# Patient Record
Sex: Male | Born: 1940 | Race: White | Hispanic: No | Smoking: Former smoker
Health system: Southern US, Community
[De-identification: ages and names within clinical notes are randomized; demographics above are authoritative.]

## PROBLEM LIST (undated history)

## (undated) DIAGNOSIS — G8929 Other chronic pain: Secondary | ICD-10-CM

## (undated) DIAGNOSIS — R002 Palpitations: Secondary | ICD-10-CM

## (undated) DIAGNOSIS — I219 Acute myocardial infarction, unspecified: Secondary | ICD-10-CM

## (undated) DIAGNOSIS — I495 Sick sinus syndrome: Secondary | ICD-10-CM

## (undated) DIAGNOSIS — I209 Angina pectoris, unspecified: Secondary | ICD-10-CM

## (undated) DIAGNOSIS — Z950 Presence of cardiac pacemaker: Secondary | ICD-10-CM

## (undated) DIAGNOSIS — N400 Enlarged prostate without lower urinary tract symptoms: Secondary | ICD-10-CM

## (undated) DIAGNOSIS — I10 Essential (primary) hypertension: Secondary | ICD-10-CM

## (undated) DIAGNOSIS — K589 Irritable bowel syndrome without diarrhea: Secondary | ICD-10-CM

## (undated) DIAGNOSIS — H269 Unspecified cataract: Secondary | ICD-10-CM

## (undated) DIAGNOSIS — N529 Male erectile dysfunction, unspecified: Secondary | ICD-10-CM

## (undated) DIAGNOSIS — E785 Hyperlipidemia, unspecified: Secondary | ICD-10-CM

## (undated) DIAGNOSIS — E65 Localized adiposity: Secondary | ICD-10-CM

## (undated) DIAGNOSIS — I4891 Unspecified atrial fibrillation: Secondary | ICD-10-CM

## (undated) DIAGNOSIS — K219 Gastro-esophageal reflux disease without esophagitis: Secondary | ICD-10-CM

## (undated) DIAGNOSIS — I499 Cardiac arrhythmia, unspecified: Secondary | ICD-10-CM

## (undated) DIAGNOSIS — Z862 Personal history of diseases of the blood and blood-forming organs and certain disorders involving the immune mechanism: Secondary | ICD-10-CM

## (undated) DIAGNOSIS — D649 Anemia, unspecified: Secondary | ICD-10-CM

## (undated) DIAGNOSIS — Z860100 Personal history of colon polyps, unspecified: Secondary | ICD-10-CM

## (undated) DIAGNOSIS — M549 Dorsalgia, unspecified: Secondary | ICD-10-CM

## (undated) DIAGNOSIS — M1990 Unspecified osteoarthritis, unspecified site: Secondary | ICD-10-CM

## (undated) DIAGNOSIS — I2510 Atherosclerotic heart disease of native coronary artery without angina pectoris: Secondary | ICD-10-CM

## (undated) HISTORY — DX: Unspecified atrial fibrillation: I48.91

## (undated) HISTORY — DX: Anemia, unspecified: D64.9

## (undated) HISTORY — DX: Benign prostatic hyperplasia without lower urinary tract symptoms: N40.0

## (undated) HISTORY — DX: Gastro-esophageal reflux disease without esophagitis: K21.9

## (undated) HISTORY — DX: Localized adiposity: E65

## (undated) HISTORY — DX: Personal history of diseases of the blood and blood-forming organs and certain disorders involving the immune mechanism: Z86.2

## (undated) HISTORY — DX: Personal history of colon polyps, unspecified: Z86.0100

## (undated) HISTORY — DX: Sick sinus syndrome: I49.5

## (undated) HISTORY — DX: Male erectile dysfunction, unspecified: N52.9

## (undated) HISTORY — DX: Irritable bowel syndrome, unspecified: K58.9

## (undated) HISTORY — DX: Essential (primary) hypertension: I10

## (undated) HISTORY — DX: Unspecified cataract: H26.9

## (undated) HISTORY — DX: Palpitations: R00.2

## (undated) HISTORY — DX: Atherosclerotic heart disease of native coronary artery without angina pectoris: I25.10

## (undated) HISTORY — PX: EYE SURGERY: SHX253

## (undated) HISTORY — DX: Hyperlipidemia, unspecified: E78.5

## (undated) HISTORY — DX: Dorsalgia, unspecified: M54.9

## (undated) HISTORY — DX: Other chronic pain: G89.29

## (undated) HISTORY — DX: Personal history of colonic polyps: Z86.010

## (undated) HISTORY — PX: CATARACT EXTRACTION W/ INTRAOCULAR LENS  IMPLANT, BILATERAL: SHX1307

---

## 1976-01-31 HISTORY — PX: CHOLECYSTECTOMY: SHX55

## 2003-01-31 HISTORY — PX: CORONARY ARTERY BYPASS GRAFT: SHX141

## 2003-01-31 HISTORY — PX: CARDIAC CATHETERIZATION: SHX172

## 2003-12-31 DIAGNOSIS — I2510 Atherosclerotic heart disease of native coronary artery without angina pectoris: Secondary | ICD-10-CM

## 2003-12-31 HISTORY — DX: Atherosclerotic heart disease of native coronary artery without angina pectoris: I25.10

## 2005-09-21 ENCOUNTER — Ambulatory Visit: Payer: Self-pay | Admitting: Internal Medicine

## 2006-01-12 ENCOUNTER — Ambulatory Visit: Payer: Self-pay | Admitting: Internal Medicine

## 2006-01-19 ENCOUNTER — Ambulatory Visit: Payer: Self-pay | Admitting: Internal Medicine

## 2006-03-13 ENCOUNTER — Ambulatory Visit: Payer: Self-pay | Admitting: Internal Medicine

## 2006-03-27 ENCOUNTER — Ambulatory Visit: Payer: Self-pay

## 2006-03-27 ENCOUNTER — Encounter: Payer: Self-pay | Admitting: Internal Medicine

## 2006-07-11 ENCOUNTER — Ambulatory Visit: Payer: Self-pay | Admitting: Cardiology

## 2006-07-12 ENCOUNTER — Ambulatory Visit: Payer: Self-pay

## 2006-07-30 ENCOUNTER — Ambulatory Visit: Payer: Self-pay | Admitting: Cardiology

## 2006-10-08 ENCOUNTER — Ambulatory Visit: Payer: Self-pay | Admitting: Internal Medicine

## 2007-03-25 ENCOUNTER — Ambulatory Visit: Payer: Self-pay | Admitting: Internal Medicine

## 2007-10-09 ENCOUNTER — Ambulatory Visit: Payer: Self-pay | Admitting: Internal Medicine

## 2007-12-18 ENCOUNTER — Encounter: Payer: Self-pay | Admitting: Physician Assistant

## 2007-12-18 ENCOUNTER — Ambulatory Visit: Payer: Self-pay | Admitting: Cardiology

## 2007-12-18 LAB — CONVERTED CEMR LAB
BUN: 23 mg/dL (ref 6–23)
Calcium: 9.3 mg/dL (ref 8.4–10.5)
Chloride: 106 meq/L (ref 96–112)
Creatinine, Ser: 1.2 mg/dL (ref 0.4–1.5)
GFR calc non Af Amer: 65 mL/min
TSH: 1.98 microintl units/mL (ref 0.35–5.50)

## 2008-01-01 ENCOUNTER — Ambulatory Visit: Payer: Self-pay | Admitting: Internal Medicine

## 2008-03-19 ENCOUNTER — Ambulatory Visit: Payer: Self-pay | Admitting: Internal Medicine

## 2008-07-17 ENCOUNTER — Telehealth: Payer: Self-pay | Admitting: Internal Medicine

## 2008-07-27 DIAGNOSIS — R002 Palpitations: Secondary | ICD-10-CM | POA: Insufficient documentation

## 2008-07-28 ENCOUNTER — Telehealth (INDEPENDENT_AMBULATORY_CARE_PROVIDER_SITE_OTHER): Payer: Self-pay | Admitting: *Deleted

## 2008-08-04 ENCOUNTER — Ambulatory Visit: Payer: Self-pay | Admitting: Internal Medicine

## 2008-10-14 ENCOUNTER — Telehealth: Payer: Self-pay | Admitting: Internal Medicine

## 2008-10-16 ENCOUNTER — Ambulatory Visit: Payer: Self-pay | Admitting: Internal Medicine

## 2008-10-16 ENCOUNTER — Encounter: Payer: Self-pay | Admitting: Internal Medicine

## 2008-10-16 DIAGNOSIS — I25110 Atherosclerotic heart disease of native coronary artery with unstable angina pectoris: Secondary | ICD-10-CM | POA: Insufficient documentation

## 2008-10-16 DIAGNOSIS — I10 Essential (primary) hypertension: Secondary | ICD-10-CM | POA: Insufficient documentation

## 2008-10-16 DIAGNOSIS — E7800 Pure hypercholesterolemia, unspecified: Secondary | ICD-10-CM

## 2008-10-26 ENCOUNTER — Ambulatory Visit: Payer: Self-pay

## 2008-10-26 ENCOUNTER — Encounter: Payer: Self-pay | Admitting: Internal Medicine

## 2009-01-04 ENCOUNTER — Ambulatory Visit: Payer: Self-pay | Admitting: Internal Medicine

## 2009-01-05 ENCOUNTER — Encounter: Payer: Self-pay | Admitting: Internal Medicine

## 2009-01-05 LAB — CONVERTED CEMR LAB
ALT: 42 units/L (ref 0–53)
BUN: 17 mg/dL (ref 6–23)
CO2: 28 meq/L (ref 19–32)
Calcium: 9.2 mg/dL (ref 8.4–10.5)
Chloride: 101 meq/L (ref 96–112)
Cholesterol: 157 mg/dL (ref 0–200)
Creatinine, Ser: 1.2 mg/dL (ref 0.4–1.5)
HDL: 37.7 mg/dL — ABNORMAL LOW (ref >39.00)
Total Bilirubin: 0.8 mg/dL (ref 0.3–1.2)
Total Protein: 7.8 g/dL (ref 6.0–8.3)
Triglycerides: 92 mg/dL (ref 0.0–149.0)

## 2009-06-22 ENCOUNTER — Ambulatory Visit: Payer: Self-pay | Admitting: Internal Medicine

## 2009-06-25 LAB — CONVERTED CEMR LAB
ALT: 35 units/L (ref 0–53)
Albumin: 4.3 g/dL (ref 3.5–5.2)
BUN: 18 mg/dL (ref 6–23)
CO2: 25 meq/L (ref 19–32)
Calcium: 9.1 mg/dL (ref 8.4–10.5)
Chloride: 104 meq/L (ref 96–112)
Cholesterol: 169 mg/dL (ref 0–200)
Creatinine, Ser: 1.1 mg/dL (ref 0.4–1.5)
Glucose, Bld: 74 mg/dL (ref 70–99)
Total CHOL/HDL Ratio: 5
Total Protein: 7.6 g/dL (ref 6.0–8.3)
Triglycerides: 129 mg/dL (ref 0.0–149.0)

## 2009-07-01 ENCOUNTER — Telehealth (INDEPENDENT_AMBULATORY_CARE_PROVIDER_SITE_OTHER): Payer: Self-pay | Admitting: *Deleted

## 2009-07-05 ENCOUNTER — Ambulatory Visit: Payer: Self-pay

## 2009-07-05 ENCOUNTER — Encounter: Payer: Self-pay | Admitting: Internal Medicine

## 2009-07-05 ENCOUNTER — Ambulatory Visit: Payer: Self-pay | Admitting: Internal Medicine

## 2009-07-05 ENCOUNTER — Encounter (HOSPITAL_COMMUNITY): Admission: RE | Admit: 2009-07-05 | Discharge: 2009-08-31 | Payer: Self-pay | Admitting: Internal Medicine

## 2009-07-07 ENCOUNTER — Telehealth: Payer: Self-pay | Admitting: Internal Medicine

## 2009-08-10 ENCOUNTER — Encounter: Payer: Self-pay | Admitting: Internal Medicine

## 2009-09-03 ENCOUNTER — Ambulatory Visit: Payer: Self-pay | Admitting: Internal Medicine

## 2009-09-07 LAB — CONVERTED CEMR LAB
Albumin: 4 g/dL (ref 3.5–5.2)
Alkaline Phosphatase: 60 units/L (ref 39–117)
BUN: 24 mg/dL — ABNORMAL HIGH (ref 6–23)
CO2: 31 meq/L (ref 19–32)
Chloride: 105 meq/L (ref 96–112)
Creatinine, Ser: 1 mg/dL (ref 0.4–1.5)
Glucose, Bld: 87 mg/dL (ref 70–99)
LDL Cholesterol: 54 mg/dL (ref 0–99)
Total Bilirubin: 0.6 mg/dL (ref 0.3–1.2)
Total CHOL/HDL Ratio: 3
Triglycerides: 48 mg/dL (ref 0.0–149.0)

## 2009-09-10 ENCOUNTER — Ambulatory Visit: Payer: Self-pay | Admitting: Internal Medicine

## 2009-09-16 LAB — CONVERTED CEMR LAB
CO2: 27 meq/L (ref 19–32)
Chloride: 104 meq/L (ref 96–112)
Creatinine, Ser: 1.1 mg/dL (ref 0.4–1.5)

## 2010-01-11 ENCOUNTER — Ambulatory Visit: Payer: Self-pay | Admitting: Internal Medicine

## 2010-01-11 ENCOUNTER — Encounter: Payer: Self-pay | Admitting: Internal Medicine

## 2010-02-28 ENCOUNTER — Telehealth: Payer: Self-pay | Admitting: Internal Medicine

## 2010-03-01 NOTE — Letter (Signed)
Summary: lipid reminder  [facility], Main Office  [address]   [place], [place] [address]   Phone: [phone]  Fax: [phone]        August 10, 2009 MRN: [id number]    Dylan Roth [address] [place], [place]  [address]    Dear [name],  Our records indicate it is time to check your cholesterol.  Please call our office to schedule an appt for labwork.  Please remember it is a fasting lab.     Sincerely,  Meredith Staggers, RN Arvilla Meres, MD  This letter has been electronically signed by your physician.

## 2010-03-01 NOTE — Progress Notes (Signed)
Summary: Nuclear Pre-Procedure  Phone Note Outgoing Call   Call placed by: Milana Na, EMT-P,  July 01, 2009 1:50 PM Summary of Call: Reviewed information on Myoview Information Sheet (see scanned document for further details).  Spoke with patient's wife.     Nuclear Med Background Indications for Stress Test: Evaluation for Ischemia   History: CABG, Echo, Myocardial Infarction, Myocardial Perfusion Study   Symptoms: Palpitations    Nuclear Pre-Procedure Cardiac Risk Factors: Hypertension, Lipids Height (in): 67  Nuclear Med Study Referring MD:  D.Bensimhon

## 2010-03-01 NOTE — Assessment & Plan Note (Signed)
Summary: Cardiology Nuclear Study  Nuclear Med Background Indications for Stress Test: Evaluation for Ischemia   History: CABG, Echo, Myocardial Infarction, Myocardial Perfusion Study   Symptoms: Palpitations    Nuclear Pre-Procedure Cardiac Risk Factors: Hypertension, Lipids Caffeine/Decaff Intake: None NPO After: 7:30 AM Lungs: clear IV 0.9% NS with Angio Cath: 20g     IV Site: (L) AC IV Started by: Irean Hong RN Chest Size (in) 44     Height (in): 67 Weight (lb): 195 BMI: 30.65 Tech Comments: Coreg 10pm yesterday.  Nuclear Med Study 1 or 2 day study:  1 day     Stress Test Type:  Eugenie Birks Reading MD:  Dietrich Pates, MD     Referring MD:  D.Bensimhon Resting Radionuclide:  Technetium 3m Tetrofosmin     Resting Radionuclide Dose:  11 mCi  Stress Radionuclide:  Technetium 59m Tetrofosmin     Stress Radionuclide Dose:  33 mCi   Stress Protocol   Lexiscan: 0.4 mg   Stress Test Technologist:  Milana Na EMT-P     Nuclear Technologist:  Domenic Polite CNMT  Rest Procedure  Myocardial perfusion imaging was performed at rest 45 minutes following the intravenous administration of Myoview Technetium 32m Tetrofosmin.  Stress Procedure  The patient received IV Lexiscan 0.4 mg over 15-seconds.  Myoview injected at 30-seconds.  There were no significant changes with infusion.  Quantitative spect images were obtained after a 45 minute delay.  QPS Raw Data Images:  Soft tissue (diaphragm, subcutaneous fat) surround heart. Stress Images:  There is normal uptake in all areas. Rest Images:  Normal homogeneous uptake in all areas of the myocardium. Subtraction (SDS):  No evidence of ischemia. Transient Ischemic Dilatation:  .92  (Normal <1.22)  Lung/Heart Ratio:  .3  (Normal <0.45)  Quantitative Gated Spect Images QGS EDV:  79 ml QGS ESV:  24 ml QGS EF:  69 %   Overall Impression  Exercise Capacity: Lexiscan protocol BP Response: Normal blood pressure  response. Clinical Symptoms: No chest pain ECG Impression: No significant ST segment change suggestive of ischemia. Overall Impression: Normal stress nuclear study.  Appended Document: Cardiology Nuclear Study ok  Appended Document: Cardiology Nuclear Study Left message to call back   Appended Document: Cardiology Nuclear Study pt aware

## 2010-03-01 NOTE — Assessment & Plan Note (Signed)
Summary: rov. gd   Visit Type:  Follow-up Primary Provider:  Joetta Manners  CC:  some back pain again.  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  He had a Myoview in June 2008  which showed an EF of 69%.  No evidence of ischemia.   Saw [name] in 9/10 for palpitations. Echo showed EF 60-65% mild AI/TR. Reassured that PACs and PVCs on previous monitor were benign.   Returns today for routine f/u. Continues to have intermittent palpiations. Nothing prolonged describes them as "a real quick flutter." No syncope or presyncope. Can wake up with palpitations but gets up and geos back to bed. Wife denies that he snores or has any witnessed apnea.   Fairly activewith yardwork but limited by low back pain. Does not walk routinely. No CP /SOB. +arthritis pain. No daytime fatigue. Had back pain when he first had his heart problems so he is a bit concerned about it.  Most recent LDL 101 (12/10)  Current Medications (verified): 1)  Carvedilol 12.5 Mg Tabs (Carvedilol) .... Take 1 Tablet By Mouth Twice A Day 2)  Lisinopril 20 Mg Tabs (Lisinopril) .... Take 1 Tablet By Mouth Once A Day 3)  Viagra 50 Mg Tabs (Sildenafil Citrate) .... Take One Tab By Mouth Once Daily As Needed 4)  Allopurinol 300 Mg Tabs (Allopurinol) .... Take 1 Tablet By Mouth Once A Day 5)  Flomax 0.4 Mg Caps (Tamsulosin Hcl) .... Take 1 Capsule By Mouth Once A Day 6)  Simvastatin 40 Mg Tabs (Simvastatin) .... Take One Tablet By Mouth At Bedtime Occasionally 7)  Nitrostat 0.4 Mg Subl (Nitroglycerin) .... Uad  Allergies (verified): No Known Drug Allergies  Past History:  Past Medical History: Last updated: 01/04/2009 Atrial Fibrillation, postop CAD, MI Dec 2005 - s/p  CABG Palpitations - PACs/PVCs on monitor Hyperlipidemia Hypertension Central obesity Gout BPH Gall stones Chronic back and shoulder pain  Review of Systems       As per HPI and past medical history; otherwise all systems negative.   Vital Signs:  Patient profile:   70 year old male Height:      67 inches Weight:      196 pounds BMI:     30.81 Pulse rate:   55 / minute BP sitting:   118 / 70  (left arm) Cuff size:   regular  Vitals Entered By: Hardin Negus, RMA (Jun 22, 2009 10:27 AM)  Physical Exam  General:  Well appearing. no resp difficulty HEENT: normal Neck: thick neck. supple. no JVD. Carotids 2+ bilat; no bruits.  Cor: PMI nondisplaced. Regular rate & rhythm. No rubs, gallops, murmur. Lungs: clear Abdomen: soft, nontender, nondistended. No hepatosplenomegaly. No bruits or masses. Good bowel sounds. Extremities: no cyanosis, clubbing, rash, edema Neuro: alert & orientedx3, cranial nerves grossly intact. moves all 4 extremities w/o difficulty. affect pleasant    Impression & Recommendations:  Problem # 1:  CORONARY ATHEROSCLEROSIS, NATIVE VESSEL (ICD-414.01) Doing farily well. He is 3 yrs out since last stress test. Given his symptoms and concern will proceed with f/u Myvoiew. Suggested routine exercise program with stationary bike 30-40 mins 4x week as he is unable to walk well due to back pain.  Orders: Nuclear Stress Test (Nuc Stress Test) TLB-BMP (Basic Metabolic  Panel-BMET) (80048-METABOL) TLB-Hepatic/Liver Function Pnl (80076-HEPATIC) TLB-Lipid Panel (80061-LIPID)  Problem # 2:  HYPERCHOLESTEROLEMIA (ICD-272.0) LDL goal < 70. Recheck lipids today. Titrate statin as needed.   Problem # 3:  ESSENTIAL HYPERTENSION, BENIGN (ICD-401.1)  Blood pressure well controlled. Continue current regimen.  His updated medication list for this problem includes:    Carvedilol 12.5 Mg Tabs (Carvedilol) .Marland Kitchen... Take 1 tablet by mouth twice a day    Lisinopril 20 Mg  Tabs (Lisinopril) .Marland Kitchen... Take 1 tablet by mouth once a day  Problem # 4:  PALPITATIONS (ICD-785.1)  Reassured him that these were likely benign.   His updated medication list for this problem includes:    Carvedilol 12.5 Mg Tabs (Carvedilol) .Marland Kitchen... Take 1 tablet by mouth twice a day    Lisinopril 20 Mg Tabs (Lisinopril) .Marland Kitchen... Take 1 tablet by mouth once a day    Nitrostat 0.4 Mg Subl (Nitroglycerin) ..... Uad  Other Orders: EKG w/ Interpretation (93000)  Patient Instructions: 1)  Labs today 2)  Your physician has requested that you have a Lexiscan myoview.  For further information please visit [email or web address]  Please follow instruction sheet, as given. 3)  Follow up in 9 months

## 2010-03-01 NOTE — Progress Notes (Signed)
Summary: returning call  Phone Note Call from Patient Call back at Home Phone [phone]   Reason for Call: Talk to Nurse Summary of Call: returning call Initial call taken by: Migdalia Dk,  July 07, 2009 3:16 PM  Follow-up for Phone Call        pt aware of results Follow-up by: Meredith Staggers, RN,  July 08, 2009 10:19 AM

## 2010-03-03 LAB — CONVERTED CEMR LAB
AST: 29 units/L (ref 0–37)
Albumin: 4 g/dL (ref 3.5–5.2)
BUN: 17 mg/dL (ref 6–23)
Calcium: 9 mg/dL (ref 8.4–10.5)
Cholesterol: 152 mg/dL (ref 0–200)
GFR calc non Af Amer: 77.2 mL/min (ref >60.00)
Glucose, Bld: 85 mg/dL (ref 70–99)
HDL: 30.2 mg/dL — ABNORMAL LOW (ref >39.00)
LDL Cholesterol: 106 mg/dL — ABNORMAL HIGH (ref 0–99)
Potassium: 4.9 meq/L (ref 3.5–5.1)
Sodium: 139 meq/L (ref 135–145)
Total Bilirubin: 0.5 mg/dL (ref 0.3–1.2)
VLDL: 15.8 mg/dL (ref 0.0–40.0)

## 2010-03-03 NOTE — Assessment & Plan Note (Signed)
Summary: ROV. GD   Visit Type:  Follow-up Primary Provider:  Joetta Manners  CC:  palpitations.  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  He had a Myoview in June 2008  which showed an EF of 69%.  No evidence of ischemia.   Saw [name] in 9/10 for palpitations. Echo showed EF 60-65% mild AI/TR. Reassured that PACs and PVCs on previous monitor were benign.   Stress Myoview 6/11 EF normal. Normal perfusion.   At last visit HDL low (26). Started fish oil 2g. Also K+ high at 5.7. He cut back on OJ and bananas.   In past lisinopril increased to 20 but was getting lightheaded but cut back to 15 once daily. Now feels beeter SBP 110-120 at home.   Returns today for routine f/u. Recently stopped drinking completely and feels much better. Still with occasional palptiations but much better after stopping ETOH. No significant CP.   Continues to have intermittent palpiations. Nothing prolonged describes them as "a real quick flutter." No syncope or presyncope.  Fairly active with yardwork but limited by low back pain. Does not walk routinely. No CP /SOB. +arthritis pain.   Current Medications (verified): 1)  Carvedilol 12.5 Mg Tabs (Carvedilol) .... Take 1 Tablet By Mouth Twice A Day 2)  Lisinopril 20 Mg Tabs (Lisinopril) .... Take 1/2 Tablet By Mouth Once A Day 3)  Viagra 50 Mg Tabs (Sildenafil Citrate) .... Take One Tab By Mouth Once Daily As Needed 4)  Allopurinol 300 Mg Tabs (Allopurinol) .... Take 1 Tablet By Mouth Once A Day 5)  Flomax 0.4 Mg Caps (Tamsulosin Hcl) .... Take 1 Capsule By Mouth Once A Day 6)  Simvastatin 40 Mg Tabs (Simvastatin) .... Take One Tablet By Mouth At Bedtime Occasionally 7)  Nitrostat 0.4 Mg Subl (Nitroglycerin)  .... Uad 8)  Fish Oil 1000 Mg Caps (Omega-3 Fatty Acids) .... 2 Tabs Daily  Allergies (verified): No Known Drug Allergies  Review of Systems       As per HPI and past medical history; otherwise all systems negative.   Vital Signs:  Patient profile:   70 year old male Height:      67 inches Weight:      199 pounds BMI:     31.28 Pulse rate:   55 / minute BP sitting:   138 / 82  (left arm) Cuff size:   regular  Vitals Entered By: Hardin Negus, RMA (January 11, 2010 10:28 AM)   Impression & Recommendations:  Problem # 1:  CORONARY ATHEROSCLEROSIS, NATIVE VESSEL (ICD-414.01) Roth. No evidence of ischemia on recent stress test. Encouraged him to get an exercise bike and start a routine exercise program.   Problem # 2:  ESSENTIAL HYPERTENSION, BENIGN (ICD-401.1) Mildly elevated here but wellcontrolled at home. Continue current regimen.   Problem # 3:  HYPERCHOLESTEROLEMIA (ICD-272.0) LDL at goal. HDL very low. Continue statin and fish oil. Will likely need to titrate fish oil up. Needs exercise.   Problem # 4:  Hyperkalemia Recheck today. If signicantly elevated may need to stop lisinopril.   Other Orders: EKG w/ Interpretation (93000) TLB-BMP (Basic Metabolic Panel-BMET) (80048-METABOL) TLB-Hepatic/Liver Function Pnl (80076-HEPATIC) TLB-Lipid Panel (80061-LIPID)  Patient Instructions: 1)  Labs today 2)  Your physician wants you to follow-up  in:  6 months.  You will receive a reminder letter in the mail two months in advance. If you don't receive a letter, please call our office to schedule the follow-up appointment.

## 2010-03-09 NOTE — Progress Notes (Signed)
Summary: pt still having palpitations off and on  Phone Note Call from Patient   Caller: Patient [phone] Reason for Call: Talk to Nurse Summary of Call: pt having palpitations off and on , has called re this a few times, wanted to call and let us know still having them, he does what he was told to do and they do go away-req a call Initial call taken by: Glynda Jaeger,  February 28, 2010 9:07 AM  Follow-up for Phone Call        I spoke with the pt and offered reassurance about palpitations. This has been an ongoing issue for the pt.  The pt does get scared when he has palps.  The pt has cut out ETOH but still consumes caffiene.  The pt drinks 2-3 cups of coffee every morning and a soft drink.  I recommended that the pt cut out caffiene or decrease intake to help with palps.  Pt agreed with plan.     Follow-up by: Julieta Gutting, RN, BSN,  February 28, 2010 9:15 AM

## 2010-06-14 NOTE — Assessment & Plan Note (Signed)
[facility]                            CARDIOLOGY OFFICE NOTE   Dylan, Roth                      MRN:          [id number]  DATE:07/11/2006                            DOB:          1940/09/25    This is a patient of Dr. Gala Romney.   Dylan Roth is a pleasant 70 year old married white male patient with  history of coronary artery disease status post non ST elevation  myocardial infarction in December 2005, followed by CABG times 5 with a  LIMA to the LAD, SVG to the diagonal, SVG to the ramus, SVG to the OM,  SVG to the RCA. He had postop atrial fibrillation treated with sotalol  briefly. This all was done in [place], [place] and he moved to  [place] so they could be closer to their daughters living in [place].  His last stress test was in 2006 at which time  was negative.   The patient presents with 1 week history of exertional back and left  shoulder pain. Prior to his bypass surgery the patient developed pain in  his back when walking his dog that he initially thought was arthritis  pain but turned out to be cardiac. He has significant arthritis and a  frozen left shoulder that aches all the time. It is very hard for him to  distinguish his arthritis pain and cardiac pain. He says he has consent  aching in his left shoulder and shoulder blade area. When his wife  touches his left shoulder blade it is very painful. The only significant  difference is when he is walking he has tightness in his back and left  shoulder area gets a little worse. He does not have to stop walking and  is able to continue on unlike prior to his bypass. He denies any chest  pain, palpitations, dizziness, or pre syncope. He does get a little out  of breath but he contributes this to his weight gain and is not  significantly bothered by it. He can mow his grass without any symptoms  and remains quite active.   ALLERGIES:  No known drug allergies.   CURRENT MEDICATIONS:  1. Flomax 0.4 mg daily.  2. Allopurinol 300 mg daily.  3. Zocor 20 mg daily.  4. Aspirin 81 mg daily.  5. Coreg 2.5 mg b.i.d.  6. Lisinopril 20 mg daily.  7. Mobic p.r.n.   PHYSICAL EXAMINATION:  This is a very pleasant 70 year old white male in  no acute distress. Blood pressure 122/76, pulse 62, weight 205.  NECK: Without JVD, HJR, bruits, or thyroid enlargement.  LUNGS: Clear anterior, and posterior, and lateral.  HEART: Regular rate and rhythm at 62 beats per minute. Normal S1 and S2.  No murmur, rub, bruits, thrill, or heave noted.  ABDOMEN: Soft without organomegaly, masses, lesions, or abnormal  tenderness.  EXTREMITIES: Without cyanosis, clubbing, or edema. He has good distal  pulses.   IMPRESSION:  1. Left shoulder and back pain, rule out anginal equivalent.  2. Coronary artery disease status post non ST elevation myocardial      infarction in December 2005  followed by CABG times 5 with normal      left ventricular function.  3. Postop atrial fibrillation, treated with sotalol briefly.  4. Central obesity.  5. Hypertension well controlled.  6. Hyperlipidemia treated with Zocor.  7. Gout.  8. Benign prostatic hypertrophy.  9. Status post cholecystectomy.  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   PLAN:  At this time, after a long discussion the patient will have a  stress Cardiolite to rule out ischemia. It is very hard to distinguish  his cardiac and arthritis pain. I have encouraged him to see an  orthopedist for his frozen shoulder. He has claustrophobia and MRI has  been recommended in the past but he says he can not even go into the  open MRI despite having medication. He is to see Dr. Gala Romney after the  stress test this week.      Dylan Reedy, PA-C  Electronically Signed      Dylan Rotunda, MD, [facility]  Electronically Signed   ML/MedQ  DD: 07/11/2006  DT: 07/11/2006  Job #: [id number]   cc:   Dylan Roth, M.D.

## 2010-06-14 NOTE — Assessment & Plan Note (Signed)
[facility]                            CARDIOLOGY OFFICE NOTE   Dylan Roth, Dylan Roth                      MRN:          [id number]  DATE:10/08/2006                            DOB:          [birth year]    PRIMARY CARE PHYSICIAN:  Dylan Roth, M.D.   INTERVAL HISTORY:  Dylan Roth is a very pleasant, 70 year old male with  a history of coronary artery disease status post previous bypass surgery  in 2005 who returns today for routine followup. 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   Looking back through his records, he had a Myoview in June 2008 which  showed an EF of 69% with no evidence of ischemia. Overall he is doing  well. He does continue to have some shoulder and back pain with an  occasional quivering in his chest. He says these symptoms are worse if  he is really pushing himself. He denies any prolonged episodes, he  denies any syncope or presyncope. He does say he tries to walk around  for 20 minutes to half an hour a day and with this activity he does not  have problems.   CURRENT MEDICATIONS:  1. Flomax 0.4 a day.  2. Allopurinol 300 a day.  3. Zocor 20 a day.  4. Aspirin 81 a day.  5. Coreg 6.25 a day.  6. Lisinopril 20 a day.  7. Mobic p.r.n.   PHYSICAL EXAMINATION:  GENERAL:  He is in no acute distress. He  ambulates around the clinic without any respiratory difficulty.  VITAL SIGNS:  Blood pressure initially 120/80, a manual recheck 132/80,  heart rate 52, weight is 205.  HEENT:  Normal.  NECK:  Supple with no JVD. Carotids are 2+ bilaterally without any  bruits. There is no lymphadenopathy or thyromegaly.  CARDIAC:  His PMI is nondisplaced. He is bradycardic and regular. No  obvious murmur.  LUNGS:  Clear.  ABDOMEN:  Obese, soft, nontender, nondistended, no hepatosplenomegaly,  no bruits, no masses, good bowel sounds.  EXTREMITIES:  Warm  with no cyanosis, clubbing or edema, no rash, good  pulses.   ASSESSMENT/PLAN:  1. Coronary artery disease status post previous bypass surgery. He      does have some chronic atypical pain but given his Myoview I think      is probably not representative of angina and would just continue      his current therapy. I did suggest to him that he should get more      exercise.  2. Hypertension. Blood pressures here are mildly elevated but he says      they are very well controlled in other situations. Continue current      therapy.  3. Hyperlipidemia. Continue Zocor. This will be followed by his      primary care physician. Need to keep his LDL below 70.  4. Alcohol use. He is currently drinking essentially a pint a week. I      told him to try to limit his alcoholic drinks to  1-2 a night.   DISPOSITION:  Will see him back in 6-9 months for routine followup.     Dylan Buckles. Bensimhon, MD  Electronically Signed    DRB/MedQ  DD: 10/08/2006  DT: 10/09/2006  Job #: [id number]   cc:   Dylan Roth, M.D.

## 2010-06-14 NOTE — Assessment & Plan Note (Signed)
[facility]                            CARDIOLOGY OFFICE NOTE   AAMIR, MCLINDEN                      MRN:          [id number]  DATE:03/25/2007                            DOB:          06-Apr-1940    PRIMARY CARE PHYSICIAN:  Raynelle Jan, M.D.   HISTORY:  [name] is a very pleasant 70 year old male with a history  of coronary disease status post bypass surgery in 2005 who returns  routine follow-up.  Past medical history is also notable for  hypertension, hyperlipidemia, chronic back and shoulder pain with a  frozen left shoulder.   He had a Myoview in June 2008 which showed an EF of 69%.  He presents  today for an unscheduled visit. He has been working in the yard a lot  with a chain saw and other activities cutting wood.  He says that he now  feels soreness in his chest.  It is worse when he rotates his left  shoulder.  This often happens at night and he rolls over and feels  better.  He says he has not had any shortness of breath or experienced  any change in his functional capacity but was somewhat worried about the  pain.  He has had a problem the past with costochondritis.   CURRENT MEDICATIONS:  Flomax 0.4 day, allopurinol 300 a day, Zocor 20 a  day, aspirin 81 a day, lisinopril 20 a day, Coreg 9.375 b.i.d., Mobic  p.r.n. and Viagra p.r.n.   INTERVAL HISTORY:  He says the pain feels much better when he takes  Aleve.  He also says it feels better when he drinks his 2 large  highballs at night.   EXAM:  He is in no acute distress amateur clinic without respiratory  difficulty.  Blood pressure is 130/90, heart rate 53 weights 203.  HEENT is normal.  Neck is supple.  There is no JVD.  Carotid 2+ bilateral bruits.  There  is no lymphadenopathy or thyromegaly.  CARDIAC:  He had PMI is nondisplaced.  He is bradycardic and regular.  No obvious murmur.  He is tender palpation over the left sternal border  and left chest.  This  reproduces his symptoms.  LUNGS:  Clear.  ABDOMEN:  Obese, soft, nontender, nondistended hepatosplenomegaly, no  bruits, no masses.  Good bowel sounds.  EXTREMITIES:  Warm with no cyanosis, clubbing or edema.  No rash.  Good  pulses.  NEURO:  Alert and x3.  Cranial nerves II-XII are intact.  Moves all  fours extremities without difficulty.  Affect is pleasant.   EKG shows sinus bradycardia rate of 53 with a first-degree AV block into  her 20 milliseconds no ST-T wave abnormalities.   ASSESSMENT/PLAN:  1. Chest pain.  I suspect this is musculoskeletal in nature.  However,      given his history we did review the options of performing another      stress test or cardiac catheterization.  He agrees that this is      likely musculoskeletal we are going to try a course of conservative  therapy with 1000 mg of Tylenol a day and NSAID as needed.  Should      his pain be getting worse, he will get back in touch with [place].  I      told him not to take any nitroglycerin, as he is using Viagra the      same time.  2. Hypertension.  Blood pressure is mildly elevated.  He says it is      usually better at home.  He will keep a blood pressure log and      follow up with Dr. Carolyne Fiscal.   DISPOSITION:  Will see him back for routine follow-up in several months.     [name], MD  Electronically Signed    DRB/MedQ  DD: 03/25/2007  DT: 03/26/2007  Job #: [id number]   cc:   Raynelle Jan, M.D.

## 2010-06-14 NOTE — Assessment & Plan Note (Signed)
[address]                            CARDIOLOGY OFFICE NOTE   Dylan Roth, Dylan Roth                      MRN:          [id number]  DATE:07/30/2006                            DOB:          [birth year]    PRIMARY CARDIOLOGIST:  Dr. Nicholes Mango.   Dylan Roth is a 70 year old male with a history of coronary artery  disease who is seen today in followup. He was seen on July 11, 2006 and  was talking about some back pain, as well as sharp chest pain. These  pains are very brief. In the interim he had a stress test as well as  some x-rays and is here today to follow up on the stress test.   He has continued to have the sharp chest pains that are associated with  increased emotional stress or physical stress, as well as back pain. 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 He is to follow up with his  primary care physician for this. He states that he has not had any pain  that lasted long enough for him to take nitroglycerin which he was  prescribed on his last visit. He states that the symptoms have not  changed any since he was here last.   CURRENT MEDICATIONS:  1. Flomax 0.4 mg daily.  2. Allopurinol 300 mg daily.  3. Zocor 20 mg daily.  4. Aspirin 81 mg daily.  5. Coreg 6.25 mg 1.5 tablets b.i.d.  6. Lisinopril 20 mg daily.  7. Mobic daily p.r.n. (has not taken recently).   PHYSICAL EXAMINATION:  VITAL SIGNS: His blood pressure is 161/98, and  then 154/90 on repeat. His weight is 205 pounds, heart rate is 65. His  weight is unchanged from his previous office visit.  NECK: Without JVD, bruits, or thyroid enlargement.  LUNGS: Clear to auscultation bilaterally.  HEART: Regular rate and rhythm with an S1, S2, and a soft systolic  murmur is noted at the base.  ABDOMEN: Soft and nontender with active bowel sounds.  EXTREMITIES: Without cyanosis, clubbing, or edema, and  distal pulses are  intact.   Myoview performed on July 12, 2006 showed normal contractility, and  thickening with an ejection fraction of 59% , and normal up take on rest  and stress, therefore no ischemia.   IMPRESSION:  1. Chest and back pain: Dylan Roth is advised that if he wishes      further evaluation we can do a heart catheterization. However, I      advised him that since his bypass surgery was in 2005, and his      Myoview was without scar or ischemia, and his symptoms are very,      very brief, it is less likely to be cardiac in origin, and he      should continue to follow up with his primary care physician on      other causes of his pain.  2. Hypertension: Dylan Roth states that he is compliant with his home  medications. He states that at home his systolic blood pressure is      generally 120 to 130. He feels that he may possibly have an      increased blood pressure response to stress which he feels whenever      he comes to the doctor, or gets in a stressful situation. He is      advised to continue his blood pressure readings and bring them with      him when he comes to see Dr. Jones Broom in 2 months.  3. Cardiac risk factors: Dylan Roth does not exercise regularly. He      states he is active around the house, and the yard, and he was      encouraged to exercise regularly with an exertion level of 5 or 6,      and try to start at 10 minutes and build up to 30 minutes a day. He      is also advised to eat a heart healthy diet and to limit his ETOH      to 2 ounces daily. He is to continue on his other home medications      and follow up with Dr. Gala Romney in 2 months.      Theodore Demark, PA-C  Electronically Signed      [name], MD, [facility]  Electronically Signed   RB/MedQ  DD: 07/30/2006  DT: 07/31/2006  Job #: [id number]

## 2010-06-14 NOTE — Assessment & Plan Note (Signed)
[facility]ALTHCARE                            CARDIOLOGY OFFICE NOTE   ELLINGTON, GREENSLADE                      MRN:          [id number]  DATE:12/18/2007                            DOB:          [birth year]    The patient was seen at [facility] on December 18, 2007, for Dr.  Shirlee Latch.   PRIMARY CARDIOLOGIST:  [name], MD.   PRIMARY CARE PHYSICIAN:  Raynelle Jan, MD   [name] is a pleasant 70 year old white male patient with a history  of coronary artery disease status post CABG in 2005.  He had a negative  Myoview in June 2008 with an EF of 69%.  No ischemia.  He also has  hypertension, hyperlipidemia, and obesity.   The patient comes today because of several-week history of palpitations.  He states he would have a couple of mixed drinks before dinner in the  evening and then he do awakened at 1:00 a.m. with his heart skipping.  He had hard time describing it and did not know if it was racing or not.  He said he would eventually fall back to sleep and could not tell me how  long it lasted.  If he got upset during the day, they would also start  and if he did deep breathing, it would ease.  He does not drink any  caffeine.  Over the past week, he completely stopped alcohol and he  states his palpitations have improved a 100%.  He has not had any  further symptoms.  He was instructed when he called in to increase his  carvedilol to 12.5 mg b.i.d., but he actually increased to 6.25 mg.  He  started taking at one-half times twice a day.  He had skipped evening  doses when he was drinking alcohol, because he thought it may interfere  with the medications.  He denies any chest pain, tightness, pressure,  dyspnea, dyspnea on exertion, diaphoresis, dizziness, or presyncope.  The patient works in his yard all day along without any symptoms.   CURRENT MEDICATIONS:  1. Flomax 0.4 mg daily.  2. Allopurinol 300 mg daily.  3. Aspirin 81 mg  daily.  4. Lisinopril 20 mg daily.  5. Carvedilol 6.25 mg one-half b.i.d.  6. Zocor 80 mg daily.   PHYSICAL EXAMINATION:  GENERAL:  This is a very pleasant 70 year old  white male in no acute distress.  VITAL SIGNS:  Blood pressure 140/92.  When I retook, it was 130/80.  He  states blood pressure generally at home runs 117/69, pulse 64, and  weight 197.  NECK:  Without JVD, HJR, bruit, or thyroid enlargement.  LUNGS:  Clear; anterior, posterior, and lateral.  HEART:  Regular rate and rhythm at 64 beats per minute.  Normal S1 and  S2.  No murmur, rub, bruit, thrill, or heave noted.  Quiet heart sounds.  ABDOMEN:  Obese.  Normoactive bowel sounds are heard throughout.  No  organomegaly, masses, lesions, or abnormal tenderness.  EXTREMITIES:  Without cyanosis, clubbing, or edema.  He has good distal  pulses.   EKG; normal  sinus rhythm with first-degree AV block, otherwise normal.  No acute change.   IMPRESSION:  1. Palpitations, resolved after stop drinking alcohol and increased      his carvedilol.  2. Coronary artery disease status post coronary artery bypass graft in      2005 with negative Myoview in June 2008, ejection fraction 69%.  3. Hypertension.  4. Hyperlipidemia.  5. Obesity.  6. Chronic back and shoulder pain.  7. History of erectile dysfunction.  He is on Viagra.  8. History of gout.  9. Benign prostatic hypertrophy.  10.Status post cholecystectomy.   PLAN:  I have increased his carvedilol to 12.5 mg b.i.d. and send a new  prescription via e-prescribed.  We will check a BMET and TSH today.  Because his palpitations have resolved with stopping the alcohol, I will  not order any further test today.  I told him if he has any further  palpitations that we would place an event recorder on him and he is to  call if this occurs.  He will see Dr. Gala Romney back in 6 months or  sooner if needed.      Jacolyn Reedy, PA-C  Electronically Signed      Marca Ancona,  MD  Electronically Signed   ML/MedQ  DD: 12/18/2007  DT: 12/18/2007  Job #: [id number]   cc:   Raynelle Jan, M.D.

## 2010-06-14 NOTE — Assessment & Plan Note (Signed)
[facility]ALTHCARE                            CARDIOLOGY OFFICE NOTE   DAIVEON, MARKMAN                      MRN:          [id number]  DATE:10/09/2007                            DOB:          [birth year]    PRIMARY CARE PHYSICIAN:  Raynelle Jan, MD   INTERVAL HISTORY:  Dylan Roth is a very pleasant 70 year old male with a  history of coronary artery disease status post bypass surgery in 2005  also has a history of hypertension, hyperlipidemia, obesity, chronic  back and shoulder pain with a frozen left shoulder.  He had a Myoview in  June 2008  which showed an EF of 69%.  No evidence of ischemia.   He returns today for routine followup.  He is doing very well.  He has  been working hard out in the yard, Education officer, environmental and doing other  landscaping without any chest pain or dyspnea.  He continues to use  Viagra for erectile dysfunction.  Occasionally, he spots with it, but  this has not gotten any worse.  He has not had any chest pain.  He has  been taking his blood pressure at home routinely and systolic's have all  been under 130.  He did say on his way up here today he got an argument  with someone about a price of a tree and feels like that is why his  blood pressure is elevated.   CURRENT MEDICATIONS:  1. Flomax 0.4 a day.  2. Allopurinol 300 a day.  3. Aspirin 81 a day.  4. Lisinopril 20 a day.  5. Carvedilol 6.25 once a day.  6. Zocor 40 a day.   PHYSICAL EXAMINATION:  GENERAL:  He is in no acute distress, ambulatory  around the clinic without any respiratory difficulty.  VITAL SIGNS:  Blood pressure is 158/92, heart rate 60, weight is 198.  HEENT:  Normal.  NECK:  Supple.  No JVD.  Carotid are 2+ bilaterally without bruits.  There is no lymphadenopathy or thyromegaly.  CARDIAC:  PMI is nondisplaced.  He has regular rate and rhythm.  No  murmur, rubs, or gallops.  LUNGS:  Clear.  ABDOMEN:  Soft, nontender, and nondistended.  No  hepatosplenomegaly, no  bruits, no masses appreciated.  Good bowel sounds.  EXTREMITIES:  Warm with no cyanosis, clubbing, or edema.  He is unable  to lift his left arm up over his head.  NEUROLOGIC:  Alert and oriented x3.  Cranial nerves II through XII are  intact.  Affect is pleasant.   EKG is sinus rhythm with a first-degree AV block, 224 milliseconds.  No  ST-T, there is T-wave flattening in aVL.   ASSESSMENT/PLAN:  1. Coronary artery disease.  He is doing quite well.  No evidence of      ischemia.  Continue current therapy.  2. Hypertension.  Blood pressure is elevated here, but has been well      controlled at home.  We will keep him wear his hat.  3. Hyperlipidemia.  Goal LDL is less than 70.  I will give him  prescription to get his lipids checked and fax them to [place].  4. Erectile dysfunction.  Continue Viagra.  I did have a talk with him      about the need to call 911 should he develop significant chest pain      while using Viagra.     [name], MD  Electronically Signed    DRB/MedQ  DD: 10/09/2007  DT: 10/10/2007  Job #: [id number]   cc:   Raynelle Jan, M.D.

## 2010-06-14 NOTE — Assessment & Plan Note (Signed)
[facility]                            CARDIOLOGY OFFICE NOTE   NICKLAUS, ALVIAR                      MRN:          [id number]  DATE:03/19/2008                            DOB:          04/28/1940    PRIMARY CARE PHYSICIAN:  Raynelle Jan, MD   INTERVAL HISTORY:  Mr. Wager is a very pleasant 70 year old male with  history of coronary artery disease, status post bypass surgery in 2005,  who returns for routine followup.  Past medical history is also notable  for hypertension, hyperlipidemia, chronic back and shoulder pain with  frozen left shoulder.  Unfortunately, he has been intolerant of MRIs as  he was stuck in a pipe for 8 hours as a kid.   He did have a Myoview in June 2008 which showed an EF of 69% with no  ischemia.   He presents today for routine followup.  He is doing well.  He denies  any chest pain or shortness of breath.  He is staying fairly active.  He  does note that he has been having more pain in his upper back and  shoulders.  He has tried some nitroglycerin, but this has not helped.  It feels better when he gets massaged.  He once had a shot of cortisone  there and it got much better as well.   He denies any orthopnea or PND.  No lower extremity edema.  He has been  compliant with his medications.  Blood pressure at home has been  systolics 125-130.   CURRENT MEDICATIONS:  1. Flomax 0.4 a day.  2. Allopurinol 30 a day.  3. Lisinopril 20 a day.  4. Zocor 80 a day.  5. Carvedilol 12.5 b.i.d.  6. Mobic 7.5 a day.   PHYSICAL EXAMINATION:  GENERAL:  He is in no acute distress, ambulates  around the clinic without any respiratory difficulty.  VITAL SIGNS:  Blood pressure 136/78, heart rate 60, and weight is 202.  HEENT:  Normal.  NECK:  Supple.  There is no JVD.  Carotids are 2+ bilaterally without  bruits.  There is no lymphadenopathy or thyromegaly.  CARDIAC:  PMI is nondisplaced.  Regular rate and rhythm.  No  murmurs,  rubs, or gallops.  LUNGS:  Clear.  ABDOMEN:  Obese, nontender, and nondistended.  No hepatosplenomegaly, no  bruits, no masses appreciated.  EXTREMITIES:  Warm.  There is no cyanosis, clubbing, or edema.  He has a  frozen left shoulder which he cannot move higher than 90 degrees.  He  has significant tenderness over his left scapular region.  NEUROLOGIC:  Alert and oriented x3.  Cranial nerves II through XII are  intact.  Moves all four extremities without difficulty except for the  left upper extremity.   EKG shows sinus rhythm, rate is 60.  No ST-T wave abnormalities.  There  is a first-degree AV block with PR interval of 204 milliseconds.   ASSESSMENT AND PLAN:  1. Coronary artery disease, this is stable.  No evidence of ischemia.      Continue current therapy.  2. Hypertension.  Blood pressure is mildly elevated here, but well      controlled at home.  He will follow up with Dr. Carolyne Fiscal.  3. Hyperlipidemia, this is followed by Dr. Carolyne Fiscal.  Goal LDL is less      than 70.  4. Back and shoulder pain.  We are going to refer him to [facility] for further evaluation.     [name], MD  Electronically Signed    DRB/MedQ  DD: 03/19/2008  DT: 03/20/2008  Job #: [id number]

## 2010-06-17 NOTE — Assessment & Plan Note (Signed)
[facility]                              CARDIOLOGY OFFICE NOTE   LARELL, BANEY                      MRN:          [id number]  DATE:09/21/2005                            DOB:          1941-01-27    PRIMARY CARE PHYSICIAN:  Dr. Yetta Flock[place]   PATIENT IDENTIFICATION:  Mr. Mizrahi is a very pleasant 70 year old male  with a history of coronary artery disease who just moved from [place],  [place] and is here to establish long-term cardiology care.   PROBLEM LIST:  1. Coronary artery disease.      a.     Status post non ST elevation myocardial infarction in December       2005.      b.     Cardiac catheterization by report had 80% left main, 80% LAD,       90% ramus, 50% circ, 90% in nondominant right coronary artery with       ejection fraction of 40%.      c.     Status post coronary artery bypass grafting with a LIMA to the       LAD, saphenous vein graft to the diagonal, saphenous vein graft to the       ramus, saphenous vein graft to the obtuse marginal and saphenous vein       graft to the right coronary artery.  2. Postop atrial fibrillation maintained on sotalol briefly.  3. Mild left ventricular dysfunction, resolved.      a.     Treadmill thallium May 2006 showed an ejection fraction of 60%       with no evidence of ischemia or infarction.  4. Central obesity.  5. Hypertension.  6. Hyperlipidemia, recently stopped Zocor as his cholesterol was well      controlled.  7. Gout.  8. Benign prostatic hypertrophy.  9. Gall stones status post cholecystectomy.   CURRENT MEDICATIONS:  1. Flomax 0.4 a day.  2. Atenolol 50 q.d.Marland Kitchen  3. Allopurinol 300 a day.  4. Prednisone taper for gout.  5. Colchicine p.r.n.  6. Aspirin 325 a day.   ALLERGIES:  NO KNOWN DRUG ALLERGIES.   HISTORY OF PRESENT ILLNESS:  Mr. Olgin is a very pleasant 70 year old male  from [place], [place]  recently moved to be closer to his two  daughters in [place].  He suffered a non ST elevation myocardial  infarction in December 2005.  This shows significant left main and three  vessel disease with a non dominant right coronary artery.  EF was 40% at  that time.  He underwent bypass surgery.  His postop course was complicated  by atrial fibrillation.  He was briefly treated with sotalol.  Follow up  stress  testing done in May 2006 showed an EF of 60% with no ischemia.  He  states he has been feeling well.  He does not perform any significant  exercise activity after completing cardiac rehab but he is quite active  around the house and in his yard.  He has not had any chest  pain, shortness  of breath.  He has occasional very brief palpitations which sound like PVCs.  He has not had any heart failure symptoms.  He is not smoking, however, he  does drink four Bourbons a day.  He was previously on Zocor but his  physician stopped it and said his LDL was well controlled.  He does take his  blood pressure quite frequently at home and typically systolic pressures are  in the 130s.  He denies any heavy snoring or witnessed apnea.   On review of systems he does have arthritis pain, denies any bright red  blood per rectum, melena, reflux disease.  He does have gout as well as mild  erectile dysfunction.  Remainder of the review of systems is negative except  for HPI and problem list.   PHYSICAL EXAMINATION:  GENERAL:  He is well-appearing, in no acute distress.  He ambulates around the clinic freely.  VITAL SIGNS:  Blood pressure is 164/98, heart rate is 52, his weight is 203.  HEENT:  Sclera anicteric, EOMI.  There is no xanthelasma.  Mucous membranes  moist.  NECK:  Supple.  There is no JVD.  Carotids are 2+ bilaterally with no  bruits.  There is no lymphadenopathy or thyromegaly.  CARDIAC:  Bradycardic and regular, no murmurs, rubs, or gallops.  LUNGS:  Clear.  ABDOMEN:  Obese and  nontender, nondistended.  There is no  hepatosplenomegaly, no bruits, no masses. There is a well-healed  cholecystectomy scar.  EXTREMITIES:  Warm with no clubbing, cyanosis or edema.  NEUROLOGIC:  He is alert and oriented x3.  Cranial nerves II through XII are  intact.  Moves all four extremities without difficulty.   EKG shows sinus bradycardia at a rate of 52, no ST-T wave changes.   ASSESSMENT AND PLAN:  1. Coronary artery disease status post coronary artery bypass graft.  He      is  doing quite well from a symptomatic standpoint without any evidence      of recurrent angina.  He is on a beta blocker but I do not favor      Atenolol and  given his hypertension, I think we may get a better      effect with Coreg.  I will switch him over to Coreg CR 20 mg a day.  I      have also asked him to reinitiate his Zocor at 20 mg a day with a      warning that he should cut down on his alcohol intake as to not      overload his liver.  Will need to keep his LDL under 70.  In the future      we also need to consider the possible addition of Plavix and possibly      an ACE inhibitor or ARB.  2. Hypertension.  He seems to have a component of white coat hypertension,      however, his blood pressures at home are also elevated.  We are      switching his Atenolol to Coreg and we may need to also add an ACE      inhibitor ARB.  3. Hyperlipidemia, resume Statin.  Once again, patient advised to decrease      his alcohol intake.  4. Obesity.  I have reminded him of the risk of central obesity and      metabolic syndrome and development of diabetes.  I have asked him and  his wife to walk 45 minutes to 50 minutes most days of the week in an      effort to lose weight.   ADDENDUM:  I have also told him that he can decrease his aspirin to 81 mg a  day.   DISPOSITION:  We will see him back in clinic in six months for routine  followup.                                [name],  MD    DRB/MedQ DD:  09/21/2005  DT:  09/21/2005  Job #:  [id number]   cc:   [facility]

## 2010-06-17 NOTE — Assessment & Plan Note (Signed)
[facility] HEALTHCARE                            CARDIOLOGY OFFICE NOTE   CALIBER, LANDESS                      MRN:          [id number]  DATE:03/13/2006                            DOB:          Jul 23, 1940    PRIMARY CARE PHYSICIAN:  Raynelle Jan, M.D.   PATIENT IDENTIFICATION:  Dylan Roth is a delightful 70 year old male  with a history of coronary artery disease status post previous non-ST-  elevation myocardial infarction and bypass surgery, as well as  hypertension, hyperlipidemia, and gout who presents today for routine  followup.   Overall, he is doing quite well.  He denies any chest pain.  He does get  very brief palpitations every once in a while which last a second or two  and then go away.  He calls them twinges.  No shortness of breath, no  heart failure symptoms.  He has been trying to walk but limited by his  gout.  His family physician recently started him on Mobic which has  helped significantly.  He does also have problems with having to go the  bathroom frequently just right after eating which sounds like possible  irritable bowel syndrome.   PROBLEM LIST:  For complete problem list, please see my note of September 21, 2005.   MEDICATIONS:  1. Flomax 0.4 a day.  2. Allopurinol 300 a day.  3. Zocor 20 a day.  4. Aspirin 81 a day.  5. Coreg 6.25 supposed to be taken twice a day, mostly just takes it      once a day.  6. Lisinopril 20 a day.  7. Mobic daily.   ALLERGIES/INTOLERANCE:  He has stopped his HYDROCHLOROTHIAZIDE due to  gout.   PHYSICAL EXAMINATION:  GENERAL:  He is well-appearing in no acute  distress.  Ambulates around the clinic without any respiratory  difficulty.  VITAL SIGNS:  Blood pressure is 136/84, heart rate 66, his weight is  205.  HEENT:  Sclerae anicteric, EOMI.  There are no xanthelasmas.  Mucous  membranes are moist.  NECK:  Supple.  There is no JVD.  Carotids are 2+ bilaterally with no  bruits.   There is no lymphadenopathy or thyromegaly.  CARDIAC:  He has a regular rate and rhythm.  No murmurs, rubs or  gallops.  LUNGS:  Clear.  ABDOMEN:  Obese, nontender, nondistended.  There is no  hepatosplenomegaly, no bruits, no masses.  Well-healed cholecystectomy  scar, good bowel sounds.  EXTREMITIES:  Warm with no cyanosis, clubbing or edema.  NEUROLOGIC:  He is alert and oriented x3.  Cranial nerves II-XII are  intact.  Moves all four extremities without difficulty.  Affect is very  bright.   EKG shows a sinus rhythm at a rate of 66.  No ST-T wave abnormalities.   ASSESSMENT AND PLAN:  1. Coronary artery disease.  This is quite stable.  He is on a good      medical regimen.  Will continue this.  2. Hypertension, suboptimally controlled.  Increase his Coreg to 9.375      mg b.i.d.  I reminded  him of the need to take it twice a day.  3. Hyperlipidemia.  Most recent LDL was 64 which is at goal.  Continue      current therapy.  4. Erectile dysfunction.  We have refilled his prescription for      Viagra.  5. Possible irritable bowel syndrome.  We referred him to Dr. Wendall Papa in GI for further evaluation.     [name], MD  Electronically Signed    DRB/MedQ  DD: 03/13/2006  DT: 03/13/2006  Job #: [id number]   cc:   Raynelle Jan, M.D.

## 2010-06-28 ENCOUNTER — Encounter: Payer: Self-pay | Admitting: Internal Medicine

## 2010-07-11 ENCOUNTER — Ambulatory Visit (INDEPENDENT_AMBULATORY_CARE_PROVIDER_SITE_OTHER): Payer: Medicare Other | Admitting: Internal Medicine

## 2010-07-11 ENCOUNTER — Encounter: Payer: Self-pay | Admitting: Internal Medicine

## 2010-07-11 VITALS — BP 116/84 | HR 67 | Ht 66.0 in | Wt 198.1 lb

## 2010-07-11 DIAGNOSIS — I2510 Atherosclerotic heart disease of native coronary artery without angina pectoris: Secondary | ICD-10-CM

## 2010-07-11 DIAGNOSIS — R002 Palpitations: Secondary | ICD-10-CM

## 2010-07-11 DIAGNOSIS — E7800 Pure hypercholesterolemia, unspecified: Secondary | ICD-10-CM

## 2010-07-11 DIAGNOSIS — I10 Essential (primary) hypertension: Secondary | ICD-10-CM

## 2010-07-11 LAB — BASIC METABOLIC PANEL
Calcium: 8.9 mg/dL (ref 8.4–10.5)
Chloride: 104 mEq/L (ref 96–112)
Creatinine, Ser: 1.1 mg/dL (ref 0.4–1.5)
Sodium: 139 mEq/L (ref 135–145)

## 2010-07-11 LAB — LIPID PANEL
HDL: 35.3 mg/dL — ABNORMAL LOW (ref >39.00)
LDL Cholesterol: 86 mg/dL (ref 0–99)
Total CHOL/HDL Ratio: 4
Triglycerides: 64 mg/dL (ref 0.0–149.0)

## 2010-07-11 LAB — HEPATIC FUNCTION PANEL
Alkaline Phosphatase: 59 U/L (ref 39–117)
Bilirubin, Direct: 0.1 mg/dL (ref 0.0–0.3)
Total Bilirubin: 0.6 mg/dL (ref 0.3–1.2)

## 2010-07-11 NOTE — Assessment & Plan Note (Signed)
Blood pressure well controlled. Continue current regimen.  

## 2010-07-11 NOTE — Assessment & Plan Note (Signed)
Stable. I reassured him that these are likely PACs/PVCs as seen on previous monitor. If gettingg worse can repeat monitor in future. No symptoms suspicious for AF.

## 2010-07-11 NOTE — Assessment & Plan Note (Signed)
No evidence of ischemia. Continue current regimen.   

## 2010-07-11 NOTE — Patient Instructions (Signed)
Labs today (bmet, liver, lipid) Your physician wants you to follow-up in: 6 months.  You will receive a reminder letter in the mail two months in advance. If you don't receive a letter, please call our office to schedule the follow-up appointment.

## 2010-07-11 NOTE — Progress Notes (Signed)
HPI:  Dylan Roth is a very pleasant 70 year old male with a history of coronary artery disease status post bypass surgery in 2005 also has a history of palpitations due to PAC/PVCs, hypertension, hyperlipidemia, hyperkalemia, obesity, chronic back and shoulder pain with a frozen left shoulder.    Saw [name] in 9/10 for palpitations. Echo showed EF 60-65% mild AI/TR. Reassured that PACs and PVCs on previous monitor were benign.  Stress Myoview 6/11 EF normal. Normal perfusion.   Returns for routine f/u. Remains active. Rides 15-20 min on stationary bike most days. Very active working in yard. Trimming trees, digging, working in yard. No angina. Occasional dyspnea. Continues with occasional palpitations - mostly skipped beats. No tachypalpitations.     ROS: All systems negative except as listed in HPI, PMH and Problem List.  Past Medical History  Diagnosis Date  . Atrial fibrillation     postop  . CAD (coronary artery disease) Dec 2005    and MI. s/p CABG  . Palpitations     PACs/PVCs on monitor   . Hyperlipidemia   . Hypertension   . Central obesity   . Gout   . BPH (benign prostatic hyperplasia)   . Gall stones   . Chronic back pain   . Chronic shoulder pain     Current Outpatient Prescriptions  Medication Sig Dispense Refill  . allopurinol (ZYLOPRIM) 300 MG tablet Take 300 mg by mouth daily.        . carvedilol (COREG) 12.5 MG tablet Take 12.5 mg by mouth 2 (two) times daily.        Marland Kitchen lisinopril (PRINIVIL,ZESTRIL) 30 MG tablet Take 30 mg by mouth daily.        . nitroGLYCERIN (NITROSTAT) 0.4 MG SL tablet Place 0.4 mg under the tongue every 5 (five) minutes as needed.        . Omega-3 Fatty Acids (FISH OIL) 1000 MG CAPS Take 2 capsules by mouth daily.        . sildenafil (VIAGRA) 50 MG tablet Take 50 mg by mouth daily as needed.        . simvastatin (ZOCOR) 40 MG tablet Take 40 mg by mouth at bedtime. Take at bedtime occasionally.       . Tamsulosin HCl (FLOMAX) 0.4 MG CAPS Take  0.4 mg by mouth daily.        Marland Kitchen DISCONTD: lisinopril (PRINIVIL,ZESTRIL) 20 MG tablet Take 10 mg by mouth daily.          PHYSICAL EXAM: Filed Vitals:   07/11/10 0947  BP: 116/84  Pulse: 67   General:  Well appearing. No resp difficulty HEENT: normal Neck: supple. JVP flat. Carotids 2+ bilaterally; no bruits. No lymphadenopathy or thryomegaly appreciated. Cor: PMI normal. Regular rate & rhythm. No rubs, gallops or murmurs. Lungs: clear Abdomen: mildly obese. soft, nontender, nondistended. No hepatosplenomegaly. No bruits or masses. Good bowel sounds. Extremities: no cyanosis, clubbing, rash, edema Neuro: alert & orientedx3, cranial nerves grossly intact. Moves all 4 extremities w/o difficulty. Affect pleasant.    ECG: Sinus brady 55 (1 AVB 232 ms) No ST-T wave abnormalities.     ASSESSMENT & PLAN:

## 2010-07-11 NOTE — Assessment & Plan Note (Signed)
Has made some significant lifestyle changes. We discussed these and I congratulated him on these. We will recheck lipids today and adjust regimen as needed with goal HDL >40 and LDL < 70.

## 2010-07-27 ENCOUNTER — Telehealth: Payer: Self-pay | Admitting: *Deleted

## 2010-07-27 DIAGNOSIS — E785 Hyperlipidemia, unspecified: Secondary | ICD-10-CM

## 2010-07-27 MED ORDER — ATORVASTATIN CALCIUM 40 MG PO TABS: 40.0000 mg | ORAL_TABLET | Freq: Every day | ORAL | Status: DC

## 2010-07-27 NOTE — Telephone Encounter (Signed)
Message copied by Noralee Space on Wed Jul 27, 2010  4:26 PM ------      Message from: Dolores Patty      Created: Tue Jul 12, 2010 11:48 PM       LDL not quite at goal. Change to atorva 40. Repeat 3 months

## 2010-07-27 NOTE — Telephone Encounter (Signed)
Pt aware will contact him in 3 months to recheck

## 2010-08-08 ENCOUNTER — Telehealth: Payer: Self-pay | Admitting: Internal Medicine

## 2010-08-08 NOTE — Telephone Encounter (Signed)
C/O b/p is low today. 120/78. Yesterday 90/68.  Seeing circles.

## 2010-08-08 NOTE — Telephone Encounter (Signed)
Spoke w/pt he states he saw pcp on Sat for low BP and she cut lisinopril to in half he states Wynelle Link seemed ok but today was in the 90s now up to 106/72 he states he is staying well hydrated and drinking water he will cont to monitor BP and let us now if staying low

## 2010-08-12 ENCOUNTER — Telehealth: Payer: Self-pay | Admitting: Internal Medicine

## 2010-08-12 NOTE — Telephone Encounter (Signed)
Pt having rapid heart beat and sob. Pt states she has spoken with heather and would like to talk to heather again.

## 2010-08-12 NOTE — Telephone Encounter (Signed)
Spoke w/pt he is cont to have same palps as before and with SOB it seems as this is mainly occuring while he is working outside discussed being careful in the heat, he states his BP has gotten better 135/86 HR 73 today, he will cont to monitor and let me know if still having palps frequently next week

## 2010-08-15 ENCOUNTER — Telehealth: Payer: Self-pay | Admitting: Internal Medicine

## 2010-08-15 NOTE — Telephone Encounter (Signed)
Left message for pt to call back to discuss palps.  Of note, Dr Bensimhon's last office note does states if the pt's palps starting getting worse in the future he could repeat the monitor.

## 2010-08-15 NOTE — Telephone Encounter (Signed)
C/o palps. Offer an appt to see PA pt refuse wanted to see Dr.  Gala Romney .

## 2010-08-16 NOTE — Telephone Encounter (Signed)
Pt called back and stated palpitations are somewhat better now.  They do seem to be worse in the mornings when he first gets up "because I am thinking about them I think".  He was outside today mowing and picking up limbs and did fine though he did report some SOB.  BP 120/78 and HR 60 to 65.  He mentioned several times that if he doesn't think about them and is busy then he doesn't notice them.  When he does think about them at times he can hear/feel it in his ears.  Of note - pt states his PCP recently decreased his Lisinopril to 20 mg a day due to dizziness which is much better now per report by pt.  Pt aware I will send this information to [facility] and Dr Gala Romney for their review.  I did mention to pt that last office note says he can possibly repeat a monitor if palps return and get worse.  Pt states he has had monitors before and they really don't find anything.

## 2010-08-16 NOTE — Telephone Encounter (Signed)
Left message

## 2010-08-17 NOTE — Telephone Encounter (Signed)
Left message to call back  

## 2010-08-19 NOTE — Telephone Encounter (Signed)
Pt has returned your call from this am.  Please call him back.

## 2010-08-19 NOTE — Telephone Encounter (Signed)
I think they are likely benign but if they are becoming troublesome place 2 week event monitor.

## 2010-08-19 NOTE — Telephone Encounter (Signed)
Left message to call back  

## 2010-08-23 NOTE — Telephone Encounter (Signed)
Pt states he will hold off on monitor right now, if continues to be bothersome he will call back for monitor

## 2010-10-26 ENCOUNTER — Encounter: Payer: Self-pay | Admitting: Internal Medicine

## 2011-01-10 ENCOUNTER — Encounter: Payer: Self-pay | Admitting: Cardiovascular Disease

## 2011-01-10 ENCOUNTER — Ambulatory Visit (INDEPENDENT_AMBULATORY_CARE_PROVIDER_SITE_OTHER): Payer: Medicare Other | Admitting: Cardiovascular Disease

## 2011-01-10 VITALS — BP 138/80 | HR 76 | Ht 67.0 in | Wt 201.0 lb

## 2011-01-10 DIAGNOSIS — I4892 Unspecified atrial flutter: Secondary | ICD-10-CM

## 2011-01-10 DIAGNOSIS — I2510 Atherosclerotic heart disease of native coronary artery without angina pectoris: Secondary | ICD-10-CM

## 2011-01-10 LAB — CBC WITH DIFFERENTIAL/PLATELET
Basophils Relative: 0.4 % (ref 0.0–3.0)
MCHC: 34 g/dL (ref 30.0–36.0)
MCV: 98.6 fl (ref 78.0–100.0)
Monocytes Absolute: 0.7 10*3/uL (ref 0.1–1.0)
Neutro Abs: 5.3 10*3/uL (ref 1.4–7.7)
Neutrophils Relative %: 64.7 % (ref 43.0–77.0)
RBC: 4.64 Mil/uL (ref 4.22–5.81)
RDW: 14.6 % (ref 11.5–14.6)

## 2011-01-10 MED ORDER — DILTIAZEM HCL ER COATED BEADS 120 MG PO CP24: 120.0000 mg | ORAL_CAPSULE | Freq: Every day | ORAL | Status: DC

## 2011-01-10 MED ORDER — RIVAROXABAN 20 MG PO TABS: 20.0000 mg | ORAL_TABLET | Freq: Every day | ORAL | Status: DC

## 2011-01-10 NOTE — Assessment & Plan Note (Signed)
BP well controlled.

## 2011-01-10 NOTE — Assessment & Plan Note (Signed)
Stable. No changes. Lipids are followed in primary care.

## 2011-01-10 NOTE — Assessment & Plan Note (Signed)
This is a new diagnosis. He has a long history of palpitations but previous workup negative including monitors. Atrial flutter today is rate controlled. I have spent over 30 minutes discussing the etiology of atrial fibrillation with rate control strategies as well as the possibility of future cardioversion.  I have discussed the risk of CVA if he is not anti-coagulated. I will start Xarelto 20 mg po QDaily. Will check BMET and CBC today. I will start Cardizem CD 120 mg po Qdaily.  I will have him wear a 48 hour monitor to make sure we are doing a good job with rate control, no pauses with addition of Calcium channel blocker. I will see him back in one month. If he is still in atrial flutter, will consider cardioversion after one month of anti-coagulation.

## 2011-01-10 NOTE — Patient Instructions (Addendum)
Your physician recommends that you schedule a follow-up appointment in: 1 month  Your physician has recommended you make the following change in your medication:  Start Xarelto 20 mg by mouth daily. Start Cardizem CD (Diltiazem CD) 120 mg by mouth daily.  Your physician has recommended that you wear a holter monitor. Holter monitors are medical devices that record the heart's electrical activity. Doctors most often use these monitors to diagnose arrhythmias. Arrhythmias are problems with the speed or rhythm of the heartbeat. The monitor is a small, portable device. You can wear one while you do your normal daily activities. This is usually used to diagnose what is causing palpitations/syncope (passing out).   You had lab work done today.

## 2011-01-10 NOTE — Progress Notes (Signed)
History of Present Illness: 70 yo WM with history of CAD s/p 5V CABG 2005, post-op atrial fibrillation, HTN, HLD, palpitations (PACs/PVCs),  hyperkalemia, obesity, chronic back and shoulder pain with a frozen left shoulder who is here today for cardiac follow up. He has been followed in the past by Dr. Gala Romney. He has been evaluated in the past for palpitations. Echo September 2010 showed EF 60-65% mild AI/TR. Stress Myoview 6/11 EF normal. Normal perfusion.    He is here today for follow up. He denies chest pain or SOB. No dizziness, near syncope or syncope. He has been noticing his heart flutter and skip beats. There are usually no prolonged episodes of palpitations. He tends to notice these more at night. He has cut out caffeine from his diet.   His primary care is Dr. Joetta Manners in [facility].   Past Medical History  Diagnosis Date  . Atrial fibrillation     postop  . CAD (coronary artery disease) Dec 2005    and MI. s/p CABG  . Palpitations     PACs/PVCs on monitor   . Hyperlipidemia   . Hypertension   . Central obesity   . Gout   . BPH (benign prostatic hyperplasia)   . Gall stones   . Chronic back pain   . Chronic shoulder pain     Past Surgical History  Procedure Date  . Coronary artery bypass graft 2005    in [place]   . Cholecystectomy 1978     Current Outpatient Prescriptions  Medication Sig Dispense Refill  . allopurinol (ZYLOPRIM) 300 MG tablet Take 300 mg by mouth daily.        Marland Kitchen atorvastatin (LIPITOR) 40 MG tablet Take 1 tablet (40 mg total) by mouth daily.  30 tablet  6  . carvedilol (COREG) 12.5 MG tablet Take 12.5 mg by mouth 2 (two) times daily.        Marland Kitchen lisinopril (PRINIVIL,ZESTRIL) 20 MG tablet Take 20 mg by mouth daily.        . nitroGLYCERIN (NITROSTAT) 0.4 MG SL tablet Place 0.4 mg under the tongue every 5 (five) minutes as needed.        . Omega-3 Fatty Acids (FISH OIL) 1000 MG CAPS Take 2 capsules by mouth daily.        . sildenafil  (VIAGRA) 50 MG tablet Take 50 mg by mouth daily as needed.        . Tamsulosin HCl (FLOMAX) 0.4 MG CAPS Take 0.4 mg by mouth daily.          No Known Allergies  History   Social History  . Marital Status: Married    Spouse Name: N/A    Number of Children: 2  . Years of Education: N/A   Occupational History  . retired-truck driver   .     Social History Main Topics  . Smoking status: Never Smoker   . Smokeless tobacco: Never Used  . Alcohol Use: No     2 drinks (bourbon) a night  . Drug Use: No  . Sexually Active: Not on file   Other Topics Concern  . Not on file   Social History Narrative   Married.     Family History  Problem Relation Age of Onset  . Cancer Mother     Review of Systems:  As stated in the HPI and otherwise negative.   BP 138/80  Pulse 76  Ht 5\' 7"  (1.702 m)  Wt 201 lb (  91.173 kg)  BMI 31.48 kg/m2  Physical Examination: General: Well developed, well nourished, NAD HEENT: OP clear, mucus membranes moist SKIN: warm, dry. No rashes. Neuro: No focal deficits Musculoskeletal: Muscle strength 5/5 all ext Psychiatric: Mood and affect normal Neck: No JVD, no carotid bruits, no thyromegaly, no lymphadenopathy. Lungs:Clear bilaterally, no wheezes, rhonci, crackles Cardiovascular: Regular rate and rhythm. No murmurs, gallops or rubs. Abdomen:Soft. Bowel sounds present. Non-tender.  Extremities: No lower extremity edema. Pulses are 2 + in the bilateral DP/PT.  ZOX:WRUEAV flutter, rate 76 bpm. Non-specific ST changes.

## 2011-01-11 LAB — BASIC METABOLIC PANEL
CO2: 26 mEq/L (ref 19–32)
Calcium: 9.1 mg/dL (ref 8.4–10.5)
Chloride: 103 mEq/L (ref 96–112)
Creatinine, Ser: 1.2 mg/dL (ref 0.4–1.5)
Glucose, Bld: 99 mg/dL (ref 70–99)

## 2011-01-16 ENCOUNTER — Telehealth: Payer: Self-pay | Admitting: Cardiovascular Disease

## 2011-01-16 NOTE — Telephone Encounter (Signed)
Pt was given diltiazem 120.mg qd and xarelto 20mg  qd at last visit and he is now having palpitations and he wants to know is this normal

## 2011-01-16 NOTE — Telephone Encounter (Signed)
Patient called, stated he had palpitations yesterday,off and on for about 2 hours.States no palpitations this morning.Advised to take Diltiazem 120 mg daily and Xarelto 20 mg daily as prescribed.Advised to wear 48 hour monitor, to be put on this Wednesday 01/18/11.Advised to call back if needed.

## 2011-01-18 ENCOUNTER — Encounter (INDEPENDENT_AMBULATORY_CARE_PROVIDER_SITE_OTHER): Payer: Medicare Other

## 2011-01-18 DIAGNOSIS — I4891 Unspecified atrial fibrillation: Secondary | ICD-10-CM

## 2011-01-18 DIAGNOSIS — I4892 Unspecified atrial flutter: Secondary | ICD-10-CM

## 2011-01-19 ENCOUNTER — Telehealth: Payer: Self-pay | Admitting: Cardiovascular Disease

## 2011-01-19 NOTE — Telephone Encounter (Signed)
Spoke with pt. He reports that he has been having palpitations at times. He describes as a flutter or skipping every now and then. Not continuous. Some shortness of breath at times but none at present.  Light headed at times but does not last long. No syncope. States he has a cold and has been coughing a lot.  Blood pressure 124/80 and heart rate 68. Pt is not taking decongestants and is aware he should not take them.  Is taking Cardizem and Xarelto as prescribed. Pt to turn monitor in tomorrow.   I told pt to continue meds as he has been doing and we would call him with results of monitor when available.  I instructed him if heart were to feel as if it was racing or he had acute shortness of breath or dizziness he should be taken to ED to be evaluated.

## 2011-01-19 NOTE — Telephone Encounter (Signed)
Forwarded to Dr. Gibson Ramp nurse.

## 2011-01-19 NOTE — Telephone Encounter (Signed)
Agree. cdm 

## 2011-01-19 NOTE — Telephone Encounter (Signed)
Pt is having SOB, palpitations and light headed and pt is concerned and was told to call and he is wearing a monitor and he gets that off tomorrow around 12

## 2011-01-20 ENCOUNTER — Telehealth: Payer: Self-pay | Admitting: *Deleted

## 2011-01-20 DIAGNOSIS — I4892 Unspecified atrial flutter: Secondary | ICD-10-CM

## 2011-01-20 MED ORDER — CARVEDILOL 6.25 MG PO TABS: 6.2500 mg | ORAL_TABLET | Freq: Two times a day (BID) | ORAL | Status: DC

## 2011-01-20 NOTE — Telephone Encounter (Signed)
Agree. cdm 

## 2011-01-20 NOTE — Telephone Encounter (Signed)
Pt dropped holter monitor off today. Monitor report discussed with Dr. Clifton James. Per Dr. Clifton James pt to stop Cardizem and make office visit for January 23, 2011. Have Dr. Antoine Poche (DOD) review.  Monitor results shown to Dr. Antoine Poche.  Per Dr. Antoine Poche pt to decrease Carvedilol to 6.25 mg by mouth twice daily.  I called and spoke with pt and gave him instructions regarding medicine changes and reviewed monitor results.  He is aware to continue Xarelto.  He is having palpitations at times but no other complaints. He is aware he should go to ED if he has increased light headedness or other symptoms.  He will come in to see Dr. Clifton James at 9:30 on January 23, 2011.

## 2011-01-23 ENCOUNTER — Encounter: Payer: Self-pay | Admitting: Cardiovascular Disease

## 2011-01-23 ENCOUNTER — Ambulatory Visit (INDEPENDENT_AMBULATORY_CARE_PROVIDER_SITE_OTHER): Payer: Medicare Other | Admitting: Cardiovascular Disease

## 2011-01-23 VITALS — BP 120/74 | HR 60 | Ht 66.0 in | Wt 199.0 lb

## 2011-01-23 DIAGNOSIS — I4892 Unspecified atrial flutter: Secondary | ICD-10-CM

## 2011-01-23 NOTE — Progress Notes (Signed)
History of Present Illness: 70 yo WM with history of CAD s/p 5V CABG 2005, post-op atrial fibrillation, HTN, HLD, palpitations (PACs/PVCs), hyperkalemia, obesity, chronic back and shoulder pain with a frozen left shoulder who is here today for cardiac follow up. He has been followed in the past by Dr. Gala Romney. He has been evaluated in the past for palpitations. Echo September 2010 showed EF 60-65% mild AI/TR. Stress Myoview 6/11 EF normal. Normal perfusion. I saw him 01/10/11 for the first time. His EKG showed atrial flutter. I started Cardizem CD 120 mg po Qdaily and xarelto. I had him wear a monitor for 48 hours. He had sinus rhythm with episodes of atrial flutter. He also had 4 second pauses. His cardizem was held and Coreg was lowered. He has done well since then. Feeling well. His palpitations are minimal.   His primary care is Dr. Joetta Manners in [facility].   Past Medical History  Diagnosis Date  . Atrial fibrillation     postop  . CAD (coronary artery disease) Dec 2005    and MI. s/p CABG  . Palpitations     PACs/PVCs on monitor   . Hyperlipidemia   . Hypertension   . Central obesity   . Gout   . BPH (benign prostatic hyperplasia)   . Gall stones   . Chronic back pain   . Chronic shoulder pain     Past Surgical History  Procedure Date  . Coronary artery bypass graft 2005    in [place]   . Cholecystectomy 1978     Current Outpatient Prescriptions  Medication Sig Dispense Refill  . allopurinol (ZYLOPRIM) 300 MG tablet Take 300 mg by mouth daily.        Marland Kitchen atorvastatin (LIPITOR) 40 MG tablet Take 1 tablet (40 mg total) by mouth daily.  30 tablet  6  . carvedilol (COREG) 6.25 MG tablet Take 1 tablet (6.25 mg total) by mouth 2 (two) times daily.  60 tablet  6  . lisinopril (PRINIVIL,ZESTRIL) 20 MG tablet Take 20 mg by mouth daily.        . nitroGLYCERIN (NITROSTAT) 0.4 MG SL tablet Place 0.4 mg under the tongue every 5 (five) minutes as needed.        . Omega-3 Fatty  Acids (FISH OIL) 1000 MG CAPS Take 2 capsules by mouth daily.        . Rivaroxaban (XARELTO) 20 MG TABS Take 20 mg by mouth daily.  30 tablet  6  . sildenafil (VIAGRA) 50 MG tablet Take 50 mg by mouth daily as needed.        . Tamsulosin HCl (FLOMAX) 0.4 MG CAPS Take 0.4 mg by mouth daily.          No Known Allergies  History   Social History  . Marital Status: Married    Spouse Name: N/A    Number of Children: 2  . Years of Education: N/A   Occupational History  . retired-truck driver   .     Social History Main Topics  . Smoking status: Never Smoker   . Smokeless tobacco: Never Used  . Alcohol Use: No     2 drinks (bourbon) a night  . Drug Use: No  . Sexually Active: Not on file   Other Topics Concern  . Not on file   Social History Narrative   Married.     Family History  Problem Relation Age of Onset  . Cancer Mother  Review of Systems:  As stated in the HPI and otherwise negative.   BP 120/74  Pulse 60  Ht 5\' 6"  (1.676 m)  Wt 199 lb (90.266 kg)  BMI 32.12 kg/m2  Physical Examination: General: Well developed, well nourished, NAD HEENT: OP clear, mucus membranes moist SKIN: warm, dry. No rashes. Neuro: No focal deficits Musculoskeletal: Muscle strength 5/5 all ext Psychiatric: Mood and affect normal Neck: No JVD, no carotid bruits, no thyromegaly, no lymphadenopathy. Lungs:Clear bilaterally, no wheezes, rhonci, crackles Cardiovascular: Regular rate and rhythm. No murmurs, gallops or rubs. Abdomen:Soft. Bowel sounds present. Non-tender.  Extremities: No lower extremity edema. Pulses are 2 + in the bilateral DP/PT.  ZOX:WRUEA, rate 60 bpm. 1st degree av block. Non-specific T wave changes.   48 hour holter monitor: He had sinus rhythm with episodes of atrial flutter. He also had 4 second pauses.

## 2011-01-23 NOTE — Patient Instructions (Signed)
Your physician recommends that you schedule a follow-up appointment in: 3 weeks. Your physician recommends that you continue on your current medications as directed. Please refer to the Current Medication list given to you today. 

## 2011-01-23 NOTE — Assessment & Plan Note (Signed)
Back in sinus. He did not tolerate Cardizem. Will not restart Cardizem. Will continue reduced dose of Coreg. I will see him back in 3 weeks. If he has any dizziness or syncope, he will call. He may need a pacemaker in the future.

## 2011-02-16 ENCOUNTER — Ambulatory Visit: Payer: Medicare Other | Admitting: Cardiovascular Disease

## 2011-02-24 ENCOUNTER — Ambulatory Visit: Payer: Medicare Other | Admitting: Cardiovascular Disease

## 2011-02-25 ENCOUNTER — Other Ambulatory Visit: Payer: Self-pay | Admitting: Internal Medicine

## 2011-03-03 ENCOUNTER — Ambulatory Visit (INDEPENDENT_AMBULATORY_CARE_PROVIDER_SITE_OTHER): Payer: Medicare Other | Admitting: Cardiovascular Disease

## 2011-03-03 ENCOUNTER — Encounter: Payer: Self-pay | Admitting: Cardiovascular Disease

## 2011-03-03 DIAGNOSIS — I4892 Unspecified atrial flutter: Secondary | ICD-10-CM

## 2011-03-03 DIAGNOSIS — I2510 Atherosclerotic heart disease of native coronary artery without angina pectoris: Secondary | ICD-10-CM

## 2011-03-03 NOTE — Patient Instructions (Signed)
Your physician wants you to follow-up in: 6 months  You will receive a reminder letter in the mail two months in advance. If you don't receive a letter, please call our office to schedule the follow-up appointment.  Your physician recommends that you continue on your current medications as directed. Please refer to the Current Medication list given to you today.  

## 2011-03-03 NOTE — Progress Notes (Signed)
History of Present Illness: 71 yo WM with history of CAD s/p 5V CABG 2005, post-op atrial fibrillation, HTN, HLD, palpitations (PACs/PVCs), hyperkalemia, obesity, chronic back and shoulder pain with a frozen left shoulder who is here today for cardiac follow up. He has been followed in the past by Dr. Gala Romney. He has been evaluated in the past for palpitations. Echo September 2010 showed EF 60-65% mild AI/TR. Stress Myoview 6/11 EF normal. Normal perfusion. I saw him 01/10/11 for the first time. His EKG showed atrial flutter. I started Cardizem CD 120 mg po Qdaily and xarelto. I had him wear a monitor for 48 hours. He had sinus rhythm with episodes of atrial flutter. He also had 4 second pauses. His cardizem was held and Coreg was lowered. He has done well since then. I saw him on 01/23/11 and he was in sinus rhythm.   He tells me today that he has been doing well. Rare palpitations that are short lived. No chest pain or SOB. No dizziness, near syncope or syncope.   His primary care is Dr. Joetta Manners in [facility].     Last lipids June 2012: Total chol: 134    HDL: 35   LDL: 86    Past Medical History  Diagnosis Date  . Atrial fibrillation     postop  . CAD (coronary artery disease) Dec 2005    and MI. s/p CABG  . Palpitations     PACs/PVCs on monitor   . Hyperlipidemia   . Hypertension   . Central obesity   . Gout   . BPH (benign prostatic hyperplasia)   . Gall stones   . Chronic back pain   . Chronic shoulder pain     Past Surgical History  Procedure Date  . Coronary artery bypass graft 2005    in [place]   . Cholecystectomy 1978     Current Outpatient Prescriptions  Medication Sig Dispense Refill  . allopurinol (ZYLOPRIM) 300 MG tablet Take 300 mg by mouth daily.        Marland Kitchen atorvastatin (LIPITOR) 40 MG tablet TAKE ONE TABLET BY MOUTH EVERY DAY  30 tablet  6  . carvedilol (COREG) 6.25 MG tablet Take 1 tablet (6.25 mg total) by mouth 2 (two) times daily.  60 tablet   6  . lisinopril (PRINIVIL,ZESTRIL) 20 MG tablet Take 20 mg by mouth daily.        . nitroGLYCERIN (NITROSTAT) 0.4 MG SL tablet Place 0.4 mg under the tongue every 5 (five) minutes as needed.        . Omega-3 Fatty Acids (FISH OIL) 1000 MG CAPS Take 2 capsules by mouth daily.        . Rivaroxaban (XARELTO) 20 MG TABS Take 20 mg by mouth daily.  30 tablet  6  . sildenafil (VIAGRA) 50 MG tablet Take 50 mg by mouth daily as needed.        . Tamsulosin HCl (FLOMAX) 0.4 MG CAPS Take 0.4 mg by mouth daily.          No Known Allergies  History   Social History  . Marital Status: Married    Spouse Name: N/A    Number of Children: 2  . Years of Education: N/A   Occupational History  . retired-truck driver   .     Social History Main Topics  . Smoking status: Never Smoker   . Smokeless tobacco: Never Used  . Alcohol Use: No     2  drinks (bourbon) a night  . Drug Use: No  . Sexually Active: Not on file   Other Topics Concern  . Not on file   Social History Narrative   Married.     Family History  Problem Relation Age of Onset  . Cancer Mother     Review of Systems:  As stated in the HPI and otherwise negative.   BP 126/82  Pulse 58  Ht 5\' 6"  (1.676 m)  Wt 197 lb 12.8 oz (89.721 kg)  BMI 31.93 kg/m2  Physical Examination: General: Well developed, well nourished, NAD HEENT: OP clear, mucus membranes moist SKIN: warm, dry. No rashes. Neuro: No focal deficits Musculoskeletal: Muscle strength 5/5 all ext Psychiatric: Mood and affect normal Neck: No JVD, no carotid bruits, no thyromegaly, no lymphadenopathy. Lungs:Clear bilaterally, no wheezes, rhonci, crackles Cardiovascular: Regular rate and rhythm. No murmurs, gallops or rubs. Abdomen:Soft. Bowel sounds present. Non-tender.  Extremities: No lower extremity edema. Pulses are 2 + in the bilateral DP/PT.  EKG: Sinus bradycardia, rate 58 bpm. 1st degree AV block.

## 2011-03-03 NOTE — Assessment & Plan Note (Addendum)
Stable. No changes. Lipids and BP well controlled.

## 2011-03-03 NOTE — Assessment & Plan Note (Signed)
He is maintaining sinus. Will continue beta blocker. He is on Xarelto for anti-coagulation. No bleeding issues. He did have some pauses on monitor that were asymptomatic in December 2012 so cardizem was held. Doing well on Coreg. He will call if palpitations worsen.

## 2011-03-28 ENCOUNTER — Telehealth: Payer: Self-pay | Admitting: Cardiovascular Disease

## 2011-03-28 DIAGNOSIS — I4892 Unspecified atrial flutter: Secondary | ICD-10-CM

## 2011-03-28 MED ORDER — CARVEDILOL 12.5 MG PO TABS: 12.5000 mg | ORAL_TABLET | Freq: Two times a day (BID) | ORAL | Status: DC

## 2011-03-28 NOTE — Telephone Encounter (Signed)
Spoke with pt. He reports frequent palpitations since this past Saturday. Happens at random times. Had episode on Saturday, Sunday and again today. Today started after he did chores and came back inside.  Blood pressure today 120/77 and heart rate 85.  Hard palpitations last for a few minutes but then he feels lightheaded afterwards. No shortness of breath but shoulder and back pain at times.  Taking all meds as prescribed

## 2011-03-28 NOTE — Telephone Encounter (Signed)
We can let him know that I will be glad to see him. We can try doubling coreg at home or have him come in this afternoon for visit and EKG. cdm

## 2011-03-28 NOTE — Telephone Encounter (Signed)
Spoke with pt and gave him information from Dr. Clifton James. He would like to try increased dose of Coreg.  He will call us if palpitations don't improve.

## 2011-03-28 NOTE — Telephone Encounter (Signed)
New Problem   Patient has been experiencing frequent heart palpitations over the last several days, No SOB, slight dizziness. Please return call to patient at hm# [phone]

## 2011-04-25 ENCOUNTER — Telehealth: Payer: Self-pay | Admitting: Cardiovascular Disease

## 2011-04-25 NOTE — Telephone Encounter (Signed)
New Problem:     Patient's wife called wanting to discuss some issues regarding her husbands new insurance supplement in regards to his cardiac condition. Please call back.

## 2011-04-25 NOTE — Telephone Encounter (Signed)
Left message to call back  

## 2011-05-02 NOTE — Telephone Encounter (Signed)
Left message to call back  

## 2011-05-11 ENCOUNTER — Telehealth: Payer: Self-pay | Admitting: Cardiovascular Disease

## 2011-05-11 NOTE — Telephone Encounter (Signed)
LOVx2,Stress,Echo,12 Monitor faxed to [facility] @ [phone] 05/11/11/KM

## 2011-05-12 ENCOUNTER — Telehealth: Payer: Self-pay | Admitting: Cardiovascular Disease

## 2011-05-12 NOTE — Telephone Encounter (Signed)
Fu call °Patient returning your call °

## 2011-05-12 NOTE — Telephone Encounter (Signed)
Patient returning a call. Patient is aware that medical records on stress echo and 12  monitor results were faxed to family practice at [facility] per [place] . Patient has an appointment with Dr. Graciela Husbands on Monday 05/15/11. Patient aware.

## 2011-05-15 ENCOUNTER — Other Ambulatory Visit (HOSPITAL_COMMUNITY): Payer: Self-pay | Admitting: Internal Medicine

## 2011-05-15 ENCOUNTER — Other Ambulatory Visit: Payer: Self-pay

## 2011-05-15 ENCOUNTER — Ambulatory Visit (INDEPENDENT_AMBULATORY_CARE_PROVIDER_SITE_OTHER): Payer: Medicare Other | Admitting: Internal Medicine

## 2011-05-15 ENCOUNTER — Encounter: Payer: Self-pay | Admitting: Internal Medicine

## 2011-05-15 VITALS — BP 132/80 | HR 57 | Ht 66.0 in | Wt 199.0 lb

## 2011-05-15 DIAGNOSIS — I495 Sick sinus syndrome: Secondary | ICD-10-CM | POA: Insufficient documentation

## 2011-05-15 DIAGNOSIS — I4891 Unspecified atrial fibrillation: Secondary | ICD-10-CM | POA: Insufficient documentation

## 2011-05-15 DIAGNOSIS — I2510 Atherosclerotic heart disease of native coronary artery without angina pectoris: Secondary | ICD-10-CM

## 2011-05-15 DIAGNOSIS — I4892 Unspecified atrial flutter: Secondary | ICD-10-CM

## 2011-05-15 MED ORDER — CARVEDILOL 12.5 MG PO TABS: 12.5000 mg | ORAL_TABLET | Freq: Two times a day (BID) | ORAL | Status: DC

## 2011-05-15 NOTE — Assessment & Plan Note (Signed)
We had a lengthy discussion regarding his afib which causes primarily palpitations.  There are very disturbing to him, witness that he sought a second opiinion within our group to seek help His rate control was adequate on holter so i think rhythm control must be the strategy.  We discussed proarrhtyhmic potential and he is willing to begin antiarrhythmic therapy.  He is not a candidate for Ic and i favor tikosyn over sotalol for effectiveness and over amiodarone given relative youth and side effects  His Qtc is fine so will plan admission for this and if he has significant breakthrough, would undertake an echo and then refer for catheter ablation  Appropriately he is on  Rivaroxaban

## 2011-05-15 NOTE — Assessment & Plan Note (Signed)
stable °

## 2011-05-15 NOTE — Progress Notes (Signed)
History and Physical  Patient ID: Dylan Roth MRN: [id number], SOB: [birth year] 71 y.o. Date of Encounter: 05/15/2011, 5:46 PM  Primary Physician: Joetta Manners, MD Primary Cardiologist: CM Primary Electrophysiologist:  SK  Chief Complaint: palpitations  History of Present Illness: Dylan Roth is a 71 y.o. male  Who normally sees Dr CM who asked his MD to set up appt with alternative MD to help withhis palpitations.  These have been ongoing for some time and he has been seen on a number of occassions for this. Monitors>>aflutter and Afib as well as some PVCs.  Noteably holter 12/12>>post termination pauses up to 5 sec, prompting CM to stop his dilt.  On followup he was apparently better, but clearly not better enough.    TERFactors include age;htn, vascular disease for CHADSVASc score of 3  He is bothered by his afib mostly because of the palps; he does not have significant exercise intolerance or LH.    I failed to ask him about whether he ahs symptoms of OSA  Past Medical History  Diagnosis Date  . Atrial fibrillation/flutter   . CAD (coronary artery disease) Dec 2005    and MI. s/p CABG  EF normal  2010--echo  . Sinus node dysfunction     post termination pauses <5sec  . Hyperlipidemia   . Hypertension   . Central obesity   . Gout   . BPH (benign prostatic hyperplasia)   . Gall stones   . Chronic back pain   . Chronic shoulder pain      Past Surgical History  Procedure Date  . Coronary artery bypass graft 2005    in [place]   . Cholecystectomy 1978       Current Outpatient Prescriptions  Medication Sig Dispense Refill  . allopurinol (ZYLOPRIM) 300 MG tablet Take 300 mg by mouth daily.        Marland Kitchen atorvastatin (LIPITOR) 40 MG tablet TAKE ONE TABLET BY MOUTH EVERY DAY  30 tablet  6  . carvedilol (COREG) 12.5 MG tablet Take 1 tablet (12.5 mg total) by mouth 2 (two) times daily.  60 tablet  6  . lisinopril (PRINIVIL,ZESTRIL) 20 MG tablet Take 20 mg  by mouth daily.        . nitroGLYCERIN (NITROSTAT) 0.4 MG SL tablet Place 0.4 mg under the tongue every 5 (five) minutes as needed.        . Omega-3 Fatty Acids (FISH OIL) 1000 MG CAPS Take 2 capsules by mouth daily.        . Rivaroxaban (XARELTO) 20 MG TABS Take 20 mg by mouth daily.  30 tablet  6  . sildenafil (VIAGRA) 50 MG tablet Take 50 mg by mouth daily as needed.        . Tamsulosin HCl (FLOMAX) 0.4 MG CAPS Take 0.4 mg by mouth daily.        Marland Kitchen DISCONTD: carvedilol (COREG) 12.5 MG tablet Take 1 tablet (12.5 mg total) by mouth 2 (two) times daily.  60 tablet  6     Allergies: No Known Allergies   History  Substance Use Topics  . Smoking status: Former Games developer  . Smokeless tobacco: Never Used  . Alcohol Use: No     2 drinks (bourbon) a night      Family History  Problem Relation Age of Onset  . Cancer Mother       ROS:  Please see the history of present illness.     All other systems reviewed and  negative.   Vital Signs: Blood pressure 132/80, pulse 57, height 5\' 6"  (1.676 m), weight 199 lb (90.266 kg).  PHYSICAL EXAM: General:  Well nourished, well developed but obese male in no acute distress HEENT: normal Lymph: no adenopathy Neck: JVP 7-8 cm with flutter waves Endocrine:  No thryomegaly Vascular: No carotid bruits; FA pulses 2+ bilaterally without bruits Cardiac:  normal S1, S2; RRR; no murmur Back: without kyphosis/scoliosis, no CVA tenderness Lungs:  clear to auscultation bilaterally, no wheezing, rhonchi or rales Abd: soft, nontender, no hepatomegaly Ext: no edema Musculoskeletal:  No deformities, BUE and BLE strength normal and equal Skin: warm and dry Neuro:  CNs 2-12 intact, no focal abnormalities noted Psych:  Normal affect   EKG:  03 Mar 2011 SR 58 22/08/40  Qtc 400  Holter as above with min HR 40 and max HR 90     ASSESSMENT AND PLAN:

## 2011-05-15 NOTE — Telephone Encounter (Signed)
Left message to call back  

## 2011-05-15 NOTE — Assessment & Plan Note (Signed)
i am also concerned about these and told him he might need pacing, certainly augmented rate control and amiodarone increases the likelihood; maybe tikosyn will help [place] avoid

## 2011-05-18 ENCOUNTER — Telehealth: Payer: Self-pay | Admitting: Internal Medicine

## 2011-05-18 NOTE — Telephone Encounter (Signed)
Spoke with pt.  Dylan Roth and I will both be off on Monday so asked if pt would be okay to start Tikosyn on Tuesday.  He is agreeable.  He was concerned over the cost.  I have asked him to check with his insurance company ASAP to find out what his copay will be.  If he cannot afford it, he will let us know prior to Tuesday.

## 2011-05-18 NOTE — Telephone Encounter (Signed)
New Problem:     Patient called in to try and schedule his appointment to test out the new drug that Dr. Graciela Husbands suggested in the hospital. Please call back.

## 2011-05-18 NOTE — Telephone Encounter (Signed)
Pt called back and reported that his co-pay was only going to be $35 and so he wants to start the medication.

## 2011-05-18 NOTE — Telephone Encounter (Signed)
I spoke with the patient and Dylan Roth is ready to go in for tikosyn. Dylan Roth would like to go when Dr. Graciela Husbands is on duty. Dylan Roth will go in on Monday 4/22 for initiation. Dylan Roth is aware I will notify Weston Brass, Pharm D and she will be in touch with him about coming for labs. The patient is agreeable. Staff message sent to Kennon Rounds and I will forward this phone note as well.

## 2011-05-19 ENCOUNTER — Other Ambulatory Visit: Payer: Self-pay

## 2011-05-19 DIAGNOSIS — I4892 Unspecified atrial flutter: Secondary | ICD-10-CM

## 2011-05-22 ENCOUNTER — Telehealth: Payer: Self-pay | Admitting: Cardiovascular Disease

## 2011-05-22 ENCOUNTER — Other Ambulatory Visit: Payer: Self-pay | Admitting: Cardiovascular Disease

## 2011-05-22 DIAGNOSIS — I4892 Unspecified atrial flutter: Secondary | ICD-10-CM

## 2011-05-22 MED ORDER — CARVEDILOL 12.5 MG PO TABS: 12.5000 mg | ORAL_TABLET | Freq: Two times a day (BID) | ORAL | Status: DC

## 2011-05-22 NOTE — Telephone Encounter (Signed)
New Problem: ° ° ° °Patient called in needing a refill of his carvedilol (COREG) 12.5 MG tablet. °

## 2011-05-23 ENCOUNTER — Inpatient Hospital Stay (HOSPITAL_COMMUNITY)
Admission: AD | Admit: 2011-05-23 | Discharge: 2011-05-26 | DRG: 244 | Disposition: A | Discharge status: Home / Self Care | Payer: Medicare Other | Source: Ambulatory Visit | Attending: Internal Medicine | Admitting: Internal Medicine

## 2011-05-23 ENCOUNTER — Encounter (HOSPITAL_COMMUNITY): Payer: Self-pay | Admitting: Physician Assistant

## 2011-05-23 ENCOUNTER — Ambulatory Visit (INDEPENDENT_AMBULATORY_CARE_PROVIDER_SITE_OTHER): Payer: Medicare Other | Admitting: Pharmacist

## 2011-05-23 VITALS — BP 118/72 | Ht 67.0 in | Wt 200.2 lb

## 2011-05-23 DIAGNOSIS — Z6830 Body mass index (BMI) 30.0-30.9, adult: Secondary | ICD-10-CM

## 2011-05-23 DIAGNOSIS — I495 Sick sinus syndrome: Secondary | ICD-10-CM | POA: Diagnosis present

## 2011-05-23 DIAGNOSIS — I10 Essential (primary) hypertension: Secondary | ICD-10-CM | POA: Insufficient documentation

## 2011-05-23 DIAGNOSIS — Z87891 Personal history of nicotine dependence: Secondary | ICD-10-CM

## 2011-05-23 DIAGNOSIS — E669 Obesity, unspecified: Secondary | ICD-10-CM | POA: Diagnosis present

## 2011-05-23 DIAGNOSIS — Z79899 Other long term (current) drug therapy: Secondary | ICD-10-CM

## 2011-05-23 DIAGNOSIS — E785 Hyperlipidemia, unspecified: Secondary | ICD-10-CM | POA: Diagnosis present

## 2011-05-23 DIAGNOSIS — Z7901 Long term (current) use of anticoagulants: Secondary | ICD-10-CM

## 2011-05-23 DIAGNOSIS — I4891 Unspecified atrial fibrillation: Principal | ICD-10-CM | POA: Diagnosis present

## 2011-05-23 DIAGNOSIS — G8929 Other chronic pain: Secondary | ICD-10-CM | POA: Diagnosis present

## 2011-05-23 DIAGNOSIS — Z951 Presence of aortocoronary bypass graft: Secondary | ICD-10-CM

## 2011-05-23 DIAGNOSIS — M129 Arthropathy, unspecified: Secondary | ICD-10-CM | POA: Diagnosis present

## 2011-05-23 DIAGNOSIS — I25110 Atherosclerotic heart disease of native coronary artery with unstable angina pectoris: Secondary | ICD-10-CM | POA: Insufficient documentation

## 2011-05-23 DIAGNOSIS — N400 Enlarged prostate without lower urinary tract symptoms: Secondary | ICD-10-CM | POA: Diagnosis present

## 2011-05-23 DIAGNOSIS — I4892 Unspecified atrial flutter: Secondary | ICD-10-CM | POA: Diagnosis present

## 2011-05-23 DIAGNOSIS — M109 Gout, unspecified: Secondary | ICD-10-CM | POA: Diagnosis present

## 2011-05-23 DIAGNOSIS — I2510 Atherosclerotic heart disease of native coronary artery without angina pectoris: Secondary | ICD-10-CM

## 2011-05-23 DIAGNOSIS — E7800 Pure hypercholesterolemia, unspecified: Secondary | ICD-10-CM | POA: Insufficient documentation

## 2011-05-23 DIAGNOSIS — I252 Old myocardial infarction: Secondary | ICD-10-CM

## 2011-05-23 DIAGNOSIS — M549 Dorsalgia, unspecified: Secondary | ICD-10-CM | POA: Diagnosis present

## 2011-05-23 DIAGNOSIS — M25519 Pain in unspecified shoulder: Secondary | ICD-10-CM | POA: Diagnosis present

## 2011-05-23 HISTORY — DX: Angina pectoris, unspecified: I20.9

## 2011-05-23 HISTORY — DX: Unspecified osteoarthritis, unspecified site: M19.90

## 2011-05-23 LAB — BASIC METABOLIC PANEL
Calcium: 9.1 mg/dL (ref 8.4–10.5)
Creat: 1.03 mg/dL (ref 0.50–1.35)
Sodium: 138 mEq/L (ref 135–145)

## 2011-05-23 LAB — MAGNESIUM: Magnesium: 2 mg/dL (ref 1.5–2.5)

## 2011-05-23 MED ORDER — CARVEDILOL 12.5 MG PO TABS
12.5000 mg | ORAL_TABLET | Freq: Two times a day (BID) | ORAL | Status: DC
  Administered 2011-05-23 – 2011-05-25 (×4): 12.5 mg via ORAL
  Filled 2011-05-23 (×5): qty 1

## 2011-05-23 MED ORDER — OMEGA-3-ACID ETHYL ESTERS 1 G PO CAPS
1.0000 g | ORAL_CAPSULE | Freq: Every day | ORAL | Status: DC
  Administered 2011-05-24 – 2011-05-26 (×2): 1 g via ORAL
  Filled 2011-05-23 (×4): qty 1

## 2011-05-23 MED ORDER — ALPRAZOLAM 0.25 MG PO TABS: 0.2500 mg | ORAL_TABLET | Freq: Two times a day (BID) | ORAL | Status: DC | PRN

## 2011-05-23 MED ORDER — ONDANSETRON HCL 4 MG/2ML IJ SOLN: 4.0000 mg | Freq: Four times a day (QID) | INTRAMUSCULAR | Status: DC | PRN

## 2011-05-23 MED ORDER — RIVAROXABAN 20 MG PO TABS: 20.0000 mg | ORAL_TABLET | Freq: Every day | ORAL | Status: DC

## 2011-05-23 MED ORDER — SODIUM CHLORIDE 0.9 % IV SOLN: 250.0000 mL | INTRAVENOUS | Status: DC | PRN

## 2011-05-23 MED ORDER — ASPIRIN 325 MG PO TABS: 325.0000 mg | ORAL_TABLET | Freq: Every day | ORAL | Status: DC

## 2011-05-23 MED ORDER — LISINOPRIL 20 MG PO TABS
20.0000 mg | ORAL_TABLET | Freq: Every day | ORAL | Status: DC
  Administered 2011-05-23 – 2011-05-25 (×3): 20 mg via ORAL
  Filled 2011-05-23 (×4): qty 1

## 2011-05-23 MED ORDER — SODIUM CHLORIDE 0.9 % IJ SOLN: 3.0000 mL | INTRAMUSCULAR | Status: DC | PRN

## 2011-05-23 MED ORDER — ALLOPURINOL 300 MG PO TABS
300.0000 mg | ORAL_TABLET | Freq: Every day | ORAL | Status: DC
  Administered 2011-05-24 – 2011-05-26 (×3): 300 mg via ORAL
  Filled 2011-05-23 (×3): qty 1

## 2011-05-23 MED ORDER — DOFETILIDE 500 MCG PO CAPS
500.0000 ug | ORAL_CAPSULE | Freq: Two times a day (BID) | ORAL | Status: DC
  Administered 2011-05-23 – 2011-05-26 (×6): 500 ug via ORAL
  Filled 2011-05-23 (×10): qty 1

## 2011-05-23 MED ORDER — TAMSULOSIN HCL 0.4 MG PO CAPS
0.4000 mg | ORAL_CAPSULE | Freq: Every day | ORAL | Status: DC
  Administered 2011-05-23 – 2011-05-25 (×3): 0.4 mg via ORAL
  Filled 2011-05-23 (×4): qty 1

## 2011-05-23 MED ORDER — ATORVASTATIN CALCIUM 40 MG PO TABS
40.0000 mg | ORAL_TABLET | Freq: Every day | ORAL | Status: DC
  Administered 2011-05-23 – 2011-05-25 (×3): 40 mg via ORAL
  Filled 2011-05-23 (×4): qty 1

## 2011-05-23 MED ORDER — SODIUM CHLORIDE 0.9 % IJ SOLN
3.0000 mL | Freq: Two times a day (BID) | INTRAMUSCULAR | Status: DC
  Administered 2011-05-23 – 2011-05-26 (×4): 3 mL via INTRAVENOUS

## 2011-05-23 MED ORDER — RIVAROXABAN 10 MG PO TABS
20.0000 mg | ORAL_TABLET | Freq: Every day | ORAL | Status: DC
  Administered 2011-05-23 – 2011-05-25 (×3): 20 mg via ORAL
  Filled 2011-05-23 (×5): qty 2

## 2011-05-23 MED ORDER — NITROGLYCERIN 0.4 MG SL SUBL: 0.4000 mg | SUBLINGUAL_TABLET | SUBLINGUAL | Status: DC | PRN

## 2011-05-23 MED ORDER — ACETAMINOPHEN 325 MG PO TABS: 650.0000 mg | ORAL_TABLET | ORAL | Status: DC | PRN

## 2011-05-23 MED ORDER — ZOLPIDEM TARTRATE 5 MG PO TABS: 5.0000 mg | ORAL_TABLET | Freq: Every evening | ORAL | Status: DC | PRN

## 2011-05-23 NOTE — Progress Notes (Signed)
  History of Present Illness: Dylan Roth is a 71 y.o. male  Patient of Dr. Graciela Husbands and [name].  He was diagnosed with atrial fibrillation at the end of last year and was originally started on diltiazem and Xarelto for anticoagulation.  While wearing a monitor in December, pt had post termination pauses up to 5 seconds, so the diltiazem was stopped and patient was maintained on lower dose of carvediolol.  With these changes, he began to have more symptomatic episodes of palpitations and so Dr. Graciela Husbands was consulted.  At that time, it was decided to start the patient on Tikosyn.    Reviewed pt's medication list.  He has not previously taken any antiarrhythmics.  He is not currently taking any QTc prolongating medications.  He is appropriately anticoagulated with Xarelto and reports compliance with this medication.  We reviewed the potential side effects of Tikosyn, including QTc prolongation, nausea and HA.  Pt is aware of the importance of compliance with this medication.   Pt has already contacted his insurance regarding the cost of the medication.  It will be $35/month, which is affordable for the patient.   EKG: sinus bradycardia with 1st degree AV block with premature atrial complexes.  QTc- 392 msec  Current Outpatient Prescriptions  Medication Sig Dispense Refill  . allopurinol (ZYLOPRIM) 300 MG tablet Take 300 mg by mouth daily.        Marland Kitchen atorvastatin (LIPITOR) 40 MG tablet TAKE ONE TABLET BY MOUTH EVERY DAY  30 tablet  6  . carvedilol (COREG) 12.5 MG tablet Take 1 tablet (12.5 mg total) by mouth 2 (two) times daily.  60 tablet  6  . lisinopril (PRINIVIL,ZESTRIL) 20 MG tablet Take 20 mg by mouth daily.        . nitroGLYCERIN (NITROSTAT) 0.4 MG SL tablet Place 0.4 mg under the tongue every 5 (five) minutes as needed.        . Omega-3 Fatty Acids (FISH OIL) 1000 MG CAPS Take 2 capsules by mouth daily.        . Rivaroxaban (XARELTO) 20 MG TABS Take 20 mg by mouth daily.  30 tablet  6  .  sildenafil (VIAGRA) 50 MG tablet Take 50 mg by mouth daily as needed.        . Tamsulosin HCl (FLOMAX) 0.4 MG CAPS Take 0.4 mg by mouth daily.          No Known Allergies

## 2011-05-23 NOTE — Assessment & Plan Note (Signed)
Labs drawn and reviewed today.  K and Mg are acceptable at 4.6 and 2.0 respectively.  INR not needed since pt on Xarelto.  SCr- 1.03; CrCl- 86 mL/min.   QTc- .  Will start patient at [facility].  He has a bed available on 2000 at [facility].

## 2011-05-23 NOTE — H&P (Signed)
History and Physical  Patient ID: Dylan Roth Patient ID: AADARSH COZORT MRN: [id number], DOB/AGE: 04-Apr-1940 71 y.o. Date of Encounter: 05/23/2011  Primary Physician: Joetta Manners, MD Primary Cardiologist: CM/SK  Chief Complaint:  Atrial fib - for Tikosyn  HPI: Mr Dylan Roth is a 71 year old male with a history of Atrial fib. He was seen on 4/15 by Dr Graciela Husbands who felt that rhythm control with Tikosyn was the best option. He was seen in the Tikosyn clinic and is being admitted for Tikosyn loading today. He is currently in atrial fibrillation with a controlled rate. He is not aware of currently being in afib but states he has had multiple episodes of light-headedness recently and also has palpitations at times. He has not had chest pain or SOB but does have some DOE.  Past Medical History  Diagnosis Date  . Atrial fibrillation/flutter   . 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 Sinus node dysfunction     post termination pauses <5sec  . Hyperlipidemia   . Hypertension   . Central obesity   . Gout   . BPH (benign prostatic hyperplasia)   . Gall stones   . Chronic back pain   . Chronic shoulder pain     Surgical History:  Past Surgical History  Procedure Date  . Coronary artery bypass graft 2005    in [place] - LIMA to LAD, SVG to diagonal, SVG to ramus, SVG to OM and SVG to RCA   . Cholecystectomy 1978       I have reviewed the patient's current medications. Current Outpatient Prescriptions on File Prior to Encounter  Medication Sig Dispense Refill  . allopurinol (ZYLOPRIM) 300 MG tablet Take 300 mg by mouth daily.        Marland Kitchen atorvastatin (LIPITOR) 40 MG tablet TAKE ONE TABLET BY MOUTH EVERY DAY  30 tablet  6  . carvedilol (COREG) 12.5 MG tablet Take 1 tablet (12.5 mg total) by mouth 2 (two) times daily.  60 tablet  6  . lisinopril (PRINIVIL,ZESTRIL) 20 MG  tablet Take 20 mg by mouth daily.        . nitroGLYCERIN (NITROSTAT) 0.4 MG SL tablet Place 0.4 mg under the tongue every 5 (five) minutes as needed.        . Omega-3 Fatty Acids (FISH OIL) 1000 MG CAPS Take 2 capsules by mouth daily.        . Rivaroxaban (XARELTO) 20 MG TABS Take 20 mg by mouth daily.  30 tablet  6  . sildenafil (VIAGRA) 50 MG tablet Take 50 mg by mouth daily as needed.        . Tamsulosin HCl (FLOMAX) 0.4 MG CAPS Take 0.4 mg by mouth daily.         Allergies: No Known Allergies, had post-termination pauses up to 5 sec on Cardizem  History   Social History  . Marital Status: Married    Spouse Name: N/A    Number of Children: 2  . Years of Education: N/A   Occupational History  . retired-truck driver   .     Social History Main Topics  . Smoking status: Former Games developer  . Smokeless tobacco: Never Used  . Alcohol Use: No     2 drinks (bourbon) a night  . Drug Use: No  . Sexually Active: Not on file  Other Topics Concern  . Not on file   Social History Narrative   Married. 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 Quit tobacco 50 years ago..Both parents are deceased, neither one had cardiac issues and no siblings have cardiac issues.    Family History  Problem Relation Age of Onset  . Cancer Mother     Review of Systems: He has not had any recent illnesses, fevers or chills. He has had no bleeding issues. He has no respiratory or GI issues. Full 14-point review of systems otherwise negative except as noted above.   Physical Exam: BP 131/82  Pulse 69  Temp(Src) 97.7 F (36.5 C) (Oral)  Resp 18  Ht 5\' 7"  (1.702 m)  Wt 195 lb 1.6 oz (88.497 kg)  BMI 30.56 kg/m2  SpO2 95%  General: Well developed, well nourished, male in no acute distress. Head: Normocephalic, atraumatic, sclera non-icteric, no xanthomas, nares are without discharge. Dentition: good Neck: No carotid bruits. JVD not elevated. No thyromegally Lungs: Good expansion bilaterally without  wheezes or rhonchi. No Rales Heart: IRRegular rate and rhythm with S1 S2.  No S3 or S4.  No murmur, no rubs, or gallops appreciated. Abdomen: Soft, non-tender, non-distended with normoactive bowel sounds. No hepatomegaly. No rebound/guarding. No obvious abdominal masses. Msk:  Strength and tone appear normal for age. No joint deformities or effusions, no spine or costo-vertebral angle tenderness. Extremities: No clubbing or cyanosis. No edema.  Distal pedal pulses are 2+ in 4 extrem Neuro: Alert and oriented X 3. Moves all extremities spontaneously. No focal deficits noted. Psych:  Responds to questions appropriately with a normal affect. Skin: No rashes or lesions noted  Labs:   Lab Results  Component Value Date   WBC 8.1 01/10/2011   HGB 15.6 01/10/2011   HCT 45.7 01/10/2011   MCV 98.6 01/10/2011   PLT 175.0 01/10/2011     Lab 05/23/11 0956  NA 138  K 4.6  CL 102  CO2 27  BUN 24*  CREATININE 1.03  CALCIUM 9.1  PROT --  BILITOT --  ALKPHOS --  ALT --  AST --  GLUCOSE 80   Magnesium  Date Value Range Status  05/23/2011 2.0  1.5-2.5 (mg/dL) Final   ECG: Per Weston Brass note - sinus bradycardia with 1st degree AV block with premature atrial complexes. QTc- 392 msec   ASSESSMENT AND PLAN:  Principal Problem:  *Atrial fibrillation/flutter Active Problems:  Anticoagulant long-term use  Pt is here for Tikosyn load. He is currently in afib, with a controlled ventricular rate. He is compliant with his medications and is anticoagulated. His labs and ECG today were acceptable for Tikosyn. Will continue his Xarelto, add Tikosyn and consider DCCV in 48 hours if still in afib.   Signed, Bjorn Loser Barrett PA-C 05/23/2011, 3:36 PM   I have seen, examined the patient, and reviewed the above assessment and plan.  The patient has symptomatic persistent atrial fibrillation.  Exam as per above.  NAD, OP clear, CTAB, iRRR, s/nt/nd +BS Will intiate tikosyn and follow  Co Sign: Hillis Range, MD 05/23/2011 5:40 PM

## 2011-05-24 ENCOUNTER — Other Ambulatory Visit: Payer: Self-pay

## 2011-05-24 DIAGNOSIS — I4892 Unspecified atrial flutter: Secondary | ICD-10-CM

## 2011-05-24 LAB — BASIC METABOLIC PANEL
BUN: 23 mg/dL (ref 6–23)
Chloride: 102 mEq/L (ref 96–112)
Creatinine, Ser: 1.18 mg/dL (ref 0.50–1.35)
GFR calc Af Amer: 71 mL/min — ABNORMAL LOW (ref >90)
Glucose, Bld: 81 mg/dL (ref 70–99)

## 2011-05-24 NOTE — Progress Notes (Signed)
  Patient Name: Dylan Roth      SUBJECTIVE: Admitted for dofetilide   because of symptomatic atrial fibrillation.  He has a history of post termination pauses and we have been discussed the potential need for pacing. Is known coronary artery disease and status post CABG with normal left ventricular function  Continues to have paroxysms of atrial fibrillation, albeit without significant post termination pauses. He does however recount prior to admission having recurrent presyncope which is certainly worrisome in the context of his post termination pauses.   Thus far he is tolerating his dofetilide well. He did have recurrent atrial fibrillation  Past Medical History  Diagnosis Date  . Atrial fibrillation/flutter   . 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 Sinus node dysfunction     post termination pauses <5sec  . Hyperlipidemia   . Hypertension   . Central obesity   . Gout     "once; I have it under control w/medicine"  . BPH (benign prostatic hyperplasia)   . Gall stones   . Chronic back pain   . Chronic shoulder pain   . Angina   . Arthritis     "in my back"    PHYSICAL EXAM Filed Vitals:   05/23/11 1623 05/23/11 1625 05/23/11 2011 05/24/11 0433  BP: 131/82  126/82 95/62  Pulse: 69  56 59  Temp: 97.7 F (36.5 C)  97.8 F (36.6 C) 97.8 F (36.6 C)  TempSrc: Oral  Oral Oral  Resp: 18  18 18   Height:  5\' 7"  (1.702 m)    Weight:  195 lb 1.6 oz (88.497 kg)  194 lb 14.2 oz (88.4 kg)  SpO2: 95%  96% 94%   .prd Well developed and nourished in no acute distress HENT normal Neck supple with JVP-flat Clear Regular rate and rhythm, no murmurs or gallops Abd-soft with active BS No Clubbing cyanosis edema Skin-warm and dry A & Oriented  Grossly normal sensory and motor function   TELEMETRY: Reviewed telemetry pt in sinus with paroxysms of atrial fibrillation was  controlled with some rapid ventricular response and sinus node dysfunction with junctional status following termination:   No intake or output data in the 24 hours ending 05/24/11 0836  LABS: Basic Metabolic Panel:  Lab 05/24/11 7829 05/23/11 0956  NA 137 138  K 4.6 4.6  CL 102 102  CO2 27 27  GLUCOSE 81 80  BUN 23 24*  CREATININE 1.18 1.03  CALCIUM 9.1 9.1  MG 2.1 2.0  PHOS -- --     ASSESSMENT AND PLAN:  Patient Active Hospital Problem List: Atrial fibrillation/flutter ()     Essential hypertension, benign (10/16/2008)   Coronary Artery Disease  s./p CABG 2005 (10/16/2008)   Anticoagulant long-term use (05/23/2011)  we will continue his dofetilide. We'll watch for right now regarding post termination pauses prior to making a decision to proceed with pacing although my threshold for doing that is very low.  He n.p.o. in the morning until I see him to make that decision      Signed, Sherryl Manges MD  05/24/2011

## 2011-05-24 NOTE — Progress Notes (Signed)
UR Completed. Simmons, Hayven Croy F [phone]  

## 2011-05-25 ENCOUNTER — Other Ambulatory Visit: Payer: Self-pay

## 2011-05-25 ENCOUNTER — Encounter (HOSPITAL_COMMUNITY)
Admission: AD | Disposition: A | Discharge status: Home / Self Care | Payer: Self-pay | Source: Ambulatory Visit | Attending: Internal Medicine

## 2011-05-25 DIAGNOSIS — I498 Other specified cardiac arrhythmias: Secondary | ICD-10-CM

## 2011-05-25 HISTORY — PX: PERMANENT PACEMAKER INSERTION: SHX5480

## 2011-05-25 LAB — BASIC METABOLIC PANEL
Calcium: 9.2 mg/dL (ref 8.4–10.5)
Chloride: 102 mEq/L (ref 96–112)
Creatinine, Ser: 1.12 mg/dL (ref 0.50–1.35)
GFR calc Af Amer: 76 mL/min — ABNORMAL LOW (ref >90)
Sodium: 136 mEq/L (ref 135–145)

## 2011-05-25 LAB — CBC
Hemoglobin: 15.1 g/dL (ref 13.0–17.0)
MCH: 32.8 pg (ref 26.0–34.0)
Platelets: 190 10*3/uL (ref 150–400)
RBC: 4.6 MIL/uL (ref 4.22–5.81)

## 2011-05-25 LAB — MAGNESIUM: Magnesium: 2.1 mg/dL (ref 1.5–2.5)

## 2011-05-25 SURGERY — PERMANENT PACEMAKER INSERTION
Anesthesia: LOCAL

## 2011-05-25 MED ORDER — SODIUM CHLORIDE 0.9 % IV SOLN
250.0000 mL | INTRAVENOUS | Status: DC
  Administered 2011-05-25: 250 mL via INTRAVENOUS

## 2011-05-25 MED ORDER — CEFAZOLIN SODIUM 1-5 GM-% IV SOLN
1.0000 g | Freq: Four times a day (QID) | INTRAVENOUS | Status: AC
  Administered 2011-05-25 – 2011-05-26 (×3): 1 g via INTRAVENOUS
  Filled 2011-05-25 (×3): qty 50

## 2011-05-25 MED ORDER — MIDAZOLAM HCL 2 MG/2ML IJ SOLN
INTRAMUSCULAR | Status: AC
  Filled 2011-05-25: qty 2

## 2011-05-25 MED ORDER — HEPARIN (PORCINE) IN NACL 2-0.9 UNIT/ML-% IJ SOLN
INTRAMUSCULAR | Status: AC
  Filled 2011-05-25: qty 1000

## 2011-05-25 MED ORDER — ACETAMINOPHEN 325 MG PO TABS: 325.0000 mg | ORAL_TABLET | ORAL | Status: DC | PRN

## 2011-05-25 MED ORDER — FENTANYL CITRATE 0.05 MG/ML IJ SOLN
INTRAMUSCULAR | Status: AC
  Filled 2011-05-25: qty 2

## 2011-05-25 MED ORDER — CEFAZOLIN SODIUM-DEXTROSE 2-3 GM-% IV SOLR
2.0000 g | INTRAVENOUS | Status: AC
  Administered 2011-05-25: 2 g via INTRAVENOUS
  Filled 2011-05-25: qty 50

## 2011-05-25 MED ORDER — SODIUM CHLORIDE 0.9 % IR SOLN
80.0000 mg | Status: DC
  Administered 2011-05-25: 80 mg
  Filled 2011-05-25: qty 2

## 2011-05-25 MED ORDER — CHLORHEXIDINE GLUCONATE 4 % EX LIQD
60.0000 mL | Freq: Once | CUTANEOUS | Status: AC
  Administered 2011-05-25: 4 via TOPICAL
  Filled 2011-05-25: qty 60

## 2011-05-25 MED ORDER — CARVEDILOL 25 MG PO TABS
25.0000 mg | ORAL_TABLET | Freq: Two times a day (BID) | ORAL | Status: DC
  Administered 2011-05-25 – 2011-05-26 (×2): 25 mg via ORAL
  Filled 2011-05-25 (×3): qty 1

## 2011-05-25 MED ORDER — ONDANSETRON HCL 4 MG/2ML IJ SOLN: 4.0000 mg | Freq: Four times a day (QID) | INTRAMUSCULAR | Status: DC | PRN

## 2011-05-25 MED ORDER — SODIUM CHLORIDE 0.45 % IV SOLN
INTRAVENOUS | Status: DC
  Administered 2011-05-25: 12:00:00 via INTRAVENOUS

## 2011-05-25 MED ORDER — SODIUM CHLORIDE 0.9 % IV SOLN
INTRAVENOUS | Status: DC
  Administered 2011-05-25: 18:00:00 via INTRAVENOUS

## 2011-05-25 MED ORDER — SODIUM CHLORIDE 0.9 % IJ SOLN: 3.0000 mL | Freq: Two times a day (BID) | INTRAMUSCULAR | Status: DC

## 2011-05-25 MED ORDER — SODIUM CHLORIDE 0.9 % IJ SOLN: 3.0000 mL | INTRAMUSCULAR | Status: DC | PRN

## 2011-05-25 MED ORDER — LIDOCAINE HCL (PF) 1 % IJ SOLN
INTRAMUSCULAR | Status: AC
  Filled 2011-05-25: qty 60

## 2011-05-25 NOTE — CV Procedure (Signed)
Preop DX:: sinus brady and post termination pauses with presyncope Post op DX:: same  Procedure  dual pacemaker implantation  After routine prep and drape, lidocaine was infiltrated in the prepectoral subclavicular region on the left side an incision was made and carried down to later the prepectoral fascia using electrocautery and sharp dissection a pocket was formed similarly. Hemostasis was obtained.  After this, we turned our attention to gaining accessm to the extrathoracic,left subclavian vein. This was accomplished without difficulty and without the aspiration of air or puncture of the artery. 2 separate venipunctures were accomplished; guidewires were placed and retained and sequentially 7 French sheath through which were  passed an [facility] 2088 ventricular lead serial numberCAW [id number] and an [facility] Jude  atrial lead serial number [id number] .  The ventricular lead was manipulated to the right ventricular apex with a bipolar R wave was 15.3, the pacing impedance was 817, the threshold was 1.2 @ 0.4 msec  Current at threshold was   1.5 ma  and the current of injury was brisk.  The right atrial lead was manipulated to the right atrial free wall  with a bipolar P-wave  4.9, the pacing impedance was 579, the threshold 1.4@ 0.4 msec   Current at threshold was 2.6 ma and the current of injury was brisk.   . The leads were affixed to the prepectoral fascia and attached to a  [employer] Jude ACCENT pulse generator serial number [id number]  V pacing and then a pacing were identified.  Hemostasis was obtained. The pocket was copiously irrigated with antibiotic containing saline solution. The leads and the pulse generator were placed in the pocket and affixed to the prepectoral fascia. The wound is then closed in 3 layers in normal fashion. The wound is washed dried and a benzoin Steri-Strip this was applied the account sponge counts and instrument counts were correct at the end of the procedure .Marland Kitchen The patient  tolerated the procedure without apparent complication.  Gerlene Burdock.D.

## 2011-05-25 NOTE — Progress Notes (Signed)
  Patient Name: Dylan Roth      SUBJECTIVE: Admitted for dofetilide   because of symptomatic atrial fibrillation.  He has a history of post termination pauses and we have been discussed the potential need for pacing. Is known coronary artery disease and status post CABG with normal left ventricular function  No further afib but with HR 45-55   Past Medical History  Diagnosis Date  . Atrial fibrillation/flutter   . 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 Sinus node dysfunction     post termination pauses <5sec  . Hyperlipidemia   . Hypertension   . Central obesity   . Gout     "once; I have it under control w/medicine"  . BPH (benign prostatic hyperplasia)   . Gall stones   . Chronic back pain   . Chronic shoulder pain   . Angina   . Arthritis     "in my back"    PHYSICAL EXAM Filed Vitals:   05/24/11 2221 05/24/11 2223 05/25/11 0448 05/25/11 0449  BP: 128/78 128/78 103/62   Pulse: 53  53   Temp:   97.7 F (36.5 C)   TempSrc:   Oral   Resp:   18   Height:      Weight:    192 lb 10.9 oz (87.4 kg)  SpO2:   94%      Well developed and nourished in no acute distress HENT normal Neck supple with JVP-flat Clear Slow but regular rate and rhythm, no murmurs or gallops Abd-soft with active BS No Clubbing cyanosis edema Skin-warm and dry A & Oriented  Grossly normal sensory and motor function   TELEMETRY: Reviewed telemetry pt in sinus brady   Intake/Output Summary (Last 24 hours) at 05/25/11 0954 Last data filed at 05/25/11 0900  Gross per 24 hour  Intake   1083 ml  Output      6 ml  Net   1077 ml    LABS: Basic Metabolic Panel:  Lab 05/25/11 2130 05/24/11 0610 05/23/11 0956  NA 136 137 138  K 4.3 4.6 4.6  CL 102 102 102  CO2 26 27 27   GLUCOSE 77 81 80  BUN 22 23 24*  CREATININE 1.12 1.18 1.03  CALCIUM 9.2 9.1 --  MG 2.1 2.1 --  PHOS -- --  --     ASSESSMENT AND PLAN:  Patient Active Hospital Problem List: Atrial fibrillation/flutter ()     Essential hypertension, benign (10/16/2008)   Coronary Artery Disease  s./p CABG 2005 (10/16/2008)   Anticoagulant long-term use (05/23/2011)  Have reviewed the issues related to bradycardia and the implications for pacing 1) post termination pauses and presyncope 2) brady making rate control difficult 3) proarrhythmia aggravated potentially by bradycardia  The benefits and risks were reviewed including but not limited to death,  perforation, infection, lead dislodgement and device malfunction.  The patient understands agrees and is willing to proceed.      Signed, Sherryl Manges MD  05/25/2011

## 2011-05-25 NOTE — Progress Notes (Signed)
Left messages for wife and at this time unable to reach.  Message left is that [name] has gone done for his pacemaker insertion.at 1145.  Thanks, [employer]

## 2011-05-26 ENCOUNTER — Inpatient Hospital Stay (HOSPITAL_COMMUNITY): Payer: Medicare Other

## 2011-05-26 LAB — BASIC METABOLIC PANEL
CO2: 22 mEq/L (ref 19–32)
Calcium: 8.9 mg/dL (ref 8.4–10.5)
GFR calc non Af Amer: 83 mL/min — ABNORMAL LOW (ref >90)
Sodium: 137 mEq/L (ref 135–145)

## 2011-05-26 LAB — MAGNESIUM: Magnesium: 1.9 mg/dL (ref 1.5–2.5)

## 2011-05-26 MED ORDER — MAGNESIUM OXIDE 400 MG PO TABS: 400.0000 mg | ORAL_TABLET | ORAL | Status: DC

## 2011-05-26 MED ORDER — SILDENAFIL CITRATE 50 MG PO TABS: 50.0000 mg | ORAL_TABLET | Freq: Every day | ORAL | Status: DC | PRN

## 2011-05-26 MED ORDER — DOFETILIDE 500 MCG PO CAPS: 500.0000 ug | ORAL_CAPSULE | Freq: Two times a day (BID) | ORAL | Status: DC

## 2011-05-26 MED ORDER — CARVEDILOL 12.5 MG PO TABS: 18.7500 mg | ORAL_TABLET | Freq: Two times a day (BID) | ORAL | Status: DC

## 2011-05-26 NOTE — Progress Notes (Signed)
Nurse upon assessment observed that the pacemaker drsg on the patient's (L) clavicle was more saturated with blood compared to earlier in the shift. Patient stated that he was experiencing some soreness and pain at the site, however, stated that the pain was bearable. Nurse also observed some puffiness and swelling around the site. Nurse marked the area of puffiness and swelling. Nurse was instructed to call the Doctor on call to inform him of the changes. Nurse paged Dr. Jones Broom. Dr. Jones Broom instructed the nurse to apply a pressure drsg to the site (L clavicle) and monitor throughout the night. Nurse performed duty as was instructed. Harmon Pier

## 2011-05-26 NOTE — Progress Notes (Signed)
   ELECTROPHYSIOLOGY ROUNDING NOTE    Patient Name: Dylan Roth Date of Encounter: 05-26-2011    SUBJECTIVE:Patient feels well.  No chest pain or shortness of breath.  Minimal incisional soreness.  S/P PPM implant 4-25, being loaded on Tikosyn.  TELEMETRY: Reviewed telemetry pt in A pacing with intrinsic rhythm, 14% afib per device interrogation.  Filed Vitals:   05/25/11 2138 05/25/11 2139 05/26/11 0055 05/26/11 0500  BP: 120/73 120/73 98/67 106/69  Pulse: 60  60 60  Temp:   98.2 F (36.8 C) 97.6 F (36.4 C)  TempSrc:   Oral Oral  Resp:   18 18  Height:      Weight:    193 lb 12.6 oz (87.9 kg)  SpO2:   94% 93%    Intake/Output Summary (Last 24 hours) at 05/26/11 0816 Last data filed at 05/25/11 2140  Gross per 24 hour  Intake   1218 ml  Output      3 ml  Net   1215 ml   Well developed and nourished in no acute distress HENT normal Neck supple with JVP-flat Clear Pocket with no swelling but some blood on bandage Regular rate and rhythm, no murmurs or gallops Abd-soft with active BS No Clubbing cyanosis edema Skin-warm and dry A & Oriented  Grossly normal sensory and motor function  LABS: Basic Metabolic Panel:  Basename 05/26/11 0515 05/25/11 0550  NA 137 136  K 4.6 4.3  CL 103 102  CO2 22 26  GLUCOSE 75 77  BUN 22 22  CREATININE 0.96 1.12  CALCIUM 8.9 9.2  MG 1.9 2.1  PHOS -- --   CBC:  Basename 05/25/11 1221  WBC 7.6  NEUTROABS --  HGB 15.1  HCT 43.4  MCV 94.3  PLT 190    Radiology/Studies:  Dg Chest 2 View 05/26/2011  *RADIOLOGY REPORT*  Clinical Data: Postop from pacemaker placement.  Coronary artery disease.  CHEST - 2 VIEW  Comparison: None.  Findings: A dual lead transvenous pacemaker is seen with leads within the right atrium and right ventricle.  No evidence of pneumothorax.  Both lungs are clear.  Heart size is within normal limits.  No mass or lymphadenopathy identified.  IMPRESSION: Dual lead transvenous pacemaker in appropriate  position.  No evidence of pneumothorax or other active disease.  Original Report Authenticated By: Danae Orleans, M.D.   EKG: a paced, v sense rate 60 intervals .28/.09/.47     DEVICE INTERROGATION: Device interrogated by industry.  Lead values including impedence, sensing, threshold within normal values.    Principal Problem:  *Atrial fibrillation/flutter Active Problems:  HYPERCHOLESTEROLEMIA  Essential hypertension, benign  Coronary Artery Disease  s./p CABG 2005  Anticoagulant long-term use   Wound care, arm mobility, restrictions reviewed with patient.  Follow up appointments scheduled in [place].    Discharge today Wound check 2 week SK 4 weeks with bmet/mg

## 2011-05-26 NOTE — Discharge Summary (Signed)
Patient ID: Dylan Roth  MRN: [id number], DOB/AGE: 1940/11/26 71 y.o.  Admit date: 05/23/2011  Discharge date: 05/26/2011  Primary Care Provider: [facility]  Primary Cardiologist: CM/SK  Discharge Diagnoses  1. Atrial fibrillation/flutter s/p Tikosyn load  2. Sinus node dysfunction with post termination pauses >5 seconds s/p PPM implant  3. CAD s/p CABG  4. HTN  5. Dyslipidemia  6. Chronic back pain  7. Obesity  8. BPH  9. Gout  Procedures  1. Tikosyn drug load initiated 05/23/2011  2. Dual chamber PPM implantation 05/25/2011 - St Jude ACCENT pulse generator serial number [id number], St Jude  atrial lead serial number [id number], St Jude 2088 ventricular lead serial [id number]  History and Hospital Course  For full details, please see admission H&P dictated by Dr. Hillis Range. Briefly, Dylan Roth is a 71 year old gentleman with atrial fibrillation who was experiencing symptoms of palpitations and light-headedness and found to have recurrent AF. He was evaluated by Dr. Graciela Husbands as an outpatient at which time it was recommended he start antiarrhythmic drug therapy. He was then admitted on 05/23/2011 for Tikosyn drug loading. His Xarelto was continued for chronic anticoagulation. During his hospitalization, he experienced sinus node dysfunction with post termination pauses >5 seconds. He then underwent dual chamber PPM implantation on 05/25/2011. He has remained hemodynamically stable and is tolerating his medical therapy well. His implant site is intact with no evidence of bleeding or hematoma. His device was interrogated this AM which showed normal PPM function and stable lead parameters. He has been seen, evaluated and deemed stable for discharge today by Dr. Sherryl Manges. Of note, per Dr. Odessa Fleming recommendations, his aspirin was discontinued this admission.  Discharge Vitals  Blood pressure 106/69, pulse 60, temp 97.6 F (36.4 C), respirations 18, height 5\' 7"  (1.702 m), weight 193  lb 12.6 oz (87.9 kg), SpO2 93%.  Labs/diagnostics in last 24 hours  Basic Metabolic Panel   Basename  05/26/11 0515  05/25/11 0550   NA  137  136   K  4.6  4.3   CL  103  102   CO2  22  26   GLUCOSE  75  77   BUN  22  22   CREATININE  0.96  1.12   CALCIUM  8.9  9.2   MG  1.9  2.1   PHOS  --  --    Chest X-ray  05/26/2011 - Dual lead transvenous pacemaker in appropriate position. No evidence of pneumothorax or other active disease. Original Report Authenticated By: Danae Orleans, M.D.  Disposition  Patient is being discharged home today in improved, stable condition.  Follow-up Plans & Appointments  1. Wound check 2 weeks - 06/05/2011 at 2:30 PM  2. Dr. Sherryl Manges 4 weeks with labs (BMP, Mg) - 07/07/2011 at 10:15 AM   Discharge Medications  Medication List  As of 05/26/2011 10:23 AM    STOP taking these medications          aspirin 325 MG tablet       TAKE these medications          allopurinol 300 MG tablet      Commonly known as: ZYLOPRIM      Take 300 mg by mouth daily.      atorvastatin 40 MG tablet      Commonly known as: LIPITOR      Take 40 mg by mouth at bedtime.      carvedilol 12.5 MG tablet  Commonly known as: COREG      Take 1.5 tablets (18.75 mg total) by mouth 2 (two) times daily.      dofetilide 500 MCG capsule      Commonly known as: TIKOSYN      Take 1 capsule (500 mcg total) by mouth every 12 (twelve) hours.      Fish Oil 1000 MG Caps      Take 1 capsule by mouth daily.      FLOMAX 0.4 MG Caps      Generic drug: Tamsulosin HCl      Take 0.4 mg by mouth at bedtime.      lisinopril 20 MG tablet      Commonly known as: PRINIVIL,ZESTRIL      Take 20 mg by mouth at bedtime.      magnesium oxide 400 MG tablet      Commonly known as: MAG-OX      Take 1 tablet (400 mg total) by mouth every Monday, Wednesday, and Friday.      nitroGLYCERIN 0.4 MG SL tablet      Commonly known as: NITROSTAT      Place 0.4 mg under the tongue every 5 (five) minutes  as needed. For chest pain      Rivaroxaban 20 MG Tabs      Take 20 mg by mouth at bedtime.      sildenafil 50 MG tablet      Commonly known as: VIAGRA      Take 1 tablet (50 mg total) by mouth daily as needed. For erectile dysfunction         Duration of Discharge Encounter  Greater than 30 minutes including physician time.  Signed, Sherryl Manges

## 2011-05-26 NOTE — Discharge Instructions (Addendum)
DO NOT start any new medications (prescription and/or over-the-counter medications) without notifying Dr. Viviann Spare Klein's office [facility] [phone])    Supplemental Discharge Instructions for  Pacemaker/Defibrillator Patients  Activity No heavy lifting or vigorous activity with your left/right arm for 6 to 8 weeks.  Do not raise your left/right arm above your head for one week.  Gradually raise your affected arm as drawn below.              04/28                    04/29                       04/30                      05/01       NO DRIVING for 1 week; you may begin driving on 09/81/1914. WOUND CARE   Keep the wound area clean and dry.  Do not get this area wet for one week. No showers for one week; you may shower on 06/02/2011.   The tape/steri-strips on your wound will fall off; do not pull them off.  No bandage is needed on the site.  DO  NOT apply any creams, oils, or ointments to the wound area.   If you notice any drainage or discharge from the wound, any swelling or bruising at the site, or you develop a fever > 101? F after you are discharged home, call the office at once.  Special Instructions   You are still able to use cellular telephones; use the ear opposite the side where you have your pacemaker/defibrillator.  Avoid carrying your cellular phone near your device.   When traveling through airports, show security personnel your identification card to avoid being screened in the metal detectors.  Ask the security personnel to use the hand wand.   Avoid arc welding equipment, MRI testing (magnetic resonance imaging), TENS units (transcutaneous nerve stimulators).  Call the office for questions about other devices.   Avoid electrical appliances that are in poor condition or are not properly grounded.   Microwave ovens are safe to be near or to operate.  Additional information for defibrillator patients should your device go off:   If your device goes off ONCE and you  feel fine afterward, notify the device clinic nurses.   If your device goes off ONCE and you do not feel well afterward, call 911.   If your device goes off TWICE, call 911.   If your device goes off THREE times in one day, call 911.  DO NOT DRIVE YOURSELF OR A FAMILY MEMBER WITH A DEFIBRILLATOR TO THE HOSPITAL--CALL 911.   Pacemaker Implantation A pacemaker (pacer) is a small device that acts as a backup or takes over for the natural pacemaker of the heart. The heart has its own electrical system to regulate the beating of the heart muscles. The heart pumps best when it beats in a regular, coordinated rhythm.  A pacer consists of a small device (generator) which produces electrical signals that tell your heart to beat. The generator contains a lithium battery and a tiny computer. Wires (leads) connect the generator to the heart. The pacer is placed under the skin through a small cut. It senses every heartbeat and only fires when the heart rate falls outside certain levels. When the pacer triggers a heart beat, it is called "capture." PROBLEMS THAT  MAY BE HELPED BY A PACEMAKER:  Your heart rate is sometimes too slow or irregular.   Fainting, dizziness, weakness or confusion as a result of low blood flow.   Shortness of breath.   Chest pain or angina if the heart needs more blood and oxygen.   Disturbed sleep as a result of abnormal heart rhythm.   Palpitations or the feeling that the heart is beating too fast, too hard or in an irregular way.   Weak heart muscle pumping ability.  PROCEDURE   The pacer may be placed under the skin near the collarbone, while you are under sedation. An abdominal wall location may be another option.   The leads are inserted into a vein that lies just under the collarbone, then guided into place under x-ray. The tips of the wires touch the inside of the heart. The near end of the pacer wires are connected to the generator under the skin.   For individuals  with thinner chest walls, it may be possible to feel the device under the skin, and a slight bump may be seen.  HOME CARE INSTRUCTIONS   Keep the incision dry for a week after the procedure.   For about 8 weeks, avoid sudden or jerky movements that pull your arm away from your body. This could change the position of the leads.   Take medicine exactly as directed.   Learn how to check your pulse. Follow directions about when to call or be concerned.   Be physically active every day. Ask your caregiver how and when to increase activity.   Household appliances do not interfere with pacemakers.   Travel by car, train or airplane should not be a problem. Carry a pacemaker ID card in case the device sets off a metal detector.  PACEMAKER CARE:  Avoid putting pressure over the area where the pacer was put in.   Digital cell phones should be kept 12 inches away from the pacemaker. Hold them at the ear on the side opposite of the pacer.   Never leave a cell phone in a pocket over the pacemaker.   Avoid strong electro-magnetic fields. You will not be able to have an MRI scan because of the strong magnets.   Pacer batteries last about 5 years and give off warning signals when they are running low on power. Pacers may be checked every 3 months. This allows plenty of time to change the generator when it is running low on power.   Changing the battery means removing the old generator through the same cut and plugging the existing wires into the new generator.   An EKG or heart monitor is used to see if your pacer is working properly. Sometimes signals may be sent over a land line phone to your clinic.   Tell emergency responders that you have a pacemaker.  SEEK MEDICAL CARE IF:  You begin to gain weight and your feet and ankles swell.   You have dizzy spells or feel weak.   Your pulse rate drops below the limit or is too fast.  SEEK IMMEDIATE MEDICAL CARE IF:  You faint or pass out.    You have chest pain or shortness of breath.   You are injured and think your pacemaker may have been damaged.   You are suddenly very tired or have pain in your back.   You are worried that your heart is not beating right or cannot feel your pulse.  Document Released: 01/06/2002 Document  Revised: 01/05/2011 Document Reviewed: 03/15/2007 [facility] Patient Information 2012 [place], [place].

## 2011-05-26 NOTE — Progress Notes (Signed)
   CARE MANAGEMENT NOTE 05/26/2011  Patient:  FREEMON, BINFORD   Account Number:  [id number]  Date Initiated:  05/24/2011  Documentation initiated by:  GRAVES-BIGELOW,Ival Pacer  Subjective/Objective Assessment:   Pt admitted for Tikosyn Load. Pt states he uses [facility] [address] in [place].     Action/Plan:   [facility]. CM did call [facility] off [address] to see if they can order medication and they can. They have medication in stock. Called [facility] & can order.   Anticipated DC Date:  05/26/2011   Anticipated DC Plan:  HOME/SELF CARE      DC Planning Services  Medication Assistance  CM consult      Choice offered to / List presented to:             Status of service:  Completed, signed off Medicare Important Message given?   (If response is "NO", the following Medicare IM given date fields will be blank) Date Medicare IM given:   Date Additional Medicare IM given:    Discharge Disposition:  HOME/SELF CARE  Per UR Regulation:    If discussed at Long Length of Stay Meetings, dates discussed:    Comments:  05-26-11 [address] Tomi Bamberger, RN,BSN [phone] CM did make pt aware of co pay cost and where he can purchase medicaitons.  05-25-11 [address] Tomi Bamberger, RN,BSN [phone] CM did do a beneftis check and co pay cost: TIKOSYN 500, 250, AND 125 MCG WILL COST $45 FOR A 30 DAY SUPPLY AT [facility]  TIKOSYN 500, 250, AND WILL COST #35 FOR A 30 DAY SUPPLY AT [facility]  Will make pt aware once  he returns to room. Thanks- MD please write  Rx for 7 day free Tikosyn to be filled in main pharmacy and then the original Rx with refills. Please contact CM to assist with needs. Thanks

## 2011-05-31 ENCOUNTER — Institutional Professional Consult (permissible substitution): Payer: Medicare Other | Admitting: Internal Medicine

## 2011-06-02 ENCOUNTER — Telehealth: Payer: Self-pay | Admitting: Internal Medicine

## 2011-06-02 NOTE — Telephone Encounter (Signed)
Pt had pacemaker placed last week and had questions about it, pls call

## 2011-06-02 NOTE — Telephone Encounter (Signed)
Spoke with pt---pt has a good amount of bruising at site. Pt is on Xarelto. Pt also has complaints of palpitations not as bad as before the pacemaker. Pt is on tikosyn. Explained to pt that when he comes in for check we will be able to look at any episodes recorded and see where the heart rates have been since implant. Pt understands and is aware of appt.

## 2011-06-05 ENCOUNTER — Encounter: Payer: Self-pay | Admitting: Internal Medicine

## 2011-06-05 ENCOUNTER — Ambulatory Visit (INDEPENDENT_AMBULATORY_CARE_PROVIDER_SITE_OTHER): Payer: Medicare Other | Admitting: *Deleted

## 2011-06-05 DIAGNOSIS — Z950 Presence of cardiac pacemaker: Secondary | ICD-10-CM | POA: Insufficient documentation

## 2011-06-05 DIAGNOSIS — I495 Sick sinus syndrome: Secondary | ICD-10-CM

## 2011-06-05 LAB — PACEMAKER DEVICE OBSERVATION
AL AMPLITUDE: 1.9 mv
AL THRESHOLD: 0.625 V
DEVICE MODEL PM: 7322329
RV LEAD IMPEDENCE PM: 575 Ohm
RV LEAD THRESHOLD: 0.75 V

## 2011-06-05 NOTE — Progress Notes (Signed)
Wound check-PPM 

## 2011-06-06 ENCOUNTER — Telehealth: Payer: Self-pay | Admitting: Internal Medicine

## 2011-06-06 NOTE — Telephone Encounter (Signed)
Pt came in for appointment and pressure dressing was put on. Very little bleeding at this time but did bleed overnight. Pt scheduled for wound check on 06-07-11 @ 1400. Pt aware of appt.

## 2011-06-06 NOTE — Telephone Encounter (Signed)
Pt calling c/o bleeding and oozing from wound site, on blood thinner, per kristen pt needs to come in and see her, will be here between 1030 and 1100/mt

## 2011-06-07 ENCOUNTER — Ambulatory Visit (INDEPENDENT_AMBULATORY_CARE_PROVIDER_SITE_OTHER): Payer: Medicare Other | Admitting: *Deleted

## 2011-06-07 DIAGNOSIS — R0989 Other specified symptoms and signs involving the circulatory and respiratory systems: Secondary | ICD-10-CM

## 2011-06-07 DIAGNOSIS — I495 Sick sinus syndrome: Secondary | ICD-10-CM

## 2011-06-09 ENCOUNTER — Telehealth: Payer: Self-pay | Admitting: *Deleted

## 2011-06-09 NOTE — Telephone Encounter (Signed)
Per patient's wife, he has had some oozing from the same area as was checked 06/07/11 by Dr. Ladona Ridgel.  Patient was instructed to come over to the office today.  There is oozing of dark red blood from the proximal end of the incision.  Per Dr. Ladona Ridgel  I reapplied a pressure dressing and instructed the patient to continue to hold his xarelto until he returns 06/13/11@ 11am to see Dr. Graciela Husbands.  If dressing becomes soaked he is to go to [facility].  Patient understands and is in agreement.

## 2011-06-13 ENCOUNTER — Ambulatory Visit (INDEPENDENT_AMBULATORY_CARE_PROVIDER_SITE_OTHER): Payer: Medicare Other | Admitting: Internal Medicine

## 2011-06-13 ENCOUNTER — Encounter: Payer: Self-pay | Admitting: Internal Medicine

## 2011-06-13 VITALS — BP 112/74 | HR 60 | Ht 67.0 in | Wt 198.8 lb

## 2011-06-13 DIAGNOSIS — Z950 Presence of cardiac pacemaker: Secondary | ICD-10-CM

## 2011-06-13 DIAGNOSIS — I4891 Unspecified atrial fibrillation: Secondary | ICD-10-CM

## 2011-06-13 DIAGNOSIS — I2510 Atherosclerotic heart disease of native coronary artery without angina pectoris: Secondary | ICD-10-CM

## 2011-06-13 MED ORDER — NITROGLYCERIN 0.4 MG SL SUBL: 0.4000 mg | SUBLINGUAL_TABLET | SUBLINGUAL | Status: DC | PRN

## 2011-06-13 NOTE — Patient Instructions (Signed)
Your physician has recommended you make the following change in your medication:  1) hold Xarelto until Dr. Graciela Husbands sees you in 2 weeks.  Your physician recommends that you schedule a follow-up appointment in: 2 weeks with Dr. Graciela Husbands.

## 2011-06-13 NOTE — Progress Notes (Signed)
  HPI  Dylan Roth is a 71 y.o. male Seen because of pacer site oozing following implant 4/13  Also has Afib on tikosyn  Much less afib    Past Medical History  Diagnosis Date  . Atrial fibrillation/flutter   . 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 Sinus node dysfunction     post termination pauses <5sec  . Hyperlipidemia   . Hypertension   . Central obesity   . Gout     "once; I have it under control w/medicine"  . BPH (benign prostatic hyperplasia)   . Gall stones   . Chronic back pain   . Chronic shoulder pain   . Angina   . Arthritis     "in my back"    Past Surgical History  Procedure Date  . Coronary artery bypass graft 2005    in [place] - LIMA to LAD, SVG to diagonal, SVG to ramus, SVG to OM and SVG to RCA   . Cholecystectomy 1978   . Cardiac catheterization 2005    "that what sent me to CABG"  . Cataract extraction w/ intraocular lens  implant, bilateral ~ 01/2011    Current Outpatient Prescriptions  Medication Sig Dispense Refill  . allopurinol (ZYLOPRIM) 300 MG tablet Take 300 mg by mouth daily.        Marland Kitchen atorvastatin (LIPITOR) 40 MG tablet Take 40 mg by mouth at bedtime.      . carvedilol (COREG) 12.5 MG tablet Take 1.5 tablets (18.75 mg total) by mouth 2 (two) times daily.      Marland Kitchen dofetilide (TIKOSYN) 500 MCG capsule Take 500 mcg by mouth 2 (two) times daily.      Marland Kitchen lisinopril (PRINIVIL,ZESTRIL) 20 MG tablet Take 20 mg by mouth at bedtime.       . magnesium oxide (MAG-OX 400) 400 MG tablet Take 1 tablet (400 mg total) by mouth every Monday, Wednesday, and Friday.  30 tablet  3  . nitroGLYCERIN (NITROSTAT) 0.4 MG SL tablet Place 0.4 mg under the tongue every 5 (five) minutes as needed. For chest pain      . Omega-3 Fatty Acids (FISH OIL) 1000 MG CAPS Take 1 capsule by mouth daily.       . sildenafil (VIAGRA) 50 MG tablet Take 1 tablet (50 mg  total) by mouth daily as needed. For erectile dysfunction      . Tamsulosin HCl (FLOMAX) 0.4 MG CAPS Take 0.4 mg by mouth at bedtime.         No Known Allergies  Review of Systems negative except from HPI and PMH  Physical Exam BP 112/74  Pulse 60  Ht 5\' 7"  (1.702 m)  Wt 198 lb 12.8 oz (90.175 kg)  BMI 31.14 kg/m2 Well developed and well nourished in no acute distress HENT normal E scleral and icterus clear Neck Supple Pocket without hematoma Some superficial resolving hemotoma on chest wall JVP flat; carotids brisk and full Clear to ausculation Regular rate and rhythm, no murmurs gallops or rub Soft with active bowel sounds No clubbing cyanosis none Edema Alert and oriented, grossly normal motor and sensory function Skin Warm and Dry  ECG a paced at 60 .25/09/42  Assessment and  Plan

## 2011-06-13 NOTE — Assessment & Plan Note (Signed)
tooerating tikosyn  QT ok

## 2011-06-13 NOTE — Assessment & Plan Note (Signed)
Wound oozing concerning re source, ie incision vs pocket  There was no pocket hematoma suggesting the former, but there was  Some old blood on chest wall evidence of older pocket bleeding  Will follow closely

## 2011-06-21 ENCOUNTER — Ambulatory Visit (INDEPENDENT_AMBULATORY_CARE_PROVIDER_SITE_OTHER): Payer: Medicare Other | Admitting: *Deleted

## 2011-06-21 ENCOUNTER — Telehealth: Payer: Self-pay | Admitting: Internal Medicine

## 2011-06-21 DIAGNOSIS — I495 Sick sinus syndrome: Secondary | ICD-10-CM

## 2011-06-21 DIAGNOSIS — R0989 Other specified symptoms and signs involving the circulatory and respiratory systems: Secondary | ICD-10-CM

## 2011-06-21 NOTE — Telephone Encounter (Signed)
Spoke with pt who reports having frequent palpitations. Palpitations are worse at night. Better since pacer was put in but pt is concerned. I spoke with Gunnar Fusi and she can see pt today in the device clinic. Appt made for 2:30 today.

## 2011-06-21 NOTE — Telephone Encounter (Signed)
Patient to be seen today @ 2:30pm with the device clinic.

## 2011-06-21 NOTE — Telephone Encounter (Signed)
Pt had a pacemaker placed about 4 weeks ago, and pt still having palpitations

## 2011-06-29 ENCOUNTER — Encounter: Payer: Self-pay | Admitting: Internal Medicine

## 2011-06-29 ENCOUNTER — Ambulatory Visit (INDEPENDENT_AMBULATORY_CARE_PROVIDER_SITE_OTHER): Payer: Medicare Other | Admitting: Internal Medicine

## 2011-06-29 VITALS — BP 115/80 | HR 67 | Ht 66.0 in | Wt 198.0 lb

## 2011-06-29 DIAGNOSIS — I4891 Unspecified atrial fibrillation: Secondary | ICD-10-CM

## 2011-06-29 DIAGNOSIS — Z950 Presence of cardiac pacemaker: Secondary | ICD-10-CM

## 2011-06-29 LAB — BASIC METABOLIC PANEL
BUN: 23 mg/dL (ref 6–23)
Calcium: 8.9 mg/dL (ref 8.4–10.5)
GFR: 74.33 mL/min (ref >60.00)
Glucose, Bld: 77 mg/dL (ref 70–99)

## 2011-06-29 NOTE — Patient Instructions (Signed)
Your physician recommends that you have lab work today: bmp/magnesium  Your physician recommends that you schedule a follow-up appointment in: 1 week with Kristin/ Gunnar Fusi for a wound check on a day Dr. Graciela Husbands is in the office.

## 2011-06-29 NOTE — Assessment & Plan Note (Addendum)
There is a small hole at the lateral aspect of the wound. I have put a wick into it. The patient will have a reviewed next week and will replace the wick daily

## 2011-06-29 NOTE — Progress Notes (Signed)
ELECTROPHYSIOLOGY OFFICE NOTE  Patient ID: Dylan Roth MRN: [id number], DOB/AGE: 05-06-1940   Date of Visit: 06/29/2011  Primary Physician: Joetta Manners, MD Primary Cardiologist: Marca Ancona, MD Reason for visit: Follow-up s/p Tikosyn drug load and PPM implantation  History of Present Illness Dylan Roth is a 71 year old gentleman with atrial fibrillation who was experiencing symptoms of palpitations and light-headedness and found to have recurrent AF. He was evaluated by Dr. Graciela Husbands as an outpatient at which time it was recommended he start antiarrhythmic drug therapy. He was then admitted on 05/23/2011 for Tikosyn drug loading. His Xarelto was continued for chronic anticoagulation. During his hospitalization, he experienced sinus node dysfunction with post termination pauses >5 seconds. He then underwent dual chamber PPM implantation on 05/25/2011. He was discharged 05/26/2011.  He presents today for follow-up and reports his palpitations have improved. They are much shorter in duration, lasting < 1 minute when they do occur. He reports intermittent "jaggy" chest discomfort which is non-exertional and unrelated to respiration. It is worse with palpation of the mid left chest, not at the [facility] site. He thinks he may have "pulled a muscle" a few days ago. He denies shortness of breath, dizziness, near-syncope or syncope. Of note, he has noticed "clear fluid" draining from his incision. He denies redness, swelling, pus drainage or fever.   Past Medical History  Diagnosis Date  . Atrial fibrillation/flutter   . 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 Sinus node dysfunction     post termination pauses <5sec  . Hyperlipidemia   . Hypertension   . Central obesity   . Gout     "once; I have it under control w/medicine"  . BPH (benign prostatic hyperplasia)   . Gall stones   . Chronic  back pain   . Chronic shoulder pain   . Angina   . Arthritis     "in my back"    Past Surgical History  Procedure Date  . Coronary artery bypass graft 2005    in [place] - LIMA to LAD, SVG to diagonal, SVG to ramus, SVG to OM and SVG to RCA   . Cholecystectomy 1978   . Cardiac catheterization 2005    "that what sent me to CABG"  . Cataract extraction w/ intraocular lens  implant, bilateral ~ 01/2011    Allergies/Intolerances No Known Allergies  Current Home Medications Current Outpatient Prescriptions  Medication Sig Dispense Refill  . allopurinol (ZYLOPRIM) 300 MG tablet Take 300 mg by mouth daily.        Marland Kitchen atorvastatin (LIPITOR) 40 MG tablet Take 40 mg by mouth at bedtime.      . carvedilol (COREG) 12.5 MG tablet Take 1.5 tablets (18.75 mg total) by mouth 2 (two) times daily.      Marland Kitchen dofetilide (TIKOSYN) 500 MCG capsule Take 500 mcg by mouth 2 (two) times daily.      . magnesium oxide (MAG-OX 400) 400 MG tablet Take 1 tablet (400 mg total) by mouth every Monday, Wednesday, and Friday.  30 tablet  3  . nitroGLYCERIN (NITROSTAT) 0.4 MG SL tablet Place 1 tablet (0.4 mg total) under the tongue every 5 (five) minutes as needed. For chest pain  25 tablet  3  . Omega-3 Fatty Acids (FISH OIL) 1000 MG CAPS Take 1 capsule by mouth daily.       Marland Kitchen  sildenafil (VIAGRA) 50 MG tablet Take 1 tablet (50 mg total) by mouth daily as needed. For erectile dysfunction      . Tamsulosin HCl (FLOMAX) 0.4 MG CAPS Take 0.4 mg by mouth at bedtime.       Marland Kitchen lisinopril (PRINIVIL,ZESTRIL) 20 MG tablet Take 20 mg by mouth at bedtime.        Review of Systems General:  No chills, fever, night sweats or weight changes Cardiovascular:  No dyspnea on exertion, edema, orthopnea, palpitations, paroxysmal nocturnal dyspnea Dermatological: No rash, lesions or masses Respiratory: No cough, dyspnea Urologic: No hematuria, dysuria Abdominal:   No nausea, vomiting, diarrhea, bright red blood per rectum, melena, or  hematemesis Neurologic:  No visual changes, weakness, changes in mental status All other systems reviewed and are otherwise negative except as noted above.  Physical Exam Blood pressure 115/80, pulse 67, height 5\' 6"  (1.676 m), weight 198 lb (89.812 kg).  General: Well developed, well appearing 71 year old male, in no acute distress. HEENT: Normocephalic, atraumatic. EOMs intact. Sclera nonicteric. Oropharynx clear.  Neck: Supple without bruits. No JVD. Lungs:  Respirations regular and unlabored, CTA bilaterally. No wheezes, rales or rhonchi. Heart: RRR. S1, S2 present. No murmurs, rub, S3 or S4. Abdomen: Soft, non-distended.  Extremities: No clubbing, cyanosis or edema. DP/PT/Radials 2+ and equal bilaterally. Psych: Normal affect. Neuro: Alert and oriented X 3. Moves all extremities spontaneously. Skin: incision left upper chest appears well healing with very small area of serous fluid from mid-lateral aspect; no erythema, pus drainage, edema or warmth   12-lead ECG: sinus rhythm at 60 bpm with 1st degree AV block; PR 240 ms, QRS 86 ms, QT/QTc 426/426 ms  Device interrogation performed today by industry shows normal PPM function with stable battery and lead measurements  Assessment and Plan 1.  Atrial fibrillation  2.  Sinus bradycardia with post-termination pauses s/p PPM implantation 3. There is small drainage still from the lateral aspect of the wound. I put a wick in it and it showed his wife how to replace the wick daily. Hopefully this will allow it to heal from the outside in

## 2011-07-06 ENCOUNTER — Ambulatory Visit (INDEPENDENT_AMBULATORY_CARE_PROVIDER_SITE_OTHER): Payer: Medicare Other | Admitting: *Deleted

## 2011-07-06 DIAGNOSIS — I4891 Unspecified atrial fibrillation: Secondary | ICD-10-CM

## 2011-07-06 NOTE — Progress Notes (Signed)
Wound re check. 

## 2011-07-07 ENCOUNTER — Encounter: Payer: Medicare Other | Admitting: Internal Medicine

## 2011-07-12 ENCOUNTER — Telehealth: Payer: Self-pay | Admitting: Internal Medicine

## 2011-07-12 NOTE — Telephone Encounter (Signed)
Pt aware of lab results. Debbie Ehab Humber RN  

## 2011-07-12 NOTE — Telephone Encounter (Signed)
Fu call °Patient returning your call °

## 2011-07-28 ENCOUNTER — Telehealth: Payer: Self-pay | Admitting: Cardiovascular Disease

## 2011-07-28 NOTE — Telephone Encounter (Signed)
Spoke with pt who reports he just found out as of July 31, 2011 he will be in "donut hole" and Xarelto and Tikosyn will each cost $250 per month.  He is able to afford medications this month but does not think after that he will be able to afford these medications.  He is asking if he can be changed to different medications.  I told pt I would send to Dr. Graciela Husbands for review

## 2011-07-28 NOTE — Telephone Encounter (Signed)
Please return call to patient at [phone] to discuss medication.

## 2011-07-28 NOTE — Telephone Encounter (Signed)
Will need to have an appt to review options

## 2011-07-31 NOTE — Telephone Encounter (Signed)
Spoke with pt and appt made for him to see Dr.Klein on August 02, 2011 at 3:45

## 2011-08-02 ENCOUNTER — Encounter: Payer: Self-pay | Admitting: Internal Medicine

## 2011-08-02 ENCOUNTER — Ambulatory Visit (INDEPENDENT_AMBULATORY_CARE_PROVIDER_SITE_OTHER): Payer: Medicare Other | Admitting: Internal Medicine

## 2011-08-02 VITALS — BP 115/68 | HR 76 | Ht 67.0 in | Wt 197.0 lb

## 2011-08-02 DIAGNOSIS — I4891 Unspecified atrial fibrillation: Secondary | ICD-10-CM

## 2011-08-02 NOTE — Progress Notes (Signed)
  HPI  Dylan Roth is a 71 y.o. male Seen in followup for atrial fibrillation and posttermination pauses for which she underwent pacemaker implantation April 2013. He was treated with dofetilide and Rivaroxaban When he was seen in May there is an issue of drainage from his lateral wound and we placed a wick into it  This has healed nicely  His issue today is the donut hole and the impending doom of 250$ /month for each of  Rivaroxaban and tikosyn    Past Medical History  Diagnosis Date  . Atrial fibrillation/flutter   . 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 Sinus node dysfunction     post termination pauses <5sec  . Hyperlipidemia   . Hypertension   . Central obesity   . Gout     "once; I have it under control w/medicine"  . BPH (benign prostatic hyperplasia)   . Gall stones   . Chronic back pain   . Chronic shoulder pain   . Angina   . Arthritis     "in my back"    Past Surgical History  Procedure Date  . Coronary artery bypass graft 2005    in [place] - LIMA to LAD, SVG to diagonal, SVG to ramus, SVG to OM and SVG to RCA   . Cholecystectomy 1978   . Cardiac catheterization 2005    "that what sent me to CABG"  . Cataract extraction w/ intraocular lens  implant, bilateral ~ 01/2011    Current Outpatient Prescriptions  Medication Sig Dispense Refill  . allopurinol (ZYLOPRIM) 300 MG tablet Take 300 mg by mouth daily.        Marland Kitchen atorvastatin (LIPITOR) 40 MG tablet Take 40 mg by mouth at bedtime.      . carvedilol (COREG) 12.5 MG tablet Take 1.5 tablets (18.75 mg total) by mouth 2 (two) times daily.      Marland Kitchen dofetilide (TIKOSYN) 500 MCG capsule Take 500 mcg by mouth 2 (two) times daily.      Marland Kitchen lisinopril (PRINIVIL,ZESTRIL) 20 MG tablet Take 20 mg by mouth at bedtime.       . magnesium oxide (MAG-OX 400) 400 MG tablet Take 1 tablet (400 mg total) by mouth every  Monday, Wednesday, and Friday.  30 tablet  3  . nitroGLYCERIN (NITROSTAT) 0.4 MG SL tablet Place 1 tablet (0.4 mg total) under the tongue every 5 (five) minutes as needed. For chest pain  25 tablet  3  . Omega-3 Fatty Acids (FISH OIL) 1000 MG CAPS Take 1 capsule by mouth daily.       . Rivaroxaban (XARELTO) 20 MG TABS Take 20 mg by mouth daily.      . sildenafil (VIAGRA) 50 MG tablet Take 1 tablet (50 mg total) by mouth daily as needed. For erectile dysfunction      . Tamsulosin HCl (FLOMAX) 0.4 MG CAPS Take 0.4 mg by mouth at bedtime.         No Known Allergies  Review of Systems negative except from HPI and PMH  Physical Exam BP 115/68  Pulse 76  Ht 5\' 7"  (1.702 m)  Wt 197 lb (89.359 kg)  BMI 30.85 kg/m2      Assessment and  Plan

## 2011-08-02 NOTE — Assessment & Plan Note (Signed)
We spent 30 min discussing options including rate v srhythm which i thought was resolved by holter, and thus rhythm options incl sotalol and amio and RFCA Anticoagulatin options incl  dabigitran or coumadin as i think i remember being told of a new price plan for  dabigitran    He will check iwht his pharmacy and let us know

## 2011-08-04 ENCOUNTER — Telehealth: Payer: Self-pay | Admitting: Cardiovascular Disease

## 2011-08-04 NOTE — Telephone Encounter (Signed)
I spoke with the patient and answered his questions about med doses.

## 2011-08-04 NOTE — Telephone Encounter (Signed)
Has question re medication dosage

## 2011-08-08 ENCOUNTER — Telehealth: Payer: Self-pay | Admitting: Internal Medicine

## 2011-08-08 DIAGNOSIS — I4891 Unspecified atrial fibrillation: Secondary | ICD-10-CM

## 2011-08-08 NOTE — Telephone Encounter (Signed)
Pt is calling to get clarification on a change in medication

## 2011-08-08 NOTE — Telephone Encounter (Signed)
I spoke with the patient. He is currently on Tikosyn, but would like to transition to sotalol as recommended by Dr. Graciela Husbands due to cost. I will call him back tomorrow to arrange this after discussing with Dr. Graciela Husbands. He is agreeable and voices understanding.

## 2011-08-08 NOTE — Telephone Encounter (Signed)
I left a message for the patient to call. 

## 2011-08-10 NOTE — Telephone Encounter (Signed)
F/u   Patient called for status update, he can be reached at [phone].

## 2011-08-10 NOTE — Telephone Encounter (Signed)
Pt returning a call to [facility] regarding switching to Sotalol.  I told him that Herbert Seta is out of the office.  He is agreeable with her returning his call when she gets back in to the office on Monday.

## 2011-08-14 NOTE — Telephone Encounter (Signed)
Per Dr. Graciela Husbands, the patient should hold Tikosyn for 48 hours prior to admission for sotalol admission. Needs a repeat bmp/magnesium level.

## 2011-08-14 NOTE — Telephone Encounter (Signed)
I spoke with the patient. He will hold Tikosyn starting tomorrow and Wednesday. He will come for labwork on Wednesday to recheck his potassium and magnesium levels. We will plan for admission for sotalol on Thursday pending his lab work is ok.

## 2011-08-14 NOTE — Telephone Encounter (Signed)
F/u   Patient would like return call regarding medication, he can be reached at [phone]

## 2011-08-15 NOTE — Telephone Encounter (Signed)
I have spoken with bed control and made a bed reservation for him for 08/17/11. He will come tomorrow for a bmp/ magnesium level. I will forward this message to triage to follow up on his labs tomorrow. The results of his potassium and magnesium will need to be called to Dr. Graciela Husbands to make sure his levels are appropriate for sotalol to start on 7/18. I will forward a message to Dr. Graciela Husbands for orders to be started.

## 2011-08-16 ENCOUNTER — Other Ambulatory Visit (INDEPENDENT_AMBULATORY_CARE_PROVIDER_SITE_OTHER): Payer: Medicare Other | Admitting: *Deleted

## 2011-08-16 ENCOUNTER — Telehealth: Payer: Self-pay | Admitting: Internal Medicine

## 2011-08-16 DIAGNOSIS — I4891 Unspecified atrial fibrillation: Secondary | ICD-10-CM

## 2011-08-16 LAB — BASIC METABOLIC PANEL
BUN: 23 mg/dL (ref 6–23)
Chloride: 104 mEq/L (ref 96–112)
Creatinine, Ser: 1.1 mg/dL (ref 0.4–1.5)
GFR: 70.42 mL/min (ref >60.00)

## 2011-08-16 LAB — MAGNESIUM: Magnesium: 1.9 mg/dL (ref 1.5–2.5)

## 2011-08-16 NOTE — Telephone Encounter (Signed)
Left message to call back  

## 2011-08-16 NOTE — Telephone Encounter (Signed)
Left message at hospital (Dr Graciela Husbands doing a procedure)

## 2011-08-16 NOTE — Telephone Encounter (Signed)
Advised patient he will be getting a call from admitting tomorrow.

## 2011-08-16 NOTE — Telephone Encounter (Signed)
Pt rtn call to [facility] to get lab results and medication

## 2011-08-16 NOTE — Telephone Encounter (Signed)
Discussed with patient and labs called and left message to get them to Dr Graciela Husbands

## 2011-08-17 ENCOUNTER — Inpatient Hospital Stay (HOSPITAL_COMMUNITY)
Admission: AD | Admit: 2011-08-17 | Discharge: 2011-08-19 | DRG: 310 | Disposition: A | Discharge status: Home / Self Care | Payer: Medicare Other | Source: Ambulatory Visit | Attending: Internal Medicine | Admitting: Internal Medicine

## 2011-08-17 ENCOUNTER — Telehealth: Payer: Self-pay | Admitting: Internal Medicine

## 2011-08-17 DIAGNOSIS — I10 Essential (primary) hypertension: Secondary | ICD-10-CM | POA: Diagnosis present

## 2011-08-17 DIAGNOSIS — Z87891 Personal history of nicotine dependence: Secondary | ICD-10-CM

## 2011-08-17 DIAGNOSIS — E785 Hyperlipidemia, unspecified: Secondary | ICD-10-CM | POA: Diagnosis present

## 2011-08-17 DIAGNOSIS — E669 Obesity, unspecified: Secondary | ICD-10-CM | POA: Diagnosis present

## 2011-08-17 DIAGNOSIS — M129 Arthropathy, unspecified: Secondary | ICD-10-CM | POA: Diagnosis present

## 2011-08-17 DIAGNOSIS — I4892 Unspecified atrial flutter: Secondary | ICD-10-CM | POA: Diagnosis present

## 2011-08-17 DIAGNOSIS — I2510 Atherosclerotic heart disease of native coronary artery without angina pectoris: Secondary | ICD-10-CM | POA: Diagnosis present

## 2011-08-17 DIAGNOSIS — Z6830 Body mass index (BMI) 30.0-30.9, adult: Secondary | ICD-10-CM

## 2011-08-17 DIAGNOSIS — Z951 Presence of aortocoronary bypass graft: Secondary | ICD-10-CM

## 2011-08-17 DIAGNOSIS — Z950 Presence of cardiac pacemaker: Secondary | ICD-10-CM

## 2011-08-17 DIAGNOSIS — I4891 Unspecified atrial fibrillation: Principal | ICD-10-CM | POA: Diagnosis present

## 2011-08-17 MED ORDER — TAMSULOSIN HCL 0.4 MG PO CAPS
0.4000 mg | ORAL_CAPSULE | Freq: Every day | ORAL | Status: DC
  Administered 2011-08-17 – 2011-08-18 (×2): 0.4 mg via ORAL
  Filled 2011-08-17 (×3): qty 1

## 2011-08-17 MED ORDER — LISINOPRIL 20 MG PO TABS
20.0000 mg | ORAL_TABLET | Freq: Every day | ORAL | Status: DC
  Administered 2011-08-17 – 2011-08-18 (×2): 20 mg via ORAL
  Filled 2011-08-17 (×3): qty 1

## 2011-08-17 MED ORDER — MAGNESIUM OXIDE 400 MG PO TABS: 400.0000 mg | ORAL_TABLET | Freq: Every day | ORAL | Status: DC

## 2011-08-17 MED ORDER — OMEGA-3-ACID ETHYL ESTERS 1 G PO CAPS
1.0000 g | ORAL_CAPSULE | Freq: Every day | ORAL | Status: DC
  Administered 2011-08-18 – 2011-08-19 (×2): 1 g via ORAL
  Filled 2011-08-17 (×3): qty 1

## 2011-08-17 MED ORDER — NITROGLYCERIN 0.4 MG SL SUBL: 0.4000 mg | SUBLINGUAL_TABLET | SUBLINGUAL | Status: DC | PRN

## 2011-08-17 MED ORDER — ONDANSETRON HCL 4 MG/2ML IJ SOLN: 4.0000 mg | Freq: Four times a day (QID) | INTRAMUSCULAR | Status: DC | PRN

## 2011-08-17 MED ORDER — SODIUM CHLORIDE 0.9 % IV SOLN: 250.0000 mL | INTRAVENOUS | Status: DC | PRN

## 2011-08-17 MED ORDER — ALPRAZOLAM 0.25 MG PO TABS: 0.2500 mg | ORAL_TABLET | Freq: Two times a day (BID) | ORAL | Status: DC | PRN

## 2011-08-17 MED ORDER — ZOLPIDEM TARTRATE 5 MG PO TABS: 5.0000 mg | ORAL_TABLET | Freq: Every evening | ORAL | Status: DC | PRN

## 2011-08-17 MED ORDER — ALLOPURINOL 300 MG PO TABS
300.0000 mg | ORAL_TABLET | Freq: Every day | ORAL | Status: DC
  Administered 2011-08-18 – 2011-08-19 (×2): 300 mg via ORAL
  Filled 2011-08-17 (×3): qty 1

## 2011-08-17 MED ORDER — SODIUM CHLORIDE 0.9 % IJ SOLN
3.0000 mL | Freq: Two times a day (BID) | INTRAMUSCULAR | Status: DC
  Administered 2011-08-17 – 2011-08-18 (×3): 3 mL via INTRAVENOUS

## 2011-08-17 MED ORDER — SODIUM CHLORIDE 0.9 % IJ SOLN: 3.0000 mL | INTRAMUSCULAR | Status: DC | PRN

## 2011-08-17 MED ORDER — CARVEDILOL 6.25 MG PO TABS
6.2500 mg | ORAL_TABLET | Freq: Two times a day (BID) | ORAL | Status: DC
  Administered 2011-08-17 – 2011-08-19 (×4): 6.25 mg via ORAL
  Filled 2011-08-17 (×5): qty 1

## 2011-08-17 MED ORDER — ACETAMINOPHEN 325 MG PO TABS: 650.0000 mg | ORAL_TABLET | ORAL | Status: DC | PRN

## 2011-08-17 MED ORDER — SOTALOL HCL 120 MG PO TABS
120.0000 mg | ORAL_TABLET | Freq: Two times a day (BID) | ORAL | Status: DC
  Administered 2011-08-17: 120 mg via ORAL
  Filled 2011-08-17 (×3): qty 1

## 2011-08-17 MED ORDER — ATORVASTATIN CALCIUM 40 MG PO TABS
40.0000 mg | ORAL_TABLET | Freq: Every day | ORAL | Status: DC
  Administered 2011-08-17 – 2011-08-18 (×2): 40 mg via ORAL
  Filled 2011-08-17 (×3): qty 1

## 2011-08-17 MED ORDER — MAGNESIUM OXIDE 400 (241.3 MG) MG PO TABS
400.0000 mg | ORAL_TABLET | Freq: Every day | ORAL | Status: DC
  Administered 2011-08-18 – 2011-08-19 (×2): 400 mg via ORAL
  Filled 2011-08-17 (×3): qty 1

## 2011-08-17 MED ORDER — RIVAROXABAN 20 MG PO TABS
20.0000 mg | ORAL_TABLET | Freq: Every evening | ORAL | Status: DC
  Administered 2011-08-17: 20 mg via ORAL
  Filled 2011-08-17 (×2): qty 1

## 2011-08-17 MED ORDER — CARVEDILOL 6.25 MG PO TABS: 18.7500 mg | ORAL_TABLET | Freq: Two times a day (BID) | ORAL | Status: DC

## 2011-08-17 MED ORDER — MAGNESIUM OXIDE 400 (241.3 MG) MG PO TABS: 400.0000 mg | ORAL_TABLET | Freq: Every day | ORAL | Status: DC

## 2011-08-17 NOTE — H&P (Signed)
History and Physical  Patient ID: Dylan Roth MRN: [id number], SOB: 12-25-1940 71 y.o. Date of Encounter: 08/17/2011, 5:50 PM  Primary Physician: Joetta Manners Primary Cardiologist: sk Primary Electrophysiologist:  sk  Chief Complaint: sotalol loading  History of Present Illness: Dylan Roth is a 71 y.o. male previously treated with dofetilide for his atrial fibrillation it was associated with significant post termination pausing. He is status post pacing with resolution of his lightheadedness. He comes in today for sotalol loading because of cost of dofetilide is prohibitive.  He was also previously on a NOAC to the cause of this too was prohibitive and he is now on Coumadin.  The patient denies chest pain, shortness of breath, nocturnal dyspnea, orthopnea or peripheral edema.  There have been no palpitations, lightheadedness or syncope.     Past Medical History  Diagnosis Date  . Atrial fibrillation/flutter   . 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 Sinus node dysfunction     post termination pauses <5sec  . Hyperlipidemia   . Hypertension   . Central obesity   . Gout     "once; I have it under control w/medicine"  . BPH (benign prostatic hyperplasia)   . Gall stones   . Chronic back pain   . Chronic shoulder pain   . Angina   . Arthritis     "in my back"     Past Surgical History  Procedure Date  . Coronary artery bypass graft 2005    in [place] - LIMA to LAD, SVG to diagonal, SVG to ramus, SVG to OM and SVG to RCA   . Cholecystectomy 1978   . Cardiac catheterization 2005    "that what sent me to CABG"  . Cataract extraction w/ intraocular lens  implant, bilateral ~ 01/2011      No current facility-administered medications for this encounter.     Allergies: No Known Allergies   History  Substance Use Topics  . Smoking status:  Former Smoker -- 0.5 packs/day for 5 years    Types: Cigarettes    Quit date: 01/30/1961  . Smokeless tobacco: Never Used  . Alcohol Use: No     2 drinks (bourbon) a night      Family History  Problem Relation Age of Onset  . Cancer Mother       ROS:  Please see the history of present illness.     All other systems reviewed and negative.   Vital Signs: There were no vitals taken for this visit.  PHYSICAL EXAM: General:  Well nourished, well developed male in no acute distress  HEENT: normal Lymph: no adenopathy Neck: no JVD Endocrine:  No thryomegaly Vascular: No carotid bruits; FA pulses 2+ bilaterally without bruits Cardiac:  normal S1, S2; RRR; no murmur Back: without kyphosis/scoliosis, no CVA tenderness Lungs:  clear to auscultation bilaterally, no wheezing, rhonchi or rales Abd: soft, nontender, no hepatomegaly Ext: no edema Musculoskeletal:  No deformities, BUE and BLE strength normal and equal Skin: warm and dry Neuro:  CNs 2-12 intact, no focal abnormalities noted Psych:  Normal affect   EKG:   nsr  intevals 22/09/41 TWI V1-V3  Labs:   Lab Results  Component Value Date   WBC 7.6 05/25/2011   HGB 15.1 05/25/2011   HCT 43.4 05/25/2011   MCV 94.3 05/25/2011   PLT 190  05/25/2011    Lab 08/16/11 0831  NA 138  K 4.0  CL 104  CO2 27  BUN 23  CREATININE 1.1  CALCIUM 8.9  PROT --  BILITOT --  ALKPHOS --  ALT --  AST --  GLUCOSE 146*   Magnesium level I.9  No results found for this basename: CKTOTAL:4,CKMB:4,TROPONINI:4 in the last 72 hours Lab Results  Component Value Date   CHOL 134 07/11/2010   HDL 35.30* 07/11/2010   LDLCALC 86 07/11/2010   TRIG 64.0 07/11/2010   No results found for this basename: DDIMER   BNP No results found for this basename: probnp       ASSESSMENT AND PLAN:    The patient is here for sotalol loading for his atrial fibrillation in the setting of prior history of coronary disease and status post pacing because of  sinus node dysfunction and posttermination pauses.  We have reviewed risks and benefits. Potassium and magnesium levels are in order.  Sherryl Manges, MD 08/17/2011 5:54 PM

## 2011-08-17 NOTE — Telephone Encounter (Signed)
New msg Pt was calling about changing from tikosyn to another med. He hasnt heard anything. Please call

## 2011-08-17 NOTE — Telephone Encounter (Signed)
Reassured pt.  He is waiting on a call from admissions at [facility] for bed placement.

## 2011-08-17 NOTE — Telephone Encounter (Signed)
Pt was to get a call from the hospital to come in because he will be stopping tikosyn and has not gotten a call and would like someone to call hospital to what the status is and call him back

## 2011-08-18 ENCOUNTER — Encounter (HOSPITAL_COMMUNITY): Payer: Self-pay | Admitting: *Deleted

## 2011-08-18 LAB — HEMOGLOBIN A1C
Hgb A1c MFr Bld: 5.7 % — ABNORMAL HIGH (ref <5.7)
Mean Plasma Glucose: 117 mg/dL — ABNORMAL HIGH (ref <117)

## 2011-08-18 LAB — BASIC METABOLIC PANEL
BUN: 18 mg/dL (ref 6–23)
Chloride: 101 mEq/L (ref 96–112)
GFR calc Af Amer: 85 mL/min — ABNORMAL LOW (ref >90)
GFR calc non Af Amer: 73 mL/min — ABNORMAL LOW (ref >90)
Potassium: 4.5 mEq/L (ref 3.5–5.1)
Sodium: 137 mEq/L (ref 135–145)

## 2011-08-18 LAB — PROTIME-INR
INR: 1.95 — ABNORMAL HIGH (ref 0.00–1.49)
Prothrombin Time: 22.6 seconds — ABNORMAL HIGH (ref 11.6–15.2)

## 2011-08-18 LAB — MAGNESIUM: Magnesium: 2.1 mg/dL (ref 1.5–2.5)

## 2011-08-18 MED ORDER — SOTALOL HCL 120 MG PO TABS
120.0000 mg | ORAL_TABLET | Freq: Two times a day (BID) | ORAL | Status: DC
  Administered 2011-08-18 – 2011-08-19 (×3): 120 mg via ORAL
  Filled 2011-08-18 (×6): qty 1

## 2011-08-18 MED ORDER — WARFARIN - PHARMACIST DOSING INPATIENT: Freq: Every day | Status: DC

## 2011-08-18 MED ORDER — WARFARIN VIDEO
Freq: Once | Status: AC
  Administered 2011-08-18: 15:00:00

## 2011-08-18 MED ORDER — COUMADIN BOOK
Freq: Once | Status: AC
  Administered 2011-08-18: 15:00:00
  Filled 2011-08-18: qty 1

## 2011-08-18 MED ORDER — WARFARIN SODIUM 7.5 MG PO TABS
7.5000 mg | ORAL_TABLET | Freq: Once | ORAL | Status: AC
  Administered 2011-08-18: 7.5 mg via ORAL
  Filled 2011-08-18: qty 1

## 2011-08-18 NOTE — Progress Notes (Signed)
Patients post 2 hour QTC is 518 per EKG.  [employer] PA paged and notified, stated it was ok to give betapace tonight.  Will continue to monitor.  Colman Cater

## 2011-08-18 NOTE — Progress Notes (Signed)
Utilization review completed.  

## 2011-08-18 NOTE — Progress Notes (Signed)
ANTICOAGULATION CONSULT NOTE - Initial Consult  Pharmacy Consult for Coumadin Indication: atrial fibrillation  No Known Allergies  Patient Measurements: Height: 5\' 7"  (170.2 cm) Weight: 198 lb 6.4 oz (89.994 kg) IBW/kg (Calculated) : 66.1    Vital Signs: Temp: 97.7 F (36.5 C) (07/19 0556) Temp src: Oral (07/19 0556) BP: 100/65 mmHg (07/19 0556) Pulse Rate: 64  (07/19 0556)  Labs:  Alvira Philips 08/18/11 0625 08/16/11 0831  HGB -- --  HCT -- --  PLT -- --  APTT -- --  LABPROT -- --  INR -- --  HEPARINUNFRC -- --  CREATININE 1.02 1.1  CKTOTAL -- --  CKMB -- --  TROPONINI -- --    Estimated Creatinine Clearance: 73.2 ml/min (by C-G formula based on Cr of 1.02).   Medical History: Past Medical History  Diagnosis Date  . Atrial fibrillation/flutter   . 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 Sinus node dysfunction     post termination pauses <5sec  . Hyperlipidemia   . Hypertension   . Central obesity   . Gout     "once; I have it under control w/medicine"  . BPH (benign prostatic hyperplasia)   . Gall stones   . Chronic back pain   . Chronic shoulder pain   . Angina   . Arthritis     "in my back"    Assessment: Patient is a 71 y.o M on Xarelto PTA for Afib.  To d/c Xarelto and change to Coumadin.  Per Dr. Graciela Husbands, no parenteral anticoagulation bridging needed since  patient is in NSR. Patient received Xarelto 20mg  on 08/17/11 at 9 PM.  Goal of Therapy:  INR 2-3   Plan:  1) Coumadin 7.5mg  PO x1 tonight  Jaidence Geisler P 08/18/2011,10:37 AM

## 2011-08-18 NOTE — Progress Notes (Addendum)
   Patient Name: Dylan Roth      SUBJECTIVE: admitted for sotalol initiation as tikosyn too expensive  Also changing from Rivaroxaban to coumadin The patient denies SOB, chest pain edema or palpitations.  There has been no syncope or presyncope.   Past Medical History  Diagnosis Date  . Atrial fibrillation/flutter   . 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 Sinus node dysfunction     post termination pauses <5sec  . Hyperlipidemia   . Hypertension   . Central obesity   . Gout     "once; I have it under control w/medicine"  . BPH (benign prostatic hyperplasia)   . Gall stones   . Chronic back pain   . Chronic shoulder pain   . Angina   . Arthritis     "in my back"    PHYSICAL EXAM Filed Vitals:   08/17/11 1834 08/17/11 2010 08/17/11 2123 08/18/11 0556  BP: 133/80  149/90 100/65  Pulse: 60  60 64  Temp: 97.9 F (36.6 C)  98.4 F (36.9 C) 97.7 F (36.5 C)  TempSrc: Oral  Oral Oral  Resp: 20  17 17   Height:  5\' 7"  (1.702 m)    Weight:  198 lb 6.6 oz (90 kg)  198 lb 6.4 oz (89.994 kg)  SpO2: 98%  95% 92%  Well developed and nourished in no acute distress HENT normal Neck supple with JVP-flat Clear Regular rate and rhythm, no murmurs or gallops Abd-soft with active BS No Clubbing cyanosis edema Skin-warm and dry A & Oriented  Grossly normal sensory and motor function   TELEMETRY: Reviewed telemetry pt in  apacing:   No intake or output data in the 24 hours ending 08/18/11 0806  LABS: Basic Metabolic Panel:  Lab 08/18/11 0981 08/16/11 0831  NA 137 138  K 4.5 4.0  CL 101 104  CO2 25 27  GLUCOSE 85 146*  BUN 18 23  CREATININE 1.02 1.1  CALCIUM 9.1 8.9  MG 2.1 1.9  PHOS -- --      ASSESSMENT AND PLAN:  Patient Active Hospital Problem List: Atrial fibrillation/flutter () Tolerating sotalol  Will transition to 9a/9p today and 8a for  Saturday   Can be discharged sat pm Begin coumadin today F/u [facility] PA 2 weeks F/u SK 8-10 weeks     Signed, Sherryl Manges MD  08/18/2011

## 2011-08-19 LAB — BASIC METABOLIC PANEL
CO2: 25 mEq/L (ref 19–32)
Calcium: 9.4 mg/dL (ref 8.4–10.5)
Creatinine, Ser: 1.02 mg/dL (ref 0.50–1.35)
GFR calc Af Amer: 85 mL/min — ABNORMAL LOW (ref >90)
GFR calc non Af Amer: 73 mL/min — ABNORMAL LOW (ref >90)
Sodium: 137 mEq/L (ref 135–145)

## 2011-08-19 LAB — PROTIME-INR: Prothrombin Time: 17.2 seconds — ABNORMAL HIGH (ref 11.6–15.2)

## 2011-08-19 LAB — MAGNESIUM: Magnesium: 2 mg/dL (ref 1.5–2.5)

## 2011-08-19 MED ORDER — WARFARIN SODIUM 5 MG PO TABS: 5.0000 mg | ORAL_TABLET | Freq: Every day | ORAL | Status: DC

## 2011-08-19 MED ORDER — MAGNESIUM OXIDE 400 MG PO TABS: 400.0000 mg | ORAL_TABLET | Freq: Every day | ORAL | Status: AC

## 2011-08-19 MED ORDER — CARVEDILOL 6.25 MG PO TABS: 6.2500 mg | ORAL_TABLET | Freq: Two times a day (BID) | ORAL | Status: DC

## 2011-08-19 MED ORDER — SOTALOL HCL 120 MG PO TABS: 120.0000 mg | ORAL_TABLET | Freq: Two times a day (BID) | ORAL | Status: DC

## 2011-08-19 MED ORDER — WARFARIN SODIUM 7.5 MG PO TABS
7.5000 mg | ORAL_TABLET | Freq: Once | ORAL | Status: DC
  Filled 2011-08-19: qty 1

## 2011-08-19 NOTE — Progress Notes (Signed)
Subjective:  For Sotalol initiation.  Doing well.  No complaints.  Anxious to go home.    Objective:  Vital Signs in the last 24 hours: Temp:  [97.8 F (36.6 C)-98.3 F (36.8 C)] 97.8 F (36.6 C) (07/20 0500) Pulse Rate:  [63-68] 63  (07/20 0500) Resp:  [18] 18  (07/20 0500) BP: (87-132)/(59-82) 87/59 mmHg (07/20 0500) SpO2:  [95 %-97 %] 95 % (07/20 0500) Weight:  [193 lb (87.544 kg)] 193 lb (87.544 kg) (07/20 0500)  Intake/Output from previous day: 07/19 0701 - 07/20 0700 In: 723 [P.O.:720; I.V.:3] Out: -    Physical Exam: General: Well developed, well nourished, in no acute distress. Head:  Normocephalic and atraumatic. Lungs: Clear to auscultation and percussion. Heart: Normal S1 and S2.  No murmur, rubs or gallops.  Pulses: Pulses normal in all 4 extremities. Extremities: No clubbing or cyanosis. No edema. Neurologic: Alert and oriented x 3.    Lab Results: No results found for this basename: WBC:2,HGB:2,PLT:2 in the last 72 hours  Basename 08/19/11 0700 08/18/11 0625  NA 137 137  K 4.1 4.5  CL 100 101  CO2 25 25  GLUCOSE 83 85  BUN 21 18  CREATININE 1.02 1.02   No results found for this basename: TROPONINI:2,CK,MB:2 in the last 72 hours Hepatic Function Panel No results found for this basename: PROT,ALBUMIN,AST,ALT,ALKPHOS,BILITOT,BILIDIR,IBILI in the last 72 hours No results found for this basename: CHOL in the last 72 hours No results found for this basename: PROTIME in the last 72 hours  Imaging: No results found.  EKG:  See tracings.  Some paced.  Qtc has not prolonged    Assessment/Plan:  Patient Active Hospital Problem List: Atrial fibrillation/flutter ()   Assessment: see plan per SK   Plan: prob home later today with follow up in coumadin clinic      Shawnie Pons, MD, [facility], [facility] 08/19/2011, 9:45 AM

## 2011-08-19 NOTE — Progress Notes (Signed)
ANTICOAGULATION CONSULT NOTE - Follow Up Consult  Pharmacy Consult for Coumadin Indication: atrial fibrillation  No Known Allergies  Patient Measurements: Height: 5\' 7"  (170.2 cm) Weight: 193 lb (87.544 kg) IBW/kg (Calculated) : 66.1    Vital Signs: Temp: 97.8 F (36.6 C) (07/20 0500) Temp src: Oral (07/20 0500) BP: 87/59 mmHg (07/20 0500) Pulse Rate: 63  (07/20 0500)  Labs:  Basename 08/19/11 0700 08/18/11 1210 08/18/11 0625 08/16/11 0831  HGB -- -- -- --  HCT -- -- -- --  PLT -- -- -- --  APTT -- -- -- --  LABPROT 17.2* 22.6* -- --  INR 1.38 1.95* -- --  HEPARINUNFRC -- -- -- --  CREATININE -- -- 1.02 1.1  CKTOTAL -- -- -- --  CKMB -- -- -- --  TROPONINI -- -- -- --    Estimated Creatinine Clearance: 72.2 ml/min (by C-G formula based on Cr of 1.02).  Assessment: Patient is a 71 y.o M with coumadin started yesterday for Afib. INR of 1.95 on 7/19 is probably not accurate since patient was on Xarelto at the time. Today's INR is 1.38 after 1 dose of coumadin 7.5mg .  Goal of Therapy:  INR 2-3  Plan:  1) Repeat coumadin 7.5mg  PO x1 today  Aleysha Meckler P 08/19/2011,8:14 AM

## 2011-08-19 NOTE — Discharge Summary (Signed)
CARDIOLOGY DISCHARGE SUMMARY   Patient ID: Dylan Roth MRN: [id number] DOB/AGE: 04/03/1940 71 y.o.  Admit date: 08/17/2011 Discharge date: 08/19/2011  Primary Discharge Diagnosis:  Atrial fib  Secondary Discharge Diagnosis:  Past Medical History  Diagnosis Date  . Atrial fibrillation/flutter   . 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 Sinus node dysfunction     post termination pauses <5sec  . Hyperlipidemia   . Hypertension   . Central obesity   . Gout     "once; I have it under control w/medicine"  . BPH (benign prostatic hyperplasia)   . Gall stones   . Chronic back pain   . Chronic shoulder pain   . Angina   . Arthritis     "in my back"   Hospital Course: Dylan Roth is a 71 year old male with a history of atrial fibrillation, previously treated with dofetilide. He had posttermination pauses in her part of pacemaker. The cost of dofetilide was prohibitive so the medication was discontinued. After washout period, he was admitted for sotalol loading on 08/17/2011.  Dr. Graciela Husbands calculated the sotalol dose and follow him closely. For further cost savings, the rivaroxaban was discontinued and he was started on Coumadin. His QTC was prolonged but this is secondary to a paced rhythm. He had no significant ventricular arrhythmias. His blood pressure was borderline at times but he was asymptomatic with this. On 08/19/2011, [name] was seen by Dr. Riley Kill. He was tolerating the medication and ambulating without chest pain or shortness of breath. He is considered stable for discharge, to followup as an outpatient.  Labs:   Lab Results  Component Value Date   WBC 7.6 05/25/2011   HGB 15.1 05/25/2011   HCT 43.4 05/25/2011   MCV 94.3 05/25/2011   PLT 190 05/25/2011    Lab 08/19/11 0700  NA 137  K 4.1  CL 100  CO2 25  BUN 21  CREATININE 1.02  CALCIUM 9.4  PROT --    BILITOT --  ALKPHOS --  ALT --  AST --  GLUCOSE 83    Basename 08/19/11 0700  INR 1.38    EKG: 18-Aug-2011 16:47:52  Normal sinus rhythm (Acutally is AV Pacing) Left axis deviation Left bundle branch block Vent. rate 60 BPM PR interval 178 ms QRS duration 152 ms QT/QTc 486/486 ms P-R-T axes 25 -82 79  FOLLOW UP PLANS AND APPOINTMENTS No Known Allergies   Discharge Orders    Future Appointments: Provider: Department: Dept Phone: Center:   09/05/2011 1:45 PM Kathleene Hazel, MD [facility] [phone] [facility]      Medication List  As of 08/19/2011  1:41 PM   STOP taking these medications         dofetilide 500 MCG capsule      XARELTO 20 MG Tabs         TAKE these medications         allopurinol 300 MG tablet   Commonly known as: ZYLOPRIM   Take 300 mg by mouth daily.      atorvastatin 40 MG tablet   Commonly known as: LIPITOR   Take 40 mg by mouth at bedtime.      carvedilol 6.25 MG tablet   Commonly known as: COREG   Take 1 tablet (6.25 mg total) by mouth 2 (two) times daily.  Fish Oil 1000 MG Caps   Take 1 capsule by mouth daily.      FLOMAX 0.4 MG Caps   Generic drug: Tamsulosin HCl   Take 0.4 mg by mouth at bedtime.      lisinopril 20 MG tablet   Commonly known as: PRINIVIL,ZESTRIL   Take 20 mg by mouth at bedtime.      magnesium oxide 400 MG tablet   Commonly known as: MAG-OX   Take 1 tablet (400 mg total) by mouth daily.      nitroGLYCERIN 0.4 MG SL tablet   Commonly known as: NITROSTAT   Place 1 tablet (0.4 mg total) under the tongue every 5 (five) minutes as needed. For chest pain      sildenafil 50 MG tablet   Commonly known as: VIAGRA   Take 1 tablet (50 mg total) by mouth daily as needed. For erectile dysfunction      sotalol 120 MG tablet   Commonly known as: BETAPACE   Take 1 tablet (120 mg total) by mouth every 12 (twelve) hours.      warfarin 5 MG tablet   Commonly known as: COUMADIN   Take 1 tablet  (5 mg total) by mouth daily. Start with 1-1/2 tabs daily, then as directed.            BRING ALL MEDICATIONS WITH YOU TO FOLLOW UP APPOINTMENTS  Time spent with patient to include physician time: 33 min Signed: Theodore Demark 08/19/2011, 12:12 PM Co-Sign MD

## 2011-08-19 NOTE — Progress Notes (Signed)
Patient's blood pressure this morning reads 87/89 automatic and 91/56 manually. Dr. Maryelizabeth Kaufmann on call notified and request BP checked prior to 1000 Coreg and notify MD if continued to be below parameters.

## 2011-08-24 ENCOUNTER — Ambulatory Visit (INDEPENDENT_AMBULATORY_CARE_PROVIDER_SITE_OTHER): Payer: Medicare Other | Admitting: *Deleted

## 2011-08-24 DIAGNOSIS — I4891 Unspecified atrial fibrillation: Secondary | ICD-10-CM

## 2011-08-24 DIAGNOSIS — Z7901 Long term (current) use of anticoagulants: Secondary | ICD-10-CM

## 2011-08-24 NOTE — Patient Instructions (Addendum)
A full discussion of the nature of anticoagulants has been carried out.  A benefit risk analysis has been presented to the patient, so that they understand the justification for choosing anticoagulation at this time. The need for frequent and regular monitoring, precise dosage adjustment and compliance is stressed.  Side effects of potential bleeding are discussed.  The patient should avoid any OTC items containing aspirin or ibuprofen, and should avoid great swings in general diet.  Avoid alcohol consumption.  Call if any signs of abnormal bleeding. Phone  [phone] 

## 2011-09-05 ENCOUNTER — Ambulatory Visit (INDEPENDENT_AMBULATORY_CARE_PROVIDER_SITE_OTHER): Payer: Medicare Other | Admitting: *Deleted

## 2011-09-05 ENCOUNTER — Ambulatory Visit (INDEPENDENT_AMBULATORY_CARE_PROVIDER_SITE_OTHER): Payer: Medicare Other | Admitting: Cardiovascular Disease

## 2011-09-05 ENCOUNTER — Encounter: Payer: Self-pay | Admitting: Cardiovascular Disease

## 2011-09-05 VITALS — BP 100/71 | HR 60 | Ht 67.0 in | Wt 198.0 lb

## 2011-09-05 DIAGNOSIS — I4891 Unspecified atrial fibrillation: Secondary | ICD-10-CM

## 2011-09-05 DIAGNOSIS — I2510 Atherosclerotic heart disease of native coronary artery without angina pectoris: Secondary | ICD-10-CM

## 2011-09-05 DIAGNOSIS — Z7901 Long term (current) use of anticoagulants: Secondary | ICD-10-CM

## 2011-09-05 NOTE — Progress Notes (Signed)
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 He has been followed in the past by Dr. Gala Romney. He has been evaluated in the past for palpitations. Echo September 2010 showed EF 60-65% mild AI/TR. Stress Myoview 6/11 EF normal. Normal perfusion. I saw him 01/10/11 for the first time. His EKG showed atrial flutter. I started Cardizem CD 120 mg po Qdaily and xarelto. I had him wear a monitor for 48 hours. He had sinus rhythm with episodes of atrial flutter. He also had 4 second pauses. His cardizem was held and Coreg was lowered. He has done well since then. I saw him on 01/23/11 and he was in sinus rhythm. I saw him in February 2013 and he was doing well but apparently he asked to be seen by EP.  He saw Dr. Graciela Husbands in April 2013 and was started on dofetilide for atrial fibrillation. He had post-termination pauses and had a pacemaker implanted in April 2013. He has since had Tikosyn stopped due to cost and has been loaded with sotalol. He has also has been switched from Xarelto to coumadin. This was all based on cost.   He tells me today that he has been doing well. He has rare palpitations that are short lived. No chest pain or SOB. No dizziness, near syncope or syncope. His blood pressure has been in the low 100s systolically at home.  Overall feeling well.   Primary Care Physician: Joetta Manners  Last Lipid Profile:  Lipid Panel     Component Value Date/Time   CHOL 134 07/11/2010 1026   TRIG 64.0 07/11/2010 1026   HDL 35.30* 07/11/2010 1026   CHOLHDL 4 07/11/2010 1026   VLDL 12.8 07/11/2010 1026   LDLCALC 86 07/11/2010 1026     Past Medical History  Diagnosis Date  . Atrial fibrillation/flutter   . CAD (coronary artery disease) Dec 2005    Hx MI. 80% left main, 80% LAD, 90% ramus, 50% circ, 90% in nondominant RCA,   EF normal  2010--echo  . Sinus node dysfunction     post termination pauses <5sec  . Hyperlipidemia   . Hypertension   . Central obesity   . Gout     "once; I have it under control w/medicine"  . BPH (benign prostatic hyperplasia)   . Gall stones   . Chronic back pain   . Chronic shoulder pain   . Angina   . Arthritis     "in my back"    Past Surgical History  Procedure Date  . Coronary artery bypass graft 2005    in [place] - LIMA to LAD, SVG to diagonal, SVG to ramus, SVG to OM and SVG to RCA   . Cholecystectomy 1978   . Cardiac catheterization 2005    "that what sent me to CABG"  . Cataract extraction w/ intraocular lens  implant, bilateral ~ 01/2011    Current Outpatient Prescriptions  Medication Sig Dispense Refill  . allopurinol (ZYLOPRIM) 300 MG tablet Take 300 mg by mouth daily.        Marland Kitchen atorvastatin (LIPITOR) 40 MG tablet Take 40 mg by mouth at bedtime.      . carvedilol (COREG) 6.25 MG tablet Take 1 tablet (6.25 mg total) by mouth 2 (two) times daily.  60 tablet  11  . lisinopril (  PRINIVIL,ZESTRIL) 20 MG tablet Take 20 mg by mouth at bedtime.       . magnesium oxide (MAG-OX) 400 MG tablet Take 1 tablet (400 mg total) by mouth daily.  30 tablet  11  . nitroGLYCERIN (NITROSTAT) 0.4 MG SL tablet Place 1 tablet (0.4 mg total) under the tongue every 5 (five) minutes as needed. For chest pain  25 tablet  3  . Omega-3 Fatty Acids (FISH OIL) 1000 MG CAPS Take 1 capsule by mouth daily.       . sildenafil (VIAGRA) 50 MG tablet Take 1 tablet (50 mg total) by mouth daily as needed. For erectile dysfunction      . sotalol (BETAPACE) 120 MG tablet Take 120 mg by mouth 2 (two) times daily.      . Tamsulosin HCl (FLOMAX) 0.4 MG CAPS Take 0.4 mg by mouth at bedtime.       Marland Kitchen warfarin (COUMADIN) 5 MG tablet Take 1 tablet (5 mg total) by mouth daily. Start with 1-1/2 tabs daily, then as directed.  60 tablet  11    No Known Allergies  History   Social History  . Marital  Status: Married    Spouse Name: N/A    Number of Children: 2  . Years of Education: N/A   Occupational History  . retired-truck driver   .     Social History Main Topics  . Smoking status: Former Smoker -- 0.5 packs/day for 5 years    Types: Cigarettes    Quit date: 01/30/1961  . Smokeless tobacco: Never Used  . Alcohol Use: No     2 drinks (bourbon) a night  . Drug Use: No  . Sexually Active: Yes   Other Topics Concern  . Not on file   Social History Narrative   Married. 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 Quit tobacco 50 years ago..Both parents are deceased, neither one had cardiac issues and no siblings have cardiac issues.     Family History  Problem Relation Age of Onset  . Cancer Mother     Review of Systems:  As stated in the HPI and otherwise negative.   BP 100/71  Pulse 60  Ht 5\' 7"  (1.702 m)  Wt 198 lb (89.812 kg)  BMI 31.01 kg/m2  Physical Examination: General: Well developed, well nourished, NAD HEENT: OP clear, mucus membranes moist SKIN: warm, dry. No rashes. Neuro: No focal deficits Musculoskeletal: Muscle strength 5/5 all ext Psychiatric: Mood and affect normal Neck: No JVD, no carotid bruits, no thyromegaly, no lymphadenopathy. Lungs:Clear bilaterally, no wheezes, rhonci, crackles Cardiovascular: Regular rate and rhythm. No murmurs, gallops or rubs. Abdomen:Soft. Bowel sounds present. Non-tender.  Extremities: No lower extremity edema. Pulses are 2 + in the bilateral DP/PT.

## 2011-09-05 NOTE — Assessment & Plan Note (Signed)
He is on sotalol and coumadin per Dr. Graciela Husbands. Pacemaker in place.

## 2011-09-05 NOTE — Patient Instructions (Addendum)
Your physician wants you to follow-up in:  12 months.  You will receive a reminder letter in the mail two months in advance. If you don't receive a letter, please call our office to schedule the follow-up appointment.   

## 2011-09-05 NOTE — Assessment & Plan Note (Signed)
Stable. He is on good medical therapy including statin, beta blocker. He is not on an ASA secondary to his need for coumadin. Lipids well controlled here last year and more recently per pt in primary care office. BP well controlled.

## 2011-09-07 NOTE — Discharge Summary (Signed)
Patients meds have been changed by Dr. Graciela Husbands and he is ready for discharge.

## 2011-09-08 ENCOUNTER — Ambulatory Visit (INDEPENDENT_AMBULATORY_CARE_PROVIDER_SITE_OTHER): Payer: Medicare Other

## 2011-09-08 DIAGNOSIS — I4891 Unspecified atrial fibrillation: Secondary | ICD-10-CM

## 2011-09-08 DIAGNOSIS — Z7901 Long term (current) use of anticoagulants: Secondary | ICD-10-CM

## 2011-09-08 LAB — POCT INR: INR: 1.7

## 2011-09-14 ENCOUNTER — Ambulatory Visit (INDEPENDENT_AMBULATORY_CARE_PROVIDER_SITE_OTHER): Payer: Medicare Other

## 2011-09-14 ENCOUNTER — Encounter: Payer: Medicare Other | Admitting: Internal Medicine

## 2011-09-14 DIAGNOSIS — Z7901 Long term (current) use of anticoagulants: Secondary | ICD-10-CM

## 2011-09-14 DIAGNOSIS — I4891 Unspecified atrial fibrillation: Secondary | ICD-10-CM

## 2011-09-14 LAB — POCT INR: INR: 2.8

## 2011-09-19 ENCOUNTER — Ambulatory Visit (INDEPENDENT_AMBULATORY_CARE_PROVIDER_SITE_OTHER): Payer: Medicare Other | Admitting: *Deleted

## 2011-09-19 ENCOUNTER — Telehealth: Payer: Self-pay | Admitting: *Deleted

## 2011-09-19 DIAGNOSIS — I4891 Unspecified atrial fibrillation: Secondary | ICD-10-CM

## 2011-09-19 DIAGNOSIS — Z7901 Long term (current) use of anticoagulants: Secondary | ICD-10-CM

## 2011-09-19 LAB — POCT INR: INR: 4.4

## 2011-09-19 NOTE — Telephone Encounter (Signed)
The patient had stopped at the desk while in the office today to question when his follow up with Dr. Graciela Husbands should be. In reviewing with Dr. Graciela Husbands, this should be in about 4 months. I have left a message for the patient to call.

## 2011-09-20 NOTE — Telephone Encounter (Signed)
I spoke with the patient regarding Dr. Odessa Fleming recommendation to see him in 4 months. The patient has been set for follow up on Friday 01/05/12 at 10:30 am.

## 2011-09-25 ENCOUNTER — Other Ambulatory Visit: Payer: Self-pay | Admitting: Pharmacist

## 2011-10-01 ENCOUNTER — Other Ambulatory Visit: Payer: Self-pay | Admitting: Cardiovascular Disease

## 2011-10-03 ENCOUNTER — Ambulatory Visit (INDEPENDENT_AMBULATORY_CARE_PROVIDER_SITE_OTHER): Payer: Medicare Other | Admitting: *Deleted

## 2011-10-03 DIAGNOSIS — I4891 Unspecified atrial fibrillation: Secondary | ICD-10-CM

## 2011-10-03 DIAGNOSIS — Z7901 Long term (current) use of anticoagulants: Secondary | ICD-10-CM

## 2011-10-03 LAB — POCT INR: INR: 2.2

## 2011-10-23 ENCOUNTER — Ambulatory Visit (INDEPENDENT_AMBULATORY_CARE_PROVIDER_SITE_OTHER): Payer: Medicare Other | Admitting: *Deleted

## 2011-10-23 DIAGNOSIS — Z7901 Long term (current) use of anticoagulants: Secondary | ICD-10-CM

## 2011-10-23 DIAGNOSIS — I4891 Unspecified atrial fibrillation: Secondary | ICD-10-CM

## 2011-10-23 LAB — POCT INR: INR: 3.5

## 2011-11-13 ENCOUNTER — Ambulatory Visit (INDEPENDENT_AMBULATORY_CARE_PROVIDER_SITE_OTHER): Payer: Medicare Other | Admitting: *Deleted

## 2011-11-13 DIAGNOSIS — I4891 Unspecified atrial fibrillation: Secondary | ICD-10-CM

## 2011-11-13 DIAGNOSIS — Z7901 Long term (current) use of anticoagulants: Secondary | ICD-10-CM

## 2011-11-13 LAB — POCT INR: INR: 3.1

## 2011-12-11 ENCOUNTER — Ambulatory Visit (INDEPENDENT_AMBULATORY_CARE_PROVIDER_SITE_OTHER): Payer: Medicare Other | Admitting: Pharmacist

## 2011-12-11 DIAGNOSIS — I4891 Unspecified atrial fibrillation: Secondary | ICD-10-CM

## 2011-12-11 DIAGNOSIS — Z7901 Long term (current) use of anticoagulants: Secondary | ICD-10-CM

## 2011-12-15 ENCOUNTER — Other Ambulatory Visit: Payer: Self-pay | Admitting: Physician Assistant

## 2012-01-05 ENCOUNTER — Encounter: Payer: Self-pay | Admitting: Internal Medicine

## 2012-01-05 ENCOUNTER — Ambulatory Visit (INDEPENDENT_AMBULATORY_CARE_PROVIDER_SITE_OTHER): Payer: Medicare Other | Admitting: *Deleted

## 2012-01-05 ENCOUNTER — Ambulatory Visit (INDEPENDENT_AMBULATORY_CARE_PROVIDER_SITE_OTHER): Payer: Medicare Other | Admitting: Internal Medicine

## 2012-01-05 VITALS — BP 132/70 | HR 59 | Resp 18 | Ht 67.0 in | Wt 198.5 lb

## 2012-01-05 DIAGNOSIS — I2510 Atherosclerotic heart disease of native coronary artery without angina pectoris: Secondary | ICD-10-CM

## 2012-01-05 DIAGNOSIS — I4891 Unspecified atrial fibrillation: Secondary | ICD-10-CM

## 2012-01-05 DIAGNOSIS — R42 Dizziness and giddiness: Secondary | ICD-10-CM

## 2012-01-05 DIAGNOSIS — Z7901 Long term (current) use of anticoagulants: Secondary | ICD-10-CM

## 2012-01-05 DIAGNOSIS — I495 Sick sinus syndrome: Secondary | ICD-10-CM

## 2012-01-05 DIAGNOSIS — I10 Essential (primary) hypertension: Secondary | ICD-10-CM

## 2012-01-05 DIAGNOSIS — Z950 Presence of cardiac pacemaker: Secondary | ICD-10-CM

## 2012-01-05 LAB — PACEMAKER DEVICE OBSERVATION
AL IMPEDENCE PM: 525 Ohm
AL THRESHOLD: 0.5 V
ATRIAL PACING PM: 90
BAMS-0003: 70 {beats}/min
RV LEAD IMPEDENCE PM: 487.5 Ohm
VENTRICULAR PACING PM: 24

## 2012-01-05 NOTE — Progress Notes (Signed)
Patient Care Team: Earley Abide as PCP - General (Family Medicine)   HPI  Dylan Roth is a 71 y.o. male  Seen in followup for atrial fibrillation posttermination pauses for which pacemaker implantation 413. He has been treated with dofetilide and Rivaroxaban   He has infrequent palpitations.His biggest complaint is orthostatic lightheadedness  This is worse at night   Past Medical History  Diagnosis Date  . Atrial fibrillation/flutter   . 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 Sinus node dysfunction     post termination pauses <5sec  . Hyperlipidemia   . Hypertension   . Central obesity   . Gout     "once; I have it under control w/medicine"  . BPH (benign prostatic hyperplasia)   . Gall stones   . Chronic back pain   . Chronic shoulder pain   . Angina   . Arthritis     "in my back"    Past Surgical History  Procedure Date  . Coronary artery bypass graft 2005    in [place] - LIMA to LAD, SVG to diagonal, SVG to ramus, SVG to OM and SVG to RCA   . Cholecystectomy 1978   . Cardiac catheterization 2005    "that what sent me to CABG"  . Cataract extraction w/ intraocular lens  implant, bilateral ~ 01/2011    Current Outpatient Prescriptions  Medication Sig Dispense Refill  . allopurinol (ZYLOPRIM) 300 MG tablet Take 300 mg by mouth daily.        Marland Kitchen atorvastatin (LIPITOR) 40 MG tablet Take 40 mg by mouth at bedtime.      Marland Kitchen atorvastatin (LIPITOR) 40 MG tablet TAKE ONE TABLET BY MOUTH EVERY DAY  30 tablet  12  . carvedilol (COREG) 6.25 MG tablet Take 1 tablet (6.25 mg total) by mouth 2 (two) times daily.  60 tablet  11  . finasteride (PROSCAR) 5 MG tablet       . lisinopril (PRINIVIL,ZESTRIL) 20 MG tablet Take 20 mg by mouth at bedtime.       . magnesium oxide (MAG-OX) 400 MG tablet Take 1 tablet (400 mg total) by mouth daily.  30 tablet  11  .  nitroGLYCERIN (NITROSTAT) 0.4 MG SL tablet Place 1 tablet (0.4 mg total) under the tongue every 5 (five) minutes as needed. For chest pain  25 tablet  3  . Omega-3 Fatty Acids (FISH OIL) 1000 MG CAPS Take 1 capsule by mouth daily.       . sildenafil (VIAGRA) 50 MG tablet Take 1 tablet (50 mg total) by mouth daily as needed. For erectile dysfunction      . sotalol (BETAPACE) 120 MG tablet TAKE ONE TABLET BY MOUTH EVERY 12 HOURS  180 tablet  2  . Tamsulosin HCl (FLOMAX) 0.4 MG CAPS Take 0.4 mg by mouth at bedtime.       Marland Kitchen warfarin (COUMADIN) 5 MG tablet Take 1 tablet (5 mg total) by mouth daily. Start with 1-1/2 tabs daily, then as directed.  60 tablet  11    No Known Allergies  Review of Systems negative except from HPI and PMH  Physical Exam BP 132/70  Pulse 59  Resp 18  Ht 5\' 7"  (1.702 m)  Wt 198 lb 8 oz (90.039 kg)  BMI 31.09 kg/m2  SpO2 96% Well developed and well nourished in  no acute distress HENT normal E scleral and icterus clear Neck Supple JVP flat; carotids brisk and full Clear to ausculation Device pocket well healed; without hematoma or erythema  Regular rate and rhythm, no murmurs gallops or rub Soft with active bowel sounds No clubbing cyanosis none Edema Alert and oriented, grossly normal motor and sensory function Skin Warm and Dry    electrocardiogram demonstrates atrial pacing at 60 intervals 25/09/41 has an axis of 45  Assessment and  Plan

## 2012-01-05 NOTE — Assessment & Plan Note (Signed)
Blood pressure well controlled long-standing hypertension may be contributing to the orthostatic intolerance

## 2012-01-05 NOTE — Assessment & Plan Note (Signed)
The patient has orthostatic lightheadedness which may result from hypertension or his Proscar. I've asked that he review the latter with Dr. Isabel Caprice at his next visit. We have also reviewed maneuvers to mitigate orthostatic hypotension, specifically sitting on the side of the bed and isometric contraction of the lower extremities

## 2012-01-05 NOTE — Assessment & Plan Note (Signed)
Stable on current meds 

## 2012-01-05 NOTE — Assessment & Plan Note (Signed)
Infrequent atrial fibrillation on sotalol which he is tolerating. Laboratories are followed by Dr. Carolyne Fiscal in [place]. I've asked him to make sure his potassium level is greater than 4 QTC today is within range

## 2012-01-05 NOTE — Patient Instructions (Signed)
Your physician wants you to follow-up in: 6 MONTHS WITH DR KLEIN You will receive a reminder letter in the mail two months in advance. If you don't receive a letter, please call our office to schedule the follow-up appointment.  

## 2012-01-05 NOTE — Assessment & Plan Note (Signed)
The patient's device was interrogated.  The information was reviewed. No changes were made in the programming.    

## 2012-01-31 HISTORY — PX: COLONOSCOPY: SHX174

## 2012-01-31 HISTORY — PX: PACEMAKER INSERTION: SHX728

## 2012-02-05 ENCOUNTER — Ambulatory Visit (INDEPENDENT_AMBULATORY_CARE_PROVIDER_SITE_OTHER): Payer: Medicare Other

## 2012-02-05 DIAGNOSIS — I4891 Unspecified atrial fibrillation: Secondary | ICD-10-CM

## 2012-02-05 DIAGNOSIS — Z7901 Long term (current) use of anticoagulants: Secondary | ICD-10-CM

## 2012-02-05 LAB — POCT INR: INR: 2.1

## 2012-03-18 ENCOUNTER — Ambulatory Visit (INDEPENDENT_AMBULATORY_CARE_PROVIDER_SITE_OTHER): Payer: Medicare Other

## 2012-03-18 DIAGNOSIS — I4891 Unspecified atrial fibrillation: Secondary | ICD-10-CM

## 2012-03-18 DIAGNOSIS — Z7901 Long term (current) use of anticoagulants: Secondary | ICD-10-CM

## 2012-03-18 LAB — POCT INR: INR: 1.3

## 2012-04-24 ENCOUNTER — Ambulatory Visit (INDEPENDENT_AMBULATORY_CARE_PROVIDER_SITE_OTHER): Payer: Medicare Other | Admitting: *Deleted

## 2012-04-24 DIAGNOSIS — I4891 Unspecified atrial fibrillation: Secondary | ICD-10-CM

## 2012-04-24 DIAGNOSIS — Z7901 Long term (current) use of anticoagulants: Secondary | ICD-10-CM

## 2012-05-06 ENCOUNTER — Ambulatory Visit (INDEPENDENT_AMBULATORY_CARE_PROVIDER_SITE_OTHER): Payer: Medicare Other | Admitting: Pharmacist

## 2012-05-06 DIAGNOSIS — Z7901 Long term (current) use of anticoagulants: Secondary | ICD-10-CM

## 2012-05-06 DIAGNOSIS — I4891 Unspecified atrial fibrillation: Secondary | ICD-10-CM

## 2012-05-06 LAB — POCT INR: INR: 2.3

## 2012-05-14 ENCOUNTER — Other Ambulatory Visit: Payer: Self-pay | Admitting: Physician Assistant

## 2012-05-20 ENCOUNTER — Ambulatory Visit (INDEPENDENT_AMBULATORY_CARE_PROVIDER_SITE_OTHER): Payer: Medicare Other | Admitting: Pharmacist

## 2012-05-20 ENCOUNTER — Encounter: Payer: Self-pay | Admitting: Pharmacist

## 2012-05-20 DIAGNOSIS — I4891 Unspecified atrial fibrillation: Secondary | ICD-10-CM

## 2012-05-20 DIAGNOSIS — Z7901 Long term (current) use of anticoagulants: Secondary | ICD-10-CM

## 2012-05-20 LAB — POCT INR: INR: 3.1

## 2012-06-10 ENCOUNTER — Ambulatory Visit (INDEPENDENT_AMBULATORY_CARE_PROVIDER_SITE_OTHER): Payer: Medicare Other

## 2012-06-10 DIAGNOSIS — Z7901 Long term (current) use of anticoagulants: Secondary | ICD-10-CM

## 2012-06-10 DIAGNOSIS — I4891 Unspecified atrial fibrillation: Secondary | ICD-10-CM

## 2012-07-01 ENCOUNTER — Ambulatory Visit (INDEPENDENT_AMBULATORY_CARE_PROVIDER_SITE_OTHER): Payer: Medicare Other | Admitting: *Deleted

## 2012-07-01 DIAGNOSIS — I4891 Unspecified atrial fibrillation: Secondary | ICD-10-CM

## 2012-07-01 DIAGNOSIS — Z7901 Long term (current) use of anticoagulants: Secondary | ICD-10-CM

## 2012-07-22 ENCOUNTER — Ambulatory Visit (INDEPENDENT_AMBULATORY_CARE_PROVIDER_SITE_OTHER): Payer: Medicare Other | Admitting: *Deleted

## 2012-07-22 DIAGNOSIS — Z7901 Long term (current) use of anticoagulants: Secondary | ICD-10-CM

## 2012-07-22 DIAGNOSIS — I4891 Unspecified atrial fibrillation: Secondary | ICD-10-CM

## 2012-07-30 ENCOUNTER — Ambulatory Visit (INDEPENDENT_AMBULATORY_CARE_PROVIDER_SITE_OTHER): Payer: Medicare Other | Admitting: Internal Medicine

## 2012-07-30 ENCOUNTER — Encounter: Payer: Self-pay | Admitting: Internal Medicine

## 2012-07-30 VITALS — BP 127/74 | HR 52 | Ht 66.0 in | Wt 204.0 lb

## 2012-07-30 DIAGNOSIS — Z950 Presence of cardiac pacemaker: Secondary | ICD-10-CM

## 2012-07-30 DIAGNOSIS — I4891 Unspecified atrial fibrillation: Secondary | ICD-10-CM

## 2012-07-30 DIAGNOSIS — I495 Sick sinus syndrome: Secondary | ICD-10-CM

## 2012-07-30 LAB — PACEMAKER DEVICE OBSERVATION
AL THRESHOLD: 0.625 V
BAMS-0001: 150 {beats}/min
BAMS-0003: 70 {beats}/min
BATTERY VOLTAGE: 2.9629 V
DEVICE MODEL PM: 7322329
RV LEAD AMPLITUDE: 12 mv
RV LEAD THRESHOLD: 0.875 V

## 2012-07-30 NOTE — Assessment & Plan Note (Signed)
No recurrent atrial fibrillation.

## 2012-07-30 NOTE — Assessment & Plan Note (Signed)
The patient's device was interrogated.  The information was reviewed. No changes were made in the programming.    

## 2012-07-30 NOTE — Assessment & Plan Note (Signed)
91% pacing

## 2012-07-30 NOTE — Patient Instructions (Addendum)
Your physician wants you to follow-up in: ONE YEAR WITH DR KLEIN You will receive a reminder letter in the mail two months in advance. If you don't receive a letter, please call our office to schedule the follow-up appointment.    

## 2012-07-30 NOTE — Progress Notes (Signed)
Patient Care Team: Earley Abide as PCP - General (Family Medicine)   HPI  Dylan Roth is a 72 y.o. male Seen in followup for atrial fibrillation with  posttermination pauses for which he underwent pacemaker implantation 4/13. He has been treated with sotalol and Rivaroxaban  He has infrequent palpitations.   No recurrent orthostatic symptoms     Past Medical History  Diagnosis Date  . Atrial fibrillation/flutter   . 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 Sinus node dysfunction     post termination pauses <5sec  . Hyperlipidemia   . Hypertension   . Central obesity   . Gout     "once; I have it under control w/medicine"  . BPH (benign prostatic hyperplasia)   . Gall stones   . Chronic back pain   . Chronic shoulder pain   . Angina   . Arthritis     "in my back"    Past Surgical History  Procedure Laterality Date  . Coronary artery bypass graft  2005    in [place] - LIMA to LAD, SVG to diagonal, SVG to ramus, SVG to OM and SVG to RCA   . Cholecystectomy  1978   . Cardiac catheterization  2005    "that what sent me to CABG"  . Cataract extraction w/ intraocular lens  implant, bilateral  ~ 01/2011    Current Outpatient Prescriptions  Medication Sig Dispense Refill  . allopurinol (ZYLOPRIM) 300 MG tablet Take 300 mg by mouth daily.        Marland Kitchen atorvastatin (LIPITOR) 40 MG tablet Take 40 mg by mouth at bedtime.      Marland Kitchen atorvastatin (LIPITOR) 40 MG tablet TAKE ONE TABLET BY MOUTH EVERY DAY  30 tablet  12  . carvedilol (COREG) 6.25 MG tablet Take 1 tablet (6.25 mg total) by mouth 2 (two) times daily.  60 tablet  11  . finasteride (PROSCAR) 5 MG tablet       . lisinopril (PRINIVIL,ZESTRIL) 20 MG tablet Take 20 mg by mouth at bedtime.       . magnesium oxide (MAG-OX) 400 MG tablet Take 1 tablet (400 mg total) by mouth daily.  30 tablet  11  . nitroGLYCERIN  (NITROSTAT) 0.4 MG SL tablet Place 1 tablet (0.4 mg total) under the tongue every 5 (five) minutes as needed. For chest pain  25 tablet  3  . Omega-3 Fatty Acids (FISH OIL) 1000 MG CAPS Take 1 capsule by mouth daily.       . sildenafil (VIAGRA) 50 MG tablet Take 1 tablet (50 mg total) by mouth daily as needed. For erectile dysfunction      . sotalol (BETAPACE) 120 MG tablet TAKE ONE TABLET BY MOUTH EVERY 12 HOURS  180 tablet  2  . Tamsulosin HCl (FLOMAX) 0.4 MG CAPS Take 0.4 mg by mouth at bedtime.       Marland Kitchen warfarin (COUMADIN) 5 MG tablet Take as directed by anticoagulation clinic  30 tablet  3   No current facility-administered medications for this visit.    No Known Allergies  Review of Systems negative except from HPI and PMH  Physical Exam BP 127/74  Pulse 52  Ht 5\' 6"  (1.676 m)  Wt 204 lb (92.534 kg)  BMI 32.94 kg/m2 Well developed and nourished in no acute distress HENT normal Neck supple  with JVP-flat Clear Regular rate and rhythm, no murmurs or gallops Abd-soft with active BS No Clubbing cyanosis edema Skin-warm and dry A & Oriented  Grossly normal sensory and motor function  ECG: atrial paced  @60             Intervals  22/09/41  Axis 41     Assessment and  Plan

## 2012-08-19 ENCOUNTER — Ambulatory Visit (INDEPENDENT_AMBULATORY_CARE_PROVIDER_SITE_OTHER): Payer: Medicare Other | Admitting: *Deleted

## 2012-08-19 DIAGNOSIS — Z7901 Long term (current) use of anticoagulants: Secondary | ICD-10-CM

## 2012-08-19 DIAGNOSIS — I4891 Unspecified atrial fibrillation: Secondary | ICD-10-CM

## 2012-08-24 ENCOUNTER — Other Ambulatory Visit: Payer: Self-pay | Admitting: Cardiovascular Disease

## 2012-08-26 ENCOUNTER — Other Ambulatory Visit: Payer: Self-pay | Admitting: *Deleted

## 2012-09-16 ENCOUNTER — Ambulatory Visit (INDEPENDENT_AMBULATORY_CARE_PROVIDER_SITE_OTHER): Payer: Medicare Other

## 2012-09-16 DIAGNOSIS — I4891 Unspecified atrial fibrillation: Secondary | ICD-10-CM

## 2012-09-16 DIAGNOSIS — Z7901 Long term (current) use of anticoagulants: Secondary | ICD-10-CM

## 2012-09-23 ENCOUNTER — Other Ambulatory Visit: Payer: Self-pay | Admitting: Cardiovascular Disease

## 2012-10-03 ENCOUNTER — Ambulatory Visit (INDEPENDENT_AMBULATORY_CARE_PROVIDER_SITE_OTHER): Payer: Medicare Other | Admitting: Cardiovascular Disease

## 2012-10-03 ENCOUNTER — Encounter: Payer: Self-pay | Admitting: Cardiovascular Disease

## 2012-10-03 VITALS — BP 105/66 | HR 60 | Ht 66.0 in | Wt 203.0 lb

## 2012-10-03 DIAGNOSIS — I4891 Unspecified atrial fibrillation: Secondary | ICD-10-CM

## 2012-10-03 DIAGNOSIS — I2510 Atherosclerotic heart disease of native coronary artery without angina pectoris: Secondary | ICD-10-CM

## 2012-10-03 NOTE — Progress Notes (Signed)
'  History of Present Illness: 72 yo WM with history of CAD s/p 5V CABG 2005, atrial fibrillation, HTN, HLD, hyperkalemia, obesity, chronic back and shoulder pain who is here today for cardiac follow up. He has been evaluated in the past for palpitations. Echo September 2010 showed EF 60-65% mild AI/TR. Stress Myoview 6/11 EF normal. Normal perfusion. I saw him 01/10/11 for the first time. His EKG showed atrial flutter. I started Cardizem CD 120 mg po Qdaily and xarelto. I had him wear a monitor for 48 hours. He had sinus rhythm with episodes of atrial flutter. He also had 4 second pauses. His cardizem was held and Coreg was lowered. He saw Dr. Graciela Husbands in April 2013 and was started on dofetilide for atrial fibrillation. He had post-termination pauses and had a pacemaker implanted in April 2013. He has since had Tikosyn stopped due to cost and has been loaded with sotalol. He has also has been switched from Xarelto to coumadin. This was all based on cost. He last saw Dr. Graciela Husbands July 2014.   He tells me today that he has been doing well. He has rare palpitations that are short lived. No chest pain or SOB. He has some soreness in his chest wall. No dizziness, near syncope or syncope. Overall feeling well.   Primary Care Physician: Joetta Manners  Last Lipid Profile: Followed in primary care  Past Medical History  Diagnosis Date  . Atrial fibrillation/flutter   . 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 Sinus node dysfunction     post termination pauses <5sec  . Hyperlipidemia   . Hypertension   . Central obesity   . Gout     "once; I have it under control w/medicine"  . BPH (benign prostatic hyperplasia)   . Gall stones   . Chronic back pain   . Chronic shoulder pain   . Angina   . Arthritis     "in my back"    Past Surgical History  Procedure Laterality Date  . Coronary artery bypass graft   2005    in [place] - LIMA to LAD, SVG to diagonal, SVG to ramus, SVG to OM and SVG to RCA   . Cholecystectomy  1978   . Cardiac catheterization  2005    "that what sent me to CABG"  . Cataract extraction w/ intraocular lens  implant, bilateral  ~ 01/2011    Current Outpatient Prescriptions  Medication Sig Dispense Refill  . allopurinol (ZYLOPRIM) 300 MG tablet Take 300 mg by mouth daily.        Marland Kitchen atorvastatin (LIPITOR) 40 MG tablet TAKE ONE TABLET BY MOUTH EVERY DAY  30 tablet  12  . carvedilol (COREG) 12.5 MG tablet       . finasteride (PROSCAR) 5 MG tablet Take 5 mg by mouth daily.       Marland Kitchen lisinopril (PRINIVIL,ZESTRIL) 20 MG tablet Take 20 mg by mouth at bedtime.       . nitroGLYCERIN (NITROSTAT) 0.4 MG SL tablet Place 1 tablet (0.4 mg total) under the tongue every 5 (five) minutes as needed. For chest pain  25 tablet  3  . Omega-3 Fatty Acids (FISH OIL) 1000 MG CAPS Take 1 capsule by mouth daily.       . sildenafil (VIAGRA) 50 MG tablet Take 1 tablet (50 mg total) by mouth daily  as needed. For erectile dysfunction      . sotalol (BETAPACE) 120 MG tablet TAKE ONE TABLET BY MOUTH EVERY 12 HOURS  180 tablet  1  . Tamsulosin HCl (FLOMAX) 0.4 MG CAPS Take 0.4 mg by mouth 2 (two) times daily.       Marland Kitchen warfarin (COUMADIN) 5 MG tablet Take as directed by anticoagulation clinic  30 tablet  3   No current facility-administered medications for this visit.    No Known Allergies  History   Social History  . Marital Status: Married    Spouse Name: N/A    Number of Children: 2  . Years of Education: N/A   Occupational History  . retired-truck driver   .     Social History Main Topics  . Smoking status: Former Smoker -- 0.50 packs/day for 5 years    Types: Cigarettes    Quit date: 01/30/1961  . Smokeless tobacco: Never Used  . Alcohol Use: No     Comment: 2 drinks (bourbon) a night  . Drug Use: No  . Sexual Activity: Yes   Other Topics Concern  . Not on file   Social  History Narrative   Married. 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 Quit tobacco 50 years ago..Both parents are deceased, neither one had cardiac issues and no siblings have cardiac issues.     Family History  Problem Relation Age of Onset  . Cancer Mother     Review of Systems:  As stated in the HPI and otherwise negative.   BP 105/66  Pulse 60  Ht 5\' 6"  (1.676 m)  Wt 203 lb (92.08 kg)  BMI 32.78 kg/m2  Physical Examination: General: Well developed, well nourished, NAD HEENT: OP clear, mucus membranes moist SKIN: warm, dry. No rashes. Neuro: No focal deficits Musculoskeletal: Muscle strength 5/5 all ext Psychiatric: Mood and affect normal Neck: No JVD, no carotid bruits, no thyromegaly, no lymphadenopathy. Lungs:Clear bilaterally, no wheezes, rhonci, crackles Cardiovascular: Regular rate and rhythm. No murmurs, gallops or rubs. Abdomen:Soft. Bowel sounds present. Non-tender.  Extremities: No lower extremity edema. Pulses are 2 + in the bilateral DP/PT.  Assessment and Plan:   1. CAD: Stable. He is on good medical therapy including statin, beta blocker. He is not on an ASA secondary to his need for coumadin. Lipids followed in primary care office. BP well controlled. Last echo 2010. Will repeat echo to assess LV size and function.   2. Atrial fibrillation: He is on sotalol and coumadin per Dr. Graciela Husbands. Pacemaker in place.

## 2012-10-03 NOTE — Patient Instructions (Addendum)

## 2012-10-04 ENCOUNTER — Other Ambulatory Visit: Payer: Self-pay | Admitting: Cardiovascular Disease

## 2012-10-04 ENCOUNTER — Telehealth: Payer: Self-pay | Admitting: *Deleted

## 2012-10-04 NOTE — Telephone Encounter (Signed)
Letter from Dr. Clifton James regarding cardiac issues and coumadin compliance faxed to [facility]. I spoke with pt and gave him this information.

## 2012-10-20 ENCOUNTER — Other Ambulatory Visit: Payer: Self-pay | Admitting: Cardiovascular Disease

## 2012-10-29 ENCOUNTER — Ambulatory Visit (HOSPITAL_COMMUNITY): Payer: Medicare Other | Attending: Cardiovascular Disease | Admitting: Radiology

## 2012-10-29 ENCOUNTER — Ambulatory Visit (INDEPENDENT_AMBULATORY_CARE_PROVIDER_SITE_OTHER): Payer: Medicare Other

## 2012-10-29 DIAGNOSIS — I4891 Unspecified atrial fibrillation: Secondary | ICD-10-CM

## 2012-10-29 DIAGNOSIS — Z7901 Long term (current) use of anticoagulants: Secondary | ICD-10-CM

## 2012-10-29 DIAGNOSIS — I10 Essential (primary) hypertension: Secondary | ICD-10-CM | POA: Insufficient documentation

## 2012-10-29 DIAGNOSIS — I252 Old myocardial infarction: Secondary | ICD-10-CM | POA: Insufficient documentation

## 2012-10-29 DIAGNOSIS — E669 Obesity, unspecified: Secondary | ICD-10-CM | POA: Insufficient documentation

## 2012-10-29 DIAGNOSIS — Z87891 Personal history of nicotine dependence: Secondary | ICD-10-CM | POA: Insufficient documentation

## 2012-10-29 DIAGNOSIS — E785 Hyperlipidemia, unspecified: Secondary | ICD-10-CM | POA: Insufficient documentation

## 2012-10-29 DIAGNOSIS — I2510 Atherosclerotic heart disease of native coronary artery without angina pectoris: Secondary | ICD-10-CM

## 2012-10-29 NOTE — Progress Notes (Signed)
Echocardiogram performed.  

## 2012-10-30 ENCOUNTER — Telehealth: Payer: Self-pay | Admitting: Cardiovascular Disease

## 2012-10-30 NOTE — Telephone Encounter (Signed)
Follow up ° ° °Pt returning your call °

## 2012-10-30 NOTE — Telephone Encounter (Signed)
Left message to call back  

## 2012-10-30 NOTE — Telephone Encounter (Signed)
Spoke with pt and reviewed echo results with him.  

## 2012-10-30 NOTE — Telephone Encounter (Signed)
Follow Up: ° ° ° °Pt returning call for results.  °

## 2012-11-15 ENCOUNTER — Other Ambulatory Visit: Payer: Self-pay | Admitting: Cardiovascular Disease

## 2012-12-08 IMAGING — CR DG CHEST 2V
2 series · 2 of 2 positions shown · non-contrast
Comparison: None.

CLINICAL DATA: Postop from pacemaker placement.  Coronary artery
disease.

CHEST - 2 VIEW

[w chest pa]
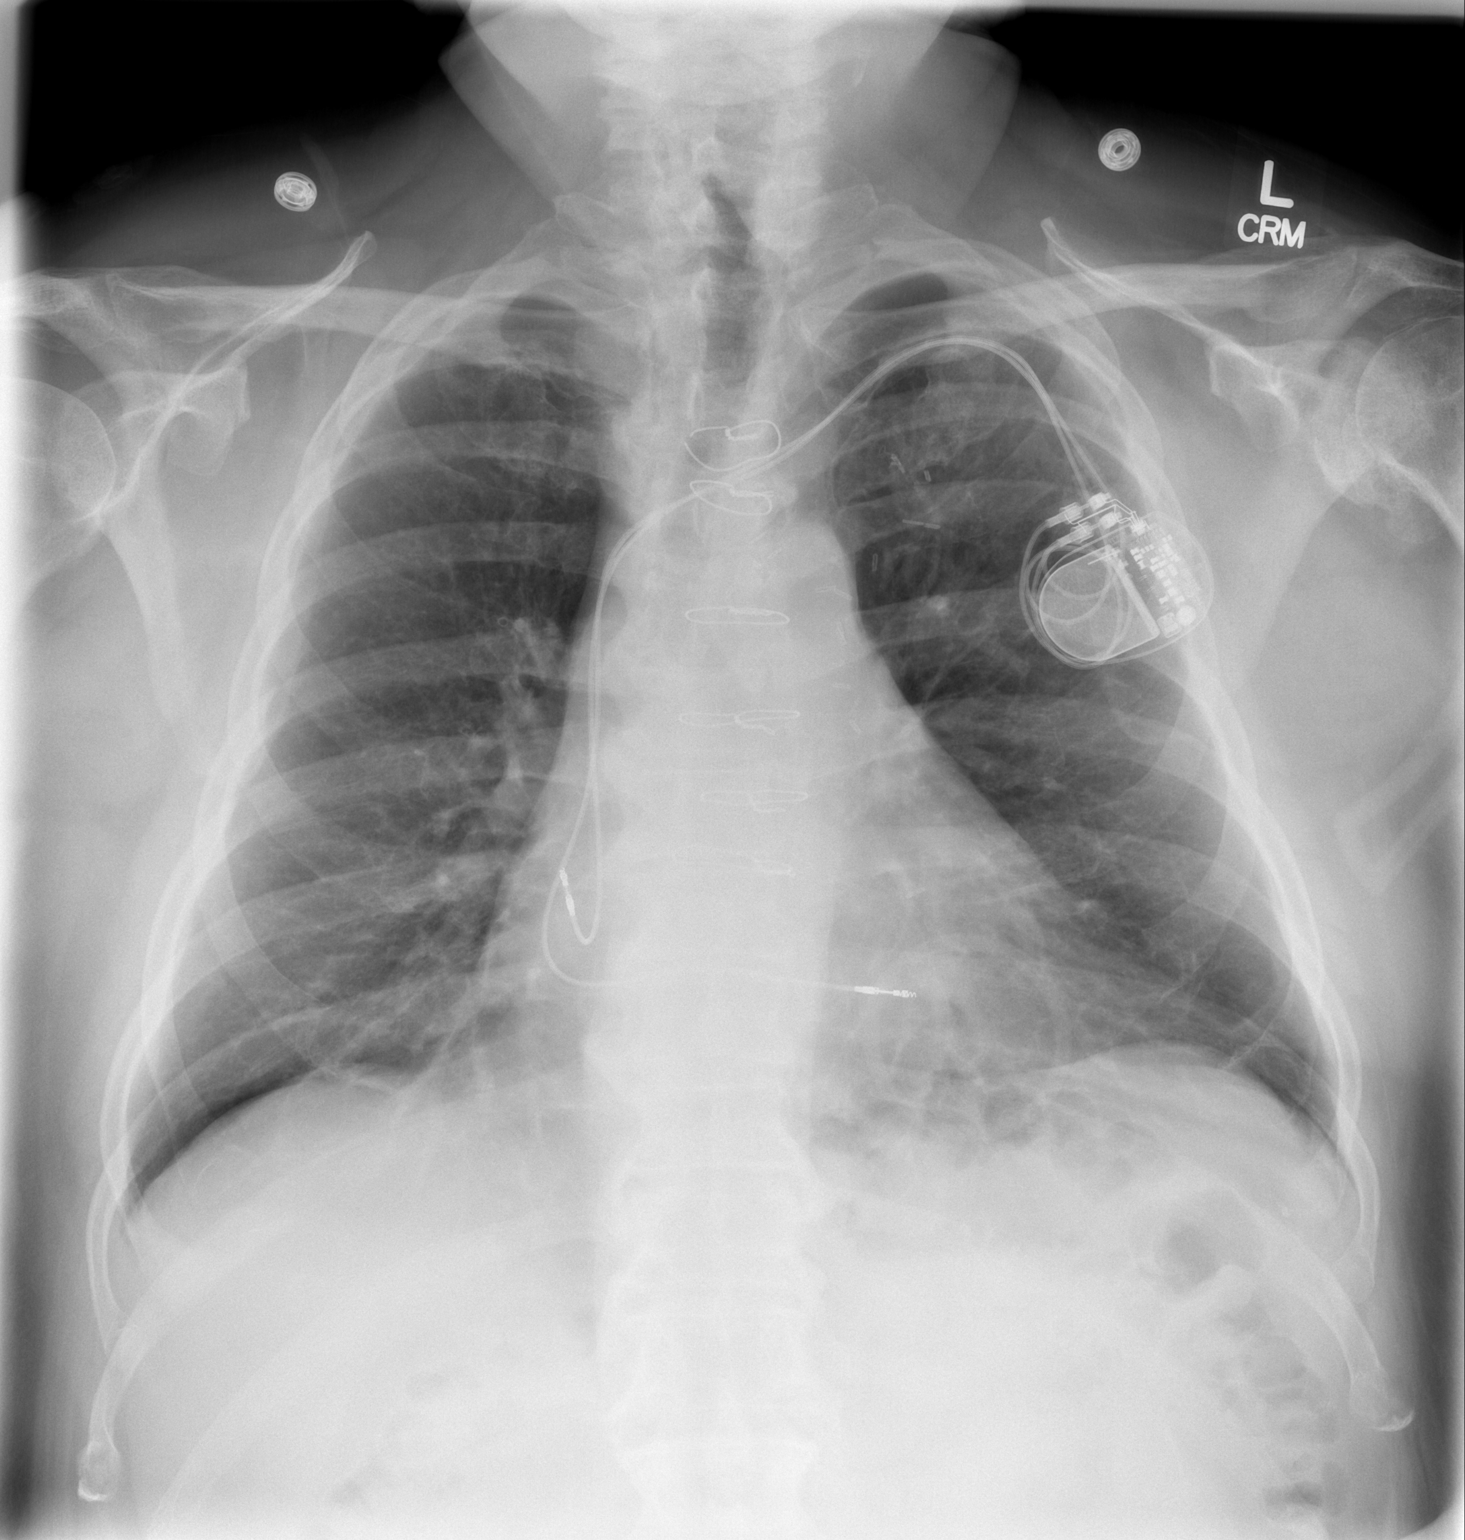

[w chest lat]
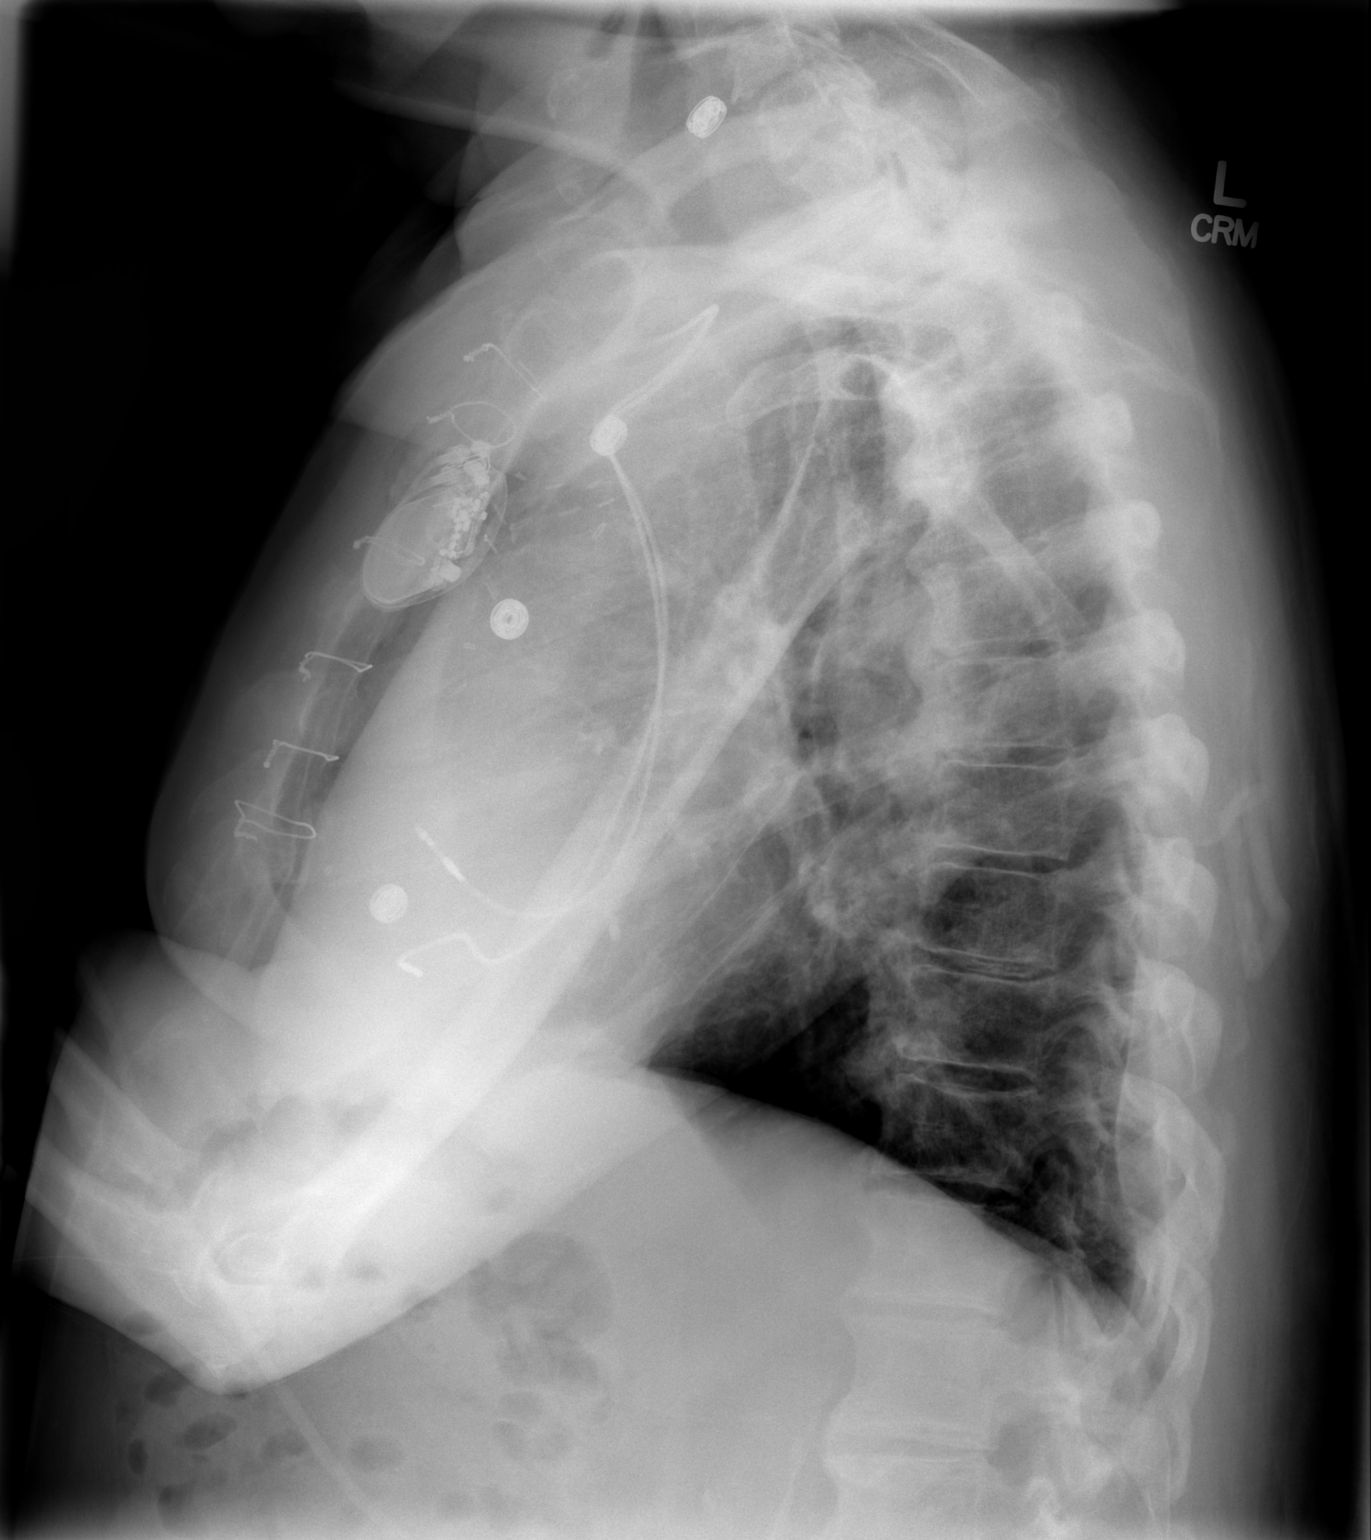

[2 of 2 positions shown; findings below may reference images not displayed]

FINDINGS: A dual lead transvenous pacemaker is seen with leads
within the right atrium and right ventricle.  No evidence of
pneumothorax.  Both lungs are clear.  Heart size is within normal
limits.  No mass or lymphadenopathy identified.
IMPRESSION: Dual lead transvenous pacemaker in appropriate position.  No
evidence of pneumothorax or other active disease.

## 2012-12-10 ENCOUNTER — Ambulatory Visit (INDEPENDENT_AMBULATORY_CARE_PROVIDER_SITE_OTHER): Payer: Medicare Other | Admitting: *Deleted

## 2012-12-10 DIAGNOSIS — I4891 Unspecified atrial fibrillation: Secondary | ICD-10-CM

## 2012-12-10 DIAGNOSIS — Z7901 Long term (current) use of anticoagulants: Secondary | ICD-10-CM

## 2012-12-10 LAB — POCT INR: INR: 2.8

## 2012-12-27 ENCOUNTER — Telehealth: Payer: Self-pay | Admitting: Cardiovascular Disease

## 2012-12-27 ENCOUNTER — Encounter: Payer: Self-pay | Admitting: Internal Medicine

## 2012-12-27 DIAGNOSIS — Z950 Presence of cardiac pacemaker: Secondary | ICD-10-CM

## 2012-12-27 DIAGNOSIS — I495 Sick sinus syndrome: Secondary | ICD-10-CM

## 2012-12-27 NOTE — Telephone Encounter (Signed)
New problem    Today pt's Chest is hurting, Palpitations, and Skipped beats.    Pt would like to know if he should come in or take med?

## 2012-12-27 NOTE — Telephone Encounter (Signed)
PT  STATED LAST NIGHT  HEART WAS  RACING  FOR  ABOUT AN HOUR   AND  BEFORE THAT  FELT  LIKE  WAS GOING TO PASS OUT  DISCUSSED WITH PAULA  PT WILL SEND  TRANSMISSION FROM  DEVICE   PENDING  RESULTS  WILL  DISCUSS PLAN OF  CARE .Zack Seal

## 2012-12-27 NOTE — Telephone Encounter (Signed)
Spoke with patient. Merlin shows intermittent A-fib with controlled ventricular rates.  Per Dr. Johney Frame continue medications as ordered and no changes.  I will review the remote transmission with Dr. Graciela Husbands next week.

## 2013-01-17 ENCOUNTER — Encounter: Payer: Self-pay | Admitting: *Deleted

## 2013-01-20 ENCOUNTER — Ambulatory Visit (INDEPENDENT_AMBULATORY_CARE_PROVIDER_SITE_OTHER): Payer: Medicare Other

## 2013-01-20 DIAGNOSIS — I4891 Unspecified atrial fibrillation: Secondary | ICD-10-CM

## 2013-01-20 DIAGNOSIS — Z7901 Long term (current) use of anticoagulants: Secondary | ICD-10-CM

## 2013-01-20 LAB — POCT INR: INR: 3

## 2013-01-29 ENCOUNTER — Other Ambulatory Visit: Payer: Self-pay

## 2013-01-29 DIAGNOSIS — I2510 Atherosclerotic heart disease of native coronary artery without angina pectoris: Secondary | ICD-10-CM

## 2013-01-29 MED ORDER — NITROGLYCERIN 0.4 MG SL SUBL: 0.4000 mg | SUBLINGUAL_TABLET | SUBLINGUAL | Status: DC | PRN

## 2013-03-03 ENCOUNTER — Ambulatory Visit (INDEPENDENT_AMBULATORY_CARE_PROVIDER_SITE_OTHER): Payer: Medicare Other

## 2013-03-03 DIAGNOSIS — I4891 Unspecified atrial fibrillation: Secondary | ICD-10-CM

## 2013-03-03 DIAGNOSIS — Z7901 Long term (current) use of anticoagulants: Secondary | ICD-10-CM

## 2013-03-03 LAB — POCT INR: INR: 2.2

## 2013-03-07 ENCOUNTER — Telehealth: Payer: Self-pay | Admitting: Cardiovascular Disease

## 2013-03-07 ENCOUNTER — Ambulatory Visit (INDEPENDENT_AMBULATORY_CARE_PROVIDER_SITE_OTHER): Payer: Medicare Other | Admitting: Cardiovascular Disease

## 2013-03-07 ENCOUNTER — Encounter: Payer: Self-pay | Admitting: Cardiovascular Disease

## 2013-03-07 VITALS — BP 120/86 | HR 63 | Ht 66.0 in | Wt 205.0 lb

## 2013-03-07 DIAGNOSIS — I4891 Unspecified atrial fibrillation: Secondary | ICD-10-CM

## 2013-03-07 DIAGNOSIS — I2510 Atherosclerotic heart disease of native coronary artery without angina pectoris: Secondary | ICD-10-CM

## 2013-03-07 DIAGNOSIS — R079 Chest pain, unspecified: Secondary | ICD-10-CM

## 2013-03-07 NOTE — Patient Instructions (Signed)
Your physician wants you to follow-up in:  6 months. You will receive a reminder letter in the mail two months in advance. If you don't receive a letter, please call our office to schedule the follow-up appointment.   

## 2013-03-07 NOTE — Telephone Encounter (Signed)
C/o cp for awhile//months. Exertional cp/ pain stops with rest. Pt has not used nitro, no cp today but states it is happening more frequent.  App available today, pt was excepting. Pt was told to use nitro with cp and go to ED if urgent need. Pt verbalized understanding.

## 2013-03-07 NOTE — Telephone Encounter (Signed)
New Message  Pt called states that he is currently experiencing chest pains// Per pat connect with triage to see if the pt should go to the ER

## 2013-03-07 NOTE — Progress Notes (Signed)
'   History of Present Illness: 73 yo WM with history of CAD s/p 5V CABG 2005, atrial fibrillation, HTN, HLD, hyperkalemia, obesity, chronic back and shoulder pain who is here today for cardiac follow up. He has been evaluated in the past for palpitations. Echo September 2010 showed EF 60-65% mild AI/TR. Stress Myoview 6/11 EF normal. Normal perfusion. I saw him 01/10/11 for the first time. His EKG showed atrial flutter. I started Cardizem CD 120 mg po Qdaily and xarelto. I had him wear a monitor for 48 hours. He had sinus rhythm with episodes of atrial flutter. He also had 4 second pauses. His cardizem was held and Coreg was lowered. He saw Dr. Caryl Comes in April 2013 and was started on dofetilide for atrial fibrillation. He had post-termination pauses and had a pacemaker implanted in April 2013. He has since had Tikosyn stopped due to cost and has been loaded with sotalol. He has also has been switched from Xarelto to coumadin. This was all based on cost. He last saw Dr. Caryl Comes July 2014. Echo 10/29/12 with LVEF=55-60%, mild LVH, no significant valve disease.   He is here today for follow up. He was added on to my schedule today after he called in with chest pain. He has been having pain in his left arm and shoulder with radiation into his left chest wall for the last 5 days. He has been busy in the yard cutting shrubs. His chest did not hurt while working but it began to ache in his left chest wall for 4 days. It is clearly worse with movement of his left arm. His chest is sore touch. No SOB. No palpitations, dizziness, near syncope or syncope. Overall feeling well.  Primary Care Physician: [name]  Last Lipid Profile: Followed in primary care  Past Medical History  Diagnosis Date  . Atrial fibrillation/flutter   . 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 Sinus node dysfunction     post  termination pauses <5sec  . Hyperlipidemia   . Hypertension   . Central obesity   . Gout     "once; I have it under control w/medicine"  . BPH (benign prostatic hyperplasia)   . Gall stones   . Chronic back pain   . Chronic shoulder pain   . Angina   . Arthritis     "in my back"    Past Surgical History  Procedure Laterality Date  . Coronary artery bypass graft  2005    in [place] - LIMA to LAD, SVG to diagonal, SVG to ramus, SVG to OM and SVG to RCA   . Cholecystectomy  1978   . Cardiac catheterization  2005    "that what sent me to CABG"  . Cataract extraction w/ intraocular lens  implant, bilateral  ~ 01/2011    Current Outpatient Prescriptions  Medication Sig Dispense Refill  . allopurinol (ZYLOPRIM) 300 MG tablet Take 300 mg by mouth daily.        Marland Kitchen atorvastatin (LIPITOR) 40 MG tablet TAKE ONE TABLET BY MOUTH EVERY DAY  30 tablet  12  . carvedilol (COREG) 12.5 MG tablet TAKE ONE TABLET BY MOUTH TWICE DAILY  60 tablet  6  . finasteride (PROSCAR) 5 MG tablet Take 5 mg by mouth daily.       Marland Kitchen lisinopril (PRINIVIL,ZESTRIL) 20 MG tablet Take 20  mg by mouth at bedtime.       . nitroGLYCERIN (NITROSTAT) 0.4 MG SL tablet Place 1 tablet (0.4 mg total) under the tongue every 5 (five) minutes as needed. For chest pain  25 tablet  3  . Omega-3 Fatty Acids (FISH OIL) 1000 MG CAPS Take 1 capsule by mouth daily.       . sildenafil (VIAGRA) 50 MG tablet Take 1 tablet (50 mg total) by mouth daily as needed. For erectile dysfunction      . sotalol (BETAPACE) 120 MG tablet TAKE ONE TABLET BY MOUTH EVERY 12 HOURS  180 tablet  1  . Tamsulosin HCl (FLOMAX) 0.4 MG CAPS Take 0.4 mg by mouth 2 (two) times daily.       Marland Kitchen warfarin (COUMADIN) 5 MG tablet TAKE AS DIRECTED BY ANTICOAGULATION CLINIC  30 tablet  3   No current facility-administered medications for this visit.    Allergies  Allergen Reactions  . Sulfur     SWELLING ON PATIENTS LIPS, HE GETS DRIED UP    History   Social  History  . Marital Status: Married    Spouse Name: N/A    Number of Children: 2  . Years of Education: N/A   Occupational History  . retired-truck driver   .     Social History Main Topics  . Smoking status: Former Smoker -- 0.50 packs/day for 5 years    Types: Cigarettes    Quit date: 01/30/1961  . Smokeless tobacco: Never Used  . Alcohol Use: No     Comment: 2 drinks (bourbon) a night  . Drug Use: No  . Sexual Activity: Yes   Other Topics Concern  . Not on file   Social History Narrative   Married. Pt works part-time for a [employer]. Quit tobacco 50 years ago..Both parents are deceased, neither one had cardiac issues and no siblings have cardiac issues.     Family History  Problem Relation Age of Onset  . Cancer Mother     Review of Systems:  As stated in the HPI and otherwise negative.   BP 120/86  Pulse 63  Ht 5\' 6"  (1.676 m)  Wt 205 lb (92.987 kg)  BMI 33.10 kg/m2  Physical Examination: General: Well developed, well nourished, NAD HEENT: OP clear, mucus membranes moist SKIN: warm, dry. No rashes. Neuro: No focal deficits Musculoskeletal: Muscle strength 5/5 all ext Psychiatric: Mood and affect normal Neck: No JVD, no carotid bruits, no thyromegaly, no lymphadenopathy. Lungs:Clear bilaterally, no wheezes, rhonci, crackles Cardiovascular: Regular rate and rhythm. No murmurs, gallops or rubs. Abdomen:Soft. Bowel sounds present. Non-tender.  Extremities: No lower extremity edema. Pulses are 2 + in the bilateral DP/PT.  Echo 09/3012: Left ventricle: The cavity size was normal. Wall thickness was increased in a pattern of mild LVH. Systolic function was normal. The estimated ejection fraction was in the range of 55% to 60%. Wall motion was normal; there were no regional wall motion abnormalities. Doppler parameters are consistent with abnormal left ventricular relaxation (grade 1 diastolic dysfunction).  EKG: Atrial paced, rate 63 bpm. Non-specific T  wave abnormalities. p  Assessment and Plan:   1. Chest pain: His chest wall is very tender to palpation. No rashes. He thinks he strained something cutting bushes this week. He has been dragging big limbs around the yard. EKG does not show ischemia. I think this is musculoskeletal chest pain.   2. CAD: Stable. He is on good medical therapy including statin, beta blocker.  He is not on an ASA secondary to his need for coumadin. Lipids followed in primary care office. BP well controlled.   3. Atrial fibrillation: He is on sotalol and coumadin per Dr. Caryl Comes. Pacemaker in place.

## 2013-03-26 ENCOUNTER — Other Ambulatory Visit: Payer: Self-pay | Admitting: Cardiovascular Disease

## 2013-03-31 ENCOUNTER — Ambulatory Visit (INDEPENDENT_AMBULATORY_CARE_PROVIDER_SITE_OTHER): Payer: Medicare Other | Admitting: *Deleted

## 2013-03-31 ENCOUNTER — Encounter: Payer: Self-pay | Admitting: Internal Medicine

## 2013-03-31 DIAGNOSIS — I4891 Unspecified atrial fibrillation: Secondary | ICD-10-CM

## 2013-03-31 DIAGNOSIS — I495 Sick sinus syndrome: Secondary | ICD-10-CM

## 2013-03-31 LAB — MDC_IDC_ENUM_SESS_TYPE_REMOTE
Battery Remaining Longevity: 101 mo
Battery Voltage: 2.95 V
Brady Statistic AS VP Percent: 1 %
Brady Statistic AS VS Percent: 6.3 %
Brady Statistic RA Percent Paced: 93 %
Date Time Interrogation Session: 20150302144812
Lead Channel Impedance Value: 480 Ohm
Lead Channel Impedance Value: 490 Ohm
Lead Channel Pacing Threshold Pulse Width: 0.4 ms
Lead Channel Setting Pacing Amplitude: 1 V
Lead Channel Setting Pacing Pulse Width: 0.4 ms
MDC IDC MSMT LEADCHNL RA SENSING INTR AMPL: 2.2 mV
MDC IDC MSMT LEADCHNL RV PACING THRESHOLD AMPLITUDE: 0.75 V
MDC IDC MSMT LEADCHNL RV SENSING INTR AMPL: 10.4 mV
MDC IDC PG SERIAL: 7322329
MDC IDC SET LEADCHNL RA PACING AMPLITUDE: 1.5 V
MDC IDC SET LEADCHNL RV SENSING SENSITIVITY: 2 mV
MDC IDC STAT BRADY AP VP PERCENT: 41 %
MDC IDC STAT BRADY AP VS PERCENT: 52 %
MDC IDC STAT BRADY RV PERCENT PACED: 42 %

## 2013-04-14 ENCOUNTER — Ambulatory Visit (INDEPENDENT_AMBULATORY_CARE_PROVIDER_SITE_OTHER): Payer: Medicare Other | Admitting: *Deleted

## 2013-04-14 DIAGNOSIS — Z7901 Long term (current) use of anticoagulants: Secondary | ICD-10-CM

## 2013-04-14 DIAGNOSIS — I4891 Unspecified atrial fibrillation: Secondary | ICD-10-CM

## 2013-04-14 LAB — POCT INR: INR: 1.7

## 2013-05-18 ENCOUNTER — Other Ambulatory Visit: Payer: Self-pay | Admitting: Cardiovascular Disease

## 2013-05-26 ENCOUNTER — Ambulatory Visit (INDEPENDENT_AMBULATORY_CARE_PROVIDER_SITE_OTHER): Payer: Medicare Other

## 2013-05-26 DIAGNOSIS — Z7901 Long term (current) use of anticoagulants: Secondary | ICD-10-CM

## 2013-05-26 DIAGNOSIS — I4891 Unspecified atrial fibrillation: Secondary | ICD-10-CM

## 2013-05-26 LAB — POCT INR: INR: 1.7

## 2013-05-29 ENCOUNTER — Encounter: Payer: Self-pay | Admitting: *Deleted

## 2013-06-16 ENCOUNTER — Ambulatory Visit (INDEPENDENT_AMBULATORY_CARE_PROVIDER_SITE_OTHER): Payer: Medicare Other | Admitting: Pharmacist

## 2013-06-16 DIAGNOSIS — Z7901 Long term (current) use of anticoagulants: Secondary | ICD-10-CM

## 2013-06-16 DIAGNOSIS — I4891 Unspecified atrial fibrillation: Secondary | ICD-10-CM

## 2013-06-16 LAB — POCT INR: INR: 3.3

## 2013-07-07 ENCOUNTER — Ambulatory Visit (INDEPENDENT_AMBULATORY_CARE_PROVIDER_SITE_OTHER): Payer: Medicare Other

## 2013-07-07 DIAGNOSIS — Z7901 Long term (current) use of anticoagulants: Secondary | ICD-10-CM

## 2013-07-07 DIAGNOSIS — I4891 Unspecified atrial fibrillation: Secondary | ICD-10-CM

## 2013-07-07 LAB — POCT INR: INR: 2.1

## 2013-07-31 ENCOUNTER — Ambulatory Visit (INDEPENDENT_AMBULATORY_CARE_PROVIDER_SITE_OTHER): Payer: Medicare Other | Admitting: Internal Medicine

## 2013-07-31 ENCOUNTER — Ambulatory Visit (INDEPENDENT_AMBULATORY_CARE_PROVIDER_SITE_OTHER): Payer: Medicare Other | Admitting: Pharmacist

## 2013-07-31 ENCOUNTER — Encounter: Payer: Self-pay | Admitting: Internal Medicine

## 2013-07-31 VITALS — BP 103/67 | HR 65 | Ht 66.0 in | Wt 196.8 lb

## 2013-07-31 DIAGNOSIS — Z7901 Long term (current) use of anticoagulants: Secondary | ICD-10-CM

## 2013-07-31 DIAGNOSIS — I480 Paroxysmal atrial fibrillation: Secondary | ICD-10-CM

## 2013-07-31 DIAGNOSIS — I4891 Unspecified atrial fibrillation: Secondary | ICD-10-CM

## 2013-07-31 DIAGNOSIS — Z950 Presence of cardiac pacemaker: Secondary | ICD-10-CM

## 2013-07-31 DIAGNOSIS — I495 Sick sinus syndrome: Secondary | ICD-10-CM

## 2013-07-31 DIAGNOSIS — I2510 Atherosclerotic heart disease of native coronary artery without angina pectoris: Secondary | ICD-10-CM

## 2013-07-31 LAB — MDC_IDC_ENUM_SESS_TYPE_INCLINIC
Battery Remaining Longevity: 129.6 mo
Brady Statistic RV Percent Paced: 43 %
Lead Channel Impedance Value: 462.5 Ohm
Lead Channel Impedance Value: 512.5 Ohm
Lead Channel Pacing Threshold Pulse Width: 0.4 ms
Lead Channel Sensing Intrinsic Amplitude: 11.3 mV
Lead Channel Sensing Intrinsic Amplitude: 2.6 mV
Lead Channel Setting Pacing Amplitude: 1.5 V
Lead Channel Setting Pacing Pulse Width: 0.4 ms
Lead Channel Setting Sensing Sensitivity: 2 mV
MDC IDC MSMT BATTERY VOLTAGE: 2.96 V
MDC IDC MSMT LEADCHNL RA PACING THRESHOLD AMPLITUDE: 0.5 V
MDC IDC MSMT LEADCHNL RA PACING THRESHOLD PULSEWIDTH: 0.4 ms
MDC IDC MSMT LEADCHNL RV PACING THRESHOLD AMPLITUDE: 0.75 V
MDC IDC PG SERIAL: 7322329
MDC IDC SESS DTM: 20150702123946
MDC IDC SET LEADCHNL RV PACING AMPLITUDE: 1 V
MDC IDC STAT BRADY RA PERCENT PACED: 92 %

## 2013-07-31 LAB — MAGNESIUM: Magnesium: 2.1 mg/dL (ref 1.5–2.5)

## 2013-07-31 LAB — BASIC METABOLIC PANEL
BUN: 31 mg/dL — ABNORMAL HIGH (ref 6–23)
CALCIUM: 9.1 mg/dL (ref 8.4–10.5)
CHLORIDE: 103 meq/L (ref 96–112)
CO2: 26 meq/L (ref 19–32)
Creatinine, Ser: 1.2 mg/dL (ref 0.4–1.5)
GFR: 66.52 mL/min (ref >60.00)
Glucose, Bld: 87 mg/dL (ref 70–99)
Potassium: 4.8 mEq/L (ref 3.5–5.1)
SODIUM: 135 meq/L (ref 135–145)

## 2013-07-31 LAB — POCT INR: INR: 2.7

## 2013-07-31 MED ORDER — CARVEDILOL 6.25 MG PO TABS
6.2500 mg | ORAL_TABLET | Freq: Two times a day (BID) | ORAL | Status: DC
Start: 2013-07-31 — End: 2013-09-04

## 2013-07-31 NOTE — Patient Instructions (Addendum)
Your physician has recommended you make the following change in your medication:  1) DECREASE Carvedilol to 6.25 mg twice daily  Your physician has requested that you have a lexiscan myoview. For further information please visit [email or web address]. Please follow instruction sheet, as given.  Labs today: BMET, Magnesium  Remote monitoring is used to monitor your pacemaker from home. This monitoring reduces the number of office visits required to check your device to one time per year. It allows [place] to keep an eye on the functioning of your device to ensure it is working properly. You are scheduled for a device check from home on 11-03-2013. You may send your transmission at any time that day. If you have a wireless device, the transmission will be sent automatically. After your physician reviews your transmission, you will receive a postcard with your next transmission date.  Your physician recommends that you schedule a follow-up appointment in: 12 months with Dr.Klein

## 2013-07-31 NOTE — Progress Notes (Signed)
Patient Care Team: Jeannine Boga as PCP - General (Family Medicine)   HPI  Dylan Roth is a 73 y.o. male Seen in followup for atrial fibrillation with  posttermination pauses for which he underwent pacemaker implantation 4/13. He has been treated with sotalol and Rivaroxaban  He has infrequent palpitations.   No recurrent orthostatic symptoms except while bending over. He also notes however when rushing up a flight of stairs some lightheadedness and some shortness of breath.  He has had problems recently with a chest discomfort. He describes his shoulder aching and sharp jabbing pains that are very brief period these are reminiscent of the discomfort he had prior to his bypass surgery.   He has a history of ischemic heart disease with prior bypass surgery 2005. Ejection fraction by echo 9/14 was normal    Past Medical History  Diagnosis Date  . Atrial fibrillation/flutter   . 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 Sinus node dysfunction     post termination pauses <5sec  . Hyperlipidemia   . Hypertension   . Central obesity   . Gout     "once; I have it under control w/medicine"  . BPH (benign prostatic hyperplasia)   . Gall stones   . Chronic back pain   . Chronic shoulder pain   . Angina   . Arthritis     "in my back"    Past Surgical History  Procedure Laterality Date  . Coronary artery bypass graft  2005    in [place] - LIMA to LAD, SVG to diagonal, SVG to ramus, SVG to OM and SVG to RCA   . Cholecystectomy  1978   . Cardiac catheterization  2005    "that what sent me to CABG"  . Cataract extraction w/ intraocular lens  implant, bilateral  ~ 01/2011    Current Outpatient Prescriptions  Medication Sig Dispense Refill  . allopurinol (ZYLOPRIM) 300 MG tablet Take 300 mg by mouth daily.        Marland Kitchen atorvastatin (LIPITOR) 40 MG tablet TAKE ONE TABLET BY  MOUTH EVERY DAY  30 tablet  12  . carvedilol (COREG) 12.5 MG tablet TAKE ONE TABLET BY MOUTH TWICE DAILY  60 tablet  6  . finasteride (PROSCAR) 5 MG tablet Take 5 mg by mouth daily.       Marland Kitchen lisinopril (PRINIVIL,ZESTRIL) 20 MG tablet Take 20 mg by mouth at bedtime.       . nitroGLYCERIN (NITROSTAT) 0.4 MG SL tablet Place 1 tablet (0.4 mg total) under the tongue every 5 (five) minutes as needed. For chest pain  25 tablet  3  . Omega-3 Fatty Acids (FISH OIL) 1000 MG CAPS Take 1 capsule by mouth daily.       . sildenafil (VIAGRA) 50 MG tablet Take 1 tablet (50 mg total) by mouth daily as needed. For erectile dysfunction      . sotalol (BETAPACE) 120 MG tablet TAKE ONE TABLET BY MOUTH EVERY 12 HOURS  180 tablet  1  . Tamsulosin HCl (FLOMAX) 0.4 MG CAPS Take 0.4 mg by mouth 2 (two) times daily.       Marland Kitchen warfarin (COUMADIN) 5 MG tablet TAKE AS DIRECTED BY ANTICOAGULATION CLINIC.  30 tablet  3   No current facility-administered medications for this visit.    Allergies  Allergen Reactions  .  Sulfur     SWELLING ON PATIENTS LIPS, HE GETS DRIED UP    Review of Systems negative except from HPI and PMH  Physical Exam BP 103/67  Pulse 65  Ht 5\' 6"  (1.676 m)  Wt 196 lb 12.8 oz (89.268 kg)  BMI 31.78 kg/m2 Well developed and nourished in no acute distress HENT normal Neck supple with JVP-flat Clear Regular rate and rhythm, no murmurs or gallops Abd-soft with active BS No Clubbing cyanosis edema Skin-warm and dry A & Oriented  Grossly normal sensory and motor function  ECG: AV pacing at 65            Intervals  22/09/41  Axis 41     Assessment and  Plan  Ischemic heart disease with prior bypass surgery  Exertional chest discomfort/dyspnea-atypical  Sinus node dysfunction and chronotropic incompetence  Pacemaker-St. Jude  Lightheadedness  Adhesive capsulitis   We'll plan to undertake Myoview scanning given his chest discomfort. While the discomfort is atypical, I am concerned as  it is similar to his pre-bypass discomfort.  Furthermore, it would be important to know that there is no interval significant obstruction prior to  reprogramming his upper sensor rate were appropriately--140  For now we had increased his slope some.   Given his modest amount of lightheadedness, we will decrease his a.m. carvedilol 12.5--6.25. Other medications will be kept the same.   I've encouraged him to consider physical therapy for his adhesive capsulitis.

## 2013-08-08 ENCOUNTER — Encounter: Payer: Self-pay | Admitting: Internal Medicine

## 2013-08-12 ENCOUNTER — Ambulatory Visit (HOSPITAL_COMMUNITY): Payer: Medicare Other | Attending: Cardiology | Admitting: Radiology

## 2013-08-12 VITALS — BP 139/87 | Ht 66.0 in | Wt 196.0 lb

## 2013-08-12 DIAGNOSIS — I480 Paroxysmal atrial fibrillation: Secondary | ICD-10-CM

## 2013-08-12 DIAGNOSIS — I4891 Unspecified atrial fibrillation: Secondary | ICD-10-CM | POA: Diagnosis not present

## 2013-08-12 DIAGNOSIS — R079 Chest pain, unspecified: Secondary | ICD-10-CM

## 2013-08-12 DIAGNOSIS — I495 Sick sinus syndrome: Secondary | ICD-10-CM | POA: Diagnosis present

## 2013-08-12 MED ORDER — ADENOSINE (DIAGNOSTIC) 3 MG/ML IV SOLN
0.5600 mg/kg | Freq: Once | INTRAVENOUS | Status: AC
  Administered 2013-08-12: 49.8 mg via INTRAVENOUS

## 2013-08-12 MED ORDER — TECHNETIUM TC 99M SESTAMIBI GENERIC - CARDIOLITE
10.0000 | Freq: Once | INTRAVENOUS | Status: AC | PRN
  Administered 2013-08-12: 10 via INTRAVENOUS

## 2013-08-12 MED ORDER — TECHNETIUM TC 99M SESTAMIBI GENERIC - CARDIOLITE
30.0000 | Freq: Once | INTRAVENOUS | Status: AC | PRN
  Administered 2013-08-12: 30 via INTRAVENOUS

## 2013-08-12 NOTE — Progress Notes (Signed)
[facility] [address] [place], [place] [address] [phone]    Cardiology Nuclear Med Study  Dylan Roth is a 73 y.o. male     MRN : [id number]     DOB: May 12, 1940  Procedure Date: 08/12/2013  Nuclear Med Background Indication for Stress Test:  Evaluation for Ischemia and Graft Patency History:  CAD-MI-CATH-'05 CABG-PTVP-AV PACED AFIB/AFLUTTER, '11 MPI: NL EF: 69% '14 ECHO: EF: 55-60% Cardiac Risk Factors: Hypertension and Lipids  Symptoms:  Chest Pain, Dizziness and Palpitations   Nuclear Pre-Procedure Caffeine/Decaff Intake:  None NPO After: 10:00pm   Lungs:  clear O2 Sat: 96% on room air. IV 0.9% NS with Angio Cath:  22g  IV Site: R Hand  IV Started by:  Matilde Haymaker, RN  Chest Size (in):  44 Cup Size: n/a  Height: 5\' 6"  (1.676 m)  Weight:  196 lb (88.905 kg)  BMI:  Body mass index is 31.65 kg/(m^2). Tech Comments:  Coreg taken at 0800 today    Nuclear Med Study 1 or 2 day study: 1 day  Stress Test Type:  Adenosine  Reading MD: n/a  Order Authorizing Provider:  Herbert Pun  Resting Radionuclide: Technetium 36m Sestamibi  Resting Radionuclide Dose: 11.0 mCi   Stress Radionuclide:  Technetium 80m Sestamibi  Stress Radionuclide Dose: 33.0 mCi           Stress Protocol Rest HR: 60 Stress HR: 61  Rest BP: 139/87 Stress BP: 146/86  Exercise Time (min): n/a METS: n/a   Predicted Max HR: 149 bpm % Max HR: 40.94 bpm Rate Pressure Product: 8906   Dose of Adenosine (mg):  49.9 Dose of Lexiscan: n/a mg  Dose of Atropine (mg): n/a Dose of Dobutamine: n/a mcg/kg/min (at max HR)  Stress Test Technologist: Perrin Maltese, EMT-P  Nuclear Technologist:  Vedia Pereyra, CNMT     Rest Procedure:  Myocardial perfusion imaging was performed at rest 45 minutes following the intravenous administration of Technetium 39m Sestamibi. Rest ECG: NSR - Normal EKG  Stress Procedure:  The patient received IV adenosine at 140 mcg/kg/min  for 4 minutes.  Technetium 54m Sestamibi was injected at the 2 minute mark and quantitative spect images were obtained after a 45 minute delay. Stress ECG: intermittent v-pacing was noted  QPS Raw Data Images:  Mild diaphragmatic attenuation.  Normal left ventricular size. Stress Images:  There is decreased uptake in the inferior wall. Rest Images:  There is decreased uptake in the inferior wall. Subtraction (SDS):  No evidence of ischemia. Transient Ischemic Dilatation (Normal <1.22):  1.08 Lung/Heart Ratio (Normal <0.45):  0.32  Quantitative Gated Spect Images QGS EDV:  82 ml QGS ESV:  38 ml  Impression Exercise Capacity:  Adenosine study with no exercise. BP Response:  Hypotensive blood pressure response. Clinical Symptoms:  No significant symptoms noted. ECG Impression:  Non-diagnostic, intermittent v-pacing Comparison with Prior Nuclear Study: No significant change from previous study  Overall Impression:  Low risk stress nuclear study with inferior bowel attenuation artifact. No ischemia.  LV Ejection Fraction: 53%.  LV Wall Motion:  NL LV Function; NL Wall Motion  Pixie Casino, MD, [facility] Board Certified in Nuclear Cardiology Attending Cardiologist [place]

## 2013-09-01 ENCOUNTER — Encounter: Payer: Self-pay | Admitting: Internal Medicine

## 2013-09-01 NOTE — Telephone Encounter (Signed)
This encounter was created in error - please disregard.

## 2013-09-01 NOTE — Telephone Encounter (Signed)
New message  Pt called back to discuss stress test results please call

## 2013-09-04 ENCOUNTER — Ambulatory Visit (INDEPENDENT_AMBULATORY_CARE_PROVIDER_SITE_OTHER): Payer: Medicare Other | Admitting: Pharmacist

## 2013-09-04 ENCOUNTER — Ambulatory Visit (INDEPENDENT_AMBULATORY_CARE_PROVIDER_SITE_OTHER): Payer: Medicare Other | Admitting: Cardiovascular Disease

## 2013-09-04 ENCOUNTER — Encounter: Payer: Self-pay | Admitting: Cardiovascular Disease

## 2013-09-04 VITALS — BP 110/70 | HR 60 | Ht 66.0 in | Wt 201.0 lb

## 2013-09-04 DIAGNOSIS — I2510 Atherosclerotic heart disease of native coronary artery without angina pectoris: Secondary | ICD-10-CM

## 2013-09-04 DIAGNOSIS — I480 Paroxysmal atrial fibrillation: Secondary | ICD-10-CM

## 2013-09-04 DIAGNOSIS — I4891 Unspecified atrial fibrillation: Secondary | ICD-10-CM

## 2013-09-04 DIAGNOSIS — I10 Essential (primary) hypertension: Secondary | ICD-10-CM

## 2013-09-04 DIAGNOSIS — Z7901 Long term (current) use of anticoagulants: Secondary | ICD-10-CM

## 2013-09-04 LAB — POCT INR: INR: 3.2

## 2013-09-04 MED ORDER — CARVEDILOL 3.125 MG PO TABS: 3.1250 mg | ORAL_TABLET | Freq: Two times a day (BID) | ORAL | Status: DC

## 2013-09-04 NOTE — Progress Notes (Signed)
History of Present Illness: 73 yo WM with history of CAD s/p 5V CABG 2005, atrial fibrillation, HTN, HLD, hyperkalemia, obesity, chronic back and shoulder pain who is here today for cardiac follow up. He has been evaluated in the past for palpitations. Echo September 2010 showed EF 60-65% mild AI/TR. Stress Myoview 6/11 EF normal. Normal perfusion. I saw him 01/10/11 for the first time. His EKG showed atrial flutter. I started Cardizem CD 120 mg po Qdaily and xarelto. I had him wear a monitor for 48 hours. He had sinus rhythm with episodes of atrial flutter. He also had 4 second pauses. His cardizem was held and Coreg was lowered. He saw Dr. Caryl Roth in April 2013 and was started on dofetilide for atrial fibrillation. He had post-termination pauses and had a pacemaker implanted in April 2013. He has since had Tikosyn stopped due to cost and was started on sotalol. He was switched from Xarelto to coumadin. This was all based on cost. He last saw Dr. Caryl Roth July 2015. Echo 10/29/12 with LVEF=55-60%, mild LVH, no significant valve disease. I saw him in February 2015 and he had chest pain c/w musculoskeletal etiology, clearly worse with movement. He was seen by Dr. Caryl Roth 07/31/13 and c/o lightheadedness. Coreg lowered to 6.25 mg po BID. Also c/o chest discomfort. Stress myoview 09/01/13 with no ischemia.   He is here today for follow up. He tells me that he has stable pain in left shoulder after he works in the yard. Clearly worse with movement. No SOB. No palpitations, dizziness, near syncope or syncope. Overall feeling well. He has been taking Coreg 6.25 mg po BID and was taking that dose when seen by Dr. Caryl Roth on 07/31/13. Dr. Caryl Roth wanted to reduce the dose of Coreg to be reduced.   Primary Care Physician: Dylan Roth  Last Lipid Profile: Followed in primary care  Past Medical History  Diagnosis Date  . Atrial fibrillation/flutter   . CAD (coronary artery disease) Dec 2005    Hx MI. 80% left main, 80% LAD,  90% ramus, 50% circ, 90% in nondominant RCA,  EF normal  2010--echo  . Sinus node dysfunction     post termination pauses <5sec  . Hyperlipidemia   . Hypertension   . Central obesity   . Gout     "once; I have it under control w/medicine"  . BPH (benign prostatic hyperplasia)   . Gall stones   . Chronic back pain   . Chronic shoulder pain   . Angina   . Arthritis     "in my back"    Past Surgical History  Procedure Laterality Date  . Coronary artery bypass graft  2005    in [place] - LIMA to LAD, SVG to diagonal, SVG to ramus, SVG to OM and SVG to RCA   . Cholecystectomy  1978   . Cardiac catheterization  2005    "that what sent me to CABG"  . Cataract extraction w/ intraocular lens  implant, bilateral  ~ 01/2011    Current Outpatient Prescriptions  Medication Sig Dispense Refill  . allopurinol (ZYLOPRIM) 300 MG tablet Take 300 mg by mouth daily.        Marland Kitchen atorvastatin (LIPITOR) 40 MG tablet TAKE ONE TABLET BY MOUTH EVERY DAY  30 tablet  12  . carvedilol (COREG) 6.25 MG tablet Take 1 tablet (6.25 mg total) by mouth 2 (two) times daily with a meal.  30 tablet  5  . cholestyramine (QUESTRAN) 4  G packet       . finasteride (PROSCAR) 5 MG tablet Take 5 mg by mouth daily.       Marland Kitchen lisinopril (PRINIVIL,ZESTRIL) 20 MG tablet Take 20 mg by mouth at bedtime.       . nitroGLYCERIN (NITROSTAT) 0.4 MG SL tablet Place 1 tablet (0.4 mg total) under the tongue every 5 (five) minutes as needed. For chest pain  25 tablet  3  . Omega-3 Fatty Acids (FISH OIL) 1000 MG CAPS Take 1 capsule by mouth daily.       . sildenafil (VIAGRA) 50 MG tablet Take 1 tablet (50 mg total) by mouth daily as needed. For erectile dysfunction      . sotalol (BETAPACE) 120 MG tablet TAKE ONE TABLET BY MOUTH EVERY 12 HOURS  180 tablet  1  . Tamsulosin HCl (FLOMAX) 0.4 MG CAPS Take 0.4 mg by mouth 2 (two) times daily.       Marland Kitchen warfarin (COUMADIN) 5 MG tablet TAKE AS DIRECTED BY ANTICOAGULATION CLINIC.  30 tablet  3    No current facility-administered medications for this visit.    Allergies  Allergen Reactions  . Sulfur     SWELLING ON PATIENTS LIPS, HE GETS DRIED UP    History   Social History  . Marital Status: Married    Spouse Name: N/A    Number of Children: 2  . Years of Education: N/A   Occupational History  . retired-truck driver   .     Social History Main Topics  . Smoking status: Former Smoker -- 0.50 packs/day for 5 years    Types: Cigarettes    Quit date: 01/30/1961  . Smokeless tobacco: Never Used  . Alcohol Use: No     Comment: 2 drinks (bourbon) a night  . Drug Use: No  . Sexual Activity: Yes   Other Topics Concern  . Not on file   Social History Narrative   Married. Pt works part-time for a [employer]. Quit tobacco 50 years ago..Both parents are deceased, neither one had cardiac issues and no siblings have cardiac issues.     Family History  Problem Relation Age of Onset  . Cancer Mother     Review of Systems:  As stated in the HPI and otherwise negative.   BP 110/70  Pulse 60  Ht 5\' 6"  (1.676 m)  Wt 201 lb (91.173 kg)  BMI 32.46 kg/m2  Physical Examination: General: Well developed, well nourished, NAD HEENT: OP clear, mucus membranes moist SKIN: warm, dry. No rashes. Neuro: No focal deficits Musculoskeletal: Muscle strength 5/5 all ext Psychiatric: Mood and affect normal Neck: No JVD, no carotid bruits, no thyromegaly, no lymphadenopathy. Lungs:Clear bilaterally, no wheezes, rhonci, crackles Cardiovascular: Regular rate and rhythm. No murmurs, gallops or rubs. Abdomen:Soft. Bowel sounds present. Non-tender.  Extremities: No lower extremity edema. Pulses are 2 + in the bilateral DP/PT.  Echo 09/3012: Left ventricle: The cavity size was normal. Wall thickness was increased in a pattern of mild LVH. Systolic function was normal. The estimated ejection fraction was in the range of 55% to 60%. Wall motion was normal; there were no regional  wall motion abnormalities. Doppler parameters are consistent with abnormal left ventricular relaxation (grade 1 diastolic dysfunction).  Stress myoview 09/01/13: Stress Procedure: The patient received IV adenosine at 140 mcg/kg/min for 4 minutes. Technetium 37m Sestamibi was injected at the 2 minute mark and quantitative spect images were obtained after a 45 minute delay.  Stress ECG: intermittent  v-pacing was noted  QPS  Raw Data Images: Mild diaphragmatic attenuation. Normal left ventricular size.  Stress Images: There is decreased uptake in the inferior wall.  Rest Images: There is decreased uptake in the inferior wall.  Subtraction (SDS): No evidence of ischemia.  Transient Ischemic Dilatation (Normal <1.22): 1.08  Lung/Heart Ratio (Normal <0.45): 0.32  Quantitative Gated Spect Images  QGS EDV: 82 ml  QGS ESV: 38 ml  Impression  Exercise Capacity: Adenosine study with no exercise.  BP Response: Hypotensive blood pressure response.  Clinical Symptoms: No significant symptoms noted.  ECG Impression: Non-diagnostic, intermittent v-pacing  Comparison with Prior Nuclear Study: No significant change from previous study  Overall Impression: Low risk stress nuclear study with inferior bowel attenuation artifact. No ischemia.  LV Ejection Fraction: 53%. LV Wall Motion: NL LV Function; NL Wall Motion   Assessment and Plan:   1. CAD: Stable. His chest pain seems to be musculoskeletal. Stress test this week does not show ischemia. He is on good medical therapy including statin, beta blocker. He is not on an ASA secondary to his need for coumadin. Lipids followed in primary care office.   2. Atrial fibrillation: He is on sotalol and coumadin per Dr. Caryl Roth. Pacemaker in place.   3. HTN: BP controlled.

## 2013-09-04 NOTE — Patient Instructions (Signed)
Your physician wants you to follow-up in: 6 months.  You will receive a reminder letter in the mail two months in advance. If you don't receive a letter, please call our office to schedule the follow-up appointment.  Your physician has recommended you make the following change in your medication:  Decrease Carvedilol to 3.125 mg by mouth twice daily

## 2013-09-23 ENCOUNTER — Other Ambulatory Visit: Payer: Self-pay | Admitting: Cardiovascular Disease

## 2013-10-01 ENCOUNTER — Ambulatory Visit (INDEPENDENT_AMBULATORY_CARE_PROVIDER_SITE_OTHER): Payer: Medicare Other | Admitting: Pharmacist

## 2013-10-01 DIAGNOSIS — I4891 Unspecified atrial fibrillation: Secondary | ICD-10-CM

## 2013-10-01 DIAGNOSIS — Z7901 Long term (current) use of anticoagulants: Secondary | ICD-10-CM

## 2013-10-01 LAB — POCT INR: INR: 2.4

## 2013-10-29 ENCOUNTER — Ambulatory Visit (INDEPENDENT_AMBULATORY_CARE_PROVIDER_SITE_OTHER): Payer: Medicare Other | Admitting: Pharmacist

## 2013-10-29 DIAGNOSIS — Z7901 Long term (current) use of anticoagulants: Secondary | ICD-10-CM

## 2013-10-29 DIAGNOSIS — I4891 Unspecified atrial fibrillation: Secondary | ICD-10-CM

## 2013-10-29 LAB — POCT INR: INR: 2.4

## 2013-10-31 ENCOUNTER — Other Ambulatory Visit: Payer: Self-pay | Admitting: Cardiovascular Disease

## 2013-11-03 ENCOUNTER — Ambulatory Visit (INDEPENDENT_AMBULATORY_CARE_PROVIDER_SITE_OTHER): Payer: Medicare Other | Admitting: *Deleted

## 2013-11-03 ENCOUNTER — Telehealth: Payer: Self-pay | Admitting: Cardiology

## 2013-11-03 ENCOUNTER — Encounter: Payer: Self-pay | Admitting: Internal Medicine

## 2013-11-03 DIAGNOSIS — I495 Sick sinus syndrome: Secondary | ICD-10-CM

## 2013-11-03 NOTE — Telephone Encounter (Signed)
LMOVM reminding pt to send remote transmission.   

## 2013-11-05 LAB — MDC_IDC_ENUM_SESS_TYPE_REMOTE
Battery Remaining Longevity: 100 mo
Battery Remaining Percentage: 75 %
Brady Statistic AP VP Percent: 61 %
Brady Statistic AP VS Percent: 35 %
Brady Statistic AS VP Percent: 1 %
Brady Statistic AS VS Percent: 3.4 %
Brady Statistic RA Percent Paced: 96 %
Brady Statistic RV Percent Paced: 62 %
Date Time Interrogation Session: 20151005223847
Implantable Pulse Generator Serial Number: 7322329
Lead Channel Impedance Value: 450 Ohm
Lead Channel Impedance Value: 480 Ohm
Lead Channel Pacing Threshold Amplitude: 0.5 V
Lead Channel Pacing Threshold Amplitude: 0.75 V
Lead Channel Pacing Threshold Pulse Width: 0.4 ms
Lead Channel Sensing Intrinsic Amplitude: 10.4 mV
Lead Channel Sensing Intrinsic Amplitude: 2.9 mV
Lead Channel Setting Pacing Amplitude: 1 V
Lead Channel Setting Pacing Amplitude: 1.5 V
Lead Channel Setting Pacing Pulse Width: 0.4 ms
Lead Channel Setting Sensing Sensitivity: 2 mV
MDC IDC MSMT BATTERY VOLTAGE: 2.95 V
MDC IDC MSMT LEADCHNL RA PACING THRESHOLD PULSEWIDTH: 0.4 ms

## 2013-11-05 NOTE — Progress Notes (Signed)
Remote pacemaker transmission.   

## 2013-11-23 ENCOUNTER — Other Ambulatory Visit: Payer: Self-pay | Admitting: Cardiovascular Disease

## 2013-12-10 ENCOUNTER — Ambulatory Visit (INDEPENDENT_AMBULATORY_CARE_PROVIDER_SITE_OTHER): Payer: Medicare Other | Admitting: *Deleted

## 2013-12-10 DIAGNOSIS — I4891 Unspecified atrial fibrillation: Secondary | ICD-10-CM

## 2013-12-10 DIAGNOSIS — Z7901 Long term (current) use of anticoagulants: Secondary | ICD-10-CM

## 2013-12-10 LAB — POCT INR: INR: 2.7

## 2013-12-18 ENCOUNTER — Other Ambulatory Visit: Payer: Self-pay | Admitting: Urology

## 2013-12-19 NOTE — Progress Notes (Signed)
Please put orders in Epic surgery 12-29-13 pre op 12-23-13 Thanks

## 2013-12-22 ENCOUNTER — Telehealth: Payer: Self-pay | Admitting: Cardiovascular Disease

## 2013-12-22 ENCOUNTER — Other Ambulatory Visit (HOSPITAL_COMMUNITY): Payer: Self-pay | Admitting: *Deleted

## 2013-12-22 NOTE — Telephone Encounter (Signed)
Received request from Nurse fax box, documents faxed for surgical clearance. °To: [facility] °Fax number: [phone] °Attention: °[phone]/km °

## 2013-12-22 NOTE — Patient Instructions (Addendum)
Dylan Roth  12/22/2013                           YOUR PROCEDURE IS SCHEDULED ON:  12/29/13                ENTER FROM [address] - GO TO PARKING DECK               LOOK FOR [facility]  / [employer]                              FOLLOW  SIGNS TO SHORT STAY CENTER                 ARRIVE AT SHORT STAY AT:  7:00 AM               CALL THIS NUMBER IF ANY PROBLEMS THE DAY OF SURGERY :               [phone]                                REMEMBER:   Do not eat food or drink liquids AFTER MIDNIGHT                  Take these medicines the morning of surgery with               A SIPS OF WATER :    ALLOPURINOL / CARVEDILOL / SOTALOL / TAMSULOSIN     Do not wear jewelry, make-up   Do not wear lotions, powders, or perfumes.   Do not shave legs or underarms 12 hrs. before surgery (men may shave face)  Do not bring valuables to the hospital.  Contacts, dentures or bridgework may not be worn into surgery.  Leave suitcase in the car. After surgery it may be brought to your room.  For patients admitted to the hospital more than one night, checkout time is            11:00 AM                                                       The day of discharge.   Patients discharged the day of surgery will not be allowed to drive home.            If going home same day of surgery, must have someone stay with you              FIRST 24 hrs at home and arrange for some one to drive you              home from hospital.   ________________________________________________________________________  [place]  Before surgery, you can play an important role.  Because skin is not sterile, your skin needs to be as free of germs as possible.  You can reduce the number of germs on your skin by washing with CHG (chlorahexidine gluconate) soap before surgery.  CHG is an  antiseptic cleaner which kills germs and bonds with the skin to continue killing germs even after washing. Please DO NOT use if you have an allergy to CHG or antibacterial soaps.  If your skin becomes reddened/irritated stop using the CHG and inform your nurse when you arrive at Short Stay. Do not shave (including legs and underarms) for at least 48 hours prior to the first CHG shower.  You may shave your face. Please follow these instructions carefully:   1.  Shower with CHG Soap the night before surgery and the  morning of Surgery.   2.  If you choose to wash your hair, wash your hair first as usual with your  normal  Shampoo.   3.  After you shampoo, rinse your hair and body thoroughly to remove the  shampoo.                                         4.  Use CHG as you would any other liquid soap.  You can apply chg directly  to the skin and wash . Gently wash with scrungie or clean wascloth    5.  Apply the CHG Soap to your body ONLY FROM THE NECK DOWN.   Do not use on open                           Wound or open sores. Avoid contact with eyes, ears mouth and genitals (private parts).                        Genitals (private parts) with your normal soap.              6.  Wash thoroughly, paying special attention to the area where your surgery  will be performed.   7.  Thoroughly rinse your body with warm water from the neck down.   8.  DO NOT shower/wash with your normal soap after using and rinsing off  the CHG Soap .                9.  Pat yourself dry with a clean towel.             10.  Wear clean pajamas.             11.  Place clean sheets on your bed the night of your first shower and do not  sleep with pets.  Day of Surgery : Do not apply any lotions/deodorants the morning of surgery.  Please wear clean clothes to the hospital/surgery center.  FAILURE TO FOLLOW THESE INSTRUCTIONS MAY RESULT IN THE CANCELLATION OF YOUR SURGERY    PATIENT  SIGNATURE_________________________________  ______________________________________________________________________

## 2013-12-23 ENCOUNTER — Encounter (HOSPITAL_COMMUNITY): Payer: Self-pay

## 2013-12-23 ENCOUNTER — Ambulatory Visit (HOSPITAL_COMMUNITY)
Admission: RE | Admit: 2013-12-23 | Discharge: 2013-12-23 | Disposition: A | Discharge status: Home / Self Care | Payer: Medicare Other | Source: Ambulatory Visit | Attending: Anesthesiology | Admitting: Anesthesiology

## 2013-12-23 ENCOUNTER — Encounter (HOSPITAL_COMMUNITY)
Admission: RE | Admit: 2013-12-23 | Discharge: 2013-12-23 | Disposition: A | Discharge status: Home / Self Care | Payer: Medicare Other | Source: Ambulatory Visit | Attending: Urology | Admitting: Urology

## 2013-12-23 DIAGNOSIS — Z951 Presence of aortocoronary bypass graft: Secondary | ICD-10-CM | POA: Diagnosis not present

## 2013-12-23 DIAGNOSIS — R079 Chest pain, unspecified: Secondary | ICD-10-CM | POA: Diagnosis not present

## 2013-12-23 DIAGNOSIS — I517 Cardiomegaly: Secondary | ICD-10-CM | POA: Insufficient documentation

## 2013-12-23 DIAGNOSIS — Z01818 Encounter for other preprocedural examination: Secondary | ICD-10-CM | POA: Diagnosis present

## 2013-12-23 DIAGNOSIS — Z950 Presence of cardiac pacemaker: Secondary | ICD-10-CM | POA: Diagnosis not present

## 2013-12-23 DIAGNOSIS — I10 Essential (primary) hypertension: Secondary | ICD-10-CM

## 2013-12-23 HISTORY — DX: Presence of cardiac pacemaker: Z95.0

## 2013-12-23 LAB — CBC
HCT: 45.7 % (ref 39.0–52.0)
Hemoglobin: 15.4 g/dL (ref 13.0–17.0)
MCH: 32.7 pg (ref 26.0–34.0)
MCHC: 33.7 g/dL (ref 30.0–36.0)
MCV: 97 fL (ref 78.0–100.0)
Platelets: 180 10*3/uL (ref 150–400)
RBC: 4.71 MIL/uL (ref 4.22–5.81)
RDW: 13.6 % (ref 11.5–15.5)
WBC: 7.5 10*3/uL (ref 4.0–10.5)

## 2013-12-23 LAB — BASIC METABOLIC PANEL
ANION GAP: 12 (ref 5–15)
BUN: 15 mg/dL (ref 6–23)
CALCIUM: 9.5 mg/dL (ref 8.4–10.5)
CHLORIDE: 100 meq/L (ref 96–112)
CO2: 27 mEq/L (ref 19–32)
CREATININE: 0.89 mg/dL (ref 0.50–1.35)
GFR calc non Af Amer: 84 mL/min — ABNORMAL LOW (ref >90)
Glucose, Bld: 80 mg/dL (ref 70–99)
Potassium: 5.1 mEq/L (ref 3.7–5.3)
Sodium: 139 mEq/L (ref 137–147)

## 2013-12-23 LAB — PROTIME-INR
INR: 1.93 — ABNORMAL HIGH (ref 0.00–1.49)
PROTHROMBIN TIME: 22.2 s — AB (ref 11.6–15.2)

## 2013-12-23 LAB — APTT: APTT: 46 s — AB (ref 24–37)

## 2013-12-23 NOTE — Progress Notes (Signed)
   12/23/13 1342  OBSTRUCTIVE SLEEP APNEA  Have you ever been diagnosed with sleep apnea through a sleep study? No  Do you snore loudly (loud enough to be heard through closed doors)?  0  Do you often feel tired, fatigued, or sleepy during the daytime? 0  Has anyone observed you stop breathing during your sleep? 0  Do you have, or are you being treated for high blood pressure? 1  BMI more than 35 kg/m2? 0  Age over 72 years old? 1  Neck circumference greater than 40 cm/16 inches? 1  Gender: 1  Obstructive Sleep Apnea Score 4  Score 4 or greater  Results sent to PCP

## 2013-12-23 NOTE — Progress Notes (Signed)
PT/INR and PTT  Abnormal at preop appointment on 12/23/2013.  PT/INR to be drawn am of surgery.

## 2013-12-23 NOTE — Progress Notes (Signed)
Pt/INR and PTT doen 12/23/2013 faxed via EPIC to Dr Risa Grill via EPIC.

## 2013-12-28 NOTE — Anesthesia Preprocedure Evaluation (Addendum)
Anesthesia Evaluation  Patient identified by MRN, date of birth, ID band Patient awake    Reviewed: Allergy & Precautions, H&P , NPO status , Patient's Chart, lab work & pertinent test results, reviewed documented beta blocker date and time   History of Anesthesia Complications Negative for: history of anesthetic complications  Airway Mallampati: II  TM Distance: >3 FB Neck ROM: Full    Dental no notable dental hx. (+) Dental Advisory Given, Edentulous Upper, Edentulous Lower   Pulmonary former smoker,  breath sounds clear to auscultation  Pulmonary exam normal       Cardiovascular hypertension, Pt. on medications and Pt. on home beta blockers + angina with exertion + CAD and + CABG + dysrhythmias Atrial Fibrillation + pacemaker Rhythm:Regular Rate:Normal  Normal stress test 8/15, clearance per cardiology   Neuro/Psych negative neurological ROS  negative psych ROS   GI/Hepatic negative GI ROS, Neg liver ROS,   Endo/Other  negative endocrine ROS  Renal/GU negative Renal ROS  negative genitourinary   Musculoskeletal  (+) Arthritis -, Osteoarthritis,    Abdominal   Peds negative pediatric ROS (+)  Hematology negative hematology ROS (+)   Anesthesia Other Findings   Reproductive/Obstetrics negative OB ROS                            Anesthesia Physical Anesthesia Plan  ASA: III  Anesthesia Plan: General   Post-op Pain Management:    Induction: Intravenous  Airway Management Planned: LMA  Additional Equipment:   Intra-op Plan:   Post-operative Plan: Extubation in OR  Informed Consent: I have reviewed the patients History and Physical, chart, labs and discussed the procedure including the risks, benefits and alternatives for the proposed anesthesia with the patient or authorized representative who has indicated his/her understanding and acceptance.   Dental advisory given  Plan  Discussed with: CRNA  Anesthesia Plan Comments:         Anesthesia Quick Evaluation

## 2013-12-29 ENCOUNTER — Encounter (HOSPITAL_COMMUNITY)
Admission: RE | Disposition: A | Discharge status: Home / Self Care | Payer: Self-pay | Source: Ambulatory Visit | Attending: Urology

## 2013-12-29 ENCOUNTER — Ambulatory Visit (HOSPITAL_COMMUNITY): Payer: Medicare Other | Admitting: Anesthesiology

## 2013-12-29 ENCOUNTER — Encounter (HOSPITAL_COMMUNITY): Payer: Self-pay

## 2013-12-29 ENCOUNTER — Observation Stay (HOSPITAL_COMMUNITY)
Admission: RE | Admit: 2013-12-29 | Discharge: 2013-12-30 | Disposition: A | Discharge status: Home / Self Care | Payer: Medicare Other | Source: Ambulatory Visit | Attending: Urology | Admitting: Urology

## 2013-12-29 DIAGNOSIS — N401 Enlarged prostate with lower urinary tract symptoms: Secondary | ICD-10-CM | POA: Diagnosis present

## 2013-12-29 DIAGNOSIS — N138 Other obstructive and reflux uropathy: Secondary | ICD-10-CM

## 2013-12-29 DIAGNOSIS — E785 Hyperlipidemia, unspecified: Secondary | ICD-10-CM | POA: Diagnosis not present

## 2013-12-29 DIAGNOSIS — I4892 Unspecified atrial flutter: Secondary | ICD-10-CM | POA: Diagnosis not present

## 2013-12-29 DIAGNOSIS — I2510 Atherosclerotic heart disease of native coronary artery without angina pectoris: Secondary | ICD-10-CM | POA: Insufficient documentation

## 2013-12-29 DIAGNOSIS — R339 Retention of urine, unspecified: Secondary | ICD-10-CM | POA: Diagnosis not present

## 2013-12-29 DIAGNOSIS — Z882 Allergy status to sulfonamides status: Secondary | ICD-10-CM | POA: Diagnosis not present

## 2013-12-29 DIAGNOSIS — Z79899 Other long term (current) drug therapy: Secondary | ICD-10-CM | POA: Insufficient documentation

## 2013-12-29 DIAGNOSIS — Z950 Presence of cardiac pacemaker: Secondary | ICD-10-CM | POA: Insufficient documentation

## 2013-12-29 DIAGNOSIS — I4891 Unspecified atrial fibrillation: Secondary | ICD-10-CM | POA: Diagnosis not present

## 2013-12-29 DIAGNOSIS — Z87891 Personal history of nicotine dependence: Secondary | ICD-10-CM | POA: Diagnosis not present

## 2013-12-29 DIAGNOSIS — Z7901 Long term (current) use of anticoagulants: Secondary | ICD-10-CM | POA: Insufficient documentation

## 2013-12-29 DIAGNOSIS — M109 Gout, unspecified: Secondary | ICD-10-CM | POA: Diagnosis not present

## 2013-12-29 HISTORY — PX: TRANSURETHRAL RESECTION OF PROSTATE: SHX73

## 2013-12-29 LAB — PROTIME-INR
INR: 1.09 (ref 0.00–1.49)
Prothrombin Time: 14.3 seconds (ref 11.6–15.2)

## 2013-12-29 SURGERY — TRANSURETHRAL RESECTION OF THE PROSTATE WITH GYRUS INSTRUMENTS
Anesthesia: General

## 2013-12-29 MED ORDER — PROPOFOL 10 MG/ML IV BOLUS
INTRAVENOUS | Status: AC
  Filled 2013-12-29: qty 20

## 2013-12-29 MED ORDER — ALLOPURINOL 300 MG PO TABS
300.0000 mg | ORAL_TABLET | Freq: Every morning | ORAL | Status: DC
  Administered 2013-12-30: 300 mg via ORAL
  Filled 2013-12-29: qty 1

## 2013-12-29 MED ORDER — OXYCODONE-ACETAMINOPHEN 5-325 MG PO TABS: 1.0000 | ORAL_TABLET | ORAL | Status: DC | PRN

## 2013-12-29 MED ORDER — BELLADONNA ALKALOIDS-OPIUM 16.2-60 MG RE SUPP
RECTAL | Status: AC
  Filled 2013-12-29: qty 1

## 2013-12-29 MED ORDER — ONDANSETRON HCL 4 MG/2ML IJ SOLN: 4.0000 mg | INTRAMUSCULAR | Status: DC | PRN

## 2013-12-29 MED ORDER — CETYLPYRIDINIUM CHLORIDE 0.05 % MT LIQD
7.0000 mL | Freq: Two times a day (BID) | OROMUCOSAL | Status: DC
  Administered 2013-12-29 – 2013-12-30 (×3): 7 mL via OROMUCOSAL

## 2013-12-29 MED ORDER — PROPOFOL 10 MG/ML IV BOLUS
INTRAVENOUS | Status: DC | PRN
  Administered 2013-12-29: 200 mg via INTRAVENOUS

## 2013-12-29 MED ORDER — TAMSULOSIN HCL 0.4 MG PO CAPS
0.4000 mg | ORAL_CAPSULE | Freq: Two times a day (BID) | ORAL | Status: DC
  Administered 2013-12-29 – 2013-12-30 (×2): 0.4 mg via ORAL
  Filled 2013-12-29 (×2): qty 1

## 2013-12-29 MED ORDER — ATORVASTATIN CALCIUM 40 MG PO TABS
40.0000 mg | ORAL_TABLET | Freq: Every evening | ORAL | Status: DC
  Administered 2013-12-29: 40 mg via ORAL
  Filled 2013-12-29 (×2): qty 1

## 2013-12-29 MED ORDER — SODIUM CHLORIDE 0.9 % IR SOLN
Status: DC | PRN
  Administered 2013-12-29: 6000 mL via INTRAVESICAL

## 2013-12-29 MED ORDER — FENTANYL CITRATE 0.05 MG/ML IJ SOLN
25.0000 ug | INTRAMUSCULAR | Status: DC | PRN
  Administered 2013-12-29 (×2): 50 ug via INTRAVENOUS

## 2013-12-29 MED ORDER — LISINOPRIL 20 MG PO TABS
20.0000 mg | ORAL_TABLET | Freq: Every day | ORAL | Status: DC
  Administered 2013-12-29: 20 mg via ORAL
  Filled 2013-12-29: qty 1

## 2013-12-29 MED ORDER — ONDANSETRON HCL 4 MG/2ML IJ SOLN
INTRAMUSCULAR | Status: DC | PRN
  Administered 2013-12-29: 4 mg via INTRAVENOUS

## 2013-12-29 MED ORDER — LIDOCAINE HCL 2 % EX GEL
CUTANEOUS | Status: DC | PRN
  Administered 2013-12-29: 1

## 2013-12-29 MED ORDER — LIDOCAINE HCL (CARDIAC) 20 MG/ML IV SOLN
INTRAVENOUS | Status: DC | PRN
  Administered 2013-12-29: 10 mg via INTRAVENOUS

## 2013-12-29 MED ORDER — LIDOCAINE HCL (CARDIAC) 20 MG/ML IV SOLN
INTRAVENOUS | Status: AC
  Filled 2013-12-29: qty 5

## 2013-12-29 MED ORDER — PHENYLEPHRINE HCL 10 MG/ML IJ SOLN
INTRAMUSCULAR | Status: DC | PRN
  Administered 2013-12-29 (×4): 80 ug via INTRAVENOUS

## 2013-12-29 MED ORDER — DOCUSATE SODIUM 100 MG PO CAPS
100.0000 mg | ORAL_CAPSULE | Freq: Two times a day (BID) | ORAL | Status: DC
  Administered 2013-12-29 – 2013-12-30 (×2): 100 mg via ORAL
  Filled 2013-12-29 (×2): qty 1

## 2013-12-29 MED ORDER — OXYBUTYNIN CHLORIDE 5 MG PO TABS
5.0000 mg | ORAL_TABLET | Freq: Three times a day (TID) | ORAL | Status: DC | PRN
  Administered 2013-12-29: 5 mg via ORAL
  Filled 2013-12-29 (×3): qty 1

## 2013-12-29 MED ORDER — SODIUM CHLORIDE 0.9 % IV SOLN
10.0000 mg | INTRAVENOUS | Status: DC | PRN
  Administered 2013-12-29: 20 ug/min via INTRAVENOUS

## 2013-12-29 MED ORDER — ONDANSETRON HCL 4 MG/2ML IJ SOLN: 4.0000 mg | Freq: Once | INTRAMUSCULAR | Status: DC | PRN

## 2013-12-29 MED ORDER — FENTANYL CITRATE 0.05 MG/ML IJ SOLN
INTRAMUSCULAR | Status: DC | PRN
  Administered 2013-12-29 (×4): 25 ug via INTRAVENOUS

## 2013-12-29 MED ORDER — KCL IN DEXTROSE-NACL 20-5-0.9 MEQ/L-%-% IV SOLN
INTRAVENOUS | Status: DC
  Administered 2013-12-29: 14:00:00 via INTRAVENOUS
  Filled 2013-12-29 (×3): qty 1000

## 2013-12-29 MED ORDER — ATORVASTATIN CALCIUM 40 MG PO TABS: 40.0000 mg | ORAL_TABLET | Freq: Every day | ORAL | Status: DC

## 2013-12-29 MED ORDER — LACTATED RINGERS IV SOLN
INTRAVENOUS | Status: DC | PRN
  Administered 2013-12-29: 08:00:00 via INTRAVENOUS

## 2013-12-29 MED ORDER — LIDOCAINE HCL 2 % EX GEL
CUTANEOUS | Status: AC
  Filled 2013-12-29: qty 10

## 2013-12-29 MED ORDER — SOTALOL HCL 120 MG PO TABS
120.0000 mg | ORAL_TABLET | Freq: Two times a day (BID) | ORAL | Status: DC
  Administered 2013-12-29 – 2013-12-30 (×2): 120 mg via ORAL
  Filled 2013-12-29 (×2): qty 1

## 2013-12-29 MED ORDER — SODIUM CHLORIDE 0.9 % IR SOLN
3000.0000 mL | Status: DC
  Administered 2013-12-29: 3000 mL

## 2013-12-29 MED ORDER — DEXAMETHASONE SODIUM PHOSPHATE 10 MG/ML IJ SOLN
INTRAMUSCULAR | Status: DC | PRN
  Administered 2013-12-29: 10 mg via INTRAVENOUS

## 2013-12-29 MED ORDER — NITROGLYCERIN 0.4 MG SL SUBL: 0.4000 mg | SUBLINGUAL_TABLET | SUBLINGUAL | Status: DC | PRN

## 2013-12-29 MED ORDER — FENTANYL CITRATE 0.05 MG/ML IJ SOLN
INTRAMUSCULAR | Status: AC
  Filled 2013-12-29: qty 2

## 2013-12-29 MED ORDER — CARVEDILOL 3.125 MG PO TABS
3.1250 mg | ORAL_TABLET | Freq: Two times a day (BID) | ORAL | Status: DC
  Administered 2013-12-29 – 2013-12-30 (×2): 3.125 mg via ORAL
  Filled 2013-12-29 (×2): qty 1

## 2013-12-29 MED ORDER — CIPROFLOXACIN IN D5W 400 MG/200ML IV SOLN
INTRAVENOUS | Status: AC
  Filled 2013-12-29: qty 200

## 2013-12-29 MED ORDER — BELLADONNA ALKALOIDS-OPIUM 16.2-60 MG RE SUPP
RECTAL | Status: DC | PRN
  Administered 2013-12-29: 1 via RECTAL

## 2013-12-29 MED ORDER — MORPHINE SULFATE 2 MG/ML IJ SOLN: 2.0000 mg | INTRAMUSCULAR | Status: DC | PRN

## 2013-12-29 MED ORDER — CIPROFLOXACIN IN D5W 400 MG/200ML IV SOLN
400.0000 mg | Freq: Two times a day (BID) | INTRAVENOUS | Status: AC
  Administered 2013-12-29: 400 mg via INTRAVENOUS
  Filled 2013-12-29: qty 200

## 2013-12-29 MED ORDER — ONDANSETRON HCL 4 MG/2ML IJ SOLN
INTRAMUSCULAR | Status: AC
  Filled 2013-12-29: qty 2

## 2013-12-29 MED ORDER — DEXAMETHASONE SODIUM PHOSPHATE 10 MG/ML IJ SOLN
INTRAMUSCULAR | Status: AC
  Filled 2013-12-29: qty 1

## 2013-12-29 MED ORDER — CIPROFLOXACIN IN D5W 400 MG/200ML IV SOLN
400.0000 mg | INTRAVENOUS | Status: AC
  Administered 2013-12-29: 400 mg via INTRAVENOUS

## 2013-12-29 MED ORDER — PHENYLEPHRINE HCL 10 MG/ML IJ SOLN
INTRAMUSCULAR | Status: AC
  Filled 2013-12-29: qty 1

## 2013-12-29 SURGICAL SUPPLY — 21 items
BAG URINE DRAINAGE (UROLOGICAL SUPPLIES) ×2 IMPLANT
BAG URO CATCHER STRL LF (DRAPE) ×2 IMPLANT
BLADE SURG 15 STRL LF DISP TIS (BLADE) IMPLANT
BLADE SURG 15 STRL SS (BLADE)
CATH FOLEY 3WAY 30CC 22FR (CATHETERS) ×2 IMPLANT
DRAPE CAMERA CLOSED 9X96 (DRAPES) ×2 IMPLANT
ELECT LOOP MED HF 24F 12D CBL (CLIP) ×1 IMPLANT
ELECT REM PT RETURN 9FT ADLT (ELECTROSURGICAL) ×2
ELECTRODE REM PT RTRN 9FT ADLT (ELECTROSURGICAL) ×1 IMPLANT
EVACUATOR MICROVAS BLADDER (UROLOGICAL SUPPLIES) ×1 IMPLANT
GLOVE BIOGEL M STRL SZ7.5 (GLOVE) ×2 IMPLANT
GOWN STRL REUS W/TWL LRG LVL3 (GOWN DISPOSABLE) ×4 IMPLANT
HOLDER FOLEY CATH W/STRAP (MISCELLANEOUS) ×1 IMPLANT
IV NS IRRIG 3000ML ARTHROMATIC (IV SOLUTION) ×2 IMPLANT
KIT ASPIRATION TUBING (SET/KITS/TRAYS/PACK) ×2 IMPLANT
LOOPS RESECTOSCOPE DISP (ELECTROSURGICAL) ×1 IMPLANT
MANIFOLD NEPTUNE II (INSTRUMENTS) ×2 IMPLANT
PACK CYSTO (CUSTOM PROCEDURE TRAY) ×2 IMPLANT
SUT ETHILON 3 0 PS 1 (SUTURE) IMPLANT
SYR 30ML LL (SYRINGE) IMPLANT
TUBING CONNECTING 10 (TUBING) ×2 IMPLANT

## 2013-12-29 NOTE — Anesthesia Postprocedure Evaluation (Signed)
  Anesthesia Post-op Note  Patient: Dylan Roth  Procedure(s) Performed: Procedure(s) (LRB): TRANSURETHRAL RESECTION OF THE PROSTATE WITH GYRUS INSTRUMENTS (N/A)  Patient Location: PACU  Anesthesia Type: General  Level of Consciousness: awake and alert   Airway and Oxygen Therapy: Patient Spontanous Breathing  Post-op Pain: mild  Post-op Assessment: Post-op Vital signs reviewed, Patient's Cardiovascular Status Stable, Respiratory Function Stable, Patent Airway and No signs of Nausea or vomiting  Last Vitals:  Filed Vitals:   12/29/13 1100  BP: 133/82  Pulse: 61  Temp: 36.4 C  Resp: 16    Post-op Vital Signs: stable   Complications: No apparent anesthesia complications

## 2013-12-29 NOTE — Transfer of Care (Signed)
Immediate Anesthesia Transfer of Care Note  Patient: Dylan Roth  Procedure(s) Performed: Procedure(s): TRANSURETHRAL RESECTION OF THE PROSTATE WITH GYRUS INSTRUMENTS (N/A)  Patient Location: PACU  Anesthesia Type:General  Level of Consciousness: sedated  Airway & Oxygen Therapy: Patient Spontanous Breathing and Patient connected to face mask oxygen  Post-op Assessment: Report given to PACU RN and Post -op Vital signs reviewed and stable  Post vital signs: Reviewed and stable  Complications: No apparent anesthesia complications

## 2013-12-29 NOTE — Plan of Care (Signed)
Problem: Phase I Progression Outcomes Goal: Pain controlled with appropriate interventions Outcome: Completed/Met Date Met:  12/29/13 Goal: Bedrest x 6 hrs for spinal anesthesia Outcome: Completed/Met Date Met:  12/29/13 Goal: No urinary obstruction (foley to traction) Outcome: Completed/Met Date Met:  12/29/13 Goal: Initial discharge plan identified Outcome: Completed/Met Date Met:  12/29/13 Goal: Tubes/drains patent Outcome: Completed/Met Date Met:  12/29/13 Goal: Continue bladder and/or hand irrigation Outcome: Completed/Met Date Met:  12/29/13 Goal: Hemodynamically stable Outcome: Completed/Met Date Met:  12/29/13 Goal: Tolerating diet Outcome: Completed/Met Date Met:  12/29/13 Goal: No symptoms of Post-TURP Syndrome Outcome: Completed/Met Date Met:  12/29/13

## 2013-12-29 NOTE — H&P (Signed)
History of Present Illness       Dylan Roth is currently 73 years of age and here for routine 6 month follow-up.. 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 He's been well managed on generic Flomax q day. Recently he has had a progression in his voiding symptoms and feels like the medication has not been as helpful. His AUA symptom score in September of 2013 was 24 with a bothersome score of 3. The patient underwent additional assessment. His initial postvoid residual was determined to be 180 mL. The patient came back for a flow rate which showed a peak flow of 4 mL per second and a mean flow of 2 mL per second. Repeat postvoid residual was 80 mL. Cystoscopically the patient had a 4-1/2-5 cm prostatic urethra with clear evidence of visual obstruction and significant trilobar hyperplasia. The patient was also noted to have a small middle lobe. His Flomax was increased to 2 tablets q. day. We did discuss with him some surgical options. We did start him on finasteride which he has been taking now for 2 years. We are contemplating the possibility of TURP.     AUA score in 1/ 2014 was 18. PSA 9/ 2012 was 0.89.     6 months ago he reported improved voiding but PVR was 233ml. Currently he feels like overall voiding is still getting better.    Urine today was clear. PVR was 470 ml. I asked him to void again and he voided about 275ml.   Past Medical History Problems  1. History of Atrial Flutter 2. History of Benign essential hypertension (I10) 3. History of Cardiac Devices Pacemaker Present 4. History of Coronary Artery Disease 5. History of Difficulty Breathing (Dyspnea) 6. History of Gout (M10.9) 7. History of hyperlipidemia (Z86.39) 8. History of hypotension (Z86.79) 9. History of Palpitations 10. History of RBC Morphology - Macrocytes 11. History of Skin Neoplasm Of Uncertain Behavior  Surgical History Problems  1. History of CABG (CABG) 2. History  of Cataract Surgery 3. History of Pacemaker Placement  Current Meds 1. Allopurinol 300 MG Oral Tablet;  Therapy: (Recorded:16Sep2013) to Recorded 2. Atorvastatin Calcium 40 MG Oral Tablet;  Therapy: (Recorded:16Sep2013) to Recorded 3. Carvedilol 12.5 MG Oral Tablet; takes 1/2 tablet BID;  Therapy: (Recorded:31Jan2014) to Recorded 4. Coumadin 5 MG Oral Tablet;  Therapy: (Recorded:16Sep2013) to Recorded 5. Doxycycline Hyclate 100 MG Oral Capsule;  Therapy: (MGQQPYPP:[id number]) to Recorded 6. Finasteride 5 MG Oral Tablet; TAKE ONE TABLET BY MOUTH ONCE DAILY AS  DIRECTED;  Therapy: 29Oct2013 to (Evaluate:24Jan2016)  Requested for: 26Oct2015; Last  Rx:26Oct2015 Ordered 7. Lisinopril 20 MG Oral Tablet;  Therapy: (Recorded:16Sep2013) to Recorded 8. Nitrostat 0.4 MG Sublingual Tablet Sublingual;  Therapy: (Recorded:16Sep2013) to Recorded 9. Sotalol HCl - 120 MG Oral Tablet;  Therapy: (Recorded:16Sep2013) to Recorded 10. Tamsulosin HCl - 0.4 MG Oral Capsule; TAKE TWO CAPSULES BY MOUTH ONCE   DAILY;   Therapy: 15Apr2014 to (Evaluate:03Nov2015)  Requested for: 07Sep2015; Last   Rx:04Sep2015 Ordered 11. Viagra 100 MG Oral Tablet;   Therapy: (Recorded:16Sep2013) to Recorded  Allergies Medication  1. Sulfamethoxazole-TMP DS TABS  Family History Problems  1. Family history of Death In The Family Father : Father   70yrs 2. Family history of Death In The Family Mother : Mother   69yrs 90. Family history of Diabetes Mellitus : Daughter 4. Family history of Family Health Status Number Of Children   2 daughters 76. Family history of No Significant Family History  Social History Problems  1. Denied: History of Alcohol Use 2. Caffeine Use   2 qd 3. Marital History - Currently Married 4. Never A Smoker 5. Occupation:   truck Geophysicist/field seismologist 6. Denied: History of Tobacco Use  Review of Systems Genitourinary, constitutional, skin, eye, otolaryngeal, hematologic/lymphatic, cardiovascular,  pulmonary, endocrine, musculoskeletal, gastrointestinal, neurological and psychiatric system(s) were reviewed and pertinent findings if present are noted.  Genitourinary: nocturia, difficulty starting the urinary stream, weak urinary stream, urinary stream starts and stops, incomplete emptying of bladder and erectile dysfunction.  Cardiovascular: chest pain.  Musculoskeletal: back pain and joint pain.    Vitals Vital Signs [Data Includes: Last 1 Day]  Recorded: 29Oct2015 12:46PM  Blood Pressure: 152 / 106 Temperature: 97.5 F Heart Rate: 93  Physical Exam Rectal: Rectal exam demonstrates normal sphincter tone, no tenderness and no masses. Estimated prostate size is 2+. The prostate has no nodularity and is not tender. The left seminal vesicle is nonpalpable. The right seminal vesicle is nonpalpable. The perineum is normal on inspection.    Results/Data Urine [Data Includes: Last 1 Day]   29Oct2015  COLOR YELLOW   APPEARANCE CLEAR   SPECIFIC GRAVITY <1.005   pH 6.0   GLUCOSE NEG mg/dL  BILIRUBIN NEG   KETONE NEG mg/dL  BLOOD NEG   PROTEIN NEG mg/dL  UROBILINOGEN 0.2 mg/dL  NITRITE NEG   LEUKOCYTE ESTERASE NEG    PVR: Ultrasound PVR 470 ml.    Assessment Assessed  1. Incomplete bladder emptying (R33.9) 2. Benign prostatic hyperplasia with urinary obstruction (N40.1)  Plan Health Maintenance  1. UA With REFLEX; [Do Not Release]; Status:Complete;   Done: [id number] 11:43AM Incomplete bladder emptying  2. Follow-up Month x 6 Office  Follow-up  Status: Hold For - Date of Service  Requested for:  29Apr2016 3. PVR U/S; Status:Hold For - Date of Service; Requested for:29Apr2016;   Discussion/Summary   Mr Dylan Roth has had some fairly longstanding, obstructive, and irritative voiding symptoms. We personally have been looking after him for approximately 2 years. Cystoscopically, he does have visual obstruction. He has had documented incomplete bladder emptying, but subjectively has  been doing okay. He remains on combined therapy with tamsulosin and finasteride. A PSA drawn at his primary care provider within the past year was less than 1 and that is really of no concern. Today's postvoid residual was 470 mL, but I asked him to void again and he voided an additional 250 cc with an okay stream. He clearly has incomplete bladder emptying but no signs of any really severe bladder decompensation. I do think he is borderline for requiring something to be done, given his incomplete bladder emptying and ongoing voiding symptoms. He feels, however, subjectively that things are a little better the last 6-12 months and therefore we will continue to monitor his situation. Follow up again in 6 months. If he does develop urinary retention or clearly has a worse time emptying his bladder and/or his symptoms progress, then I do think we need to talk to him about TURP. We spent approximately 25 minutes of face to face time discussing his situation and that was the majority of his office visit today.

## 2013-12-29 NOTE — Interval H&P Note (Signed)
History and Physical Interval Note:  12/29/2013 8:58 AM  Dylan Roth  has presented today for surgery, with the diagnosis of BENIGN PROSTATIC HYPERPLASIA  The various methods of treatment have been discussed with the patient and family. After consideration of risks, benefits and other options for treatment, the patient has consented to  Procedure(s): TRANSURETHRAL RESECTION OF THE PROSTATE WITH GYRUS INSTRUMENTS (N/A) as a surgical intervention .  The patient's history has been reviewed, patient examined, no change in status, stable for surgery.  I have reviewed the patient's chart and labs.  Questions were answered to the patient's satisfaction.     [name] S

## 2013-12-29 NOTE — Plan of Care (Signed)
Problem: Discharge Progression Outcomes Goal: Pain controlled with appropriate interventions Outcome: Completed/Met Date Met:  12/29/13

## 2013-12-29 NOTE — Op Note (Signed)
Preoperative diagnosis BPH Postoperative diagnosis: Same Procedure: Saline gyrus TURP   Surgeon: [name] M.D.  Anesthesia: Gen.  Indications: 74 year old male with long-standing obstructive voiding symptoms. He has been on alpha blocker therapy and for the last 2 years finasteride. He has had documented postvoid residuals in the 200-400 mL range. Visually he does have significant trilobar hyperplasia. We feel the majority his voiding symptoms are secondary to outlet obstruction from BPH but he may have component of hypotonic bladder. He elected to proceed with an attempted more definitive management. Risks benefits discussed with him. He is on chronic anticoagulation and cardiac consultation was obtained for preoperative clearance and approval of discontinuation of Coumadin perioperatively.     Technique and findings: Patient was brought the operating room where he had successful induction general anesthesia. He was placed in lithotomy position and prepped and draped in usual manner. Appropriate surgical timeout was performed. The patient received perioperative antibiotics and placement of PAS compression boots. A 28 French continuous flow resectoscope was inserted through a visual obturator. Again trilobar hyperplasia was noted but most prominently was a very high riding median bar with a small middle lobe. The bladder showed trabecular change and no other pathology. A loop was used for tissue resection with gyrus instrumentation and saline irrigation. Primary resection was on the median bar and middle lobe. With completion of that visual obstruction was markedly improved. We elected to take down some lateral lobe tissue bilaterally but did not feel an overly aggressive TURP was indicated given the improvement visually with the resection of the median bar. Hemostasis was excellent and urine was clear at the completion of the procedure. Prostate chips were sent for pathologic analysis. A continuous  flow Foley catheter was inserted and he will receive continuous irrigation with saline. No obvious complications occurred the patient was brought to PACU in stable condition.

## 2013-12-30 ENCOUNTER — Encounter (HOSPITAL_COMMUNITY): Payer: Self-pay | Admitting: Urology

## 2013-12-30 DIAGNOSIS — N401 Enlarged prostate with lower urinary tract symptoms: Secondary | ICD-10-CM | POA: Diagnosis not present

## 2013-12-30 LAB — BASIC METABOLIC PANEL
ANION GAP: 12 (ref 5–15)
BUN: 19 mg/dL (ref 6–23)
CALCIUM: 8.6 mg/dL (ref 8.4–10.5)
CO2: 22 mEq/L (ref 19–32)
Chloride: 103 mEq/L (ref 96–112)
Creatinine, Ser: 1.05 mg/dL (ref 0.50–1.35)
GFR, EST AFRICAN AMERICAN: 80 mL/min — AB (ref >90)
GFR, EST NON AFRICAN AMERICAN: 69 mL/min — AB (ref >90)
Glucose, Bld: 153 mg/dL — ABNORMAL HIGH (ref 70–99)
Potassium: 4.6 mEq/L (ref 3.7–5.3)
SODIUM: 137 meq/L (ref 137–147)

## 2013-12-30 LAB — HEMOGLOBIN AND HEMATOCRIT, BLOOD
HEMATOCRIT: 38.9 % — AB (ref 39.0–52.0)
HEMOGLOBIN: 13.2 g/dL (ref 13.0–17.0)

## 2013-12-30 MED ORDER — OXYCODONE-ACETAMINOPHEN 5-325 MG PO TABS: 1.0000 | ORAL_TABLET | ORAL | Status: DC | PRN

## 2013-12-30 MED ORDER — CIPROFLOXACIN HCL 500 MG PO TABS: 500.0000 mg | ORAL_TABLET | Freq: Two times a day (BID) | ORAL | Status: DC

## 2013-12-30 MED ORDER — ONDANSETRON HCL 4 MG/2ML IJ SOLN: 4.0000 mg | INTRAMUSCULAR | Status: DC | PRN

## 2013-12-30 NOTE — Progress Notes (Signed)
Leg bag/foley bag teaching to pt and wife

## 2013-12-30 NOTE — Discharge Summary (Signed)
Patient ID: Dylan Roth MRN: [id number] DOB/AGE: [birth year] 73 y.o.  Admit date: 12/29/2013 Discharge date: 12/30/2013    Discharge Diagnoses:   Present on Admission:  **None**  Consults:  None    Discharge Medications:   Medication List    STOP taking these medications        sildenafil 50 MG tablet  Commonly known as:  VIAGRA     warfarin 5 MG tablet  Commonly known as:  COUMADIN      TAKE these medications        allopurinol 300 MG tablet  Commonly known as:  ZYLOPRIM  Take 300 mg by mouth every morning.     atorvastatin 40 MG tablet  Commonly known as:  LIPITOR  Take 40 mg by mouth every evening.     carvedilol 3.125 MG tablet  Commonly known as:  COREG  Take 1 tablet (3.125 mg total) by mouth 2 (two) times daily with a meal.     cholestyramine 4 G packet  Commonly known as:  QUESTRAN  Take 4 g by mouth 2 (two) times daily as needed (frequent bathroom use).     ciprofloxacin 500 MG tablet  Commonly known as:  CIPRO  Take 1 tablet (500 mg total) by mouth 2 (two) times daily.     finasteride 5 MG tablet  Commonly known as:  PROSCAR  Take 5 mg by mouth every evening.     Fish Oil 1000 MG Caps  Take 1 capsule by mouth every morning.     FLOMAX 0.4 MG Caps capsule  Generic drug:  tamsulosin  Take 0.4 mg by mouth 2 (two) times daily.     lisinopril 20 MG tablet  Commonly known as:  PRINIVIL,ZESTRIL  Take 20 mg by mouth at bedtime.     nitroGLYCERIN 0.4 MG SL tablet  Commonly known as:  NITROSTAT  Place 1 tablet (0.4 mg total) under the tongue every 5 (five) minutes as needed. For chest pain     ondansetron 4 MG/2ML Soln injection  Commonly known as:  ZOFRAN  Inject 2 mLs (4 mg total) into the vein every 4 (four) hours as needed for nausea or vomiting.     oxyCODONE-acetaminophen 5-325 MG per tablet  Commonly known as:  PERCOCET/ROXICET  Take 1-2 tablets by mouth every 4 (four) hours as needed for moderate pain.     sotalol 120 MG tablet   Commonly known as:  BETAPACE  TAKE ONE TABLET BY MOUTH EVERY 12 HOURS         Significant Diagnostic Studies:  Dg Chest 2 View  12/23/2013   CLINICAL DATA:  Preop exam prior to TURP next week ; history of previous CABG and pacemaker placement; occasional chest pain  EXAM: CHEST  2 VIEW  COMPARISON:  Portable chest x-ray dated September 22, 2011  FINDINGS: The lungs are adequately inflated and clear. The cardiac silhouette is mildly enlarged. The pulmonary vascularity is normal. There are stable surgical clips in the left suprahilar region. The permanent pacemaker is in appropriate position radiographically. The sternal wires are intact where visualized. There is calcification of the anterior longitudinal ligament of the thoracic spine.  IMPRESSION: There is mild enlargement the cardiac silhouette and post CABG changes. There is no evidence of CHF, pneumonia, nor other acute cardiopulmonary abnormality.   Electronically Signed   By: Jazalynn Mireles  [place]   On: 12/23/2013 15:55      Hospital Course:  Active Problems:   BPH with urinary obstruction  Patient underwent Saline Gyrus TURP secondary to long-standing and progressive BNO symptoms.  Patient was kept overnight for supportive care and monitoring. Urine remained light pink in color. He had no obvious problems or difficulties. Patient was discharged the following morning with his catheter indwelling to a leg bag.  Day of Discharge BP 104/67 mmHg  Pulse 60  Temp(Src) 97.3 F (36.3 C) (Axillary)  Resp 18  Ht 5\' 6"  (1.676 m)  Wt 91.4 kg (201 lb 8 oz)  BMI 32.54 kg/m2  SpO2 98%   Well-developed well-nourished male. Respiratory: Normal effort Abdomen: Soft nontender Genitourinary: Indwelling three-way Foley catheter draining light to medium pink urine Extremities: No tenderness or edema  Results for orders placed or performed during the hospital encounter of 12/29/13 (from the past 24 hour(s))  Basic metabolic panel     Status: Abnormal    Collection Time: 12/30/13  4:55 AM  Result Value Ref Range   Sodium 137 137 - 147 mEq/L   Potassium 4.6 3.7 - 5.3 mEq/L   Chloride 103 96 - 112 mEq/L   CO2 22 19 - 32 mEq/L   Glucose, Bld 153 (H) 70 - 99 mg/dL   BUN 19 6 - 23 mg/dL   Creatinine, Ser 1.05 0.50 - 1.35 mg/dL   Calcium 8.6 8.4 - 10.5 mg/dL   GFR calc non Af Amer 69 (L) >90 mL/min   GFR calc Af Amer 80 (L) >90 mL/min   Anion gap 12 5 - 15  Hemoglobin and hematocrit, blood     Status: Abnormal   Collection Time: 12/30/13  4:55 AM  Result Value Ref Range   Hemoglobin 13.2 13.0 - 17.0 g/dL   HCT 38.9 (L) 39.0 - 52.0 %

## 2013-12-30 NOTE — Discharge Instructions (Signed)
Post transurethral resection of the prostate (TURP) instructions ° °Your recent prostate surgery requires very special post hospital care. Despite the fact that no skin incisions were used the area around the prostate incision is quite raw and is covered with a scab to promote healing and prevent bleeding. Certain cautions are needed to assure that the scab is not disturbed of the next 2-3 weeks while the healing proceeds. ° °Because the raw surface in your prostate and the irritating effects of urine you may expect frequency of urination and/or urgency (a stronger desire to urinate) and perhaps even getting up at night more often. This will usually resolve or improve slowly over the healing period. You may see some blood in your urine over the first 6 weeks. Do not be alarmed, even if the urine was clear for a while. Get off your feet and drink lots of fluids until clearing occurs. If you start to pass clots or don't improve call us. ° °Catheter: (If you are discharged with a catheter.) °1. Keep your catheter secured to your leg at all times with tape or the supplied strap. °2. You may experience leakage of urine around your catheter- as long as the  °catheter continues to drain, this is normal.  If your catheter stops draining  °go to the ER. °3. You may also have blood in your urine, even after it has been clear for  °several days; you may even pass some small blood clots or other material.  This  °is normal as well.  If this happens, sit down and drink plenty of water to help  °make urine to flush out your bladder.  If the blood in your urine becomes worse  °after doing this, contact our office or return to the ER. °4. You may use the leg bag (small bag) during the day, but use the large bag at  °night. ° °Diet: ° °You may return to your normal diet immediately. Because of the raw surface of your bladder, alcohol, spicy foods, foods high in acid and drinks with caffeine may cause irritation or frequency and  should be used in moderation. To keep your urine flowing freely and avoid constipation, drink plenty of fluids during the day (8-10 glasses). Tip: Avoid cranberry juice because it is very acidic. ° °Activity: ° °Your physical activity doesn't need to be restricted. However, if you are very active, you may see some blood in the urine. We suggest that you reduce your activity under the circumstances until the bleeding has stopped. ° °Bowels: ° °It is important to keep your bowels regular during the postoperative period. Straining with bowel movements can cause bleeding. A bowel movement every other day is reasonable. Use a mild laxative if needed, such as milk of magnesia 2-3 tablespoons, or 2 Dulcolax tablets. Call if you continue to have problems. If you had been taking narcotics for pain, before, during or after your surgery, you may be constipated. Take a laxative if necessary. ° °Medication: ° °You should resume your pre-surgery medications unless told not to. DO NOT RESUME YOUR ASPIRIN, WARFARIN, OR OTHER BLOOD THINNER FOR 1 WEEK. In addition you may be given an antibiotic to prevent or treat infection. Antibiotics are not always necessary. All medication should be taken as prescribed until the bottles are finished unless you are having an unusual reaction to one of the drugs. ° ° ° ° °Problems you should report to us: ° °a. Fever greater than 101°F. °b. Heavy bleeding, or clots (see   notes above about blood in urine). c. Inability to urinate. d. Drug reactions (hives, rash, nausea, vomiting, diarrhea). e. Severe burning or pain with urination that is not improving.  May restart Coumadin on 12-3

## 2014-01-08 ENCOUNTER — Encounter (HOSPITAL_COMMUNITY): Payer: Self-pay | Admitting: Internal Medicine

## 2014-01-24 ENCOUNTER — Other Ambulatory Visit: Payer: Self-pay | Admitting: Cardiovascular Disease

## 2014-01-26 NOTE — Telephone Encounter (Signed)
Rx refill sent to patient pharmacy   

## 2014-01-28 ENCOUNTER — Telehealth: Payer: Self-pay | Admitting: Cardiovascular Disease

## 2014-01-28 NOTE — Telephone Encounter (Signed)
Spoke with pt.  He had a TURP at the end of November.  His Coumadin was held during this time (unsure of how long or when he restarted because we have no documentation).  Pt states he started having trouble urinating so he went to ER at [facility].  States they removed several blood clots from his bladder.  He thinks his INR was 1.7 but not sure.  He is scheduled to go to see his urologist tomorrow. Will reschedule his Coumadin so that he can come to [place] only one time this week.

## 2014-01-28 NOTE — Telephone Encounter (Signed)
New msg      Pt had some blood clots removed yesterday and wants to know if he should still come in today for Coumadin?    Please contact pt with info.

## 2014-01-29 ENCOUNTER — Other Ambulatory Visit: Payer: Self-pay | Admitting: Urology

## 2014-01-29 ENCOUNTER — Emergency Department (HOSPITAL_COMMUNITY)
Admission: EM | Admit: 2014-01-29 | Discharge: 2014-01-29 | Disposition: A | Discharge status: Home / Self Care | Payer: Medicare Other | Source: Home / Self Care | Attending: Emergency Medicine | Admitting: Emergency Medicine

## 2014-01-29 ENCOUNTER — Encounter (HOSPITAL_COMMUNITY): Payer: Self-pay

## 2014-01-29 ENCOUNTER — Encounter (HOSPITAL_COMMUNITY): Payer: Self-pay | Admitting: Emergency Medicine

## 2014-01-29 ENCOUNTER — Inpatient Hospital Stay (HOSPITAL_COMMUNITY)
Admission: AD | Admit: 2014-01-29 | Discharge: 2014-01-31 | DRG: 700 | Disposition: A | Discharge status: Home / Self Care | Payer: Medicare Other | Source: Ambulatory Visit | Attending: Urology | Admitting: Urology

## 2014-01-29 DIAGNOSIS — Z9842 Cataract extraction status, left eye: Secondary | ICD-10-CM | POA: Diagnosis not present

## 2014-01-29 DIAGNOSIS — Z7901 Long term (current) use of anticoagulants: Secondary | ICD-10-CM

## 2014-01-29 DIAGNOSIS — I4891 Unspecified atrial fibrillation: Secondary | ICD-10-CM

## 2014-01-29 DIAGNOSIS — E669 Obesity, unspecified: Secondary | ICD-10-CM

## 2014-01-29 DIAGNOSIS — R310 Gross hematuria: Secondary | ICD-10-CM | POA: Diagnosis present

## 2014-01-29 DIAGNOSIS — M545 Low back pain: Secondary | ICD-10-CM | POA: Diagnosis present

## 2014-01-29 DIAGNOSIS — Z79899 Other long term (current) drug therapy: Secondary | ICD-10-CM

## 2014-01-29 DIAGNOSIS — I10 Essential (primary) hypertension: Secondary | ICD-10-CM

## 2014-01-29 DIAGNOSIS — G8929 Other chronic pain: Secondary | ICD-10-CM

## 2014-01-29 DIAGNOSIS — Z950 Presence of cardiac pacemaker: Secondary | ICD-10-CM | POA: Insufficient documentation

## 2014-01-29 DIAGNOSIS — Z87891 Personal history of nicotine dependence: Secondary | ICD-10-CM | POA: Insufficient documentation

## 2014-01-29 DIAGNOSIS — Z792 Long term (current) use of antibiotics: Secondary | ICD-10-CM | POA: Insufficient documentation

## 2014-01-29 DIAGNOSIS — M109 Gout, unspecified: Secondary | ICD-10-CM | POA: Diagnosis present

## 2014-01-29 DIAGNOSIS — Z951 Presence of aortocoronary bypass graft: Secondary | ICD-10-CM | POA: Diagnosis not present

## 2014-01-29 DIAGNOSIS — Z961 Presence of intraocular lens: Secondary | ICD-10-CM | POA: Diagnosis present

## 2014-01-29 DIAGNOSIS — M1990 Unspecified osteoarthritis, unspecified site: Secondary | ICD-10-CM | POA: Insufficient documentation

## 2014-01-29 DIAGNOSIS — E785 Hyperlipidemia, unspecified: Secondary | ICD-10-CM

## 2014-01-29 DIAGNOSIS — Z6832 Body mass index (BMI) 32.0-32.9, adult: Secondary | ICD-10-CM

## 2014-01-29 DIAGNOSIS — I252 Old myocardial infarction: Secondary | ICD-10-CM

## 2014-01-29 DIAGNOSIS — I2510 Atherosclerotic heart disease of native coronary artery without angina pectoris: Secondary | ICD-10-CM | POA: Diagnosis present

## 2014-01-29 DIAGNOSIS — Z9841 Cataract extraction status, right eye: Secondary | ICD-10-CM

## 2014-01-29 DIAGNOSIS — R339 Retention of urine, unspecified: Secondary | ICD-10-CM

## 2014-01-29 DIAGNOSIS — R319 Hematuria, unspecified: Secondary | ICD-10-CM | POA: Diagnosis not present

## 2014-01-29 DIAGNOSIS — N029 Recurrent and persistent hematuria with unspecified morphologic changes: Principal | ICD-10-CM | POA: Diagnosis present

## 2014-01-29 LAB — URINALYSIS, ROUTINE W REFLEX MICROSCOPIC
Glucose, UA: 250 mg/dL — AB
NITRITE: POSITIVE — AB
Specific Gravity, Urine: 1.029 (ref 1.005–1.030)
pH: 5 (ref 5.0–8.0)

## 2014-01-29 LAB — I-STAT CHEM 8, ED
BUN: 17 mg/dL (ref 6–23)
Calcium, Ion: 1.13 mmol/L (ref 1.13–1.30)
Chloride: 103 mEq/L (ref 96–112)
Creatinine, Ser: 1 mg/dL (ref 0.50–1.35)
Glucose, Bld: 101 mg/dL — ABNORMAL HIGH (ref 70–99)
HCT: 36 % — ABNORMAL LOW (ref 39.0–52.0)
HEMOGLOBIN: 12.2 g/dL — AB (ref 13.0–17.0)
Potassium: 4.3 mmol/L (ref 3.5–5.1)
SODIUM: 138 mmol/L (ref 135–145)
TCO2: 22 mmol/L (ref 0–100)

## 2014-01-29 LAB — CBC
HCT: 34.5 % — ABNORMAL LOW (ref 39.0–52.0)
Hemoglobin: 11.5 g/dL — ABNORMAL LOW (ref 13.0–17.0)
MCH: 32.2 pg (ref 26.0–34.0)
MCHC: 33.3 g/dL (ref 30.0–36.0)
MCV: 96.6 fL (ref 78.0–100.0)
PLATELETS: 185 10*3/uL (ref 150–400)
RBC: 3.57 MIL/uL — ABNORMAL LOW (ref 4.22–5.81)
RDW: 13.8 % (ref 11.5–15.5)
WBC: 7.7 10*3/uL (ref 4.0–10.5)

## 2014-01-29 LAB — PROTIME-INR
INR: 2.05 — ABNORMAL HIGH (ref 0.00–1.49)
Prothrombin Time: 23.3 seconds — ABNORMAL HIGH (ref 11.6–15.2)

## 2014-01-29 LAB — URINE MICROSCOPIC-ADD ON

## 2014-01-29 LAB — HEMOGLOBIN AND HEMATOCRIT, BLOOD
HEMATOCRIT: 32.2 % — AB (ref 39.0–52.0)
Hemoglobin: 10.8 g/dL — ABNORMAL LOW (ref 13.0–17.0)

## 2014-01-29 MED ORDER — ACETAMINOPHEN 325 MG PO TABS: 650.0000 mg | ORAL_TABLET | ORAL | Status: DC | PRN

## 2014-01-29 MED ORDER — OXYCODONE-ACETAMINOPHEN 5-325 MG PO TABS: 1.0000 | ORAL_TABLET | Freq: Three times a day (TID) | ORAL | Status: DC | PRN

## 2014-01-29 MED ORDER — HYDROMORPHONE HCL 1 MG/ML IJ SOLN
0.5000 mg | Freq: Once | INTRAMUSCULAR | Status: AC
  Administered 2014-01-29: 0.5 mg via INTRAVENOUS
  Filled 2014-01-29: qty 1

## 2014-01-29 MED ORDER — HYDROMORPHONE HCL 1 MG/ML IJ SOLN: 0.5000 mg | Freq: Once | INTRAMUSCULAR | Status: DC

## 2014-01-29 MED ORDER — SODIUM CHLORIDE 0.9 % IV SOLN
INTRAVENOUS | Status: DC
  Administered 2014-01-29: 05:00:00 via INTRAVENOUS

## 2014-01-29 MED ORDER — HYDROCODONE-ACETAMINOPHEN 5-325 MG PO TABS
1.0000 | ORAL_TABLET | ORAL | Status: DC | PRN
  Administered 2014-01-29 – 2014-01-30 (×2): 2 via ORAL
  Filled 2014-01-29 (×2): qty 2

## 2014-01-29 MED ORDER — CIPROFLOXACIN HCL 500 MG PO TABS: 500.0000 mg | ORAL_TABLET | Freq: Two times a day (BID) | ORAL | Status: DC

## 2014-01-29 MED ORDER — NITROGLYCERIN 0.4 MG SL SUBL: 0.4000 mg | SUBLINGUAL_TABLET | SUBLINGUAL | Status: DC | PRN

## 2014-01-29 MED ORDER — DOCUSATE SODIUM 100 MG PO CAPS
100.0000 mg | ORAL_CAPSULE | Freq: Two times a day (BID) | ORAL | Status: DC
  Administered 2014-01-29 – 2014-01-31 (×4): 100 mg via ORAL
  Filled 2014-01-29 (×5): qty 1

## 2014-01-29 MED ORDER — ONDANSETRON HCL 4 MG/2ML IJ SOLN: 4.0000 mg | INTRAMUSCULAR | Status: DC | PRN

## 2014-01-29 MED ORDER — ATORVASTATIN CALCIUM 40 MG PO TABS
40.0000 mg | ORAL_TABLET | Freq: Every evening | ORAL | Status: DC
  Administered 2014-01-29 – 2014-01-30 (×2): 40 mg via ORAL
  Filled 2014-01-29 (×3): qty 1

## 2014-01-29 MED ORDER — FINASTERIDE 5 MG PO TABS
5.0000 mg | ORAL_TABLET | Freq: Every evening | ORAL | Status: DC
  Administered 2014-01-29 – 2014-01-30 (×2): 5 mg via ORAL
  Filled 2014-01-29 (×3): qty 1

## 2014-01-29 MED ORDER — CIPROFLOXACIN IN D5W 400 MG/200ML IV SOLN
400.0000 mg | Freq: Once | INTRAVENOUS | Status: AC
  Administered 2014-01-29: 400 mg via INTRAVENOUS
  Filled 2014-01-29: qty 200

## 2014-01-29 MED ORDER — BELLADONNA ALKALOIDS-OPIUM 16.2-60 MG RE SUPP
1.0000 | Freq: Four times a day (QID) | RECTAL | Status: DC | PRN
  Administered 2014-01-29 – 2014-01-31 (×3): 1 via RECTAL
  Filled 2014-01-29 (×3): qty 1

## 2014-01-29 MED ORDER — ALLOPURINOL 300 MG PO TABS
300.0000 mg | ORAL_TABLET | Freq: Every morning | ORAL | Status: DC
  Administered 2014-01-30 – 2014-01-31 (×2): 300 mg via ORAL
  Filled 2014-01-29 (×2): qty 1

## 2014-01-29 MED ORDER — CARVEDILOL 3.125 MG PO TABS
3.1250 mg | ORAL_TABLET | Freq: Two times a day (BID) | ORAL | Status: DC
  Administered 2014-01-30 – 2014-01-31 (×3): 3.125 mg via ORAL
  Filled 2014-01-29 (×3): qty 1

## 2014-01-29 MED ORDER — HYDROMORPHONE HCL 1 MG/ML IJ SOLN
1.0000 mg | Freq: Once | INTRAMUSCULAR | Status: AC
  Administered 2014-01-29: 1 mg via INTRAVENOUS
  Filled 2014-01-29: qty 1

## 2014-01-29 MED ORDER — SODIUM CHLORIDE 0.45 % IV SOLN
INTRAVENOUS | Status: DC
  Administered 2014-01-29 – 2014-01-30 (×3): via INTRAVENOUS

## 2014-01-29 MED ORDER — SOTALOL HCL 120 MG PO TABS
120.0000 mg | ORAL_TABLET | Freq: Two times a day (BID) | ORAL | Status: DC
  Administered 2014-01-29 – 2014-01-31 (×4): 120 mg via ORAL
  Filled 2014-01-29 (×5): qty 1

## 2014-01-29 MED ORDER — LISINOPRIL 20 MG PO TABS
20.0000 mg | ORAL_TABLET | Freq: Every day | ORAL | Status: DC
  Administered 2014-01-29 – 2014-01-30 (×2): 20 mg via ORAL
  Filled 2014-01-29 (×3): qty 1

## 2014-01-29 NOTE — Consult Note (Signed)
Urology Consult  Referring physician: Kirby Funk Reason for referral: Clot retention  Chief Complaint: Clot retention  History of Present Illness: TURP Dr Risa Grill in November; clot retention issues over last 24 hours; cannot irrigate catheter; on coumadin; had catheter and wah out by local urologist and went home without Roth catheter early this week; INR 2 today; never filled cipro rx;  Minimal abdominal pain and trouble urinating Modifying factors: There are no other modifying factors  Associated signs and symptoms: There are no other associated signs and symptoms Aggravating and relieving factors: There are no other aggravating or relieving factors Severity: Moderate Duration: Persistent   Past Medical History  Diagnosis Date  . Atrial fibrillation/flutter   . 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 Sinus node dysfunction     post termination pauses <5sec  . Hyperlipidemia   . Hypertension   . Central obesity   . Gout     "once; I have it under control w/medicine"  . BPH (benign prostatic hyperplasia)   . Chronic back pain   . Angina   . Arthritis     "in my back"  . Pacemaker    Past Surgical History  Procedure Laterality Date  . Cholecystectomy  1978   . Cardiac catheterization  2005    "that what sent me to CABG"  . Cataract extraction w/ intraocular lens  implant, bilateral  ~ 01/2011  . Coronary artery bypass graft  2005    in [place] - LIMA to LAD, SVG to diagonal, SVG to ramus, SVG to OM and SVG to RCA   . Pacemaker insertion  2014  . Transurethral resection of prostate N/Roth 12/29/2013    Procedure: TRANSURETHRAL RESECTION OF THE PROSTATE WITH GYRUS INSTRUMENTS;  Surgeon: [name], MD;  Location: [facility];  Service: Urology;  Laterality: N/Roth;  . Permanent pacemaker insertion N/Roth 05/25/2011    Procedure: PERMANENT PACEMAKER INSERTION;  Surgeon: [name], MD;  Location: [facility] C[facility] LAB;  Service: Cardiovascular;  Laterality: N/Roth;    Medications: I have reviewed the patient's current medications. Allergies:  Allergies  Allergen Reactions  . Sulfur     SWELLING ON PATIENTS LIPS, HE GETS DRIED UP    Family History  Problem Relation Age of Onset  . Cancer Mother    Social History:  reports that he quit smoking about 53 years ago. His smoking use included Cigarettes. He smoked 0.00 packs per day for 0 years. He has never used smokeless tobacco. He reports that he does not drink alcohol or use illicit drugs.  ROS: All systems are reviewed and negative except as noted. Rest negative  Physical Exam:  Vital signs in last 24 hours: Temp:  [98.3 F (36.8 C)] 98.3 F (36.8 C) (12/31 0206) Pulse Rate:  [60-75] 60 (12/31 0552) Resp:  [15-18] 15 (12/31 0552) BP: (93-119)/(52-73) 100/65 mmHg (12/31 0552) SpO2:  [95 %-97 %] 97 % (12/31 0552) Weight:  [90.719 kg (200 lb)] 90.719 kg (200 lb) (12/31 0206)  Cardiovascular: Skin warm; not flushed Respiratory: Breaths quiet; no shortness of breath Abdomen: No masses Neurological: Normal sensation to touch Musculoskeletal: Normal motor function arms and legs Lymphatics: No inguinal adenopathy Skin: No rashes Genitourinary:modest s/p distension/non-toxic; genitalia normal/ non-draining 18 Fr catheter  Laboratory Data:  Results for orders placed or performed during the hospital encounter of 01/29/14 (from  the past 72 hour(s))  CBC (if pt has Roth temp above 100.46F)     Status: Abnormal   Collection Time: 01/29/14  2:53 AM  Result Value Ref Range   WBC 7.7 4.0 - 10.5 K/uL   RBC 3.57 (L) 4.22 - 5.81 MIL/uL   Hemoglobin 11.5 (L) 13.0 - 17.0 g/dL   HCT 34.5 (L) 39.0 - 52.0 %   MCV 96.6 78.0 - 100.0 fL   MCH 32.2 26.0 - 34.0 pg   MCHC 33.3 30.0 - 36.0 g/dL   RDW 13.8 11.5 - 15.5 %   Platelets 185 150 - 400 K/uL  U/Roth (may I&O cath if menses)     Status: Abnormal   Collection Time: 01/29/14   3:02 AM  Result Value Ref Range   Color, Urine RED (Roth) YELLOW    Comment: BIOCHEMICALS MAY BE AFFECTED BY COLOR   APPearance TURBID (Roth) CLEAR   Specific Gravity, Urine 1.029 1.005 - 1.030   pH 5.0 5.0 - 8.0   Glucose, UA 250 (Roth) NEGATIVE mg/dL   Hgb urine dipstick LARGE (Roth) NEGATIVE   Bilirubin Urine LARGE (Roth) NEGATIVE   Ketones, ur >80 (Roth) NEGATIVE mg/dL   Protein, ur >300 (Roth) NEGATIVE mg/dL   Urobilinogen, UA >8.0 (H) 0.0 - 1.0 mg/dL   Nitrite POSITIVE (Roth) NEGATIVE   Leukocytes, UA LARGE (Roth) NEGATIVE  Urine microscopic-add on     Status: None   Collection Time: 01/29/14  3:02 AM  Result Value Ref Range   RBC / HPF TOO NUMEROUS TO COUNT <3 RBC/hpf   Urine-Other LESS THAN 10 mL OF URINE SUBMITTED     Comment: URINALYSIS PERFORMED ON SUPERNATANT FIELD OBSCURED BY RBC'S   I-Stat Chem 8, ED     Status: Abnormal   Collection Time: 01/29/14  3:04 AM  Result Value Ref Range   Sodium 138 135 - 145 mmol/L   Potassium 4.3 3.5 - 5.1 mmol/L   Chloride 103 96 - 112 mEq/L   BUN 17 6 - 23 mg/dL   Creatinine, Ser 1.00 0.50 - 1.35 mg/dL   Glucose, Bld 101 (H) 70 - 99 mg/dL   Calcium, Ion 1.13 1.13 - 1.30 mmol/L   TCO2 22 0 - 100 mmol/L   Hemoglobin 12.2 (L) 13.0 - 17.0 g/dL   HCT 36.0 (L) 39.0 - 52.0 %  Protime-INR     Status: Abnormal   Collection Time: 01/29/14  5:13 AM  Result Value Ref Range   Prothrombin Time 23.3 (H) 11.6 - 15.2 seconds   INR 2.05 (H) 0.00 - 1.49   No results found for this or any previous visit (from the past 240 hour(s)). Creatinine:  Recent Labs  01/29/14 0304  CREATININE 1.00    Xrays: See report/chart none  Impression/Assessment:  Clot retention secondary to TURP last month/coumadin  Plan:  22 Fr foley inserted; irrigated clots; modest amount; flat abdomen; clear urine/pink Home with foley To see Dr Risa Grill with his appointment later this am  Dylan Roth 01/29/2014, 6:32 AM

## 2014-01-29 NOTE — Discharge Instructions (Signed)
Please be sure to follow up with Dr. Risa Grill.  Return here for any concerning changes in your condition.   Acute Urinary Retention Acute urinary retention is the temporary inability to urinate. This is a common problem in older men. As men age their prostates become larger and block the flow of urine from the bladder. This is usually a problem that has come on gradually.  HOME CARE INSTRUCTIONS If you are sent home with a Foley catheter and a drainage system, you will need to discuss the best course of action with your health care provider. While the catheter is in, maintain a good intake of fluids. Keep the drainage bag emptied and lower than your catheter. This is so that contaminated urine will not flow back into your bladder, which could lead to a urinary tract infection. There are two main types of drainage bags. One is a large bag that usually is used at night. It has a good capacity that will allow you to sleep through the night without having to empty it. The second type is called a leg bag. It has a smaller capacity, so it needs to be emptied more frequently. However, the main advantage is that it can be attached by a leg strap and can go underneath your clothing, allowing you the freedom to move about or leave your home. Only take over-the-counter or prescription medicines for pain, discomfort, or fever as directed by your health care provider.  SEEK MEDICAL CARE IF:  You develop a low-grade fever.  You experience spasms or leakage of urine with the spasms. SEEK IMMEDIATE MEDICAL CARE IF:   You develop chills or fever.  Your catheter stops draining urine.  Your catheter falls out.  You start to develop increased bleeding that does not respond to rest and increased fluid intake. MAKE SURE YOU:  Understand these instructions.  Will watch your condition.  Will get help right away if you are not doing well or get worse. Document Released: 04/24/2000 Document Revised: 01/21/2013  Document Reviewed: 06/27/2012 [facility] Patient Information 2015 [place], [place]. This information is not intended to replace advice given to you by your health care provider. Make sure you discuss any questions you have with your health care provider.

## 2014-01-29 NOTE — ED Notes (Addendum)
Pt reports that he was seen last night at [facility] for urinary retention. Pt has a TURP by Dr. Risa Grill on 11/30 and for 3 weeks did not have any difficulty urinating until yesterday.  Pt has an appointment today with Dr. Risa Grill, but at 0100 pt passed a large clot, golf ball sized, through his urethra. Pt states he continues to have urinary retention since that time. Pt had to have a a catheter placed by urologist d/t difficulty inserting, and stated that he was irrigated until clear, and was discharged home with follow-up appointment. Pt is currently on Coumadin

## 2014-01-29 NOTE — H&P (Signed)
Urology History and Physical Exam  CC: Blood in urine  HPI: 73 year old male , patient of Dr. Cy Blamer who had a TURP about a month ago, presented to the emergency room at [facility] this morning with gross hematuria and clot retention. A catheter was placed. The patient had his catheter irrigated, and was sent to our office for follow-up.  Because of the large volume of clots that I irrigated from the patient in the office (with approximately 3000 mL of saline), I am admitting him for bladder irrigation and observation.   PMH: Past Medical History  Diagnosis Date  . Atrial fibrillation/flutter   . 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 Sinus node dysfunction     post termination pauses <5sec  . Hyperlipidemia   . Hypertension   . Central obesity   . Gout     "once; I have it under control w/medicine"  . BPH (benign prostatic hyperplasia)   . Chronic back pain   . Angina   . Arthritis     "in my back"  . Pacemaker     PSH: Past Surgical History  Procedure Laterality Date  . Cholecystectomy  1978   . Cardiac catheterization  2005    "that what sent me to CABG"  . Cataract extraction w/ intraocular lens  implant, bilateral  ~ 01/2011  . Coronary artery bypass graft  2005    in [place] - LIMA to LAD, SVG to diagonal, SVG to ramus, SVG to OM and SVG to RCA   . Pacemaker insertion  2014  . Transurethral resection of prostate N/A 12/29/2013    Procedure: TRANSURETHRAL RESECTION OF THE PROSTATE WITH GYRUS INSTRUMENTS;  Surgeon: [name], MD;  Location: [facility];  Service: Urology;  Laterality: N/A;  . Permanent pacemaker insertion N/A 05/25/2011    Procedure: PERMANENT PACEMAKER INSERTION;  Surgeon: [name], MD;  Location: [facility] CATH LAB;  Service: Cardiovascular;  Laterality: N/A;    Allergies: Allergies  Allergen Reactions  . Sulfur     SWELLING ON  PATIENTS LIPS, HE GETS DRIED UP    Medications: Prescriptions prior to admission  Medication Sig Dispense Refill Last Dose  . allopurinol (ZYLOPRIM) 300 MG tablet Take 300 mg by mouth every morning.    01/28/2014 at Unknown time  . atorvastatin (LIPITOR) 40 MG tablet Take 40 mg by mouth every evening.   01/28/2014 at Unknown time  . carvedilol (COREG) 3.125 MG tablet Take 1 tablet (3.125 mg total) by mouth 2 (two) times daily with a meal. 60 tablet 11 01/28/2014 at 1900  . finasteride (PROSCAR) 5 MG tablet Take 5 mg by mouth every evening.    01/28/2014 at Unknown time  . lisinopril (PRINIVIL,ZESTRIL) 20 MG tablet Take 20 mg by mouth at bedtime.    01/28/2014 at Unknown time  . nitroGLYCERIN (NITROSTAT) 0.4 MG SL tablet Place 1 tablet (0.4 mg total) under the tongue every 5 (five) minutes as needed. For chest pain 25 tablet 3 unknown  . sotalol (BETAPACE) 120 MG tablet TAKE ONE TABLET BY MOUTH EVERY 12 HOURS 180 tablet 1 01/28/2014 at 1900  . Tamsulosin HCl (FLOMAX) 0.4 MG CAPS Take 0.4 mg by mouth 2 (two) times daily.    01/28/2014 at Unknown time  . warfarin (COUMADIN) 5 MG tablet Take 5 mg by mouth every evening.  5 mg on Monday and Friday, then 2.5 mg on all other days.   01/28/2014 at Unknown time  . ciprofloxacin (CIPRO) 500 MG tablet Take 1 tablet (500 mg total) by mouth 2 (two) times daily. 10 tablet 0 not yet started  . ondansetron (ZOFRAN) 4 MG/2ML SOLN injection Inject 2 mLs (4 mg total) into the vein every 4 (four) hours as needed for nausea or vomiting. (Patient not taking: Reported on 01/29/2014) 2 mL 0 Completed Course at Unknown time  . oxyCODONE-acetaminophen (PERCOCET/ROXICET) 5-325 MG per tablet Take 1 tablet by mouth every 8 (eight) hours as needed for severe pain. (Patient not taking: Reported on 01/29/2014) 15 tablet 0 Not Taking at Unknown time     Social History: History   Social History  . Marital Status: Married    Spouse Name: N/A    Number of Children: 2  .  Years of Education: N/A   Occupational History  . retired-truck driver   .     Social History Main Topics  . Smoking status: Former Smoker -- 0.00 packs/day for 0 years    Types: Cigarettes    Quit date: 01/30/1961  . Smokeless tobacco: Never Used  . Alcohol Use: No     Comment: 2 drinks (bourbon) a night  . Drug Use: No  . Sexual Activity: Yes   Other Topics Concern  . Not on file   Social History Narrative   Married. Pt works part-time for a [employer]. Quit tobacco 50 years ago..Both parents are deceased, neither one had cardiac issues and no siblings have cardiac issues.     Family History: Family History  Problem Relation Age of Onset  . Cancer Mother     Review of Systems: Positive:  gross hematuria with clots, bladder pain, leakage Negative:  no fever, chills, nausea or vomiting.  A further 10 point review of systems was negative except what is listed in the HPI.                  Physical Exam: @VITALS2 @ General: No acute distress.  Awake. Head:  Normocephalic.  Atraumatic. ENT:  EOMI.  Mucous membranes moist Neck:  Supple.  No lymphadenopathy. CV:  S1 present. S2 present. Regular rate. Pulmonary: Equal effort bilaterally.  Clear to auscultation bilaterally. Abdomen: Soft.   Non- tender to palpation. there was suprapubic tenderness.  Skin:  Normal turgor.  No visible rash. Extremity: No gross deformity of bilateral upper extremities.  No gross deformity of                             lower extremities. Neurologic: Alert. Appropriate mood.  Penis:  circumcised.  No lesions. Urethra: Orthotopic meatus. Foley catheter was in place. Blood was coming from around the catheter at the meatus.  Scrotum: No lesions.  No ecchymosis.  No erythema. Testicles: Descended bilaterally.  No masses bilaterally. Epididymis: Palpable bilaterally. Nontender to palpation.  Studies:  Recent Labs     01/29/14  0253  01/29/14  0304  01/29/14  1339  HGB  11.5*  12.2*  10.8*   WBC  7.7   --    --   PLT  185   --    --     Recent Labs     01/29/14  0304  NA  138  K  4.3  CL  103  BUN  17  CREATININE  1.00     Recent  Labs     01/29/14  0513  INR  2.05*     Invalid input(s): ABG   in the office, his bladder was irrigated with 3000 mL of saline until clear  Assessment:   Postop bleeding a month out from a TURP, the patient is on Coumadin  Plan: the patient will be on when necessary irrigation. We will stop his Coumadin. I imagine he'll be here a day or 2 until his bleeding stops.

## 2014-01-29 NOTE — ED Provider Notes (Signed)
CSN: [id number]     Arrival date & time 01/29/14  0144 History   First MD Initiated Contact with Patient 01/29/14 [phone]     Chief Complaint  Patient presents with  . Urinary Retention     (Consider location/radiation/quality/duration/timing/severity/associated sxs/prior Treatment) HPI Patient presents with inability to urinate. Patient had TURP procedure one month ago. Subsequent, the patient has had one episode of urinary retention, but this cleared after catheter placement, irrigation, 4 days ago. Tonight, several hours, the patient noticed inability to urinate. He did produce several blood clots, but no ongoing urine. There is mild lower abdominal discomfort, no abdominal pain. No other complaints, such as fever, chills, chest pain, dyspnea. No attempts at medication for relief of his abdominal pain.  Past Medical History  Diagnosis Date  . Atrial fibrillation/flutter   . 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 Sinus node dysfunction     post termination pauses <5sec  . Hyperlipidemia   . Hypertension   . Central obesity   . Gout     "once; I have it under control w/medicine"  . BPH (benign prostatic hyperplasia)   . Chronic back pain   . Angina   . Arthritis     "in my back"  . Pacemaker    Past Surgical History  Procedure Laterality Date  . Cholecystectomy  1978   . Cardiac catheterization  2005    "that what sent me to CABG"  . Cataract extraction w/ intraocular lens  implant, bilateral  ~ 01/2011  . Coronary artery bypass graft  2005    in [place] - LIMA to LAD, SVG to diagonal, SVG to ramus, SVG to OM and SVG to RCA   . Pacemaker insertion  2014  . Transurethral resection of prostate N/A 12/29/2013    Procedure: TRANSURETHRAL RESECTION OF THE PROSTATE WITH GYRUS INSTRUMENTS;  Surgeon: [name], MD;  Location: [facility];  Service: Urology;  Laterality:  N/A;  . Permanent pacemaker insertion N/A 05/25/2011    Procedure: PERMANENT PACEMAKER INSERTION;  Surgeon: [name], MD;  Location: [facility] CATH LAB;  Service: Cardiovascular;  Laterality: N/A;   Family History  Problem Relation Age of Onset  . Cancer Mother    History  Substance Use Topics  . Smoking status: Former Smoker -- 0.00 packs/day for 0 years    Types: Cigarettes    Quit date: 01/30/1961  . Smokeless tobacco: Never Used  . Alcohol Use: No     Comment: 2 drinks (bourbon) a night    Review of Systems  Constitutional:       Per HPI, otherwise negative  HENT:       Per HPI, otherwise negative  Respiratory:       Per HPI, otherwise negative  Cardiovascular:       Per HPI, otherwise negative  Gastrointestinal: Negative for vomiting.  Endocrine:       Negative aside from HPI  Genitourinary:       Neg aside from HPI   Musculoskeletal:       Per HPI, otherwise negative  Skin: Negative.   Neurological: Negative for syncope.      Allergies  Sulfur  Home Medications   Prior to Admission medications   Medication Sig Start Date End Date Taking? Authorizing Provider  allopurinol (ZYLOPRIM) 300 MG tablet Take 300 mg by mouth  every morning.    Yes Historical Provider, MD  atorvastatin (LIPITOR) 40 MG tablet Take 40 mg by mouth every evening.   Yes Historical Provider, MD  carvedilol (COREG) 3.125 MG tablet Take 1 tablet (3.125 mg total) by mouth 2 (two) times daily with a meal. 09/04/13  Yes Burnell Blanks, MD  lisinopril (PRINIVIL,ZESTRIL) 20 MG tablet Take 20 mg by mouth at bedtime.    Yes Historical Provider, MD  nitroGLYCERIN (NITROSTAT) 0.4 MG SL tablet Place 1 tablet (0.4 mg total) under the tongue every 5 (five) minutes as needed. For chest pain 01/29/13  Yes Burnell Blanks, MD  sotalol (BETAPACE) 120 MG tablet TAKE ONE TABLET BY MOUTH EVERY 12 HOURS 09/23/13  Yes Burnell Blanks, MD  Tamsulosin HCl (FLOMAX) 0.4 MG CAPS Take 0.4 mg by mouth 2  (two) times daily.    Yes Historical Provider, MD  warfarin (COUMADIN) 5 MG tablet Take 5 mg by mouth every evening. 5 mg on Monday and Friday, then 2.5 mg on all other days. 01/08/14  Yes Historical Provider, MD  ciprofloxacin (CIPRO) 500 MG tablet Take 1 tablet (500 mg total) by mouth 2 (two) times daily. 12/30/13   [name], MD  finasteride (PROSCAR) 5 MG tablet Take 5 mg by mouth every evening.  11/28/11   Historical Provider, MD  ondansetron (ZOFRAN) 4 MG/2ML SOLN injection Inject 2 mLs (4 mg total) into the vein every 4 (four) hours as needed for nausea or vomiting. 12/30/13   [name], MD  oxyCODONE-acetaminophen (PERCOCET/ROXICET) 5-325 MG per tablet Take 1-2 tablets by mouth every 4 (four) hours as needed for moderate pain. 12/30/13   [name], MD   BP 119/73 mmHg  Pulse 66  Temp(Src) 98.3 F (36.8 C) (Oral)  Resp 18  Ht 5\' 6"  (1.676 m)  Wt 200 lb (90.719 kg)  BMI 32.30 kg/m2  SpO2 95% Physical Exam  Constitutional: He is oriented to person, place, and time. He appears well-developed. No distress.  HENT:  Head: Normocephalic and atraumatic.  Eyes: Conjunctivae and EOM are normal.  Cardiovascular: Normal rate and regular rhythm.   Pulmonary/Chest: Effort normal. No stridor. No respiratory distress.  Abdominal: He exhibits no distension.  Mild tenderness to palpation about the lower abdomen, no guarding, rebound.  Musculoskeletal: He exhibits no edema.  Neurological: He is alert and oriented to person, place, and time.  Skin: Skin is warm and dry.  Psychiatric: He has a normal mood and affect.  Nursing note and vitals reviewed.   ED Course  Procedures (including critical care time) Labs Review Labs Reviewed  CBC - Abnormal; Notable for the following:    RBC 3.57 (*)    Hemoglobin 11.5 (*)    HCT 34.5 (*)    All other components within normal limits  I-STAT CHEM 8, ED - Abnormal; Notable for the following:    Glucose, Bld 101 (*)    Hemoglobin 12.2 (*)     HCT 36.0 (*)    All other components within normal limits  URINALYSIS, ROUTINE W REFLEX MICROSCOPIC     EKG Interpretation   Date/Time:  Thursday January 29 2014 05:45:52 EST Ventricular Rate:  60 PR Interval:  156 QRS Duration: 101 QT Interval:  428 QTC Calculation: 428 R Axis:   31 Text Interpretation:  Atrial-paced rhythm Abnormal T, consider ischemia,  anterior leads Atrial-paced rhythm Abnormal ekg Confirmed by Carmin Muskrat  MD ([id number]) on 01/29/2014 6:02:44 AM  5:10 AM Foley in place.  Patient is not currently taking his cipro.  7:12 AM Patient had successful irrigation with our urology colleague.  MDM   Patient presents with acute urinary retention. Patient Foley catheter placed successfully, but attempts to irrigate the bladder were not successful. Patient had production of substantial blood and clots through his catheter. With the assistance of urology, patient had clearing of his urine.   He had good pain control here.  He has f/u in four hours.     Carmin Muskrat, MD 01/29/14 [phone]

## 2014-01-29 NOTE — Progress Notes (Signed)
Patient c/o pain in penis and abdomen.  Catheter appeared to be draining fine with no clots, and bladder scan showed <39ml.  Patient stated he has been straining to push urine out, quite hard, because he has to do this at home and 'sometimes clots fly out'.  Educated patient about foley catheter and that urine will drain without him straining.  Administered B&O suppository and Norco to help patient with discomfort.  Will continue to monitor.

## 2014-01-29 NOTE — ED Notes (Signed)
Catheter irrigated with 60cc of sterile water.

## 2014-01-29 NOTE — ED Notes (Signed)
Bed: WTR5 Expected date:  Expected time:  Means of arrival:  Comments: 

## 2014-01-30 ENCOUNTER — Other Ambulatory Visit: Payer: Self-pay | Admitting: Cardiovascular Disease

## 2014-01-30 MED ORDER — DSS 100 MG PO CAPS: 100.0000 mg | ORAL_CAPSULE | Freq: Two times a day (BID) | ORAL | Status: DC

## 2014-01-30 NOTE — Progress Notes (Signed)
  Subjective: Patient reports feeling of incomplete emptying/bladder spasms. Bladder scan last night during complaints revealed an empty bladder. His catheter has not been irrigated since he was admitted.  Objective: Vital signs in last 24 hours: Temp:  [97.5 F (36.4 C)-97.7 F (36.5 C)] 97.7 F (36.5 C) (01/01 0509) Pulse Rate:  [61-72] 72 (01/01 0509) Resp:  [16-20] 20 (01/01 0509) BP: (100-141)/(72-82) 115/72 mmHg (01/01 0509) SpO2:  [98 %-100 %] 100 % (01/01 0509) Weight:  [90.583 kg (199 lb 11.2 oz)] 90.583 kg (199 lb 11.2 oz) (12/31 1308)  Intake/Output from previous day: 12/31 0701 - 01/01 0700 In: 1758.8 [P.O.:600; I.V.:1158.8] Out: 2420 [Urine:2420] Intake/Output this shift:    Physical Exam:  Constitutional: Vital signs reviewed. WD WN in NAD   Eyes: PERRL, No scleral icterus.   Pulmonary/Chest: Normal effort Abdominal: Soft. Non-tender, non-distended, bowel sounds are normal, no masses, organomegaly, or guarding present.   His catheter was irrigated with 500 mL of saline. Very few clots were liberated. A fluid was totally clear following irrigation. Lab Results:  Recent Labs  01/29/14 0253 01/29/14 0304 01/29/14 1339  HGB 11.5* 12.2* 10.8*  HCT 34.5* 36.0* 32.2*   BMET  Recent Labs  01/29/14 0304  NA 138  K 4.3  CL 103  GLUCOSE 101*  BUN 17  CREATININE 1.00    Recent Labs  01/29/14 0513  INR 2.05*   No results for input(s): LABURIN in the last 72 hours. No results found for this or any previous visit.  Studies/Results: No results found.  Assessment/Plan:   Recurrent hematuria/clot retention, currently managed with indwelling catheter. Despite him not having bladder irrigation over the evening, there are very few clots present.    I will change his order to have bladder irrigations every 4 hours. I have reassured him about his bladder spasms. He does have B and O suppositories ordered.    I have written an order to have the  catheter discontinued in the morning at 5. If he voids okay, I think it okay to have him discharged with follow-up by Dr. Risa Grill. I have recommended that he stay on finasteride which may limit the hematuria in the near term.   LOS: 1 day   Franchot Gallo M 01/30/2014, 7:23 AM

## 2014-01-31 MED ORDER — DOXYCYCLINE HYCLATE 100 MG PO TABS: 100.0000 mg | ORAL_TABLET | Freq: Two times a day (BID) | ORAL | Status: DC

## 2014-01-31 NOTE — Progress Notes (Signed)
CARE MANAGEMENT NOTE 01/31/2014  Patient:  ULIS, KAPS   Account Number:  [id number]  Date Initiated:  01/31/2014  Documentation initiated by:  [facility]  Subjective/Objective Assessment:     Action/Plan:   Anticipated DC Date:  01/31/2014   Anticipated DC Plan:  [place]  CM consult      Choice offered to / List presented to:             Status of service:  Completed, signed off Medicare Important Message given?  NA - LOS <3 / Initial given by admissions (If response is "NO", the following Medicare IM given date fields will be blank) Date Medicare IM given:   Medicare IM given by:   Date Additional Medicare IM given:   Additional Medicare IM given by:    Discharge Disposition:  HOME/SELF CARE  Per UR Regulation:    If discussed at Long Length of Stay Meetings, dates discussed:    Comments:  01/31/2014 1000 NCM spoke to pt and no NCM needs identified. Chart reviewed. Jonnie Finner RN CCM Case Mgmt phone [phone]

## 2014-01-31 NOTE — Discharge Summary (Signed)
Patient ID: Dylan Roth MRN: [id number] DOB/AGE: 73/73/1942 73 y.o.  Admit date: 01/29/2014 Discharge date: 01/31/2014  Primary Care Physician:  [facility]  Discharge Diagnoses:   Present on Admission:  . Hematuria, gross  Consults:  None   Discharge Medications:   Medication List    STOP taking these medications        ciprofloxacin 500 MG tablet  Commonly known as:  CIPRO     FLOMAX 0.4 MG Caps capsule  Generic drug:  tamsulosin     ondansetron 4 MG/2ML Soln injection  Commonly known as:  ZOFRAN     warfarin 5 MG tablet  Commonly known as:  COUMADIN      TAKE these medications        allopurinol 300 MG tablet  Commonly known as:  ZYLOPRIM  Take 300 mg by mouth every morning.     atorvastatin 40 MG tablet  Commonly known as:  LIPITOR  Take 40 mg by mouth every evening.     carvedilol 3.125 MG tablet  Commonly known as:  COREG  Take 1 tablet (3.125 mg total) by mouth 2 (two) times daily with a meal.     doxycycline 100 MG tablet  Commonly known as:  VIBRA-TABS  Take 1 tablet (100 mg total) by mouth 2 (two) times daily.     DSS 100 MG Caps  Take 100 mg by mouth 2 (two) times daily.     finasteride 5 MG tablet  Commonly known as:  PROSCAR  Take 5 mg by mouth every evening.     lisinopril 20 MG tablet  Commonly known as:  PRINIVIL,ZESTRIL  Take 20 mg by mouth at bedtime.     nitroGLYCERIN 0.4 MG SL tablet  Commonly known as:  NITROSTAT  Place 1 tablet (0.4 mg total) under the tongue every 5 (five) minutes as needed. For chest pain     oxyCODONE-acetaminophen 5-325 MG per tablet  Commonly known as:  PERCOCET/ROXICET  Take 1 tablet by mouth every 8 (eight) hours as needed for severe pain.     sotalol 120 MG tablet  Commonly known as:  BETAPACE  TAKE ONE TABLET BY MOUTH EVERY 12 HOURS         Significant Diagnostic Studies:  No results found.  Brief H and P: For complete details please refer to admission H and P, but in brief the  patient was admitted because of persistent bleeding/clot retention about a month after his TURP. The patient was on anticoagulants.  Hospital Course:  Active Problems:   Hematuria, gross  He was admitted from our office following bladder irrigation. He was taken off of his Coumadin. With proper bladder irrigation and stopping the Coumadin, his urine cleared. After his catheter was discontinued on January 2, he voided adequately and was discharged. Day of Discharge BP 133/70 mmHg  Pulse 72  Temp(Src) 97.6 F (36.4 C) (Oral)  Resp 18  Ht 5\' 6"  (1.676 m)  Wt 90.583 kg (199 lb 11.2 oz)  BMI 32.25 kg/m2  SpO2 98%  No results found for this or any previous visit (from the past 24 hour(s)).  Physical Exam: General: Alert and awake oriented x3 not in any acute distress. HEENT: anicteric sclera, pupils reactive to light and accommodation CVS: S1-S2 clear no murmur rubs or gallops Chest: clear to auscultation bilaterally, no wheezing rales or rhonchi Abdomen: soft nontender, nondistended, normal bowel sounds, no organomegaly Extremities: no cyanosis, clubbing or edema noted bilaterally Neuro: Cranial nerves II-XII intact,  no focal neurological deficits  Disposition:  Home  Diet:  No restrictions  Activity:  Discussed with patient   Disposition and Follow-up:     Discharge Instructions    Discharge patient    Complete by:  As directed             He will follow-up with Dr. Risa Grill is scheduled  TESTS THAT NEED FOLLOW-UP  None  DISCHARGE FOLLOW-UP Follow-up Information    Follow up with [name], MD.   Specialty:  Urology   Why:  Call for appointment to be seen within the next 2-3 weeks   Contact information:   [address] [place] [id number] [phone]       Time spent on Discharge:  15 minutes  Signed: Jorja Loa 01/31/2014, 6:51 AM

## 2014-01-31 NOTE — Discharge Instructions (Signed)
1. Drink plenty of fluids to keep your urinary output up  2. It is okay to continue the finasteride over the short-term. You do not need to take the tamsulosin  3. Limit your exertional activity for the next 2-3 weeks  4. Call Dr. Cy Blamer office to set up a follow-up appointment within the next 2-3 weeks.  5. It is okay to restart the Coumadin in a week if your urine is still clear.

## 2014-02-01 LAB — URINE CULTURE
CULTURE: NO GROWTH
Colony Count: NO GROWTH

## 2014-02-02 ENCOUNTER — Telehealth: Payer: Self-pay | Admitting: Cardiovascular Disease

## 2014-02-02 NOTE — Telephone Encounter (Signed)
Routing to Coumadin clinic to be addressed.

## 2014-02-02 NOTE — Telephone Encounter (Signed)
New Message  Pt wanted to speak with Rn about how he was off Coumadin for a period of time recently. Pt has appt with [facility] in March. Pt said he was comfortable waiting that long but wanted to speak with rn to be sure. Please call back and discuss.

## 2014-02-02 NOTE — Telephone Encounter (Signed)
Spoke with pt in regard to recent hospital admit and that Dr Romilda Garret (Urologist) has him off Coumadin until 02/07/14 due to hematuria. Appt scheduled to follow up after he restarts.

## 2014-02-04 ENCOUNTER — Ambulatory Visit (INDEPENDENT_AMBULATORY_CARE_PROVIDER_SITE_OTHER): Payer: Medicare HMO | Admitting: *Deleted

## 2014-02-04 DIAGNOSIS — I495 Sick sinus syndrome: Secondary | ICD-10-CM

## 2014-02-04 NOTE — Progress Notes (Signed)
Remote pacemaker transmission.   

## 2014-02-06 LAB — MDC_IDC_ENUM_SESS_TYPE_REMOTE
Brady Statistic AP VS Percent: 39 %
Brady Statistic AS VP Percent: 1 %
Brady Statistic RA Percent Paced: 94 %
Brady Statistic RV Percent Paced: 56 %
Implantable Pulse Generator Serial Number: 7322329
Lead Channel Pacing Threshold Pulse Width: 0.4 ms
Lead Channel Pacing Threshold Pulse Width: 0.4 ms
Lead Channel Sensing Intrinsic Amplitude: 7.7 mV
MDC IDC MSMT BATTERY REMAINING LONGEVITY: 95 mo
MDC IDC MSMT BATTERY REMAINING PERCENTAGE: 74 %
MDC IDC MSMT BATTERY VOLTAGE: 2.95 V
MDC IDC MSMT LEADCHNL RA IMPEDANCE VALUE: 410 Ohm
MDC IDC MSMT LEADCHNL RA PACING THRESHOLD AMPLITUDE: 0.5 V
MDC IDC MSMT LEADCHNL RA SENSING INTR AMPL: 1.9 mV
MDC IDC MSMT LEADCHNL RV IMPEDANCE VALUE: 400 Ohm
MDC IDC MSMT LEADCHNL RV PACING THRESHOLD AMPLITUDE: 1 V
MDC IDC SESS DTM: 20160106134400
MDC IDC SET LEADCHNL RA PACING AMPLITUDE: 1.5 V
MDC IDC SET LEADCHNL RV PACING AMPLITUDE: 1.25 V
MDC IDC SET LEADCHNL RV PACING PULSEWIDTH: 0.4 ms
MDC IDC SET LEADCHNL RV SENSING SENSITIVITY: 2 mV
MDC IDC STAT BRADY AP VP PERCENT: 55 %
MDC IDC STAT BRADY AS VS PERCENT: 5.1 %

## 2014-02-13 ENCOUNTER — Ambulatory Visit (INDEPENDENT_AMBULATORY_CARE_PROVIDER_SITE_OTHER): Payer: Medicare HMO | Admitting: *Deleted

## 2014-02-13 DIAGNOSIS — Z7901 Long term (current) use of anticoagulants: Secondary | ICD-10-CM

## 2014-02-13 DIAGNOSIS — I4891 Unspecified atrial fibrillation: Secondary | ICD-10-CM

## 2014-02-13 LAB — POCT INR: INR: 1.3

## 2014-02-21 ENCOUNTER — Other Ambulatory Visit: Payer: Self-pay | Admitting: Cardiovascular Disease

## 2014-02-26 ENCOUNTER — Ambulatory Visit (INDEPENDENT_AMBULATORY_CARE_PROVIDER_SITE_OTHER): Payer: Medicare HMO | Admitting: *Deleted

## 2014-02-26 DIAGNOSIS — Z7901 Long term (current) use of anticoagulants: Secondary | ICD-10-CM | POA: Diagnosis not present

## 2014-02-26 DIAGNOSIS — I4891 Unspecified atrial fibrillation: Secondary | ICD-10-CM

## 2014-02-26 LAB — POCT INR: INR: 2.1

## 2014-04-02 ENCOUNTER — Other Ambulatory Visit: Payer: Self-pay | Admitting: Cardiovascular Disease

## 2014-04-02 ENCOUNTER — Ambulatory Visit (INDEPENDENT_AMBULATORY_CARE_PROVIDER_SITE_OTHER): Payer: Medicare HMO | Admitting: *Deleted

## 2014-04-02 ENCOUNTER — Encounter: Payer: Self-pay | Admitting: Cardiovascular Disease

## 2014-04-02 ENCOUNTER — Ambulatory Visit (INDEPENDENT_AMBULATORY_CARE_PROVIDER_SITE_OTHER): Payer: Medicare HMO | Admitting: Cardiovascular Disease

## 2014-04-02 VITALS — BP 142/100 | HR 70 | Ht 66.0 in | Wt 200.0 lb

## 2014-04-02 DIAGNOSIS — I480 Paroxysmal atrial fibrillation: Secondary | ICD-10-CM

## 2014-04-02 DIAGNOSIS — I2510 Atherosclerotic heart disease of native coronary artery without angina pectoris: Secondary | ICD-10-CM | POA: Diagnosis not present

## 2014-04-02 DIAGNOSIS — I10 Essential (primary) hypertension: Secondary | ICD-10-CM

## 2014-04-02 DIAGNOSIS — I4891 Unspecified atrial fibrillation: Secondary | ICD-10-CM

## 2014-04-02 DIAGNOSIS — Z7901 Long term (current) use of anticoagulants: Secondary | ICD-10-CM

## 2014-04-02 LAB — POCT INR: INR: 2.4

## 2014-04-02 NOTE — Progress Notes (Signed)
History of Present Illness: 74 yo WM with history of CAD s/p 5V CABG 2005, atrial fibrillation/flutter, HTN, HLD, hyperkalemia, obesity, chronic back and shoulder pain who is here today for cardiac follow up. Echo September 2010 showed EF 60-65% mild AI/TR. Stress Myoview 6/11 EF normal. Normal perfusion. I saw him 01/10/11 for the first time. His EKG showed atrial flutter. I started Cardizem CD 120 mg po Qdaily and xarelto. I had him wear a monitor for 48 hours. He had sinus rhythm with episodes of atrial flutter. He also had 4 second pauses. His cardizem was held and Coreg was lowered. He saw Dr. Caryl Comes in April 2013 and was started on dofetilide for atrial fibrillation. He had post-termination pauses and had a pacemaker implanted in April 2013. He has since had Tikosyn stopped due to cost and was started on sotalol. He was switched from Xarelto to coumadin. Echo 10/29/12 with LVEF=55-60%, mild LVH, no significant valve disease. I saw him in February 2015 and he had chest pain c/w musculoskeletal etiology, clearly worse with movement. He was seen by Dr. Caryl Comes 07/31/13 and c/o lightheadedness. Coreg lowered to 6.25 mg po BID. Also c/o chest discomfort. Stress myoview 09/01/13 with no ischemia.   He is here today for follow up. He tells me that he has Clearly worse with movement. No SOB. No palpitations, dizziness, near syncope or syncope. Overall feeling well. z  Primary Care Physician: [name]  Last Lipid Profile: Followed in primary care  Past Medical History  Diagnosis Date  . Atrial fibrillation/flutter   . 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 Sinus node dysfunction     post termination pauses <5sec  . Hyperlipidemia   . Hypertension   . Central obesity   . Gout     "once; I have it under control w/medicine"  . BPH (benign prostatic hyperplasia)   . Chronic back pain   . Angina    . Arthritis     "in my back"  . Pacemaker     Past Surgical History  Procedure Laterality Date  . Cholecystectomy  1978   . Cardiac catheterization  2005    "that what sent me to CABG"  . Cataract extraction w/ intraocular lens  implant, bilateral  ~ 01/2011  . Coronary artery bypass graft  2005    in [place] - LIMA to LAD, SVG to diagonal, SVG to ramus, SVG to OM and SVG to RCA   . Pacemaker insertion  2014  . Transurethral resection of prostate N/A 12/29/2013    Procedure: TRANSURETHRAL RESECTION OF THE PROSTATE WITH GYRUS INSTRUMENTS;  Surgeon: [name], MD;  Location: [facility] ORS;  Service: Urology;  Laterality: N/A;  . Permanent pacemaker insertion N/A 05/25/2011    Procedure: PERMANENT PACEMAKER INSERTION;  Surgeon: [name], MD;  Location: [facility] CATH LAB;  Service: Cardiovascular;  Laterality: N/A;    Current Outpatient Prescriptions  Medication Sig Dispense Refill  . allopurinol (ZYLOPRIM) 300 MG tablet Take 300 mg by mouth every morning.     Marland Kitchen atorvastatin (LIPITOR) 40 MG tablet Take 40 mg by mouth every evening.    . carvedilol (COREG) 3.125 MG tablet Take 1 tablet (3.125 mg total) by mouth 2 (two) times daily with a meal. 60 tablet 11  . docusate sodium 100 MG CAPS Take 100 mg by mouth  2 (two) times daily. 10 capsule 0  . doxycycline (VIBRA-TABS) 100 MG tablet Take 1 tablet (100 mg total) by mouth 2 (two) times daily. 6 tablet 0  . finasteride (PROSCAR) 5 MG tablet Take 5 mg by mouth every evening.     Marland Kitchen lisinopril (PRINIVIL,ZESTRIL) 20 MG tablet Take 20 mg by mouth at bedtime.     Marland Kitchen NITROSTAT 0.4 MG SL tablet DISSOLVE ONE TABLET UNDER THE TONGUE EVERY 5 MINUTES AS NEEDED FOR CHEST PAIN.  DO NOT EXCEED A TOTAL OF 3 DOSES IN 15 MINUTES 25 tablet 0  . oxyCODONE-acetaminophen (PERCOCET/ROXICET) 5-325 MG per tablet Take 1 tablet by mouth every 8 (eight) hours as needed for severe pain. 15 tablet 0  . sotalol (BETAPACE) 120 MG tablet TAKE ONE TABLET BY MOUTH EVERY 12  HOURS 180 tablet 1  . warfarin (COUMADIN) 5 MG tablet Take 5 mg by mouth as directed.     No current facility-administered medications for this visit.    Allergies  Allergen Reactions  . Sulfur     SWELLING ON PATIENTS LIPS, HE GETS DRIED UP    History   Social History  . Marital Status: Married    Spouse Name: N/A  . Number of Children: 2  . Years of Education: N/A   Occupational History  . retired-truck driver   .     Social History Main Topics  . Smoking status: Former Smoker -- 0.00 packs/day for 0 years    Types: Cigarettes    Quit date: 01/30/1961  . Smokeless tobacco: Never Used  . Alcohol Use: No     Comment: 2 drinks (bourbon) a night  . Drug Use: No  . Sexual Activity: Yes   Other Topics Concern  . Not on file   Social History Narrative   Married. Pt works part-time for a [employer]. Quit tobacco 50 years ago..Both parents are deceased, neither one had cardiac issues and no siblings have cardiac issues.     Family History  Problem Relation Age of Onset  . Cancer Mother     Review of Systems:  As stated in the HPI and otherwise negative.   BP 142/100 mmHg  Pulse 70  Ht 5\' 6"  (1.676 m)  Wt 200 lb (90.719 kg)  BMI 32.30 kg/m2  Physical Examination: General: Well developed, well nourished, NAD HEENT: OP clear, mucus membranes moist SKIN: warm, dry. No rashes. Neuro: No focal deficits Musculoskeletal: Muscle strength 5/5 all ext Psychiatric: Mood and affect normal Neck: No JVD, no carotid bruits, no thyromegaly, no lymphadenopathy. Lungs:Clear bilaterally, no wheezes, rhonci, crackles Cardiovascular: Regular rate and rhythm. No murmurs, gallops or rubs. Abdomen:Soft. Bowel sounds present. Non-tender.  Extremities: No lower extremity edema. Pulses are 2 + in the bilateral DP/PT.  Echo 09/3012: Left ventricle: The cavity size was normal. Wall thickness was increased in a pattern of mild LVH. Systolic function was normal. The estimated  ejection fraction was in the range of 55% to 60%. Wall motion was normal; there were no regional wall motion abnormalities. Doppler parameters are consistent with abnormal left ventricular relaxation (grade 1 diastolic dysfunction).  Stress myoview 09/01/13: Stress Procedure: The patient received IV adenosine at 140 mcg/kg/min for 4 minutes. Technetium 52m Sestamibi was injected at the 2 minute mark and quantitative spect images were obtained after a 45 minute delay.  Stress ECG: intermittent v-pacing was noted  QPS  Raw Data Images: Mild diaphragmatic attenuation. Normal left ventricular size.  Stress Images: There is decreased uptake  in the inferior wall.  Rest Images: There is decreased uptake in the inferior wall.  Subtraction (SDS): No evidence of ischemia.  Transient Ischemic Dilatation (Normal <1.22): 1.08  Lung/Heart Ratio (Normal <0.45): 0.32  Quantitative Gated Spect Images  QGS EDV: 82 ml  QGS ESV: 38 ml  Impression  Exercise Capacity: Adenosine study with no exercise.  BP Response: Hypotensive blood pressure response.  Clinical Symptoms: No significant symptoms noted.  ECG Impression: Non-diagnostic, intermittent v-pacing  Comparison with Prior Nuclear Study: No significant change from previous study  Overall Impression: Low risk stress nuclear study with inferior bowel attenuation artifact. No ischemia.  LV Ejection Fraction: 53%. LV Wall Motion: NL LV Function; NL Wall Motion  EKG: Atrial paced, 70 bpm. Non-specific T wave abnormality.   Assessment and Plan:   1. CAD: Stable. His chest pain seems to be musculoskeletal. The pain is localized over his left chest wall in the same spot. Not responsive to NTG. Worsened with palpation and with movement of his left arm and shoulder. No exertional component. It seems to improve when he exercises and works in the yard. Stress test in August 2015 did not show ischemia. EKG is unchanged. He is on good medical therapy including  statin, beta blocker. He has had no cath since 2005. I have encouraged him to let me know if the chest pain changes in quality. He is not on an ASA secondary to his need for coumadin. Lipids followed in primary care office.   2. Atrial fibrillation: He is on sotalol and coumadin per Dr. Caryl Comes. Pacemaker in place. EKG reviewed today.   3. HTN: BP controlled at home and at other visits. No changes today.

## 2014-04-02 NOTE — Patient Instructions (Signed)
Your physician wants you to follow-up in:  6 months. You will receive a reminder letter in the mail two months in advance. If you don't receive a letter, please call our office to schedule the follow-up appointment.   

## 2014-04-09 ENCOUNTER — Other Ambulatory Visit: Payer: Self-pay | Admitting: *Deleted

## 2014-04-09 ENCOUNTER — Encounter: Payer: Self-pay | Admitting: *Deleted

## 2014-04-09 MED ORDER — WARFARIN SODIUM 5 MG PO TABS: 5.0000 mg | ORAL_TABLET | ORAL | Status: DC

## 2014-04-10 ENCOUNTER — Other Ambulatory Visit: Payer: Self-pay

## 2014-04-10 MED ORDER — ATORVASTATIN CALCIUM 40 MG PO TABS: 40.0000 mg | ORAL_TABLET | Freq: Every evening | ORAL | Status: DC

## 2014-04-10 MED ORDER — SOTALOL HCL 120 MG PO TABS: 120.0000 mg | ORAL_TABLET | Freq: Two times a day (BID) | ORAL | Status: DC

## 2014-04-10 MED ORDER — CARVEDILOL 3.125 MG PO TABS: 3.1250 mg | ORAL_TABLET | Freq: Two times a day (BID) | ORAL | Status: DC

## 2014-04-14 ENCOUNTER — Other Ambulatory Visit: Payer: Self-pay | Admitting: *Deleted

## 2014-04-14 MED ORDER — WARFARIN SODIUM 5 MG PO TABS: 5.0000 mg | ORAL_TABLET | ORAL | Status: DC

## 2014-04-15 ENCOUNTER — Encounter: Payer: Self-pay | Admitting: Internal Medicine

## 2014-04-15 ENCOUNTER — Other Ambulatory Visit: Payer: Self-pay

## 2014-04-15 MED ORDER — CARVEDILOL 3.125 MG PO TABS: 3.1250 mg | ORAL_TABLET | Freq: Two times a day (BID) | ORAL | Status: DC

## 2014-04-15 MED ORDER — SOTALOL HCL 120 MG PO TABS: 120.0000 mg | ORAL_TABLET | Freq: Two times a day (BID) | ORAL | Status: DC

## 2014-04-15 MED ORDER — ATORVASTATIN CALCIUM 40 MG PO TABS: 40.0000 mg | ORAL_TABLET | Freq: Every evening | ORAL | Status: DC

## 2014-04-29 ENCOUNTER — Other Ambulatory Visit: Payer: Self-pay | Admitting: Cardiovascular Disease

## 2014-04-29 MED ORDER — SOTALOL HCL 120 MG PO TABS: 120.0000 mg | ORAL_TABLET | Freq: Two times a day (BID) | ORAL | Status: DC

## 2014-04-29 MED ORDER — CARVEDILOL 3.125 MG PO TABS: 3.1250 mg | ORAL_TABLET | Freq: Two times a day (BID) | ORAL | Status: DC

## 2014-04-30 ENCOUNTER — Ambulatory Visit (INDEPENDENT_AMBULATORY_CARE_PROVIDER_SITE_OTHER): Payer: Medicare HMO | Admitting: *Deleted

## 2014-04-30 DIAGNOSIS — Z7901 Long term (current) use of anticoagulants: Secondary | ICD-10-CM

## 2014-04-30 DIAGNOSIS — I4891 Unspecified atrial fibrillation: Secondary | ICD-10-CM

## 2014-04-30 LAB — POCT INR: INR: 2.8

## 2014-05-11 ENCOUNTER — Ambulatory Visit (INDEPENDENT_AMBULATORY_CARE_PROVIDER_SITE_OTHER): Payer: Medicare HMO | Admitting: *Deleted

## 2014-05-11 ENCOUNTER — Encounter: Payer: Self-pay | Admitting: Internal Medicine

## 2014-05-11 DIAGNOSIS — I495 Sick sinus syndrome: Secondary | ICD-10-CM | POA: Diagnosis not present

## 2014-05-11 LAB — MDC_IDC_ENUM_SESS_TYPE_REMOTE
Battery Voltage: 2.95 V
Brady Statistic AP VP Percent: 53 %
Brady Statistic AS VP Percent: 1 %
Brady Statistic RV Percent Paced: 54 %
Implantable Pulse Generator Model: 2110
Implantable Pulse Generator Serial Number: 7322329
Lead Channel Impedance Value: 430 Ohm
Lead Channel Impedance Value: 460 Ohm
Lead Channel Pacing Threshold Amplitude: 0.625 V
Lead Channel Pacing Threshold Amplitude: 1 V
Lead Channel Pacing Threshold Pulse Width: 0.4 ms
Lead Channel Pacing Threshold Pulse Width: 0.4 ms
Lead Channel Sensing Intrinsic Amplitude: 2.4 mV
MDC IDC MSMT BATTERY REMAINING LONGEVITY: 94 mo
MDC IDC MSMT BATTERY REMAINING PERCENTAGE: 72 %
MDC IDC MSMT LEADCHNL RV SENSING INTR AMPL: 8.4 mV
MDC IDC SESS DTM: 20160411130442
MDC IDC SET LEADCHNL RA PACING AMPLITUDE: 1.625
MDC IDC SET LEADCHNL RV PACING AMPLITUDE: 1.25 V
MDC IDC SET LEADCHNL RV PACING PULSEWIDTH: 0.4 ms
MDC IDC SET LEADCHNL RV SENSING SENSITIVITY: 2 mV
MDC IDC STAT BRADY AP VS PERCENT: 40 %
MDC IDC STAT BRADY AS VS PERCENT: 5.9 %
MDC IDC STAT BRADY RA PERCENT PACED: 93 %

## 2014-05-11 NOTE — Progress Notes (Signed)
Remote pacemaker transmission.   

## 2014-06-09 ENCOUNTER — Encounter: Payer: Self-pay | Admitting: Cardiology

## 2014-06-11 ENCOUNTER — Ambulatory Visit (INDEPENDENT_AMBULATORY_CARE_PROVIDER_SITE_OTHER): Payer: Medicare HMO | Admitting: *Deleted

## 2014-06-11 DIAGNOSIS — I4891 Unspecified atrial fibrillation: Secondary | ICD-10-CM

## 2014-06-11 DIAGNOSIS — Z7901 Long term (current) use of anticoagulants: Secondary | ICD-10-CM

## 2014-06-11 LAB — POCT INR: INR: 3.3

## 2014-06-30 ENCOUNTER — Telehealth: Payer: Self-pay | Admitting: Cardiovascular Disease

## 2014-06-30 ENCOUNTER — Encounter: Payer: Self-pay | Admitting: Physician Assistant

## 2014-06-30 ENCOUNTER — Ambulatory Visit (INDEPENDENT_AMBULATORY_CARE_PROVIDER_SITE_OTHER): Payer: Medicare HMO | Admitting: Physician Assistant

## 2014-06-30 VITALS — BP 128/78 | HR 63 | Ht 66.0 in | Wt 197.0 lb

## 2014-06-30 DIAGNOSIS — E7800 Pure hypercholesterolemia, unspecified: Secondary | ICD-10-CM

## 2014-06-30 DIAGNOSIS — I10 Essential (primary) hypertension: Secondary | ICD-10-CM

## 2014-06-30 DIAGNOSIS — I2510 Atherosclerotic heart disease of native coronary artery without angina pectoris: Secondary | ICD-10-CM

## 2014-06-30 DIAGNOSIS — Z950 Presence of cardiac pacemaker: Secondary | ICD-10-CM

## 2014-06-30 DIAGNOSIS — I495 Sick sinus syndrome: Secondary | ICD-10-CM

## 2014-06-30 DIAGNOSIS — R079 Chest pain, unspecified: Secondary | ICD-10-CM

## 2014-06-30 DIAGNOSIS — I480 Paroxysmal atrial fibrillation: Secondary | ICD-10-CM

## 2014-06-30 LAB — TROPONIN I: Troponin I: <0.01 ng/mL (ref <0.06)

## 2014-06-30 NOTE — Telephone Encounter (Signed)
New Message  Pt calling to speak w/ Rn about his CP.   Pt c/o of Chest Pain: STAT if CP now or developed within 24 hours  1. Are you having CP right now? no  2. Are you experiencing any other symptoms (ex. SOB, nausea, vomiting, sweating)? No, just took 2pm- BP- 110/80 p. 65  3. How long have you been experiencing CP? Last night- before its every once   4. Is your CP continuous or coming and going? Comes and goes  5. Have you taken Nitroglycerin? Yes, today  ?

## 2014-06-30 NOTE — Telephone Encounter (Signed)
Spoke with pt. He reports episode of chest pain last night after dinner. Went away on it's own. Started on right side and moved across chest.  Had been working outside all day.  Today around noon he was putting wood up on bench and had stabbing chest pain. Sat down and took NTG with relief of pain.  He rested for an hour or so and then got up to go to kitchen to eat around 2:00. Had sharp,stabbing chest pain that lasted for about a minute. Went away on it's own.  No pain at present time. Reports pain is different than when he saw Dr. Angelena Form in March.  Reports he has had similar pain off and on for years.  States Dr. Angelena Form mentioned possible cath if pain changed.  Will review with provider in office.

## 2014-06-30 NOTE — Progress Notes (Signed)
Cardiology Office Note   Date:  06/30/2014   ID:  Dylan Roth, DOB [birth year], MRN [id number]  PCP:  [name]  Cardiologist:  Dr. Lauree Chandler  Electrophysiologist:  Dr. Virl Axe   Chief Complaint  Patient presents with  . Chest Pain  . Coronary Artery Disease     History of Present Illness: Dylan Roth is a 74 y.o. male with a hx of CAD s/p 5V CABG 2005, atrial fibrillation/flutter, HTN, HLD, hyperkalemia, obesity, chronic back and shoulder pain. Echo September 2010 showed EF 60-65% mild AI/TR. Stress Myoview 6/11 EF normal. Normal perfusion. Established with Dr. Lauree Chandler 01/10/11. His EKG showed atrial flutter. He was started on Cardizem CD 120 mg po Qdaily and xarelto. 48 hr holter demonstrated sinus rhythm with episodes of atrial flutter. He also had 4 second pauses. His cardizem was held and Coreg was lowered. He saw Dr. Caryl Comes in April 2013 and was started on dofetilide for atrial fibrillation. He had post-termination pauses and had a pacemaker implanted in April 2013. He has since had Tikosyn stopped due to cost and was started on sotalol. He was switched from Xarelto to coumadin. Echo 10/29/12 with LVEF=55-60%, mild LVH, no significant valve disease. He was seen in February 2015 and he had chest pain c/w musculoskeletal etiology, clearly worse with movement. He was seen by Dr. Caryl Comes 07/31/13 and c/o lightheadedness. Coreg lowered to 6.25 mg po BID. Also c/o chest discomfort. Stress myoview 7/15 with no ischemia.   Last seen by Dr. Lauree Chandler 04/02/14.  He called in today with complaints of chest pain and was added on for further evaluation.  He tells me that he has been having chest pain his whole life. He says that he has been having the same chest pain since prior to his CABG. He does not feel like his chest pain has ever changed since the CABG. He has a frozen shoulder on the left. He has a lot of chest pain with positional changes with  his bilateral upper extremities. He can also push on his chest and reproduce symptoms.  He had some chest discomfort last night after dinner. He denies indigestion or dysphagia.  He then was working in his shop this afternoon and had recurrent chest pain.  This was sharp and only lasted about 1-2 minutes.  He denies any radiating symptoms or associated dyspnea, nausea or diaphoresis. He did take NTG today but his symptoms were resolving at the time.  It is not clear that the NTG made his pain better.  He had more chest pain in the office today but this was clearly MSK and brought on with movement of his LUE.  He denies exertional chest pain or significant DOE.  He is NYHA 2-2b.  He denies orthopnea, PND, edema.  No syncope.    Studies/Reports Reviewed Today:  Myoview 08/13/13 Overall Impression:  Low risk stress nuclear study with inferior bowel attenuation artifact. No ischemia.  LV Ejection Fraction: 53%.  LV Wall Motion:  NL LV Function; NL Wall Motion  Echo 10/29/12 Mild LVH. EF 55% to 60%. Wall motion was normal;Grade 1 diastolic dysfunction).   Past Medical History  Diagnosis Date  . Atrial fibrillation/flutter   . 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 Sinus node dysfunction     post termination pauses <5sec  . Hyperlipidemia   .  Hypertension   . Central obesity   . Gout     "once; I have it under control w/medicine"  . BPH (benign prostatic hyperplasia)   . Chronic back pain   . Angina   . Arthritis     "in my back"  . Pacemaker     Past Surgical History  Procedure Laterality Date  . Cholecystectomy  1978   . Cardiac catheterization  2005    "that what sent me to CABG"  . Cataract extraction w/ intraocular lens  implant, bilateral  ~ 01/2011  . Coronary artery bypass graft  2005    in [place] - LIMA to LAD, SVG to diagonal, SVG to ramus, SVG to OM and SVG to RCA     . Pacemaker insertion  2014  . Transurethral resection of prostate N/A 12/29/2013    Procedure: TRANSURETHRAL RESECTION OF THE PROSTATE WITH GYRUS INSTRUMENTS;  Surgeon: [name], MD;  Location: [facility];  Service: Urology;  Laterality: N/A;  . Permanent pacemaker insertion N/A 05/25/2011    Procedure: PERMANENT PACEMAKER INSERTION;  Surgeon: [name], MD;  Location: [facility];  Service: Cardiovascular;  Laterality: N/A;     Current Outpatient Prescriptions  Medication Sig Dispense Refill  . allopurinol (ZYLOPRIM) 300 MG tablet Take 300 mg by mouth every morning.     Marland Kitchen atorvastatin (LIPITOR) 40 MG tablet Take 1 tablet (40 mg total) by mouth every evening. 90 tablet 1  . carvedilol (COREG) 3.125 MG tablet Take 1 tablet (3.125 mg total) by mouth 2 (two) times daily with a meal. 180 tablet 1  . docusate sodium 100 MG CAPS Take 100 mg by mouth 2 (two) times daily. 10 capsule 0  . doxycycline (VIBRA-TABS) 100 MG tablet Take 1 tablet (100 mg total) by mouth 2 (two) times daily. 6 tablet 0  . finasteride (PROSCAR) 5 MG tablet Take 5 mg by mouth every evening.     Marland Kitchen lisinopril (PRINIVIL,ZESTRIL) 20 MG tablet Take 20 mg by mouth at bedtime.     Marland Kitchen NITROSTAT 0.4 MG SL tablet DISSOLVE ONE TABLET UNDER THE TONGUE EVERY 5 MINUTES AS NEEDED FOR CHEST PAIN.  DO NOT EXCEED A TOTAL OF 3 DOSES IN 15 MINUTES 25 tablet 0  . oxyCODONE-acetaminophen (PERCOCET/ROXICET) 5-325 MG per tablet Take 1 tablet by mouth every 8 (eight) hours as needed for severe pain. 15 tablet 0  . sotalol (BETAPACE) 120 MG tablet Take 1 tablet (120 mg total) by mouth every 12 (twelve) hours. 180 tablet 1  . warfarin (COUMADIN) 5 MG tablet Take 1 tablet (5 mg total) by mouth as directed. Or as directed by the [facility] 70 tablet 1   No current facility-administered medications for this visit.    Allergies:   Sulfur    Social History:  The patient  reports that he quit smoking about 53 years ago. His smoking use  included Cigarettes. He smoked 0.00 packs per day for 0 years. He has never used smokeless tobacco. He reports that he does not drink alcohol or use illicit drugs.   Family History:  The patient's family history includes Cancer in his mother; Stroke in his brother. There is no history of Heart attack.    ROS:   Please see the history of present illness.   Review of Systems  Cardiovascular: Positive for chest pain.  All other systems reviewed and are negative.     PHYSICAL EXAM: VS:  BP 128/78 mmHg  Pulse 63  Ht 5\' 6"  (1.676 m)  Wt 197 lb (89.359 kg)  BMI 31.81 kg/m2    Wt Readings from Last 3 Encounters:  06/30/14 197 lb (89.359 kg)  04/02/14 200 lb (90.719 kg)  01/29/14 199 lb 11.2 oz (90.583 kg)     GEN: Well nourished, well developed, in no acute distress HEENT: normal Neck: no JVD,  no masses Cardiac:  Normal S1/S2, RRR; no murmur ,  no rubs or gallops, no edema   Respiratory:  clear to auscultation bilaterally, no wheezing, rhonchi or rales. GI: soft, nontender, nondistended, + BS MS: no deformity or atrophy Skin: warm and dry  Neuro:  CNs II-XII intact, Strength and sensation are intact Psych: Normal affect   EKG:  EKG is ordered today.  It demonstrates:   A paced, HR 63, TWI 3, aVF, V2-5   Recent Labs: 07/31/2013: Magnesium 2.1 01/29/2014: BUN 17; Creatinine 1.00; Hemoglobin 10.8*; Platelets 185; Potassium 4.3; Sodium 138    Lipid Panel    Component Value Date/Time   CHOL 134 07/11/2010 1026   TRIG 64.0 07/11/2010 1026   HDL 35.30* 07/11/2010 1026   CHOLHDL 4 07/11/2010 1026   VLDL 12.8 07/11/2010 1026   LDLCALC 86 07/11/2010 1026      ASSESSMENT AND PLAN:  Chest pain, unspecified chest pain type:  His symptoms are very atypical.  I was able to reproduce some of his symptoms with palpation.  His chest pain is very concerning for him.  He does anterolateral TWI on his ECG that are similar to prior tracings.  However, they are somewhat more prominent  today.  I will obtain a stat Troponin.  If this is positive, he will need to come to the ED for admission and probable LHC.  If this is negative, I will have him undergo a Lexiscan Myoview.  If the perfusion is normal, I would not pursue any further testing.  He could possibly see ortho at that point for further recommendations, if any (especially for his L shoulder).    Atherosclerosis of native coronary artery of native heart without angina pectoris:  Proceed with stress testing as noted.  He is not on ASA as he is on Coumadin.  Continue statin, beta-blocker, ACE inhibitor.  Essential hypertension, benign:  Controlled.   HYPERCHOLESTEROLEMIA:  Continue statin.   Paroxysmal atrial fibrillation:  Maintaining NSR. Continue Coumadin.  Sinus node dysfunction s/p Pacemaker-St.Jude:  FU with EP as planned.    Current medicines are reviewed at length with the patient today.  Concerns regarding medicines are as outlined above.  The following changes have been made:    None    Labs/ tests ordered today include:  Orders Placed This Encounter  Procedures  . Troponin I  . Myocardial Perfusion Imaging  . EKG 12-Lead    Disposition:   FU with Dr. Lauree Chandler or me in 2-4 weeks.     Signed, Versie Starks, MHS 06/30/2014 4:19 PM    [facility] [address], [place], [place]  [address] Phone: [phone]; Fax: [phone]

## 2014-06-30 NOTE — Telephone Encounter (Signed)
thanks

## 2014-06-30 NOTE — Patient Instructions (Signed)
Medication Instructions:  Your physician recommends that you continue on your current medications as directed. Please refer to the Current Medication list given to you today.  Labwork: TODAY STAT TROPONIN I; IF AFTER 5:30 PM PLEASE CALL THE ON CALL PROVIDER Ellen Henri, PA FOR [facility]  Testing/Procedures: Your physician has requested that you have a lexiscan myoview. For further information please visit [email or web address]. Please follow instruction sheet, as given.   Follow-Up: FOLLOW UP WITH [place], [facility] 07/24/14 @ 11:10 SAME DAY DR. Angelena Form IS IN THE OFFICE  Any Other Special Instructions Will Be Listed Below (If Applicable).

## 2014-06-30 NOTE — Telephone Encounter (Signed)
Reviewed with Richardson Dopp, PA and he can see pt today at 3:30. I spoke with pt and he will be here for this appt. Pt made aware to call 911 if symptoms change.

## 2014-07-13 ENCOUNTER — Ambulatory Visit (INDEPENDENT_AMBULATORY_CARE_PROVIDER_SITE_OTHER): Payer: Medicare HMO | Admitting: *Deleted

## 2014-07-13 DIAGNOSIS — Z7901 Long term (current) use of anticoagulants: Secondary | ICD-10-CM | POA: Diagnosis not present

## 2014-07-13 DIAGNOSIS — I4891 Unspecified atrial fibrillation: Secondary | ICD-10-CM | POA: Diagnosis not present

## 2014-07-13 LAB — POCT INR: INR: 2.6

## 2014-07-21 ENCOUNTER — Encounter (HOSPITAL_COMMUNITY): Payer: Medicare HMO

## 2014-07-24 ENCOUNTER — Ambulatory Visit: Payer: Medicare HMO | Admitting: Physician Assistant

## 2014-08-07 ENCOUNTER — Encounter: Payer: Self-pay | Admitting: Internal Medicine

## 2014-08-07 ENCOUNTER — Ambulatory Visit (INDEPENDENT_AMBULATORY_CARE_PROVIDER_SITE_OTHER): Payer: Medicare HMO | Admitting: *Deleted

## 2014-08-07 ENCOUNTER — Ambulatory Visit (INDEPENDENT_AMBULATORY_CARE_PROVIDER_SITE_OTHER): Payer: Medicare HMO | Admitting: Internal Medicine

## 2014-08-07 VITALS — BP 100/60 | HR 59 | Ht 66.0 in | Wt 197.8 lb

## 2014-08-07 DIAGNOSIS — I480 Paroxysmal atrial fibrillation: Secondary | ICD-10-CM

## 2014-08-07 DIAGNOSIS — Z79899 Other long term (current) drug therapy: Secondary | ICD-10-CM | POA: Diagnosis not present

## 2014-08-07 DIAGNOSIS — Z5181 Encounter for therapeutic drug level monitoring: Secondary | ICD-10-CM

## 2014-08-07 DIAGNOSIS — Z45018 Encounter for adjustment and management of other part of cardiac pacemaker: Secondary | ICD-10-CM

## 2014-08-07 DIAGNOSIS — I495 Sick sinus syndrome: Secondary | ICD-10-CM | POA: Diagnosis not present

## 2014-08-07 DIAGNOSIS — I4891 Unspecified atrial fibrillation: Secondary | ICD-10-CM

## 2014-08-07 DIAGNOSIS — Z7901 Long term (current) use of anticoagulants: Secondary | ICD-10-CM | POA: Diagnosis not present

## 2014-08-07 LAB — CUP PACEART INCLINIC DEVICE CHECK
Battery Remaining Longevity: 102 mo
Battery Voltage: 2.93 V
Brady Statistic RA Percent Paced: 93 %
Date Time Interrogation Session: 20160708131131
Lead Channel Impedance Value: 487.5 Ohm
Lead Channel Pacing Threshold Amplitude: 0.875 V
Lead Channel Pacing Threshold Pulse Width: 0.4 ms
Lead Channel Pacing Threshold Pulse Width: 0.4 ms
Lead Channel Sensing Intrinsic Amplitude: 3.4 mV
Lead Channel Setting Pacing Amplitude: 1.125
Lead Channel Setting Pacing Amplitude: 1.625
Lead Channel Setting Pacing Pulse Width: 0.4 ms
Lead Channel Setting Sensing Sensitivity: 2 mV
MDC IDC MSMT LEADCHNL RA PACING THRESHOLD AMPLITUDE: 0.625 V
MDC IDC MSMT LEADCHNL RV IMPEDANCE VALUE: 450 Ohm
MDC IDC MSMT LEADCHNL RV SENSING INTR AMPL: 8.5 mV
MDC IDC PG SERIAL: 7322329
MDC IDC STAT BRADY RV PERCENT PACED: 54 %
Pulse Gen Model: 2110

## 2014-08-07 LAB — BASIC METABOLIC PANEL
BUN: 22 mg/dL (ref 6–23)
CALCIUM: 9.2 mg/dL (ref 8.4–10.5)
CO2: 27 mEq/L (ref 19–32)
Chloride: 103 mEq/L (ref 96–112)
Creatinine, Ser: 1 mg/dL (ref 0.40–1.50)
GFR: 77.94 mL/min (ref >60.00)
GLUCOSE: 78 mg/dL (ref 70–99)
POTASSIUM: 4.4 meq/L (ref 3.5–5.1)
Sodium: 137 mEq/L (ref 135–145)

## 2014-08-07 LAB — POCT INR: INR: 3

## 2014-08-07 LAB — MAGNESIUM: Magnesium: 2 mg/dL (ref 1.5–2.5)

## 2014-08-07 NOTE — Progress Notes (Signed)
Patient Care Team: Jeannine Boga as PCP - General (Family Medicine)   HPI  Dylan Roth is a 74 y.o. male Seen in followup for atrial fibrillation with  posttermination pauses for which he underwent pacemaker implantation 4/13. He has been treated with sotalol and Rivaroxaban; he ended up back on warfarin because of bleeding issues  He has infrequent palpitations.   He has a history of ischemic heart disease with prior bypass surgery 2005. Ejection fraction by echo 9/14 was normal Stress myoview 7/15 with no ischemia  He continues with symptoms of chest pain. It is rather diffuse and is rather tender to touch   Past Medical History  Diagnosis Date  . Atrial fibrillation/flutter   . 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 Sinus node dysfunction     post termination pauses <5sec  . Hyperlipidemia   . Hypertension   . Central obesity   . Gout     "once; I have it under control w/medicine"  . BPH (benign prostatic hyperplasia)   . Chronic back pain   . Angina   . Arthritis     "in my back"  . Pacemaker     Past Surgical History  Procedure Laterality Date  . Cholecystectomy  1978   . Cardiac catheterization  2005    "that what sent me to CABG"  . Cataract extraction w/ intraocular lens  implant, bilateral  ~ 01/2011  . Coronary artery bypass graft  2005    in [place] - LIMA to LAD, SVG to diagonal, SVG to ramus, SVG to OM and SVG to RCA   . Pacemaker insertion  2014  . Transurethral resection of prostate N/A 12/29/2013    Procedure: TRANSURETHRAL RESECTION OF THE PROSTATE WITH GYRUS INSTRUMENTS;  Surgeon: [name], MD;  Location: [facility];  Service: Urology;  Laterality: N/A;  . Permanent pacemaker insertion N/A 05/25/2011    Procedure: PERMANENT PACEMAKER INSERTION;  Surgeon: [name], MD;  Location: [facility] CATH LAB;  Service: Cardiovascular;  Laterality:  N/A;    Current Outpatient Prescriptions  Medication Sig Dispense Refill  . allopurinol (ZYLOPRIM) 300 MG tablet Take 300 mg by mouth every morning.     Marland Kitchen atorvastatin (LIPITOR) 40 MG tablet Take 1 tablet (40 mg total) by mouth every evening. 90 tablet 1  . carvedilol (COREG) 3.125 MG tablet Take 1 tablet (3.125 mg total) by mouth 2 (two) times daily with a meal. 180 tablet 1  . docusate sodium 100 MG CAPS Take 100 mg by mouth 2 (two) times daily. 10 capsule 0  . finasteride (PROSCAR) 5 MG tablet Take 5 mg by mouth every evening.     Marland Kitchen lisinopril (PRINIVIL,ZESTRIL) 20 MG tablet Take 20 mg by mouth at bedtime.     Marland Kitchen NITROSTAT 0.4 MG SL tablet DISSOLVE ONE TABLET UNDER THE TONGUE EVERY 5 MINUTES AS NEEDED FOR CHEST PAIN.  DO NOT EXCEED A TOTAL OF 3 DOSES IN 15 MINUTES 25 tablet 0  . sotalol (BETAPACE) 120 MG tablet Take 1 tablet (120 mg total) by mouth every 12 (twelve) hours. 180 tablet 1  . warfarin (COUMADIN) 5 MG tablet Take 1 tablet (5 mg total) by mouth as directed. Or as directed by the [facility] 70 tablet 1   No current facility-administered medications for this visit.    Allergies  Allergen  Reactions  . Sulfur     SWELLING ON PATIENTS LIPS, HE GETS DRIED UP    Review of Systems negative except from HPI and PMH  Physical Exam BP 100/60 mmHg  Pulse 59  Ht 5\' 6"  (1.676 m)  Wt 197 lb 12.8 oz (89.721 kg)  BMI 31.94 kg/m2 Well developed and nourished in no acute distress HENT normal Neck supple with JVP-flat Clear Regular rate and rhythm, no murmurs or gallops Abd-soft with active BS No Clubbing cyanosis edema Skin-warm and dry A & Oriented  Grossly normal sensory and motor function  ECG: AV pacing at 65            Intervals  22/09/41  Axis 41     Assessment and  Plan  Ischemic heart disease with prior bypass surgery  Exertional chest discomfort/dyspnea-atypical  Sinus node dysfunction and chronotropic incompetence  Pacemaker-St. Jude  Anixiety   His  chest pain is non-cardiac. Tried to reassure him. Blood pressure is well-controlled.  We discussed strategies of anxiety management including biofeedback. I've asked him to follow-up with his PCP to further consider non-pharmacological approach  We spent more than 50% of our >25 min visit in face to face counseling regarding the above

## 2014-08-07 NOTE — Patient Instructions (Signed)
Medication Instructions:  Your physician recommends that you continue on your current medications as directed. Please refer to the Current Medication list given to you today  Labwork: Medication surveillance labs today: Magnesium and BMET  Testing/Procedures: None ordered  Follow-Up: Remote monitoring is used to monitor your Pacemaker of ICD from home. This monitoring reduces the number of office visits required to check your device to one time per year. It allows [place] to keep an eye on the functioning of your device to ensure it is working properly. You are scheduled for a device check from home on 11/09/14. You may send your transmission at any time that day. If you have a wireless device, the transmission will be sent automatically. After your physician reviews your transmission, you will receive a postcard with your next transmission date.  Your physician wants you to follow-up in: 1 year with Dr. Caryl Comes.  You will receive a reminder letter in the mail two months in advance. If you don't receive a letter, please call our office to schedule the follow-up appointment.    Any Other Special Instructions Will Be Listed Below (If Applicable). Thank you for choosing [facility]!!

## 2014-09-04 ENCOUNTER — Ambulatory Visit (INDEPENDENT_AMBULATORY_CARE_PROVIDER_SITE_OTHER): Payer: Medicare HMO | Admitting: *Deleted

## 2014-09-04 DIAGNOSIS — I4891 Unspecified atrial fibrillation: Secondary | ICD-10-CM

## 2014-09-04 DIAGNOSIS — Z7901 Long term (current) use of anticoagulants: Secondary | ICD-10-CM | POA: Diagnosis not present

## 2014-09-04 LAB — POCT INR: INR: 3

## 2014-09-21 ENCOUNTER — Ambulatory Visit (INDEPENDENT_AMBULATORY_CARE_PROVIDER_SITE_OTHER): Payer: Medicare HMO | Admitting: Cardiovascular Disease

## 2014-09-21 ENCOUNTER — Encounter: Payer: Self-pay | Admitting: Cardiovascular Disease

## 2014-09-21 VITALS — BP 122/70 | HR 88 | Ht 66.0 in | Wt 197.0 lb

## 2014-09-21 DIAGNOSIS — I10 Essential (primary) hypertension: Secondary | ICD-10-CM

## 2014-09-21 DIAGNOSIS — I2510 Atherosclerotic heart disease of native coronary artery without angina pectoris: Secondary | ICD-10-CM | POA: Diagnosis not present

## 2014-09-21 DIAGNOSIS — I480 Paroxysmal atrial fibrillation: Secondary | ICD-10-CM | POA: Diagnosis not present

## 2014-09-21 NOTE — Progress Notes (Signed)
Chief Complaint  Patient presents with  . Chest Pain      History of Present Illness: 74 yo WM with history of CAD s/p 5V CABG 2005, atrial fibrillation/flutter, HTN, HLD, hyperkalemia, obesity, chronic back and shoulder pain who is here today for cardiac follow up. Echo September 2010 showed EF 60-65% mild AI/TR. Stress Myoview 6/11 EF normal. Normal perfusion. I saw him 01/10/11 for the first time. His EKG showed atrial flutter. I started Cardizem CD 120 mg po Qdaily and xarelto. I had him wear a monitor for 48 hours. He had sinus rhythm with episodes of atrial flutter. He also had 4 second pauses. His cardizem was held and Coreg was lowered. He saw Dr. Caryl Comes in April 2013 and was started on dofetilide for atrial fibrillation. He had post-termination pauses and had a pacemaker implanted in April 2013. He has since had Tikosyn stopped due to cost and was started on sotalol. He was switched from Xarelto to coumadin. Echo 10/29/12 with LVEF=55-60%, mild LVH, no significant valve disease. I saw him in February 2015 and he had chest pain c/w musculoskeletal etiology, clearly worse with movement. He was seen by Dr. Caryl Comes 07/31/13 and c/o lightheadedness. Coreg lowered to 6.25 mg po BID. Also c/o chest discomfort. Stress myoview 09/01/13 with no ischemia. He was seen in our office May 2016 by Richardson Dopp, PA-C with c/o chest pain. Stress myoview was ordered but was cancelled by patient due to cost.   He is here today for follow up. He continues to have daily chest pains. Last for a few seconds. No SOB. No palpitations, dizziness, near syncope or syncope. Overall feeling well.   Primary Care Physician: [name]  Last Lipid Profile: Followed in primary care  Past Medical History  Diagnosis Date  . Atrial fibrillation/flutter   . 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 Sinus node dysfunction    post termination pauses <5sec  . Hyperlipidemia   . Hypertension   . Central obesity   . Gout     "once; I have it under control w/medicine"  . BPH (benign prostatic hyperplasia)   . Chronic back pain   . Angina   . Arthritis     "in my back"  . Pacemaker     Past Surgical History  Procedure Laterality Date  . Cholecystectomy  1978   . Cardiac catheterization  2005    "that what sent me to CABG"  . Cataract extraction w/ intraocular lens  implant, bilateral  ~ 01/2011  . Coronary artery bypass graft  2005    in [place] - LIMA to LAD, SVG to diagonal, SVG to ramus, SVG to OM and SVG to RCA   . Pacemaker insertion  2014  . Transurethral resection of prostate N/A 12/29/2013    Procedure: TRANSURETHRAL RESECTION OF THE PROSTATE WITH GYRUS INSTRUMENTS;  Surgeon: [name], MD;  Location: [facility];  Service: Urology;  Laterality: N/A;  . Permanent pacemaker insertion N/A 05/25/2011    Procedure: PERMANENT PACEMAKER INSERTION;  Surgeon: [name], MD;  Location: [facility] CATH LAB;  Service: Cardiovascular;  Laterality: N/A;    Current Outpatient Prescriptions  Medication Sig Dispense Refill  . allopurinol (ZYLOPRIM) 300 MG tablet Take 300 mg by mouth every morning.     Marland Kitchen atorvastatin (LIPITOR) 40 MG tablet Take 1 tablet (40 mg  total) by mouth every evening. 90 tablet 1  . carvedilol (COREG) 3.125 MG tablet Take 1 tablet (3.125 mg total) by mouth 2 (two) times daily with a meal. 180 tablet 1  . docusate sodium 100 MG CAPS Take 100 mg by mouth 2 (two) times daily. 10 capsule 0  . finasteride (PROSCAR) 5 MG tablet Take 5 mg by mouth every evening.     Marland Kitchen lisinopril (PRINIVIL,ZESTRIL) 20 MG tablet Take 20 mg by mouth at bedtime.     Marland Kitchen NITROSTAT 0.4 MG SL tablet DISSOLVE ONE TABLET UNDER THE TONGUE EVERY 5 MINUTES AS NEEDED FOR CHEST PAIN.  DO NOT EXCEED A TOTAL OF 3 DOSES IN 15 MINUTES 25 tablet 0  . sotalol (BETAPACE) 120 MG tablet Take 1 tablet (120 mg total) by mouth every 12  (twelve) hours. 180 tablet 1  . warfarin (COUMADIN) 5 MG tablet Take 1 tablet (5 mg total) by mouth as directed. Or as directed by the [facility] 70 tablet 1  . cholecalciferol (VITAMIN D) 1000 UNITS tablet Take 1,000 Units by mouth daily.     No current facility-administered medications for this visit.    Allergies  Allergen Reactions  . Sulfur     SWELLING ON PATIENTS LIPS, HE GETS DRIED UP    Social History   Social History  . Marital Status: Married    Spouse Name: N/A  . Number of Children: 2  . Years of Education: N/A   Occupational History  . retired-truck driver   .     Social History Main Topics  . Smoking status: Former Smoker -- 0.00 packs/day for 0 years    Types: Cigarettes    Quit date: 01/30/1961  . Smokeless tobacco: Never Used  . Alcohol Use: No     Comment: 2 drinks (bourbon) a night  . Drug Use: No  . Sexual Activity: Yes   Other Topics Concern  . Not on file   Social History Narrative   Married. Pt works part-time for a [employer]. Quit tobacco 50 years ago..Both parents are deceased, neither one had cardiac issues and no siblings have cardiac issues.     Family History  Problem Relation Age of Onset  . Cancer Mother   . Heart attack Neg Hx   . Stroke Brother     Review of Systems:  As stated in the HPI and otherwise negative.   BP 122/70 mmHg  Pulse 88  Ht 5\' 6"  (1.676 m)  Wt 197 lb (89.359 kg)  BMI 31.81 kg/m2  SpO2 96%  Physical Examination: General: Well developed, well nourished, NAD HEENT: OP clear, mucus membranes moist SKIN: warm, dry. No rashes. Neuro: No focal deficits Musculoskeletal: Muscle strength 5/5 all ext Psychiatric: Mood and affect normal Neck: No JVD, no carotid bruits, no thyromegaly, no lymphadenopathy. Lungs:Clear bilaterally, no wheezes, rhonci, crackles Cardiovascular: Regular rate and rhythm. No murmurs, gallops or rubs. Abdomen:Soft. Bowel sounds present. Non-tender.  Extremities: No lower  extremity edema. Pulses are 2 + in the bilateral DP/PT.  Echo 09/3012: Left ventricle: The cavity size was normal. Wall thickness was increased in a pattern of mild LVH. Systolic function was normal. The estimated ejection fraction was in the range of 55% to 60%. Wall motion was normal; there were no regional wall motion abnormalities. Doppler parameters are consistent with abnormal left ventricular relaxation (grade 1 diastolic dysfunction).  Stress myoview 09/01/13: Stress Procedure: The patient received IV adenosine at 140 mcg/kg/min for 4 minutes. Technetium 14m Sestamibi  was injected at the 2 minute mark and quantitative spect images were obtained after a 45 minute delay.  Stress ECG: intermittent v-pacing was noted  QPS  Raw Data Images: Mild diaphragmatic attenuation. Normal left ventricular size.  Stress Images: There is decreased uptake in the inferior wall.  Rest Images: There is decreased uptake in the inferior wall.  Subtraction (SDS): No evidence of ischemia.  Transient Ischemic Dilatation (Normal <1.22): 1.08  Lung/Heart Ratio (Normal <0.45): 0.32  Quantitative Gated Spect Images  QGS EDV: 82 ml  QGS ESV: 38 ml  Impression  Exercise Capacity: Adenosine study with no exercise.  BP Response: Hypotensive blood pressure response.  Clinical Symptoms: No significant symptoms noted.  ECG Impression: Non-diagnostic, intermittent v-pacing  Comparison with Prior Nuclear Study: No significant change from previous study  Overall Impression: Low risk stress nuclear study with inferior bowel attenuation artifact. No ischemia.  LV Ejection Fraction: 53%. LV Wall Motion: NL LV Function; NL Wall Motion  EKG:  EKG is not ordered today. The ekg ordered today demonstrates   Recent Labs: 01/29/2014: Hemoglobin 10.8*; Platelets 185 08/07/2014: BUN 22; Creatinine, Ser 1.00; Magnesium 2.0; Potassium 4.4; Sodium 137   Lipid Panel Followed in primary care   Wt Readings from Last 3  Encounters:  09/21/14 197 lb (89.359 kg)  08/07/14 197 lb 12.8 oz (89.721 kg)  06/30/14 197 lb (89.359 kg)     Other studies Reviewed: Additional studies/ records that were reviewed today include: . Review of the above records demonstrates:   Assessment and Plan:   1. CAD: Stable. His chest pain seems to be musculoskeletal. The pain is localized over his left chest wall in the same spot as last visit. Only occurs with movement. Not responsive to NTG. Worsened with palpation and with movement of his left arm and shoulder. No exertional component. Stress test in August 2015 did not show ischemia. He is on good medical therapy including statin, beta blocker. He has had no cath since 2005. I have encouraged him to let me know if the chest pain changes in quality. He is not on an ASA secondary to his need for coumadin. Lipids followed in primary care office.   2. Atrial fibrillation: He is on sotalol and coumadin per Dr. Caryl Comes. Pacemaker in place.   3. HTN: BP controlled at home and at other visits. No changes today.   Current medicines are reviewed at length with the patient today.  The patient does not have concerns regarding medicines.  The following changes have been made:  no change  Labs/ tests ordered today include:  No orders of the defined types were placed in this encounter.    Disposition:   FU with me in 6 months  Signed, Lauree Chandler, MD 09/21/2014 12:41 PM    [address], [place], [place]  [address] Phone: [phone]; Fax: [phone]

## 2014-09-21 NOTE — Patient Instructions (Signed)
Medication Instructions:  Your physician recommends that you continue on your current medications as directed. Please refer to the Current Medication list given to you today.   Labwork: none  Testing/Procedures: none  Follow-Up: Your physician wants you to follow-up in: 6 months.  You will receive a reminder letter in the mail two months in advance. If you don't receive a letter, please call our office to schedule the follow-up appointment.       

## 2014-10-09 ENCOUNTER — Ambulatory Visit (INDEPENDENT_AMBULATORY_CARE_PROVIDER_SITE_OTHER): Payer: Medicare HMO | Admitting: *Deleted

## 2014-10-09 DIAGNOSIS — I4891 Unspecified atrial fibrillation: Secondary | ICD-10-CM

## 2014-10-09 DIAGNOSIS — Z7901 Long term (current) use of anticoagulants: Secondary | ICD-10-CM | POA: Diagnosis not present

## 2014-10-09 LAB — POCT INR: INR: 2.3

## 2014-10-21 ENCOUNTER — Other Ambulatory Visit: Payer: Self-pay | Admitting: Cardiovascular Disease

## 2014-10-23 ENCOUNTER — Other Ambulatory Visit: Payer: Self-pay | Admitting: Cardiovascular Disease

## 2014-11-09 ENCOUNTER — Encounter: Payer: Self-pay | Admitting: Internal Medicine

## 2014-11-09 ENCOUNTER — Ambulatory Visit (INDEPENDENT_AMBULATORY_CARE_PROVIDER_SITE_OTHER): Payer: Medicare HMO | Admitting: *Deleted

## 2014-11-09 DIAGNOSIS — I495 Sick sinus syndrome: Secondary | ICD-10-CM

## 2014-11-09 NOTE — Progress Notes (Signed)
Remote pacemaker transmission.   

## 2014-11-10 LAB — CUP PACEART REMOTE DEVICE CHECK
Brady Statistic AP VP Percent: 67 %
Brady Statistic AP VS Percent: 31 %
Brady Statistic AS VP Percent: 1 %
Brady Statistic AS VS Percent: 2.1 %
Brady Statistic RV Percent Paced: 67 %
Date Time Interrogation Session: 20161010120819
Lead Channel Impedance Value: 410 Ohm
Lead Channel Pacing Threshold Amplitude: 0.875 V
Lead Channel Pacing Threshold Pulse Width: 0.4 ms
Lead Channel Sensing Intrinsic Amplitude: 7 mV
Lead Channel Setting Pacing Amplitude: 1.125
Lead Channel Setting Pacing Amplitude: 1.625
Lead Channel Setting Pacing Pulse Width: 0.4 ms
MDC IDC MSMT BATTERY REMAINING LONGEVITY: 116 mo
MDC IDC MSMT BATTERY REMAINING PERCENTAGE: 91 %
MDC IDC MSMT BATTERY VOLTAGE: 2.95 V
MDC IDC MSMT LEADCHNL RA IMPEDANCE VALUE: 460 Ohm
MDC IDC MSMT LEADCHNL RA PACING THRESHOLD AMPLITUDE: 0.625 V
MDC IDC MSMT LEADCHNL RA PACING THRESHOLD PULSEWIDTH: 0.4 ms
MDC IDC MSMT LEADCHNL RA SENSING INTR AMPL: 2.6 mV
MDC IDC PG SERIAL: 7322329
MDC IDC SET LEADCHNL RV SENSING SENSITIVITY: 2 mV
MDC IDC STAT BRADY RA PERCENT PACED: 97 %

## 2014-11-23 ENCOUNTER — Ambulatory Visit (INDEPENDENT_AMBULATORY_CARE_PROVIDER_SITE_OTHER): Payer: Medicare HMO | Admitting: Pharmacist

## 2014-11-23 DIAGNOSIS — I4891 Unspecified atrial fibrillation: Secondary | ICD-10-CM | POA: Diagnosis not present

## 2014-11-23 DIAGNOSIS — Z7901 Long term (current) use of anticoagulants: Secondary | ICD-10-CM

## 2014-11-23 LAB — POCT INR: INR: 3.1

## 2014-12-23 ENCOUNTER — Encounter: Payer: Self-pay | Admitting: *Deleted

## 2015-01-04 ENCOUNTER — Ambulatory Visit (INDEPENDENT_AMBULATORY_CARE_PROVIDER_SITE_OTHER): Payer: Medicare HMO | Admitting: Pharmacist

## 2015-01-04 DIAGNOSIS — I4891 Unspecified atrial fibrillation: Secondary | ICD-10-CM | POA: Diagnosis not present

## 2015-01-04 DIAGNOSIS — Z7901 Long term (current) use of anticoagulants: Secondary | ICD-10-CM

## 2015-01-04 LAB — POCT INR: INR: 2.1

## 2015-02-08 ENCOUNTER — Telehealth: Payer: Self-pay | Admitting: Cardiology

## 2015-02-08 ENCOUNTER — Ambulatory Visit (INDEPENDENT_AMBULATORY_CARE_PROVIDER_SITE_OTHER): Payer: Medicare HMO | Admitting: *Deleted

## 2015-02-08 DIAGNOSIS — I495 Sick sinus syndrome: Secondary | ICD-10-CM | POA: Diagnosis not present

## 2015-02-08 NOTE — Telephone Encounter (Signed)
Spoke with pt and reminded pt of remote transmission that is due today. Pt verbalized understanding.   

## 2015-02-09 NOTE — Telephone Encounter (Signed)
Spoke w/ pt and after several attempts at trying to trouble shooting home monitor. I was unable to help pt w/ this. Instructed pt that he would have to call tech services to receive further. Pt verbalized understanding.

## 2015-02-09 NOTE — Telephone Encounter (Signed)
Please call patient. He had difficulties transmitting Needs for someone to contact him.  He is at home but may go out.  Call his cell hone if you can not reach him at home

## 2015-02-10 NOTE — Progress Notes (Signed)
Remote pacemaker transmission.   

## 2015-02-15 ENCOUNTER — Ambulatory Visit (INDEPENDENT_AMBULATORY_CARE_PROVIDER_SITE_OTHER): Payer: Medicare HMO

## 2015-02-15 ENCOUNTER — Telehealth: Payer: Self-pay

## 2015-02-15 DIAGNOSIS — I4891 Unspecified atrial fibrillation: Secondary | ICD-10-CM | POA: Diagnosis not present

## 2015-02-15 DIAGNOSIS — Z7901 Long term (current) use of anticoagulants: Secondary | ICD-10-CM | POA: Diagnosis not present

## 2015-02-15 LAB — POCT INR: INR: 2.4

## 2015-02-15 NOTE — Telephone Encounter (Signed)
Pt received letter for 6 mo f/u appt in Feb, pt tried to schedule last month but no appts available awaiting MD schedule release.  Looked today for appt for pt nothing available until April.  Pt wants sooner appt, has had some episodic CP which is relieved with NTG, but wants to see Dr Angelena Form sooner than April, not having acute CP at present.

## 2015-02-17 NOTE — Telephone Encounter (Signed)
Left message to call back  

## 2015-02-17 NOTE — Telephone Encounter (Signed)
Spoke with pt. He reports pain is the same he has been having for a long time.  Has not increased or changed since last seen in office.  Sometimes NTG will help. Other times no change with use of NTG.   Appt made for him to see Dr. Angelena Form on February 17,2017 at 9:15 for 6 month follow up.

## 2015-02-17 NOTE — Telephone Encounter (Signed)
Follow up  ° ° ° °Returning call back to nurse  °

## 2015-02-22 ENCOUNTER — Telehealth: Payer: Self-pay | Admitting: Cardiovascular Disease

## 2015-02-22 NOTE — Telephone Encounter (Signed)
Spoke with pt. He has appt with Dr. Angelena Form on 03/19/15. Dr Angelena Form has had a change in schedule and will not be in office that day.  Appt made for pt to see Dr. Angelena Form on 02/23/15 at 10:45.

## 2015-02-22 NOTE — Telephone Encounter (Signed)
New message     Patient wife calling back to speak with nurse

## 2015-02-23 ENCOUNTER — Encounter: Payer: Self-pay | Admitting: Cardiovascular Disease

## 2015-02-23 ENCOUNTER — Ambulatory Visit (INDEPENDENT_AMBULATORY_CARE_PROVIDER_SITE_OTHER): Payer: Medicare HMO | Admitting: Cardiovascular Disease

## 2015-02-23 VITALS — BP 130/80 | HR 60 | Ht 66.0 in | Wt 198.8 lb

## 2015-02-23 DIAGNOSIS — I480 Paroxysmal atrial fibrillation: Secondary | ICD-10-CM | POA: Diagnosis not present

## 2015-02-23 DIAGNOSIS — I25119 Atherosclerotic heart disease of native coronary artery with unspecified angina pectoris: Secondary | ICD-10-CM

## 2015-02-23 DIAGNOSIS — I10 Essential (primary) hypertension: Secondary | ICD-10-CM

## 2015-02-23 MED ORDER — ISOSORBIDE MONONITRATE ER 30 MG PO TB24: 30.0000 mg | ORAL_TABLET | Freq: Every day | ORAL | Status: DC

## 2015-02-23 NOTE — Patient Instructions (Signed)
Medication Instructions:  Your physician has recommended you make the following change in your medication:  Start Isosorbide mononitrate 30 mg by mouth daily   Labwork: none  Testing/Procedures: none  Follow-Up: Your physician wants you to follow-up in: 6 months.  You will receive a reminder letter in the mail two months in advance. If you don't receive a letter, please call our office to schedule the follow-up appointment.   Any Other Special Instructions Will Be Listed Below (If Applicable).     If you need a refill on your cardiac medications before your next appointment, please call your pharmacy.

## 2015-02-23 NOTE — Progress Notes (Signed)
Chief Complaint  Patient presents with  . Follow-up    pt has chest pain  . Coronary Artery Disease  . Atrial Fibrillation      History of Present Illness: 75 yo WM with history of CAD s/p 5V CABG 2005, atrial fibrillation/flutter, HTN, HLD, hyperkalemia, obesity, chronic back and shoulder pain who is here today for cardiac follow up. Echo September 2010 showed EF 60-65% mild AI/TR. Stress Myoview 6/11 EF normal. Normal perfusion. I saw him 01/10/11 for the first time. His EKG showed atrial flutter. I started Cardizem CD 120 mg po Qdaily and xarelto. I had him wear a monitor for 48 hours. He had sinus rhythm with episodes of atrial flutter. He also had 4 second pauses. His cardizem was held and Coreg was lowered. He saw Dr. Caryl Comes in April 2013 and was started on dofetilide for atrial fibrillation. He had post-termination pauses and had a pacemaker implanted in April 2013. He has since had Tikosyn stopped due to cost and was started on sotalol. He was switched from Xarelto to coumadin. Echo 10/29/12 with LVEF=55-60%, mild LVH, no significant valve disease. I saw him in February 2015 and he had chest pain c/w musculoskeletal etiology, clearly worse with movement. He was seen by Dr. Caryl Comes 07/31/13 and c/o lightheadedness. Coreg lowered to 6.25 mg po BID. Also c/o chest discomfort. Stress myoview 09/01/13 with no ischemia. He was seen in our office May 2016 by Richardson Dopp, PA-C with c/o chest pain. Stress myoview was ordered but was cancelled by patient due to cost.   He is here today for follow up. He continues to have daily chest pains. Last for a few seconds. Occurs with rest and exertion. No SOB. No palpitations, dizziness, near syncope or syncope. Overall feeling well. He does not think there is a change in his chest pain over last year.   Primary Care Physician: [name]  Last Lipid Profile: Followed in primary care  Past Medical History  Diagnosis Date  . Atrial fibrillation/flutter   .  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 Sinus node dysfunction (HCC)     post termination pauses <5sec  . Hyperlipidemia   . Hypertension   . Central obesity   . Gout     "once; I have it under control w/medicine"  . BPH (benign prostatic hyperplasia)   . Chronic back pain   . Angina   . Arthritis     "in my back"  . Pacemaker     Past Surgical History  Procedure Laterality Date  . Cholecystectomy  1978   . Cardiac catheterization  2005    "that what sent me to CABG"  . Cataract extraction w/ intraocular lens  implant, bilateral  ~ 01/2011  . Coronary artery bypass graft  2005    in [place] - LIMA to LAD, SVG to diagonal, SVG to ramus, SVG to OM and SVG to RCA   . Pacemaker insertion  2014  . Transurethral resection of prostate N/A 12/29/2013    Procedure: TRANSURETHRAL RESECTION OF THE PROSTATE WITH GYRUS INSTRUMENTS;  Surgeon: [name], MD;  Location: [facility];  Service: Urology;  Laterality: N/A;  . Permanent pacemaker insertion N/A 05/25/2011    Procedure: PERMANENT PACEMAKER INSERTION;  Surgeon: [name], MD;  Location: [facility] CATH LAB;  Service: Cardiovascular;  Laterality: N/A;  Current Outpatient Prescriptions  Medication Sig Dispense Refill  . allopurinol (ZYLOPRIM) 300 MG tablet Take 300 mg by mouth every morning.     Marland Kitchen atorvastatin (LIPITOR) 40 MG tablet Take 1 tablet (40 mg total) by mouth every evening. 90 tablet 1  . carvedilol (COREG) 3.125 MG tablet TAKE 1 TABLET TWICE DAILY WITH MEAL 180 tablet 3  . cholecalciferol (VITAMIN D) 1000 UNITS tablet Take 1,000 Units by mouth daily.    Marland Kitchen docusate sodium 100 MG CAPS Take 100 mg by mouth 2 (two) times daily. 10 capsule 0  . lisinopril (PRINIVIL,ZESTRIL) 20 MG tablet Take 20 mg by mouth at bedtime.     Marland Kitchen NITROSTAT 0.4 MG SL tablet DISSOLVE ONE TABLET UNDER THE TONGUE EVERY 5 MINUTES AS NEEDED FOR CHEST  PAIN. DO NOT EXCEED A TOTAL OF 3 DOSES IN 15 MINUTES 25 tablet 3  . sotalol (BETAPACE) 120 MG tablet TAKE 1 TABLET EVERY 12 HOURS 180 tablet 3  . warfarin (COUMADIN) 5 MG tablet TAKE 1 TABLET (5 MG TOTAL) AS DIRECTED OR AS DIRECTED BY THE COUMADIN CLINIC 60 tablet 3  . isosorbide mononitrate (IMDUR) 30 MG 24 hr tablet Take 1 tablet (30 mg total) by mouth daily. 30 tablet 11   No current facility-administered medications for this visit.    Allergies  Allergen Reactions  . Sulfur Swelling    SWELLING ON PATIENTS LIPS, HE GETS DRIED UP    Social History   Social History  . Marital Status: Married    Spouse Name: N/A  . Number of Children: 2  . Years of Education: N/A   Occupational History  . retired-truck driver   .     Social History Main Topics  . Smoking status: Former Smoker -- 0.00 packs/day for 0 years    Types: Cigarettes    Quit date: 01/30/1961  . Smokeless tobacco: Never Used  . Alcohol Use: No     Comment: 2 drinks (bourbon) a night  . Drug Use: No  . Sexual Activity: Yes   Other Topics Concern  . Not on file   Social History Narrative   Married. Pt works part-time for a [employer]. Quit tobacco 50 years ago..Both parents are deceased, neither one had cardiac issues and no siblings have cardiac issues.     Family History  Problem Relation Age of Onset  . Cancer Mother   . Heart attack Neg Hx   . Stroke Brother     Review of Systems:  As stated in the HPI and otherwise negative.   BP 130/80 mmHg  Pulse 60  Ht 5\' 6"  (1.676 m)  Wt 198 lb 12.8 oz (90.175 kg)  BMI 32.10 kg/m2  SpO2 98%  Physical Examination: General: Well developed, well nourished, NAD HEENT: OP clear, mucus membranes moist SKIN: warm, dry. No rashes. Neuro: No focal deficits Musculoskeletal: Muscle strength 5/5 all ext Psychiatric: Mood and affect normal Neck: No JVD, no carotid bruits, no thyromegaly, no lymphadenopathy. Lungs:Clear bilaterally, no wheezes, rhonci,  crackles Cardiovascular: Regular rate and rhythm. No murmurs, gallops or rubs. Abdomen:Soft. Bowel sounds present. Non-tender.  Extremities: No lower extremity edema. Pulses are 2 + in the bilateral DP/PT.  Echo 09/3012: Left ventricle: The cavity size was normal. Wall thickness was increased in a pattern of mild LVH. Systolic function was normal. The estimated ejection fraction was in the range of 55% to 60%. Wall motion was normal; there were no regional wall motion abnormalities. Doppler parameters are consistent with abnormal  left ventricular relaxation (grade 1 diastolic dysfunction).  Stress myoview 09/01/13: Stress Procedure: The patient received IV adenosine at 140 mcg/kg/min for 4 minutes. Technetium 37m Sestamibi was injected at the 2 minute mark and quantitative spect images were obtained after a 45 minute delay.  Stress ECG: intermittent v-pacing was noted  QPS  Raw Data Images: Mild diaphragmatic attenuation. Normal left ventricular size.  Stress Images: There is decreased uptake in the inferior wall.  Rest Images: There is decreased uptake in the inferior wall.  Subtraction (SDS): No evidence of ischemia.  Transient Ischemic Dilatation (Normal <1.22): 1.08  Lung/Heart Ratio (Normal <0.45): 0.32  Quantitative Gated Spect Images  QGS EDV: 82 ml  QGS ESV: 38 ml  Impression  Exercise Capacity: Adenosine study with no exercise.  BP Response: Hypotensive blood pressure response.  Clinical Symptoms: No significant symptoms noted.  ECG Impression: Non-diagnostic, intermittent v-pacing  Comparison with Prior Nuclear Study: No significant change from previous study  Overall Impression: Low risk stress nuclear study with inferior bowel attenuation artifact. No ischemia.  LV Ejection Fraction: 53%. LV Wall Motion: NL LV Function; NL Wall Motion  EKG:  EKG is ordered today. The ekg ordered today demonstrates atrial paced rhythm. Chronic ST and T wave changes.   Recent  Labs: 08/07/2014: BUN 22; Creatinine, Ser 1.00; Magnesium 2.0; Potassium 4.4; Sodium 137   Lipid Panel Followed in primary care   Wt Readings from Last 3 Encounters:  02/23/15 198 lb 12.8 oz (90.175 kg)  09/21/14 197 lb (89.359 kg)  08/07/14 197 lb 12.8 oz (89.721 kg)     Other studies Reviewed: Additional studies/ records that were reviewed today include: . Review of the above records demonstrates:   Assessment and Plan:   1. CAD: Stable. His chest pain seems to be musculoskeletal. The pain is localized over his left chest wall in the same spot as last visit. Only occurs with movement. Worsened with palpation and with movement of his left arm and shoulder. No exertional component. I will start Imdur 30 mg daily to see if this helps. Stress test in August 2015 did not show ischemia. He is on good medical therapy including statin, beta blocker. He has had no cath since 2005. I have encouraged him to let me know if the chest pain changes in quality. He is not on an ASA secondary to his need for coumadin. Lipids followed in primary care office.   2. Atrial fibrillation, paroxysmal: He is on sotalol and coumadin per Dr. Caryl Comes. Pacemaker in place.   3. HTN: BP controlled. No changes today.   Current medicines are reviewed at length with the patient today.  The patient does not have concerns regarding medicines.  The following changes have been made:  no change  Labs/ tests ordered today include:   Orders Placed This Encounter  Procedures  . EKG 12-Lead    Disposition:   FU with me in 6 months  Signed, Lauree Chandler, MD 02/23/2015 11:55 AM    [facility] [address], [place], [place]  [address] Phone: [phone]; Fax: [phone]

## 2015-02-28 LAB — CUP PACEART REMOTE DEVICE CHECK
Battery Remaining Percentage: 91 %
Battery Voltage: 2.95 V
Brady Statistic AS VP Percent: 1 %
Brady Statistic AS VS Percent: 2.7 %
Brady Statistic RA Percent Paced: 97 %
Implantable Lead Implant Date: 20130425
Implantable Lead Location: 753859
Implantable Lead Location: 753860
Lead Channel Impedance Value: 410 Ohm
Lead Channel Pacing Threshold Amplitude: 0.625 V
Lead Channel Pacing Threshold Amplitude: 0.875 V
Lead Channel Pacing Threshold Pulse Width: 0.4 ms
Lead Channel Sensing Intrinsic Amplitude: 2.5 mV
Lead Channel Sensing Intrinsic Amplitude: 8.4 mV
Lead Channel Setting Pacing Amplitude: 1.625
MDC IDC LEAD IMPLANT DT: 20130425
MDC IDC MSMT BATTERY REMAINING LONGEVITY: 116 mo
MDC IDC MSMT LEADCHNL RA IMPEDANCE VALUE: 480 Ohm
MDC IDC MSMT LEADCHNL RV PACING THRESHOLD PULSEWIDTH: 0.4 ms
MDC IDC SESS DTM: 20170110170333
MDC IDC SET LEADCHNL RV PACING AMPLITUDE: 1.125
MDC IDC SET LEADCHNL RV PACING PULSEWIDTH: 0.4 ms
MDC IDC SET LEADCHNL RV SENSING SENSITIVITY: 2 mV
MDC IDC STAT BRADY AP VP PERCENT: 65 %
MDC IDC STAT BRADY AP VS PERCENT: 32 %
MDC IDC STAT BRADY RV PERCENT PACED: 65 %
Pulse Gen Serial Number: 7322329

## 2015-03-03 ENCOUNTER — Encounter: Payer: Self-pay | Admitting: Cardiology

## 2015-03-08 ENCOUNTER — Other Ambulatory Visit: Payer: Self-pay | Admitting: Cardiovascular Disease

## 2015-03-15 ENCOUNTER — Other Ambulatory Visit: Payer: Self-pay | Admitting: *Deleted

## 2015-03-15 DIAGNOSIS — I25119 Atherosclerotic heart disease of native coronary artery with unspecified angina pectoris: Secondary | ICD-10-CM

## 2015-03-15 MED ORDER — ISOSORBIDE MONONITRATE ER 30 MG PO TB24: 30.0000 mg | ORAL_TABLET | Freq: Every day | ORAL | Status: DC

## 2015-03-19 ENCOUNTER — Ambulatory Visit: Payer: Medicare HMO | Admitting: Cardiovascular Disease

## 2015-03-29 ENCOUNTER — Ambulatory Visit (INDEPENDENT_AMBULATORY_CARE_PROVIDER_SITE_OTHER): Payer: Medicare HMO | Admitting: *Deleted

## 2015-03-29 DIAGNOSIS — Z7901 Long term (current) use of anticoagulants: Secondary | ICD-10-CM | POA: Diagnosis not present

## 2015-03-29 DIAGNOSIS — I4891 Unspecified atrial fibrillation: Secondary | ICD-10-CM | POA: Diagnosis not present

## 2015-03-29 LAB — POCT INR: INR: 2.8

## 2015-05-10 ENCOUNTER — Ambulatory Visit (INDEPENDENT_AMBULATORY_CARE_PROVIDER_SITE_OTHER): Payer: Medicare HMO

## 2015-05-10 DIAGNOSIS — I4891 Unspecified atrial fibrillation: Secondary | ICD-10-CM

## 2015-05-10 DIAGNOSIS — Z7901 Long term (current) use of anticoagulants: Secondary | ICD-10-CM

## 2015-05-10 LAB — POCT INR: INR: 3.4

## 2015-05-12 ENCOUNTER — Telehealth: Payer: Self-pay | Admitting: Cardiology

## 2015-05-12 ENCOUNTER — Ambulatory Visit: Payer: Medicare HMO | Admitting: *Deleted

## 2015-05-12 NOTE — Telephone Encounter (Signed)
LMOVM reminding pt to send remote transmission.   

## 2015-05-12 NOTE — Progress Notes (Signed)
Remote pacemaker transmission.   

## 2015-06-07 ENCOUNTER — Ambulatory Visit (INDEPENDENT_AMBULATORY_CARE_PROVIDER_SITE_OTHER): Payer: Medicare HMO | Admitting: *Deleted

## 2015-06-07 ENCOUNTER — Encounter: Payer: Self-pay | Admitting: Internal Medicine

## 2015-06-07 DIAGNOSIS — I495 Sick sinus syndrome: Secondary | ICD-10-CM

## 2015-06-07 DIAGNOSIS — Z7901 Long term (current) use of anticoagulants: Secondary | ICD-10-CM

## 2015-06-07 DIAGNOSIS — I4891 Unspecified atrial fibrillation: Secondary | ICD-10-CM

## 2015-06-07 DIAGNOSIS — I480 Paroxysmal atrial fibrillation: Secondary | ICD-10-CM

## 2015-06-07 LAB — CUP PACEART INCLINIC DEVICE CHECK
Implantable Lead Implant Date: 20130425
Implantable Lead Implant Date: 20130425
Implantable Lead Location: 753859
Lead Channel Impedance Value: 475 Ohm
Lead Channel Pacing Threshold Amplitude: 1 V
Lead Channel Pacing Threshold Amplitude: 1 V
Lead Channel Pacing Threshold Pulse Width: 0.4 ms
Lead Channel Pacing Threshold Pulse Width: 0.4 ms
Lead Channel Pacing Threshold Pulse Width: 0.4 ms
Lead Channel Sensing Intrinsic Amplitude: 2.7 mV
Lead Channel Sensing Intrinsic Amplitude: 8.2 mV
Lead Channel Setting Pacing Amplitude: 1.25 V
Lead Channel Setting Pacing Amplitude: 1.625
Lead Channel Setting Pacing Pulse Width: 0.4 ms
MDC IDC LEAD LOCATION: 753860
MDC IDC MSMT BATTERY REMAINING LONGEVITY: 99.6
MDC IDC MSMT BATTERY VOLTAGE: 2.93 V
MDC IDC MSMT LEADCHNL RA PACING THRESHOLD AMPLITUDE: 0.5 V
MDC IDC MSMT LEADCHNL RA PACING THRESHOLD AMPLITUDE: 0.5 V
MDC IDC MSMT LEADCHNL RA PACING THRESHOLD PULSEWIDTH: 0.4 ms
MDC IDC MSMT LEADCHNL RV IMPEDANCE VALUE: 425 Ohm
MDC IDC SESS DTM: 20170508135253
MDC IDC SET LEADCHNL RV SENSING SENSITIVITY: 2 mV
MDC IDC STAT BRADY RA PERCENT PACED: 96 %
MDC IDC STAT BRADY RV PERCENT PACED: 64 %
Pulse Gen Serial Number: 7322329

## 2015-06-07 LAB — POCT INR: INR: 2.6

## 2015-06-07 NOTE — Progress Notes (Signed)
Pacemaker check in clinic due to c/o pain around ppm. Patient stated that he noticed the discomfort after he removed a branch from his driveway. He stated that the pain was near his axilla and hurt only with position changes. I informed patient that his pain sounded like it was muscle related and would likely go away with time. I encouraged patient to call if the pain gets worse. Patient voiced understanding.  Device site is stable. Site is free of ecchymosis or edema. Incision edges remain approximated.  Pacemaker checked in office. Normal device function. Thresholds, sensing, impedances consistent with previous measurements. Device programmed to maximize longevity. (<1%) AF burden + Betapace/Warfarin. No high ventricular rates noted. Device programmed at appropriate safety margins. Histogram distribution appropriate for patient activity level. Device programmed to optimize intrinsic conduction. Estimated longevity 8.3-9.2 years. Patient will follow up with SK in 07-2015.

## 2015-07-08 IMAGING — CR DG CHEST 2V
2 series · 2 of 2 positions shown · non-contrast
Comparison: Portable chest x-ray dated September 22, 2011

CLINICAL DATA: Preop exam prior to TURP next week ; history of
previous CABG and pacemaker placement; occasional chest pain

EXAM:
CHEST  2 VIEW

[w chest pa]
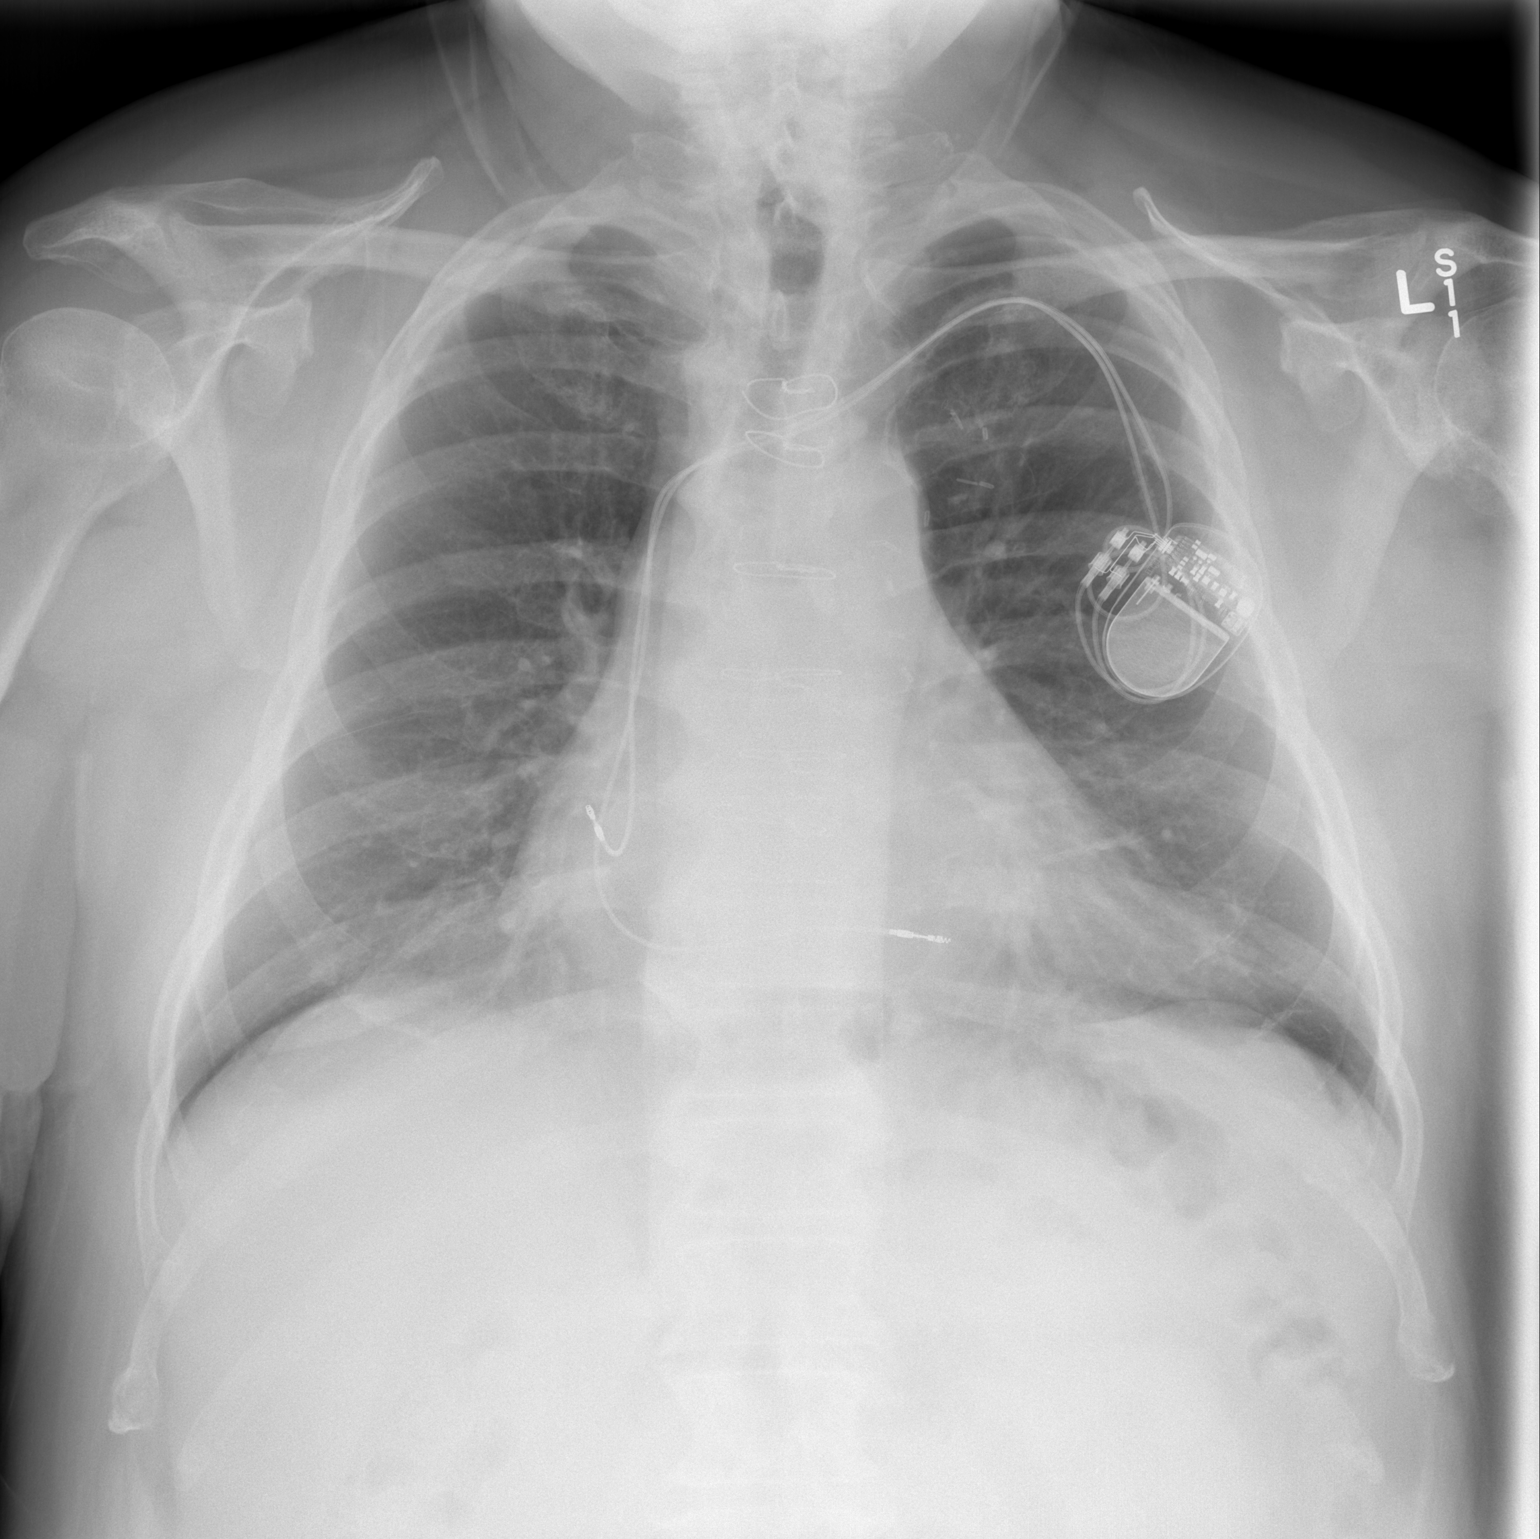

[w chest lat]
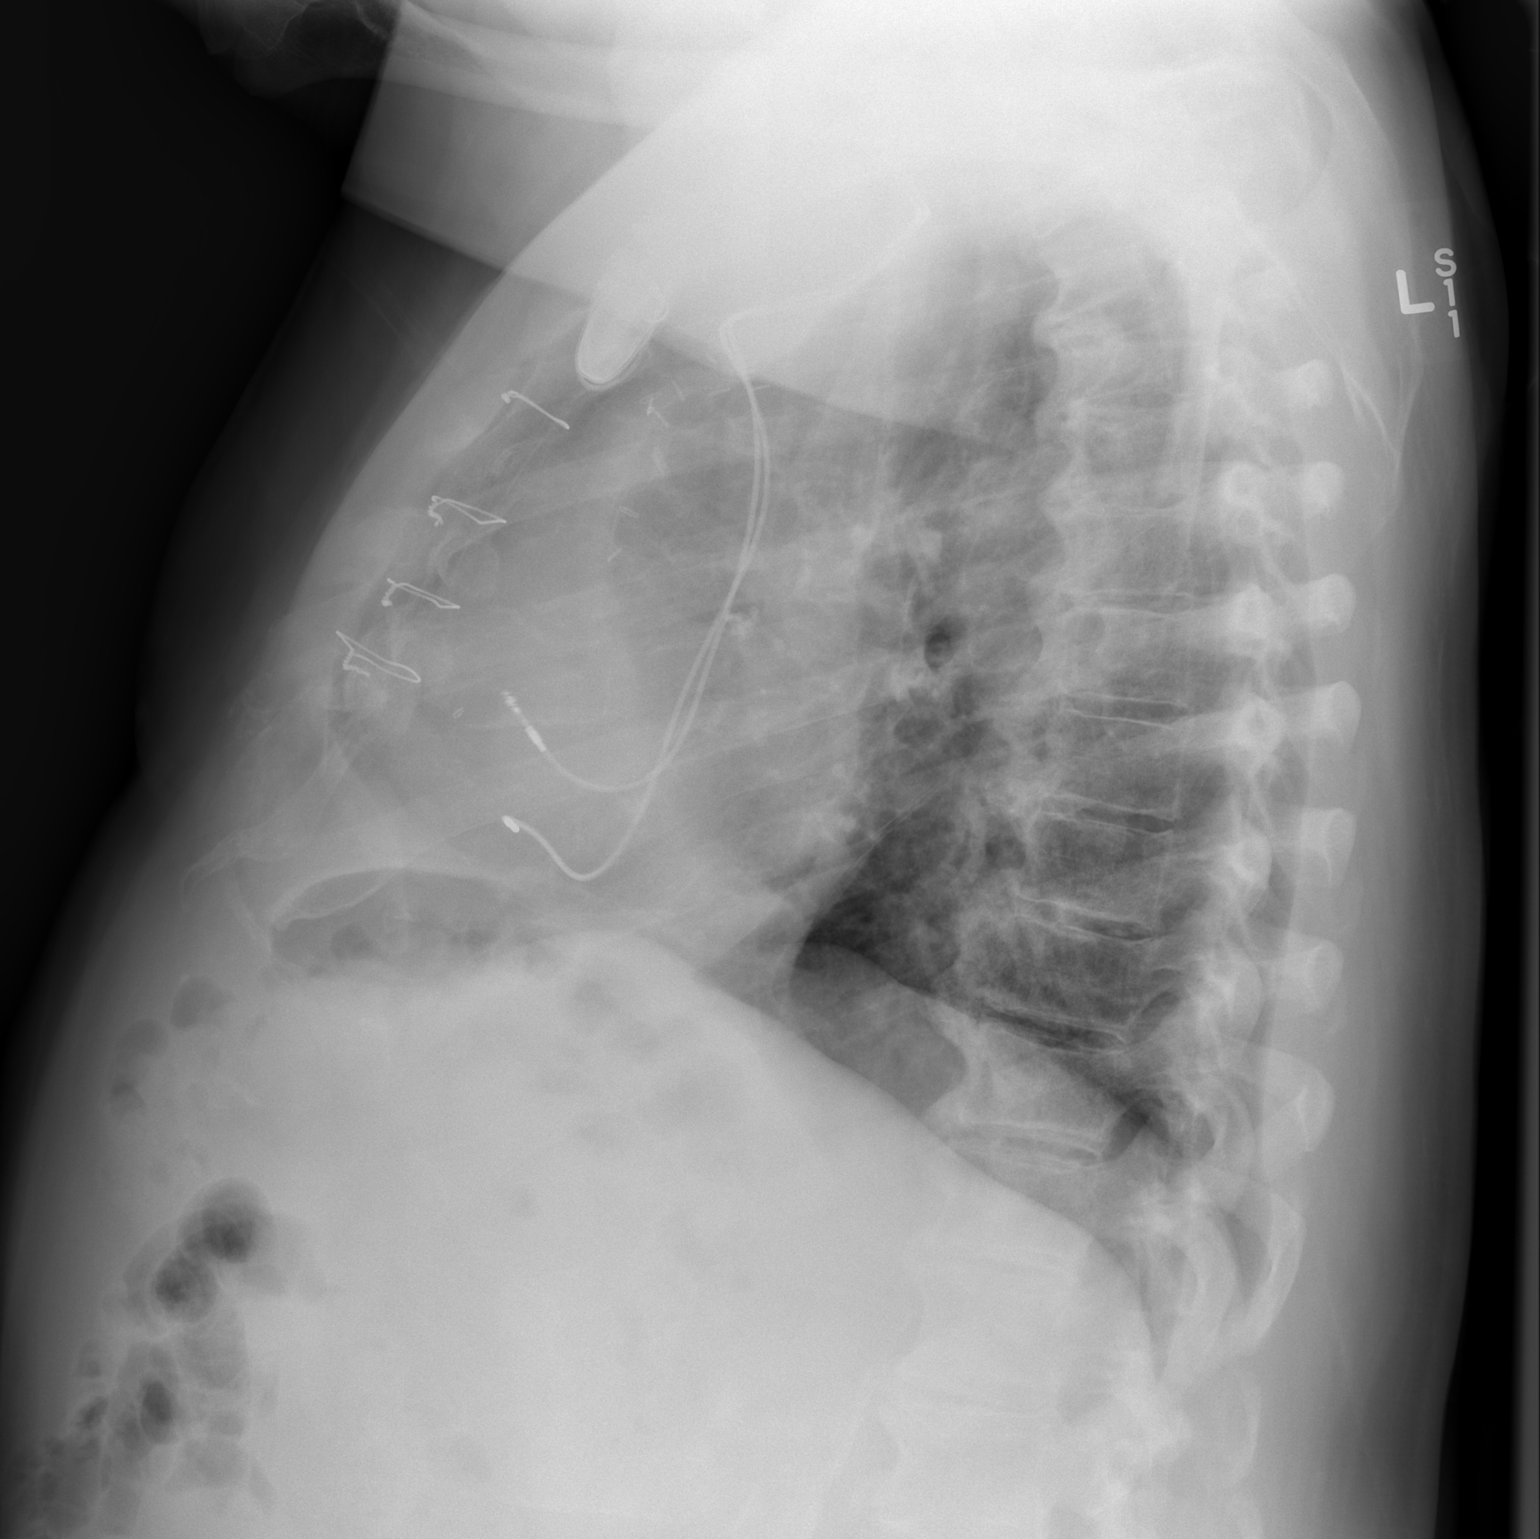

[2 of 2 positions shown; findings below may reference images not displayed]

FINDINGS: The lungs are adequately inflated and clear. The cardiac silhouette
is mildly enlarged. The pulmonary vascularity is normal. There are
stable surgical clips in the left suprahilar region. The permanent
pacemaker is in appropriate position radiographically. The sternal
wires are intact where visualized. There is calcification of the
anterior longitudinal ligament of the thoracic spine.
IMPRESSION: There is mild enlargement the cardiac silhouette and post CABG
changes. There is no evidence of CHF, pneumonia, nor other acute
cardiopulmonary abnormality.

## 2015-07-12 ENCOUNTER — Ambulatory Visit (INDEPENDENT_AMBULATORY_CARE_PROVIDER_SITE_OTHER): Payer: Medicare HMO

## 2015-07-12 DIAGNOSIS — Z7901 Long term (current) use of anticoagulants: Secondary | ICD-10-CM

## 2015-07-12 DIAGNOSIS — I4891 Unspecified atrial fibrillation: Secondary | ICD-10-CM

## 2015-07-12 LAB — POCT INR: INR: 3

## 2015-08-10 ENCOUNTER — Ambulatory Visit (INDEPENDENT_AMBULATORY_CARE_PROVIDER_SITE_OTHER): Payer: Medicare HMO | Admitting: Internal Medicine

## 2015-08-10 ENCOUNTER — Encounter (INDEPENDENT_AMBULATORY_CARE_PROVIDER_SITE_OTHER): Payer: Self-pay

## 2015-08-10 ENCOUNTER — Encounter: Payer: Self-pay | Admitting: Internal Medicine

## 2015-08-10 VITALS — BP 128/64 | HR 61 | Ht 66.0 in | Wt 196.4 lb

## 2015-08-10 DIAGNOSIS — I495 Sick sinus syndrome: Secondary | ICD-10-CM

## 2015-08-10 DIAGNOSIS — I480 Paroxysmal atrial fibrillation: Secondary | ICD-10-CM

## 2015-08-10 NOTE — Progress Notes (Signed)
Patient Care Team: Jeannine Boga as PCP - General (Family Medicine)   HPI  Dylan Roth is a 75 y.o. male Seen in followup for atrial fibrillation with  posttermination pauses for which he underwent pacemaker implantation 4/13. He has been treated with sotalol and Rivaroxaban; he ended up back on warfarin because of bleeding issues  He has infrequent palpitations.   He has a history of ischemic heart disease with prior bypass surgery 2005. Ejection fraction by echo 9/14 was normal Stress myoview 7/15 with no ischemia  He continues with symptoms of chest pain. Myoview's have been suggested but not consummated because of cost. He saw Dr. Kendra Opitz 1/17. Empiric nitroglycerin was started. He was advised to let us know if there were any changes in his symptoms. 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 He is not sure whether there has been any significant change since last year.   Past Medical History  Diagnosis Date  . Atrial fibrillation/flutter   . 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 Sinus node dysfunction (HCC)     post termination pauses <5sec  . Hyperlipidemia   . Hypertension   . Central obesity   . Gout     "once; I have it under control w/medicine"  . BPH (benign prostatic hyperplasia)   . Chronic back pain   . Angina   . Arthritis     "in my back"  . Pacemaker     Past Surgical History  Procedure Laterality Date  . Cholecystectomy  1978   . Cardiac catheterization  2005    "that what sent me to CABG"  . Cataract extraction w/ intraocular lens  implant, bilateral  ~ 01/2011  . Coronary artery bypass graft  2005    in [place] - LIMA to LAD, SVG to diagonal, SVG to ramus, SVG to OM and SVG to RCA   . Pacemaker insertion  2014  . Transurethral resection of  prostate N/A 12/29/2013    Procedure: TRANSURETHRAL RESECTION OF THE PROSTATE WITH GYRUS INSTRUMENTS;  Surgeon: [name], MD;  Location: [facility];  Service: Urology;  Laterality: N/A;  . Permanent pacemaker insertion N/A 05/25/2011    Procedure: PERMANENT PACEMAKER INSERTION;  Surgeon: [name], MD;  Location: [facility] CATH LAB;  Service: Cardiovascular;  Laterality: N/A;    Current Outpatient Prescriptions  Medication Sig Dispense Refill  . allopurinol (ZYLOPRIM) 300 MG tablet Take 300 mg by mouth every morning.     Marland Kitchen atorvastatin (LIPITOR) 40 MG tablet Take 1 tablet (40 mg total) by mouth every evening. 90 tablet 1  . carvedilol (COREG) 3.125 MG tablet Take 3.125 mg by mouth 2 (two) times daily with a meal.    . cholecalciferol (VITAMIN D) 1000 UNITS tablet Take 1,000 Units by mouth daily.    Marland Kitchen docusate sodium 100 MG CAPS Take 100 mg by mouth 2 (two) times daily. 10 capsule 0  . isosorbide mononitrate (IMDUR) 30 MG 24 hr tablet Take 1 tablet (30 mg total) by mouth daily. 90 tablet 3  . lisinopril (PRINIVIL,ZESTRIL) 20 MG tablet Take 20 mg by mouth at bedtime.     Marland Kitchen NITROSTAT 0.4 MG SL tablet DISSOLVE ONE TABLET UNDER THE TONGUE EVERY  5 MINUTES AS NEEDED FOR CHEST PAIN. DO NOT EXCEED A TOTAL OF 3 DOSES IN 15 MINUTES 25 tablet 3  . sotalol (BETAPACE) 120 MG tablet Take 120 mg by mouth 2 (two) times daily.    Marland Kitchen warfarin (COUMADIN) 5 MG tablet TAKE 1 TABLET (5 MG) AS DIRECTED OR AS DIRECTED BY THE COUMADIN CLINIC 90 tablet 3   No current facility-administered medications for this visit.    Allergies  Allergen Reactions  . Sulfur Swelling    SWELLING ON PATIENTS LIPS, HE GETS DRIED UP    Review of Systems negative except from HPI and PMH  Physical Exam There were no vitals taken for this visit. Well developed and nourished in no acute distress HENT normal Neck supple with JVP-flat Clear Regular rate and rhythm, no murmurs or gallops Abd-soft with active BS No Clubbing cyanosis  edema Skin-warm and dry A & Oriented  Grossly normal sensory and motor function  ECG: AV pacing at 61 26/09/39 Inferolateral/anterolateral T wave inversions Unchanged from 1/17  Assessment and  Plan  Ischemic heart disease with prior bypass surgery  Exertional dyspnea-atypical  Sinus node dysfunction and chronotropic incompetence  Pacemaker-[employer] Jude The patient's device was interrogated.  The information was reviewed. No changes were made in the programming.     Anxiety   High Risk Medication Surveillance  Chest pain non cardiac   Blood pressure is well-controlled.  It is not clear that his dyspnea is an issue. I've asked him to be tuned to whether he notes changes in his functional status over the next weeks or months.  Chronotropic competence is restored with pacing. Heart rate excursion is excellent  We spent more than 50% of our >25 min visit in face to face counseling regarding the above  We will check sotalol surveillance laboratories

## 2015-08-10 NOTE — Patient Instructions (Addendum)
Medication Instructions:  Your physician recommends that you continue on your current medications as directed. Please refer to the Current Medication list given to you today.   Labwork: Your physician recommends that you return for lab work today: Dylan Roth  Will need a BMP/MAG every 6 month and results sent to Dr Caryl Comes [phone] DX long term use of medication    Testing/Procedures: None ordered   Follow-Up: Your physician wants you to follow-up in: 6 months with Dylan Bjork, PA and 12 months with Dr Dylan Roth will receive a reminder letter in the mail two months in advance. If you don't receive a letter, please call our office to schedule the follow-up appointment.  Remote monitoring is used to monitor your Pacemaker  from home. This monitoring reduces the number of office visits required to check your device to one time per year. It allows [place] to keep an eye on the functioning of your device to ensure it is working properly. You are scheduled for a device check from home on 11/09/15. You may send your transmission at any time that day. If you have a wireless device, the transmission will be sent automatically. After your physician reviews your transmission, you will receive a postcard with your next transmission date.     Any Other Special Instructions Will Be Listed Below (If Applicable).     If you need a refill on your cardiac medications before your next appointment, please call your pharmacy.

## 2015-08-11 LAB — CUP PACEART INCLINIC DEVICE CHECK
Implantable Lead Implant Date: 20130425
Implantable Lead Location: 753859
Lead Channel Pacing Threshold Amplitude: 1 V
Lead Channel Pacing Threshold Pulse Width: 0.4 ms
Lead Channel Sensing Intrinsic Amplitude: 2.1 mV
Lead Channel Sensing Intrinsic Amplitude: 7.2 mV
Lead Channel Setting Pacing Amplitude: 1.25 V
Lead Channel Setting Pacing Amplitude: 1.625
Lead Channel Setting Sensing Sensitivity: 2 mV
MDC IDC LEAD IMPLANT DT: 20130425
MDC IDC LEAD LOCATION: 753860
MDC IDC MSMT LEADCHNL RA PACING THRESHOLD AMPLITUDE: 0.5 V
MDC IDC MSMT LEADCHNL RV PACING THRESHOLD PULSEWIDTH: 0.4 ms
MDC IDC SESS DTM: 20170712082455
MDC IDC SET LEADCHNL RV PACING PULSEWIDTH: 0.4 ms
MDC IDC STAT BRADY RA PERCENT PACED: 95 %
MDC IDC STAT BRADY RV PERCENT PACED: 61 %
Pulse Gen Serial Number: 7322329

## 2015-08-11 LAB — MAGNESIUM: Magnesium: 1.9 mg/dL (ref 1.5–2.5)

## 2015-08-23 ENCOUNTER — Ambulatory Visit (INDEPENDENT_AMBULATORY_CARE_PROVIDER_SITE_OTHER): Payer: Medicare HMO

## 2015-08-23 DIAGNOSIS — I4891 Unspecified atrial fibrillation: Secondary | ICD-10-CM | POA: Diagnosis not present

## 2015-08-23 DIAGNOSIS — Z7901 Long term (current) use of anticoagulants: Secondary | ICD-10-CM

## 2015-08-23 LAB — POCT INR: INR: 2.8

## 2015-08-26 ENCOUNTER — Other Ambulatory Visit: Payer: Self-pay | Admitting: Cardiovascular Disease

## 2015-08-31 ENCOUNTER — Encounter: Payer: Self-pay | Admitting: Cardiovascular Disease

## 2015-09-03 ENCOUNTER — Ambulatory Visit: Payer: Medicare HMO | Admitting: Cardiovascular Disease

## 2015-09-15 NOTE — Progress Notes (Signed)
Chief Complaint  Patient presents with  . Atrial Fibrillation  . Atrial Flutter  . Chest Pain     History of Present Illness: 75 yo WM with history of CAD s/p 5V CABG 2005, atrial fibrillation/flutter, HTN, HLD, sinus node dysfunction s/p pacemaker implantation who is here today for cardiac follow up. Atrial flutter diagnosed in 2012. He has been followed by Dr. Caryl Comes. He saw Dr. Caryl Comes in April 2013 and was started on dofetilide for atrial fibrillation. He had post-termination pauses and had a pacemaker implanted in April 2013. He has since had Tikosyn stopped due to cost and was started on sotalol. He was switched from Xarelto to coumadin due to cost. Echo 10/29/12 with LVEF=55-60%, mild LVH, no significant valve disease. I saw him in February 2015 and he had chest pain c/w musculoskeletal etiology, clearly worse with movement. He was seen by Dr. Caryl Comes 07/31/13 and c/o lightheadedness. Coreg lowered to 6.25 mg po BID. Also c/o chest discomfort. Stress myoview 09/01/13 with no ischemia. He was seen in our office May 2016 by Richardson Dopp, PA-C with c/o chest pain. Stress myoview was ordered but was cancelled by patient due to cost. I saw him in January 2017 and he c/o daily sharp chest pains. Imdur was added.   He is here today for follow up. He continues to have chest pains several days per week. He notices this when he is anxious or stressed or when exerting himself. No SOB. No palpitations, dizziness, near syncope or syncope.   Primary Care Physician: [name] (Inactive)   Past Medical History:  Diagnosis Date  . Angina   . Arthritis    "in my back"  . Atrial fibrillation/flutter   . BPH (benign prostatic hyperplasia)   . CAD (coronary artery disease) Dec 2005   Hx MI. 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 Central obesity   . Chronic back pain   . Gout    "once; I have it under control w/medicine"  . Hyperlipidemia   .  Hypertension   . Pacemaker   . Sinus node dysfunction (HCC)    post termination pauses <5sec    Past Surgical History:  Procedure Laterality Date  . CARDIAC CATHETERIZATION  2005   "that what sent me to CABG"  . CATARACT EXTRACTION W/ INTRAOCULAR LENS  IMPLANT, BILATERAL  ~ 01/2011  . CHOLECYSTECTOMY  1978   . CORONARY ARTERY BYPASS GRAFT  2005   in [place] - LIMA to LAD, SVG to diagonal, SVG to ramus, SVG to OM and SVG to RCA   . PACEMAKER INSERTION  2014  . PERMANENT PACEMAKER INSERTION N/A 05/25/2011   Procedure: PERMANENT PACEMAKER INSERTION;  Surgeon: [name], MD;  Location: [facility] CATH LAB;  Service: Cardiovascular;  Laterality: N/A;  . TRANSURETHRAL RESECTION OF PROSTATE N/A 12/29/2013   Procedure: TRANSURETHRAL RESECTION OF THE PROSTATE WITH GYRUS INSTRUMENTS;  Surgeon: [name], MD;  Location: [facility];  Service: Urology;  Laterality: N/A;    Current Outpatient Prescriptions  Medication Sig Dispense Refill  . allopurinol (ZYLOPRIM) 300 MG tablet Take 300 mg by mouth every morning.     Marland Kitchen atorvastatin (LIPITOR) 40 MG tablet Take 1 tablet (40 mg total) by mouth every evening. 90 tablet 1  . carvedilol (COREG) 3.125 MG tablet Take 3.125 mg by mouth 2 (two) times daily with a meal.    . Cholecalciferol (VITAMIN D PO) Take 5,000  Units by mouth daily.    . isosorbide mononitrate (IMDUR) 30 MG 24 hr tablet Take 1 tablet (30 mg total) by mouth daily. 90 tablet 3  . lisinopril (PRINIVIL,ZESTRIL) 20 MG tablet Take 20 mg by mouth at bedtime.     Marland Kitchen NITROSTAT 0.4 MG SL tablet DISSOLVE ONE TABLET UNDER THE TONGUE EVERY 5 MINUTES AS NEEDED FOR CHEST PAIN. DO NOT EXCEED A TOTAL OF 3 DOSES IN 15 MINUTES 25 tablet 2  . sotalol (BETAPACE) 120 MG tablet Take 120 mg by mouth 2 (two) times daily.    Marland Kitchen warfarin (COUMADIN) 5 MG tablet TAKE 1 TABLET (5 MG) AS DIRECTED OR AS DIRECTED BY THE COUMADIN CLINIC 90 tablet 3   No current facility-administered medications for this visit.      Allergies  Allergen Reactions  . Sulfa Antibiotics Other (See Comments)    SWELLING ON PATIENTS LIPS, HE GETS DRIED UP    Social History   Social History  . Marital status: Married    Spouse name: N/A  . Number of children: 2  . Years of education: N/A   Occupational History  . retired-truck driver   .  Retired   Social History Main Topics  . Smoking status: Former Smoker    Packs/day: 0.00    Years: 0.00    Types: Cigarettes    Quit date: 01/30/1961  . Smokeless tobacco: Never Used  . Alcohol use No     Comment: 2 drinks (bourbon) a night  . Drug use: No  . Sexual activity: Yes   Other Topics Concern  . Not on file   Social History Narrative   Married. Pt works part-time for a [employer]. Quit tobacco 50 years ago..Both parents are deceased, neither one had cardiac issues and no siblings have cardiac issues.     Family History  Problem Relation Age of Onset  . Cancer Mother   . Stroke Brother   . Heart attack Neg Hx     Review of Systems:  As stated in the HPI and otherwise negative.   BP 110/62   Pulse 68   Ht 5\' 6"  (1.676 m)   Wt 197 lb 9.6 oz (89.6 kg)   SpO2 98%   BMI 31.89 kg/m   Physical Examination: General: Well developed, well nourished, NAD  HEENT: OP clear, mucus membranes moist  SKIN: warm, dry. No rashes. Neuro: No focal deficits  Musculoskeletal: Muscle strength 5/5 all ext  Psychiatric: Mood and affect normal  Neck: No JVD, no carotid bruits, no thyromegaly, no lymphadenopathy.  Lungs:Clear bilaterally, no wheezes, rhonci, crackles Cardiovascular: Regular rate and rhythm. No murmurs, gallops or rubs. Abdomen:Soft. Bowel sounds present. Non-tender.  Extremities: No lower extremity edema. Pulses are 2 + in the bilateral DP/PT.  Echo 09/3012: Left ventricle: The cavity size was normal. Wall thickness was increased in a pattern of mild LVH. Systolic function was normal. The estimated ejection fraction was in the range of 55% to  60%. Wall motion was normal; there were no regional wall motion abnormalities. Doppler parameters are consistent with abnormal left ventricular relaxation (grade 1 diastolic dysfunction).  Stress myoview 09/01/13: Stress Procedure: The patient received IV adenosine at 140 mcg/kg/min for 4 minutes. Technetium 37m Sestamibi was injected at the 2 minute mark and quantitative spect images were obtained after a 45 minute delay.  Stress ECG: intermittent v-pacing was noted  QPS  Raw Data Images: Mild diaphragmatic attenuation. Normal left ventricular size.  Stress Images: There is decreased  uptake in the inferior wall.  Rest Images: There is decreased uptake in the inferior wall.  Subtraction (SDS): No evidence of ischemia.  Transient Ischemic Dilatation (Normal <1.22): 1.08  Lung/Heart Ratio (Normal <0.45): 0.32  Quantitative Gated Spect Images  QGS EDV: 82 ml  QGS ESV: 38 ml  Impression  Exercise Capacity: Adenosine study with no exercise.  BP Response: Hypotensive blood pressure response.  Clinical Symptoms: No significant symptoms noted.  ECG Impression: Non-diagnostic, intermittent v-pacing  Comparison with Prior Nuclear Study: No significant change from previous study  Overall Impression: Low risk stress nuclear study with inferior bowel attenuation artifact. No ischemia.  LV Ejection Fraction: 53%. LV Wall Motion: NL LV Function; NL Wall Motion  EKG:  EKG is not ordered today. The ekg ordered today demonstrates   Recent Labs: 08/10/2015: Magnesium 1.9   Lipid Panel Followed in primary care   Wt Readings from Last 3 Encounters:  09/16/15 197 lb 9.6 oz (89.6 kg)  08/10/15 196 lb 6.4 oz (89.1 kg)  02/23/15 198 lb 12.8 oz (90.2 kg)     Other studies Reviewed: Additional studies/ records that were reviewed today include: . Review of the above records demonstrates:   Assessment and Plan:   1. CAD with unstable angina: He continues to have chest pain with exertion and at rest.  Minimal improvement with Imdur. He is on Coreg. His chest pain is worsening. This may represent unstable angina. Will plan cardiac cath at [facility] next week. Continue current therapy but hold coumadin starting today for cath. Pre-cath labs next week on Tuesday. Risks and benefits of cath reviewed   2. Atrial fibrillation, paroxysmal: He is on sotalol and coumadin per Dr. Caryl Comes. Pacemaker in place.   3. HTN: BP controlled. No changes today.   Current medicines are reviewed at length with the patient today.  The patient does not have concerns regarding medicines.  The following changes have been made:  no change  Labs/ tests ordered today include:   Orders Placed This Encounter  Procedures  . Basic Metabolic Panel (BMET)  . CBC w/Diff  . INR/PT    Disposition:   FU with me in 6 months  Signed, Lauree Chandler, MD 09/16/2015 1:23 PM    [facility] [place], [place], [place]  [address] Phone: [phone]; Fax: [phone]

## 2015-09-16 ENCOUNTER — Encounter: Payer: Self-pay | Admitting: Cardiovascular Disease

## 2015-09-16 ENCOUNTER — Encounter: Payer: Self-pay | Admitting: *Deleted

## 2015-09-16 ENCOUNTER — Ambulatory Visit (INDEPENDENT_AMBULATORY_CARE_PROVIDER_SITE_OTHER): Payer: Medicare HMO | Admitting: Cardiovascular Disease

## 2015-09-16 VITALS — BP 110/62 | HR 68 | Ht 66.0 in | Wt 197.6 lb

## 2015-09-16 DIAGNOSIS — I10 Essential (primary) hypertension: Secondary | ICD-10-CM | POA: Diagnosis not present

## 2015-09-16 DIAGNOSIS — I480 Paroxysmal atrial fibrillation: Secondary | ICD-10-CM

## 2015-09-16 DIAGNOSIS — I25110 Atherosclerotic heart disease of native coronary artery with unstable angina pectoris: Secondary | ICD-10-CM

## 2015-09-16 NOTE — Patient Instructions (Signed)
Medication Instructions:  1. PER DR. MCALHANY YOU WILL NEED TO START HOLDING YOUR COUMADIN AS OF TODAY  2. NO OTHER CHANGES ARE BEING MADE WITH YOUR MEDICATIONS  Labwork: 09/21/15 BMET, CBC W/DIFF, PT/INR  Testing/Procedures: Your physician has requested that you have a cardiac catheterization. Cardiac catheterization is used to diagnose and/or treat various heart conditions. Doctors may recommend this procedure for a number of different reasons. The most common reason is to evaluate chest pain. Chest pain can be a symptom of coronary artery disease (CAD), and cardiac catheterization can show whether plaque is narrowing or blocking your heart's arteries. This procedure is also used to evaluate the valves, as well as measure the blood flow and oxygen levels in different parts of your heart. For further information please visit [email or web address]. Please follow instruction sheet, as given.   Follow-Up: POST CATH FOLLOW UP WITH DR. Angelena Form OR ONE OF HIS TEAM MEMBERS  Any Other Special Instructions Will Be Listed Below (If Applicable).     If you need a refill on your cardiac medications before your next appointment, please call your pharmacy.

## 2015-09-21 ENCOUNTER — Other Ambulatory Visit (INDEPENDENT_AMBULATORY_CARE_PROVIDER_SITE_OTHER): Payer: Medicare HMO

## 2015-09-21 DIAGNOSIS — I25110 Atherosclerotic heart disease of native coronary artery with unstable angina pectoris: Secondary | ICD-10-CM

## 2015-09-21 DIAGNOSIS — I10 Essential (primary) hypertension: Secondary | ICD-10-CM

## 2015-09-21 DIAGNOSIS — I480 Paroxysmal atrial fibrillation: Secondary | ICD-10-CM

## 2015-09-21 LAB — CBC WITH DIFFERENTIAL/PLATELET
BASOS PCT: 1 %
Basophils Absolute: 61 cells/uL (ref 0–200)
EOS PCT: 4 %
Eosinophils Absolute: 244 cells/uL (ref 15–500)
HCT: 41.3 % (ref 38.5–50.0)
Hemoglobin: 14.2 g/dL (ref 13.2–17.1)
LYMPHS PCT: 26 %
Lymphs Abs: 1586 cells/uL (ref 850–3900)
MCH: 32.9 pg (ref 27.0–33.0)
MCHC: 34.4 g/dL (ref 32.0–36.0)
MCV: 95.6 fL (ref 80.0–100.0)
MONOS PCT: 6 %
MPV: 9.9 fL (ref 7.5–12.5)
Monocytes Absolute: 366 cells/uL (ref 200–950)
Neutro Abs: 3843 cells/uL (ref 1500–7800)
Neutrophils Relative %: 63 %
PLATELETS: 189 10*3/uL (ref 140–400)
RBC: 4.32 MIL/uL (ref 4.20–5.80)
RDW: 14.9 % (ref 11.0–15.0)
WBC: 6.1 10*3/uL (ref 3.8–10.8)

## 2015-09-21 LAB — BASIC METABOLIC PANEL
BUN: 16 mg/dL (ref 7–25)
CO2: 26 mmol/L (ref 20–31)
CREATININE: 0.94 mg/dL (ref 0.70–1.18)
Calcium: 8.5 mg/dL — ABNORMAL LOW (ref 8.6–10.3)
Chloride: 105 mmol/L (ref 98–110)
Glucose, Bld: 103 mg/dL — ABNORMAL HIGH (ref 65–99)
Potassium: 4.4 mmol/L (ref 3.5–5.3)
Sodium: 139 mmol/L (ref 135–146)

## 2015-09-21 LAB — PROTIME-INR
INR: 1.2 — AB
PROTHROMBIN TIME: 12.8 s — AB (ref 9.0–11.5)

## 2015-09-22 ENCOUNTER — Ambulatory Visit (HOSPITAL_COMMUNITY)
Admission: RE | Admit: 2015-09-22 | Discharge: 2015-09-22 | Disposition: A | Discharge status: Home / Self Care | Payer: Medicare HMO | Source: Ambulatory Visit | Attending: Cardiovascular Disease | Admitting: Cardiovascular Disease

## 2015-09-22 ENCOUNTER — Encounter (HOSPITAL_COMMUNITY)
Admission: RE | Disposition: A | Discharge status: Home / Self Care | Payer: Self-pay | Source: Ambulatory Visit | Attending: Cardiovascular Disease

## 2015-09-22 DIAGNOSIS — Z951 Presence of aortocoronary bypass graft: Secondary | ICD-10-CM | POA: Insufficient documentation

## 2015-09-22 DIAGNOSIS — I480 Paroxysmal atrial fibrillation: Secondary | ICD-10-CM | POA: Diagnosis not present

## 2015-09-22 DIAGNOSIS — E785 Hyperlipidemia, unspecified: Secondary | ICD-10-CM | POA: Diagnosis not present

## 2015-09-22 DIAGNOSIS — Z882 Allergy status to sulfonamides status: Secondary | ICD-10-CM | POA: Diagnosis not present

## 2015-09-22 DIAGNOSIS — I25110 Atherosclerotic heart disease of native coronary artery with unstable angina pectoris: Secondary | ICD-10-CM | POA: Diagnosis not present

## 2015-09-22 DIAGNOSIS — Z823 Family history of stroke: Secondary | ICD-10-CM | POA: Insufficient documentation

## 2015-09-22 DIAGNOSIS — Z87891 Personal history of nicotine dependence: Secondary | ICD-10-CM | POA: Diagnosis not present

## 2015-09-22 DIAGNOSIS — M1990 Unspecified osteoarthritis, unspecified site: Secondary | ICD-10-CM | POA: Diagnosis not present

## 2015-09-22 DIAGNOSIS — N400 Enlarged prostate without lower urinary tract symptoms: Secondary | ICD-10-CM | POA: Diagnosis not present

## 2015-09-22 DIAGNOSIS — I10 Essential (primary) hypertension: Secondary | ICD-10-CM | POA: Diagnosis not present

## 2015-09-22 DIAGNOSIS — G8929 Other chronic pain: Secondary | ICD-10-CM | POA: Insufficient documentation

## 2015-09-22 DIAGNOSIS — I252 Old myocardial infarction: Secondary | ICD-10-CM | POA: Insufficient documentation

## 2015-09-22 DIAGNOSIS — I2582 Chronic total occlusion of coronary artery: Secondary | ICD-10-CM | POA: Diagnosis not present

## 2015-09-22 DIAGNOSIS — M549 Dorsalgia, unspecified: Secondary | ICD-10-CM | POA: Diagnosis not present

## 2015-09-22 DIAGNOSIS — M109 Gout, unspecified: Secondary | ICD-10-CM | POA: Insufficient documentation

## 2015-09-22 DIAGNOSIS — I495 Sick sinus syndrome: Secondary | ICD-10-CM | POA: Insufficient documentation

## 2015-09-22 DIAGNOSIS — Z950 Presence of cardiac pacemaker: Secondary | ICD-10-CM | POA: Diagnosis not present

## 2015-09-22 DIAGNOSIS — Z7901 Long term (current) use of anticoagulants: Secondary | ICD-10-CM | POA: Diagnosis not present

## 2015-09-22 HISTORY — PX: CARDIAC CATHETERIZATION: SHX172

## 2015-09-22 LAB — PROTIME-INR
INR: 1.16
PROTHROMBIN TIME: 14.8 s (ref 11.4–15.2)

## 2015-09-22 SURGERY — LEFT HEART CATH AND CORS/GRAFTS ANGIOGRAPHY

## 2015-09-22 MED ORDER — HEPARIN (PORCINE) IN NACL 2-0.9 UNIT/ML-% IJ SOLN
INTRAMUSCULAR | Status: DC | PRN
  Administered 2015-09-22: 1000 mL

## 2015-09-22 MED ORDER — SODIUM CHLORIDE 0.9 % IV SOLN: 250.0000 mL | INTRAVENOUS | Status: DC | PRN

## 2015-09-22 MED ORDER — HEPARIN (PORCINE) IN NACL 2-0.9 UNIT/ML-% IJ SOLN
INTRAMUSCULAR | Status: AC
  Filled 2015-09-22: qty 1000

## 2015-09-22 MED ORDER — VERAPAMIL HCL 2.5 MG/ML IV SOLN
INTRAVENOUS | Status: DC | PRN
  Administered 2015-09-22: 10 mL via INTRA_ARTERIAL

## 2015-09-22 MED ORDER — MIDAZOLAM HCL 2 MG/2ML IJ SOLN
INTRAMUSCULAR | Status: AC
  Filled 2015-09-22: qty 2

## 2015-09-22 MED ORDER — SODIUM CHLORIDE 0.9% FLUSH: 3.0000 mL | Freq: Two times a day (BID) | INTRAVENOUS | Status: DC

## 2015-09-22 MED ORDER — FENTANYL CITRATE (PF) 100 MCG/2ML IJ SOLN
INTRAMUSCULAR | Status: DC | PRN
  Administered 2015-09-22: 25 ug via INTRAVENOUS

## 2015-09-22 MED ORDER — LIDOCAINE HCL (PF) 1 % IJ SOLN
INTRAMUSCULAR | Status: DC | PRN
  Administered 2015-09-22: 2 mL via INTRADERMAL

## 2015-09-22 MED ORDER — MIDAZOLAM HCL 2 MG/2ML IJ SOLN
INTRAMUSCULAR | Status: DC | PRN
  Administered 2015-09-22: 2 mg via INTRAVENOUS

## 2015-09-22 MED ORDER — IOPAMIDOL (ISOVUE-370) INJECTION 76%
INTRAVENOUS | Status: DC | PRN
  Administered 2015-09-22: 155 mL via INTRAVENOUS

## 2015-09-22 MED ORDER — IOPAMIDOL (ISOVUE-370) INJECTION 76%
INTRAVENOUS | Status: AC
  Filled 2015-09-22: qty 125

## 2015-09-22 MED ORDER — ASPIRIN 81 MG PO CHEW
CHEWABLE_TABLET | ORAL | Status: AC
  Administered 2015-09-22: 81 mg via ORAL
  Filled 2015-09-22: qty 1

## 2015-09-22 MED ORDER — ASPIRIN 81 MG PO CHEW
81.0000 mg | CHEWABLE_TABLET | ORAL | Status: AC
  Administered 2015-09-22: 81 mg via ORAL

## 2015-09-22 MED ORDER — SODIUM CHLORIDE 0.9% FLUSH: 3.0000 mL | INTRAVENOUS | Status: DC | PRN

## 2015-09-22 MED ORDER — VERAPAMIL HCL 2.5 MG/ML IV SOLN
INTRAVENOUS | Status: AC
  Filled 2015-09-22: qty 2

## 2015-09-22 MED ORDER — LIDOCAINE HCL (PF) 1 % IJ SOLN
INTRAMUSCULAR | Status: AC
  Filled 2015-09-22: qty 30

## 2015-09-22 MED ORDER — HEPARIN SODIUM (PORCINE) 1000 UNIT/ML IJ SOLN
INTRAMUSCULAR | Status: DC | PRN
  Administered 2015-09-22: 4500 [IU] via INTRAVENOUS

## 2015-09-22 MED ORDER — SODIUM CHLORIDE 0.9 % IV SOLN: INTRAVENOUS | Status: AC

## 2015-09-22 MED ORDER — SODIUM CHLORIDE 0.9 % IV SOLN
INTRAVENOUS | Status: AC
  Administered 2015-09-22: 08:00:00 via INTRAVENOUS

## 2015-09-22 MED ORDER — HEPARIN SODIUM (PORCINE) 1000 UNIT/ML IJ SOLN
INTRAMUSCULAR | Status: AC
  Filled 2015-09-22: qty 1

## 2015-09-22 MED ORDER — FENTANYL CITRATE (PF) 100 MCG/2ML IJ SOLN
INTRAMUSCULAR | Status: AC
  Filled 2015-09-22: qty 2

## 2015-09-22 SURGICAL SUPPLY — 11 items
CATH INFINITI 5 FR IM (CATHETERS) ×2 IMPLANT
CATH INFINITI 5 FR LCB (CATHETERS) ×2 IMPLANT
CATH INFINITI 5 FR RCB (CATHETERS) ×2 IMPLANT
CATH INFINITI 5FR MULTPACK ANG (CATHETERS) ×2 IMPLANT
GLIDESHEATH SLEND SS 6F .021 (SHEATH) ×2 IMPLANT
KIT HEART LEFT (KITS) ×3 IMPLANT
PACK CARDIAC CATHETERIZATION (CUSTOM PROCEDURE TRAY) ×3 IMPLANT
SYR MEDRAD MARK V 150ML (SYRINGE) ×3 IMPLANT
TRANSDUCER W/STOPCOCK (MISCELLANEOUS) ×3 IMPLANT
TUBING CIL FLEX 10 FLL-RA (TUBING) ×3 IMPLANT
WIRE SAFE-T 1.5MM-J .035X260CM (WIRE) ×2 IMPLANT

## 2015-09-22 NOTE — Discharge Instructions (Signed)
Radial Site Care °Refer to this sheet in the next few weeks. These instructions provide you with information about caring for yourself after your procedure. Your health care provider may also give you more specific instructions. Your treatment has been planned according to current medical practices, but problems sometimes occur. Call your health care provider if you have any problems or questions after your procedure. °WHAT TO EXPECT AFTER THE PROCEDURE °After your procedure, it is typical to have the following: °· Bruising at the radial site that usually fades within 1-2 weeks. °· Blood collecting in the tissue (hematoma) that may be painful to the touch. It should usually decrease in size and tenderness within 1-2 weeks. °HOME CARE INSTRUCTIONS °· Take medicines only as directed by your health care provider. °· You may shower 24-48 hours after the procedure or as directed by your health care provider. Remove the bandage (dressing) and gently wash the site with plain soap and water. Pat the area dry with a clean towel. Do not rub the site, because this may cause bleeding. °· Do not take baths, swim, or use a hot tub until your health care provider approves. °· Check your insertion site every day for redness, swelling, or drainage. °· Do not apply powder or lotion to the site. °· Do not flex or bend the affected arm for 24 hours or as directed by your health care provider. °· Do not push or pull heavy objects with the affected arm for 24 hours or as directed by your health care provider. °· Do not lift over 10 lb (4.5 kg) for 5 days after your procedure or as directed by your health care provider. °· Ask your health care provider when it is okay to: °¨ Return to work or school. °¨ Resume usual physical activities or sports. °¨ Resume sexual activity. °· Do not drive home if you are discharged the same day as the procedure. Have someone else drive you. °· You may drive 24 hours after the procedure unless otherwise  instructed by your health care provider. °· Do not operate machinery or power tools for 24 hours after the procedure. °· If your procedure was done as an outpatient procedure, which means that you went home the same day as your procedure, a responsible adult should be with you for the first 24 hours after you arrive home. °· Keep all follow-up visits as directed by your health care provider. This is important. °SEEK MEDICAL CARE IF: °· You have a fever. °· You have chills. °· You have increased bleeding from the radial site. Hold pressure on the site. °SEEK IMMEDIATE MEDICAL CARE IF: °· You have unusual pain at the radial site. °· You have redness, warmth, or swelling at the radial site. °· You have drainage (other than a small amount of blood on the dressing) from the radial site. °· The radial site is bleeding, and the bleeding does not stop after 30 minutes of holding steady pressure on the site. °· Your arm or hand becomes pale, cool, tingly, or numb. °  °This information is not intended to replace advice given to you by your health care provider. Make sure you discuss any questions you have with your health care provider. °  °Document Released: 02/18/2010 Document Revised: 02/06/2014 Document Reviewed: 08/04/2013 °Elsevier Interactive Patient Education ©2016 [employer] ° °

## 2015-09-22 NOTE — Interval H&P Note (Signed)
History and Physical Interval Note:  09/22/2015 10:29 AM  Dylan Roth  has presented today for cardiac cath with the diagnosis of unstable angina  The various methods of treatment have been discussed with the patient and family. After consideration of risks, benefits and other options for treatment, the patient has consented to  Procedure(s): Left Heart Cath and Cors/Grafts Angiography (N/A) as a surgical intervention .  The patient's history has been reviewed, patient examined, no change in status, stable for surgery.  I have reviewed the patient's chart and labs.  Questions were answered to the patient's satisfaction.    Cath Lab Visit (complete for each Cath Lab visit)  Clinical Evaluation Leading to the Procedure:   ACS: No.  Non-ACS:    Anginal Classification: CCS III  Anti-ischemic medical therapy: Maximal Therapy (2 or more classes of medications)  Non-Invasive Test Results: No non-invasive testing performed  Prior CABG: Previous CABG         Lauree Chandler

## 2015-09-22 NOTE — H&P (View-Only) (Signed)
Chief Complaint  Patient presents with  . Atrial Fibrillation  . Atrial Flutter  . Chest Pain     History of Present Illness: 75 yo WM with history of CAD s/p 5V CABG 2005, atrial fibrillation/flutter, HTN, HLD, sinus node dysfunction s/p pacemaker implantation who is here today for cardiac follow up. Atrial flutter diagnosed in 2012. He has been followed by Dr. Caryl Comes. He saw Dr. Caryl Comes in April 2013 and was started on dofetilide for atrial fibrillation. He had post-termination pauses and had a pacemaker implanted in April 2013. He has since had Tikosyn stopped due to cost and was started on sotalol. He was switched from Xarelto to coumadin due to cost. Echo 10/29/12 with LVEF=55-60%, mild LVH, no significant valve disease. I saw him in February 2015 and he had chest pain c/w musculoskeletal etiology, clearly worse with movement. He was seen by Dr. Caryl Comes 07/31/13 and c/o lightheadedness. Coreg lowered to 6.25 mg po BID. Also c/o chest discomfort. Stress myoview 09/01/13 with no ischemia. He was seen in our office May 2016 by Richardson Dopp, PA-C with c/o chest pain. Stress myoview was ordered but was cancelled by patient due to cost. I saw him in January 2017 and he c/o daily sharp chest pains. Imdur was added.   He is here today for follow up. He continues to have chest pains several days per week. He notices this when he is anxious or stressed or when exerting himself. No SOB. No palpitations, dizziness, near syncope or syncope.   Primary Care Physician: [name] (Inactive)   Past Medical History:  Diagnosis Date  . Angina   . Arthritis    "in my back"  . Atrial fibrillation/flutter   . BPH (benign prostatic hyperplasia)   . CAD (coronary artery disease) Dec 2005   Hx MI. 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 Central obesity   . Chronic back pain   . Gout    "once; I have it under control w/medicine"  . Hyperlipidemia   .  Hypertension   . Pacemaker   . Sinus node dysfunction (HCC)    post termination pauses <5sec    Past Surgical History:  Procedure Laterality Date  . CARDIAC CATHETERIZATION  2005   "that what sent me to CABG"  . CATARACT EXTRACTION W/ INTRAOCULAR LENS  IMPLANT, BILATERAL  ~ 01/2011  . CHOLECYSTECTOMY  1978   . CORONARY ARTERY BYPASS GRAFT  2005   in [place] - LIMA to LAD, SVG to diagonal, SVG to ramus, SVG to OM and SVG to RCA   . PACEMAKER INSERTION  2014  . PERMANENT PACEMAKER INSERTION N/A 05/25/2011   Procedure: PERMANENT PACEMAKER INSERTION;  Surgeon: [name], MD;  Location: [facility] CATH LAB;  Service: Cardiovascular;  Laterality: N/A;  . TRANSURETHRAL RESECTION OF PROSTATE N/A 12/29/2013   Procedure: TRANSURETHRAL RESECTION OF THE PROSTATE WITH GYRUS INSTRUMENTS;  Surgeon: [name], MD;  Location: [facility] ORS;  Service: Urology;  Laterality: N/A;    Current Outpatient Prescriptions  Medication Sig Dispense Refill  . allopurinol (ZYLOPRIM) 300 MG tablet Take 300 mg by mouth every morning.     Marland Kitchen atorvastatin (LIPITOR) 40 MG tablet Take 1 tablet (40 mg total) by mouth every evening. 90 tablet 1  . carvedilol (COREG) 3.125 MG tablet Take 3.125 mg by mouth 2 (two) times daily with a meal.    . Cholecalciferol (VITAMIN D PO) Take 5,000  Units by mouth daily.    . isosorbide mononitrate (IMDUR) 30 MG 24 hr tablet Take 1 tablet (30 mg total) by mouth daily. 90 tablet 3  . lisinopril (PRINIVIL,ZESTRIL) 20 MG tablet Take 20 mg by mouth at bedtime.     Marland Kitchen NITROSTAT 0.4 MG SL tablet DISSOLVE ONE TABLET UNDER THE TONGUE EVERY 5 MINUTES AS NEEDED FOR CHEST PAIN. DO NOT EXCEED A TOTAL OF 3 DOSES IN 15 MINUTES 25 tablet 2  . sotalol (BETAPACE) 120 MG tablet Take 120 mg by mouth 2 (two) times daily.    Marland Kitchen warfarin (COUMADIN) 5 MG tablet TAKE 1 TABLET (5 MG) AS DIRECTED OR AS DIRECTED BY THE COUMADIN CLINIC 90 tablet 3   No current facility-administered medications for this visit.      Allergies  Allergen Reactions  . Sulfa Antibiotics Other (See Comments)    SWELLING ON PATIENTS LIPS, HE GETS DRIED UP    Social History   Social History  . Marital status: Married    Spouse name: N/A  . Number of children: 2  . Years of education: N/A   Occupational History  . retired-truck driver   .  Retired   Social History Main Topics  . Smoking status: Former Smoker    Packs/day: 0.00    Years: 0.00    Types: Cigarettes    Quit date: 01/30/1961  . Smokeless tobacco: Never Used  . Alcohol use No     Comment: 2 drinks (bourbon) a night  . Drug use: No  . Sexual activity: Yes   Other Topics Concern  . Not on file   Social History Narrative   Married. Pt works part-time for a [employer]. Quit tobacco 50 years ago..Both parents are deceased, neither one had cardiac issues and no siblings have cardiac issues.     Family History  Problem Relation Age of Onset  . Cancer Mother   . Stroke Brother   . Heart attack Neg Hx     Review of Systems:  As stated in the HPI and otherwise negative.   BP 110/62   Pulse 68   Ht 5\' 6"  (1.676 m)   Wt 197 lb 9.6 oz (89.6 kg)   SpO2 98%   BMI 31.89 kg/m   Physical Examination: General: Well developed, well nourished, NAD  HEENT: OP clear, mucus membranes moist  SKIN: warm, dry. No rashes. Neuro: No focal deficits  Musculoskeletal: Muscle strength 5/5 all ext  Psychiatric: Mood and affect normal  Neck: No JVD, no carotid bruits, no thyromegaly, no lymphadenopathy.  Lungs:Clear bilaterally, no wheezes, rhonci, crackles Cardiovascular: Regular rate and rhythm. No murmurs, gallops or rubs. Abdomen:Soft. Bowel sounds present. Non-tender.  Extremities: No lower extremity edema. Pulses are 2 + in the bilateral DP/PT.  Echo 09/3012: Left ventricle: The cavity size was normal. Wall thickness was increased in a pattern of mild LVH. Systolic function was normal. The estimated ejection fraction was in the range of 55% to  60%. Wall motion was normal; there were no regional wall motion abnormalities. Doppler parameters are consistent with abnormal left ventricular relaxation (grade 1 diastolic dysfunction).  Stress myoview 09/01/13: Stress Procedure: The patient received IV adenosine at 140 mcg/kg/min for 4 minutes. Technetium 57m Sestamibi was injected at the 2 minute mark and quantitative spect images were obtained after a 45 minute delay.  Stress ECG: intermittent v-pacing was noted  QPS  Raw Data Images: Mild diaphragmatic attenuation. Normal left ventricular size.  Stress Images: There is decreased  uptake in the inferior wall.  Rest Images: There is decreased uptake in the inferior wall.  Subtraction (SDS): No evidence of ischemia.  Transient Ischemic Dilatation (Normal <1.22): 1.08  Lung/Heart Ratio (Normal <0.45): 0.32  Quantitative Gated Spect Images  QGS EDV: 82 ml  QGS ESV: 38 ml  Impression  Exercise Capacity: Adenosine study with no exercise.  BP Response: Hypotensive blood pressure response.  Clinical Symptoms: No significant symptoms noted.  ECG Impression: Non-diagnostic, intermittent v-pacing  Comparison with Prior Nuclear Study: No significant change from previous study  Overall Impression: Low risk stress nuclear study with inferior bowel attenuation artifact. No ischemia.  LV Ejection Fraction: 53%. LV Wall Motion: NL LV Function; NL Wall Motion  EKG:  EKG is not ordered today. The ekg ordered today demonstrates   Recent Labs: 08/10/2015: Magnesium 1.9   Lipid Panel Followed in primary care   Wt Readings from Last 3 Encounters:  09/16/15 197 lb 9.6 oz (89.6 kg)  08/10/15 196 lb 6.4 oz (89.1 kg)  02/23/15 198 lb 12.8 oz (90.2 kg)     Other studies Reviewed: Additional studies/ records that were reviewed today include: . Review of the above records demonstrates:   Assessment and Plan:   1. CAD with unstable angina: He continues to have chest pain with exertion and at rest.  Minimal improvement with Imdur. He is on Coreg. His chest pain is worsening. This may represent unstable angina. Will plan cardiac cath at [facility] next week. Continue current therapy but hold coumadin starting today for cath. Pre-cath labs next week on Tuesday. Risks and benefits of cath reviewed   2. Atrial fibrillation, paroxysmal: He is on sotalol and coumadin per Dr. Caryl Comes. Pacemaker in place.   3. HTN: BP controlled. No changes today.   Current medicines are reviewed at length with the patient today.  The patient does not have concerns regarding medicines.  The following changes have been made:  no change  Labs/ tests ordered today include:   Orders Placed This Encounter  Procedures  . Basic Metabolic Panel (BMET)  . CBC w/Diff  . INR/PT    Disposition:   FU with me in 6 months  Signed, Lauree Chandler, MD 09/16/2015 1:23 PM    [facility] [address], [place], [place]  [address] Phone: [phone]; Fax: [phone]

## 2015-09-23 ENCOUNTER — Other Ambulatory Visit: Payer: Self-pay | Admitting: *Deleted

## 2015-09-23 ENCOUNTER — Telehealth: Payer: Self-pay | Admitting: *Deleted

## 2015-09-23 ENCOUNTER — Other Ambulatory Visit: Payer: Self-pay | Admitting: Cardiovascular Disease

## 2015-09-23 ENCOUNTER — Encounter (HOSPITAL_COMMUNITY): Payer: Self-pay | Admitting: Cardiovascular Disease

## 2015-09-23 DIAGNOSIS — Z0189 Encounter for other specified special examinations: Secondary | ICD-10-CM

## 2015-09-23 DIAGNOSIS — N138 Other obstructive and reflux uropathy: Secondary | ICD-10-CM

## 2015-09-23 DIAGNOSIS — N401 Enlarged prostate with lower urinary tract symptoms: Secondary | ICD-10-CM

## 2015-09-23 DIAGNOSIS — I25110 Atherosclerotic heart disease of native coronary artery with unstable angina pectoris: Secondary | ICD-10-CM

## 2015-09-23 NOTE — Telephone Encounter (Signed)
Received message from Dr. Angelena Form that pt needs Coronary CT angiogram to define his grafts and assess aortic root.  This should be done in 2-3 weeks.  Will need BMP prior to this (recently had cath on 09/22/15) I placed call to pt and left message to call back

## 2015-09-24 DIAGNOSIS — Z0189 Encounter for other specified special examinations: Secondary | ICD-10-CM | POA: Insufficient documentation

## 2015-09-24 NOTE — Telephone Encounter (Signed)
Notified the pt that per Dr Angelena Form, he would like for the pt to be scheduled to have a coronary ct angiogram to be done, to define his grafts and assess his aortic root.  Informed the pt that per Dr Angelena Form, this will need to be done in  2-3 weeks. Informed the pt that he will need to come into the office to have a BMET drawn, a couple days prior to his coronary ct, for he recently just had a cath on 8/23.  Informed the pt that I will place both the coronary cta and a bmet in the system, and our [facility] scheduler will be calling him in the near future to have both test scheduled.  Informed the pt that the coronary ct will be done at [facility].  Pt verbalized understanding and agrees with this plan.  Will route this message to Dr Camillia Herter RN as an Juluis Rainier and to follow-up on the scheduling piece.  Pt verbalized understanding, agrees with this plan, and gracious for all the assistance provided.

## 2015-09-24 NOTE — Telephone Encounter (Signed)
Follow Up: ° ° ° ° ° ° °Returning Pat's call from yesterday. °

## 2015-09-24 NOTE — Telephone Encounter (Signed)
Thanks for doing this. Have a good weekend.   Gerald Stabs

## 2015-09-27 ENCOUNTER — Encounter: Payer: Self-pay | Admitting: Cardiology

## 2015-09-30 ENCOUNTER — Telehealth: Payer: Self-pay | Admitting: Cardiovascular Disease

## 2015-09-30 NOTE — Telephone Encounter (Signed)
New Message  Pt returning call to RN. Please call back to discuss

## 2015-09-30 NOTE — Telephone Encounter (Signed)
If he cannot get through this, we will have to cancel the procedure. Please let him know that this test was to give [place] more information about his grafts and coronary anatomy but if he cannot complete the test, we can continue with medical management. cdm

## 2015-09-30 NOTE — Telephone Encounter (Signed)
Spoke with pt and gave him information from Dr. Angelena Form.  He wants to think more about CT and does not want to schedule at this time.  He will discuss with Ellen Henri, PA at visit on 10/12/15

## 2015-09-30 NOTE — Telephone Encounter (Signed)
See previous phone note.  

## 2015-09-30 NOTE — Telephone Encounter (Signed)
Spoke with pt. He reports being severely claustrophobic and is very concerned about CT.  I talked with him about the possibility of taking Valium prior to procedure but he said this doesn't help.  States stress myoview caused great anxiety.

## 2015-10-12 ENCOUNTER — Ambulatory Visit (INDEPENDENT_AMBULATORY_CARE_PROVIDER_SITE_OTHER): Payer: Medicare HMO | Admitting: Pharmacist

## 2015-10-12 ENCOUNTER — Ambulatory Visit (INDEPENDENT_AMBULATORY_CARE_PROVIDER_SITE_OTHER): Payer: Medicare HMO | Admitting: Cardiology

## 2015-10-12 ENCOUNTER — Encounter: Payer: Self-pay | Admitting: Cardiology

## 2015-10-12 DIAGNOSIS — Z7901 Long term (current) use of anticoagulants: Secondary | ICD-10-CM | POA: Diagnosis not present

## 2015-10-12 DIAGNOSIS — I25119 Atherosclerotic heart disease of native coronary artery with unspecified angina pectoris: Secondary | ICD-10-CM | POA: Diagnosis not present

## 2015-10-12 DIAGNOSIS — I4891 Unspecified atrial fibrillation: Secondary | ICD-10-CM

## 2015-10-12 LAB — POCT INR: INR: 2.2

## 2015-10-12 MED ORDER — ISOSORBIDE MONONITRATE ER 30 MG PO TB24: 30.0000 mg | ORAL_TABLET | Freq: Two times a day (BID) | ORAL | 3 refills | Status: DC

## 2015-10-12 NOTE — Patient Instructions (Addendum)
Your physician has recommended you make the following change in your medication: INCREASE  ISOSORBIDE  TO 30 MG  BID   Your physician recommends that you schedule a follow-up appointment in:  [place]

## 2015-10-12 NOTE — Progress Notes (Signed)
10/12/2015 Dylan Roth   05-Oct-1940  ID:[id number]  Primary Physician SPRY,HEATHER (Inactive) Primary Cardiologist: Dr. Buena Irish Electrophysiologist: Dr. Caryl Comes   Reason for Visit/CC: f/u for CAD/ chest pain   HPI:  75 yo WM with history of CAD s/p 5V CABG 2005, atrial fibrillation/flutter, HTN, HLD, sinus node dysfunction s/p pacemaker implantation who is here today for cardiac follow up. Atrial flutter diagnosed in 2012. He has been followed by Dr. Caryl Comes. He saw Dr. Caryl Comes in April 2013 and was started on dofetilide for atrial fibrillation. He had post-termination pauses and had a pacemaker implanted in April 2013. He has since had Tikosyn stopped due to cost and was started on sotalol. He was switched from Xarelto to coumadin due to cost. Echo 10/29/12 with LVEF=55-60%, mild LVH, no significant valve disease.   He was recently seen by Dr. Buena Irish in clinic on 09/16/2015 and complained of resting as well as exertional chest pain. Symptoms were felt to be consistent with unstable angina. Subsequently he underwent a left heart catheterization. This was performed on 09/22/2015. Angiographic details are as follows.   Prox RCA to Mid RCA lesion, 100 %stenosed.  Ost 2nd Diag lesion, 100 %stenosed.  SVG and is normal in caliber and anatomically normal.  LM lesion, 60 %stenosed.  Ost Cx lesion, 50 %stenosed.  Ost 1st Mrg lesion, 100 %stenosed.  SVG graft was visualized by angiography and is normal in caliber and anatomically normal.  Prox LAD lesion, 100 %stenosed.  LIMA graft was visualized by angiography and is normal in caliber and anatomically normal.  1st Diag lesion, 90 %stenosed.   1. Severe triple vessel CAD s/p ?5V CABG with 3/5 patent bypass grafts. [place]. The documentation in our record over the years states 5V CABG. No vein graft markers found today so I cannot exclude patency of 2 other vein grafts. Unable to identify vein grafts with  aortic root angiogram.  2. Occluded mid LAD with filling of the mid and distal vessel from the patent LIMA graft 3. Occluded intermediate and diagonal branches which fill from the patent vein grafts.  4. Cannot identify a graft to the Diagonal or the RCA 5. Moderate left main stenosis supplying the moderate caliber circumflex.  6. Dilatation of the aortic root.   There were no targets for PCI however Dr. Angelena Form was unable to visualize 2 additional vein grafts that were reported in his medical record. Subsequently he recommended that he undergo a coronary CTA to see if we could visualize additional bypass grafts and to also get a better estimate of the size of his aortic root. The patient was unable to undergo this study due to severe claustrophobia. Dr. Angelena Form was notified and offered Valium but also mention that if the patient could not tolerated this, it would be okay and we could continue with medical therapy.  He presents back to clinic today for follow-up.  He remains hesitant. He does not think that he could tolerate a cardiac CT. From a symptom perspective, he reports that his chest discomfort has improved. He continues to have occasional chest discomfort but mostly with moderate exertion activities. Symptoms quickly resolve with rest. He reports full medication compliance. His blood pressure is 130/80. Pulse rate 66.   Current Outpatient Prescriptions  Medication Sig Dispense Refill  . allopurinol (ZYLOPRIM) 300 MG tablet Take 300 mg by mouth every morning.     Marland Kitchen atorvastatin (LIPITOR) 40 MG tablet Take 1 tablet (40 mg total) by  mouth every evening. 90 tablet 1  . carvedilol (COREG) 3.125 MG tablet Take 1 tablet (3.125 mg total) by mouth 2 (two) times daily with a meal. 180 tablet 3  . Cholecalciferol (VITAMIN D PO) Take 5,000 Units by mouth daily.    . isosorbide mononitrate (IMDUR) 30 MG 24 hr tablet Take 1 tablet (30 mg total) by mouth daily. 90 tablet 3  . lisinopril  (PRINIVIL,ZESTRIL) 20 MG tablet Take 20 mg by mouth at bedtime.     Marland Kitchen NITROSTAT 0.4 MG SL tablet DISSOLVE ONE TABLET UNDER THE TONGUE EVERY 5 MINUTES AS NEEDED FOR CHEST PAIN. DO NOT EXCEED A TOTAL OF 3 DOSES IN 15 MINUTES 25 tablet 2  . Omega-3 Fatty Acids (FISH OIL PO) Take 1 capsule by mouth daily.    . sotalol (BETAPACE) 120 MG tablet Take 1 tablet (120 mg total) by mouth every 12 (twelve) hours. 180 tablet 3  . warfarin (COUMADIN) 5 MG tablet TAKE 1 TABLET (5 MG) AS DIRECTED OR AS DIRECTED BY THE COUMADIN CLINIC 90 tablet 3   No current facility-administered medications for this visit.     Allergies  Allergen Reactions  . Sulfa Antibiotics Other (See Comments)    SWELLING ON PATIENTS LIPS, HE GETS DRIED UP    Social History   Social History  . Marital status: Married    Spouse name: N/A  . Number of children: 2  . Years of education: N/A   Occupational History  . retired-truck driver   .  Retired   Social History Main Topics  . Smoking status: Former Smoker    Packs/day: 0.00    Years: 0.00    Types: Cigarettes    Quit date: 01/30/1961  . Smokeless tobacco: Never Used  . Alcohol use No     Comment: 2 drinks (bourbon) a night  . Drug use: No  . Sexual activity: Yes   Other Topics Concern  . Not on file   Social History Narrative   Married. Pt works part-time for a [employer]. Quit tobacco 50 years ago..Both parents are deceased, neither one had cardiac issues and no siblings have cardiac issues.      Review of Systems: General: negative for chills, fever, night sweats or weight changes.  Cardiovascular: negative for chest pain, dyspnea on exertion, edema, orthopnea, palpitations, paroxysmal nocturnal dyspnea or shortness of breath Dermatological: negative for rash Respiratory: negative for cough or wheezing Urologic: negative for hematuria Abdominal: negative for nausea, vomiting, diarrhea, bright red blood per rectum, melena, or hematemesis Neurologic:  negative for visual changes, syncope, or dizziness All other systems reviewed and are otherwise negative except as noted above.    Pulse 66, height 5\' 6"  (1.676 m), weight 196 lb 12.8 oz (89.3 kg).  General appearance: alert, cooperative and no distress Neck: no carotid bruit and no JVD Lungs: clear to auscultation bilaterally Heart: regular rate and rhythm, S1, S2 normal, no murmur, click, rub or gallop Extremities: no LEE Pulses: 2+ and symmetric Skin: warm and dry Neurologic: Grossly normal  EKG not performed   ASSESSMENT AND PLAN:   1. CAD:  Recent LHC showed Severe triple vessel CAD s/p ?5V CABG with 3/5 patent bypass grafts. [place]. The documentation in our record over the years states 5V CABG. No vein graft markers found on cath. Patient unable to tolerate cardiac CT due to severe claustrophobia. From a symptom stanpdoint, his angina appears more stable. Symptoms occur in a predicable manor  with certain physical activities of moderate exertion and improve quickly with rest. His BP is 130/80 and pulse rate is 66 bpm. Continue Coreg at current dose given HR and increase Imdur from 30-60 mg daily. We will attempt to obtain medical records from [facility] in [place].   PLAN  F/u with Dr. Angelena Form in 3 months   Lyda Jester [facility] 10/12/2015 10:22 AM

## 2015-10-18 ENCOUNTER — Telehealth: Payer: Self-pay | Admitting: *Deleted

## 2015-10-18 NOTE — Telephone Encounter (Signed)
Received call from Dylan Roth, [place] at [facility]. 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  PA is asking if Dr. 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  If so she is asking if Dr. Angelena Form could write a letter indicating this.  Office fax number is [phone].  If there are questions cell number for Dylan Roth, [place] is [phone]

## 2015-10-19 ENCOUNTER — Encounter: Payer: Self-pay | Admitting: Cardiovascular Disease

## 2015-10-19 NOTE — Telephone Encounter (Signed)
Letter was faxed to Roselee Nova PA for pt  . Left a message for her  to call back if needed.

## 2015-10-19 NOTE — Telephone Encounter (Signed)
Can we fax my letter? Thanks, chris 

## 2015-11-23 ENCOUNTER — Ambulatory Visit (INDEPENDENT_AMBULATORY_CARE_PROVIDER_SITE_OTHER): Payer: Medicare HMO | Admitting: *Deleted

## 2015-11-23 DIAGNOSIS — Z7901 Long term (current) use of anticoagulants: Secondary | ICD-10-CM | POA: Diagnosis not present

## 2015-11-23 DIAGNOSIS — I4891 Unspecified atrial fibrillation: Secondary | ICD-10-CM

## 2015-11-23 LAB — POCT INR: INR: 2.9

## 2015-12-08 ENCOUNTER — Ambulatory Visit (INDEPENDENT_AMBULATORY_CARE_PROVIDER_SITE_OTHER): Payer: Medicare HMO | Admitting: *Deleted

## 2015-12-08 DIAGNOSIS — I495 Sick sinus syndrome: Secondary | ICD-10-CM

## 2015-12-09 ENCOUNTER — Encounter: Payer: Self-pay | Admitting: Cardiology

## 2015-12-09 NOTE — Progress Notes (Signed)
Remote pacemaker transmission.   

## 2016-01-04 ENCOUNTER — Ambulatory Visit (INDEPENDENT_AMBULATORY_CARE_PROVIDER_SITE_OTHER): Payer: Medicare HMO | Admitting: Pharmacist

## 2016-01-04 DIAGNOSIS — Z7901 Long term (current) use of anticoagulants: Secondary | ICD-10-CM | POA: Diagnosis not present

## 2016-01-04 DIAGNOSIS — I4891 Unspecified atrial fibrillation: Secondary | ICD-10-CM | POA: Diagnosis not present

## 2016-01-04 LAB — CUP PACEART REMOTE DEVICE CHECK
Battery Voltage: 2.93 V
Brady Statistic AP VP Percent: 63 %
Brady Statistic RA Percent Paced: 95 %
Date Time Interrogation Session: 20171108143456
Implantable Lead Implant Date: 20130425
Implantable Lead Location: 753859
Implantable Lead Location: 753860
Implantable Pulse Generator Implant Date: 20130425
Lead Channel Impedance Value: 430 Ohm
Lead Channel Pacing Threshold Amplitude: 0.5 V
Lead Channel Pacing Threshold Pulse Width: 0.4 ms
Lead Channel Sensing Intrinsic Amplitude: 7.5 mV
Lead Channel Setting Pacing Amplitude: 1.25 V
Lead Channel Setting Pacing Amplitude: 1.5 V
MDC IDC LEAD IMPLANT DT: 20130425
MDC IDC MSMT BATTERY REMAINING LONGEVITY: 105 mo
MDC IDC MSMT BATTERY REMAINING PERCENTAGE: 81 %
MDC IDC MSMT LEADCHNL RA IMPEDANCE VALUE: 480 Ohm
MDC IDC MSMT LEADCHNL RA SENSING INTR AMPL: 2.8 mV
MDC IDC MSMT LEADCHNL RV PACING THRESHOLD AMPLITUDE: 1 V
MDC IDC MSMT LEADCHNL RV PACING THRESHOLD PULSEWIDTH: 0.4 ms
MDC IDC SET LEADCHNL RV PACING PULSEWIDTH: 0.4 ms
MDC IDC SET LEADCHNL RV SENSING SENSITIVITY: 2 mV
MDC IDC STAT BRADY AP VS PERCENT: 33 %
MDC IDC STAT BRADY AS VP PERCENT: 1 %
MDC IDC STAT BRADY AS VS PERCENT: 3.7 %
MDC IDC STAT BRADY RV PERCENT PACED: 64 %
Pulse Gen Model: 2110
Pulse Gen Serial Number: 7322329

## 2016-01-04 LAB — POCT INR: INR: 3

## 2016-01-14 ENCOUNTER — Encounter: Payer: Self-pay | Admitting: Cardiovascular Disease

## 2016-01-14 ENCOUNTER — Ambulatory Visit (INDEPENDENT_AMBULATORY_CARE_PROVIDER_SITE_OTHER): Payer: Medicare HMO | Admitting: Cardiovascular Disease

## 2016-01-14 ENCOUNTER — Encounter (INDEPENDENT_AMBULATORY_CARE_PROVIDER_SITE_OTHER): Payer: Self-pay

## 2016-01-14 VITALS — BP 138/66 | HR 68 | Ht 66.0 in | Wt 198.8 lb

## 2016-01-14 DIAGNOSIS — I25118 Atherosclerotic heart disease of native coronary artery with other forms of angina pectoris: Secondary | ICD-10-CM

## 2016-01-14 DIAGNOSIS — I10 Essential (primary) hypertension: Secondary | ICD-10-CM

## 2016-01-14 DIAGNOSIS — I480 Paroxysmal atrial fibrillation: Secondary | ICD-10-CM

## 2016-01-14 MED ORDER — RANOLAZINE ER 500 MG PO TB12: 500.0000 mg | ORAL_TABLET | Freq: Two times a day (BID) | ORAL | 11 refills | Status: DC

## 2016-01-14 NOTE — Progress Notes (Signed)
Chief Complaint  Patient presents with  . Coronary Artery Disease     History of Present Illness: 75 yo WM with history of CAD s/p 5V CABG 2005, atrial fibrillation/flutter, HTN, HLD, sinus node dysfunction s/p pacemaker implantation who is here today for cardiac follow up. Atrial flutter diagnosed in 2012. He has been followed by Dr. Caryl Comes. He saw Dr. Caryl Comes in April 2013 and was started on dofetilide for atrial fibrillation. He had post-termination pauses and had a pacemaker implanted in April 2013. He has since had Tikosyn stopped due to cost and was started on sotalol. He was switched from Xarelto to coumadin due to cost. Echo 10/29/12 with LVEF=55-60%, mild LVH, no significant valve disease. I saw him in February 2015 and he had chest pain c/w musculoskeletal etiology, clearly worse with movement. He was seen by Dr. Caryl Comes 07/31/13 and c/o lightheadedness. Coreg lowered to 6.25 mg po BID. Also c/o chest discomfort. Stress myoview 09/01/13 with no ischemia. He was seen in our office May 2016 by Richardson Dopp, PA-C with c/o chest pain. Stress myoview was ordered but was cancelled by patient due to cost. I saw him in January 2017 and he c/o daily sharp chest pains. Imdur was added. I saw him in August 2017 and he continue to have chest pain several days per week. Cardiac cath 09/22/15 with 3 patent grafts but could not visualize the other two grafts. The RCA was occluded proximally with no graft to this vessel. We discussed a cardiac CTA but he refused due to claustrophobia.   He is here today for follow up. He continues to have chest pains several days per week with moderate exertion and during periods when he is anxious. No palpitations, dizziness, near syncope or syncope.   Primary Care Physician: [name] (Inactive)   Past Medical History:  Diagnosis Date  . Angina   . Arthritis    "in my back"  . Atrial fibrillation/flutter   . BPH (benign prostatic hyperplasia)   . CAD (coronary artery  disease) Dec 2005   Hx MI. 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 Central obesity   . Chronic back pain   . Gout    "once; I have it under control w/medicine"  . Hyperlipidemia   . Hypertension   . Pacemaker   . Sinus node dysfunction (HCC)    post termination pauses <5sec    Past Surgical History:  Procedure Laterality Date  . CARDIAC CATHETERIZATION  2005   "that what sent me to CABG"  . CARDIAC CATHETERIZATION N/A 09/22/2015   Procedure: Left Heart Cath and Cors/Grafts Angiography;  Surgeon: Burnell Blanks, MD;  Location: [facility];  Service: Cardiovascular;  Laterality: N/A;  . CATARACT EXTRACTION W/ INTRAOCULAR LENS  IMPLANT, BILATERAL  ~ 01/2011  . CHOLECYSTECTOMY  1978   . CORONARY ARTERY BYPASS GRAFT  2005   in [place] - LIMA to LAD, SVG to diagonal, SVG to ramus, SVG to OM and SVG to RCA   . PACEMAKER INSERTION  2014  . PERMANENT PACEMAKER INSERTION N/A 05/25/2011   Procedure: PERMANENT PACEMAKER INSERTION;  Surgeon: [name], MD;  Location: [facility] CATH LAB;  Service: Cardiovascular;  Laterality: N/A;  . TRANSURETHRAL RESECTION OF PROSTATE N/A 12/29/2013   Procedure: TRANSURETHRAL RESECTION OF THE PROSTATE WITH GYRUS INSTRUMENTS;  Surgeon: [name], MD;  Location: [facility];  Service: Urology;  Laterality: N/A;  Current Outpatient Prescriptions  Medication Sig Dispense Refill  . allopurinol (ZYLOPRIM) 300 MG tablet Take 300 mg by mouth every morning.     Marland Kitchen atorvastatin (LIPITOR) 40 MG tablet Take 1 tablet (40 mg total) by mouth every evening. 90 tablet 1  . carvedilol (COREG) 3.125 MG tablet Take 1 tablet (3.125 mg total) by mouth 2 (two) times daily with a meal. 180 tablet 3  . Cholecalciferol (VITAMIN D PO) Take 5,000 Units by mouth daily.    . isosorbide mononitrate (IMDUR) 30 MG 24 hr tablet Take 1 tablet (30 mg total) by mouth 2 (two) times daily. 180 tablet 3  . lisinopril  (PRINIVIL,ZESTRIL) 20 MG tablet Take 20 mg by mouth at bedtime.     Marland Kitchen loratadine (CLARITIN) 10 MG tablet Take 10 mg by mouth every other day.    Marland Kitchen NITROSTAT 0.4 MG SL tablet DISSOLVE ONE TABLET UNDER THE TONGUE EVERY 5 MINUTES AS NEEDED FOR CHEST PAIN. DO NOT EXCEED A TOTAL OF 3 DOSES IN 15 MINUTES 25 tablet 2  . Omega-3 Fatty Acids (FISH OIL PO) Take 1 capsule by mouth daily.    . sotalol (BETAPACE) 120 MG tablet Take 1 tablet (120 mg total) by mouth every 12 (twelve) hours. 180 tablet 3  . warfarin (COUMADIN) 5 MG tablet TAKE 1 TABLET (5 MG) AS DIRECTED OR AS DIRECTED BY THE COUMADIN CLINIC 90 tablet 3  . ranolazine (RANEXA) 500 MG 12 hr tablet Take 1 tablet (500 mg total) by mouth 2 (two) times daily. 60 tablet 11   No current facility-administered medications for this visit.     Allergies  Allergen Reactions  . Sulfa Antibiotics Other (See Comments)    SWELLING ON PATIENTS LIPS, HE GETS DRIED UP    Social History   Social History  . Marital status: Married    Spouse name: N/A  . Number of children: 2  . Years of education: N/A   Occupational History  . retired-truck driver   .  Retired   Social History Main Topics  . Smoking status: Former Smoker    Packs/day: 0.00    Years: 0.00    Types: Cigarettes    Quit date: 01/30/1961  . Smokeless tobacco: Never Used  . Alcohol use No     Comment: 2 drinks (bourbon) a night  . Drug use: No  . Sexual activity: Yes   Other Topics Concern  . Not on file   Social History Narrative   Married. Pt works part-time for a [employer]. Quit tobacco 50 years ago..Both parents are deceased, neither one had cardiac issues and no siblings have cardiac issues.     Family History  Problem Relation Age of Onset  . Cancer Mother   . Stroke Brother   . Heart attack Neg Hx     Review of Systems:  As stated in the HPI and otherwise negative.   BP 138/66   Pulse 68   Ht 5\' 6"  (1.676 m)   Wt 198 lb 12.8 oz (90.2 kg)   BMI 32.09 kg/m     Physical Examination: General: Well developed, well nourished, NAD  HEENT: OP clear, mucus membranes moist  SKIN: warm, dry. No rashes. Neuro: No focal deficits  Musculoskeletal: Muscle strength 5/5 all ext  Psychiatric: Mood and affect normal  Neck: No JVD, no carotid bruits, no thyromegaly, no lymphadenopathy.  Lungs:Clear bilaterally, no wheezes, rhonci, crackles Cardiovascular: Regular rate and rhythm. No murmurs, gallops or rubs. Abdomen:Soft. Bowel sounds present.  Non-tender.  Extremities: No lower extremity edema. Pulses are 2 + in the bilateral DP/PT.  Echo 09/3012: Left ventricle: The cavity size was normal. Wall thickness was increased in a pattern of mild LVH. Systolic function was normal. The estimated ejection fraction was in the range of 55% to 60%. Wall motion was normal; there were no regional wall motion abnormalities. Doppler parameters are consistent with abnormal left ventricular relaxation (grade 1 diastolic dysfunction).  Stress myoview 09/01/13: Stress Procedure: The patient received IV adenosine at 140 mcg/kg/min for 4 minutes. Technetium 33m Sestamibi was injected at the 2 minute mark and quantitative spect images were obtained after a 45 minute delay.  Stress ECG: intermittent v-pacing was noted  QPS  Raw Data Images: Mild diaphragmatic attenuation. Normal left ventricular size.  Stress Images: There is decreased uptake in the inferior wall.  Rest Images: There is decreased uptake in the inferior wall.  Subtraction (SDS): No evidence of ischemia.  Transient Ischemic Dilatation (Normal <1.22): 1.08  Lung/Heart Ratio (Normal <0.45): 0.32  Quantitative Gated Spect Images  QGS EDV: 82 ml  QGS ESV: 38 ml  Impression  Exercise Capacity: Adenosine study with no exercise.  BP Response: Hypotensive blood pressure response.  Clinical Symptoms: No significant symptoms noted.  ECG Impression: Non-diagnostic, intermittent v-pacing  Comparison with Prior Nuclear  Study: No significant change from previous study  Overall Impression: Low risk stress nuclear study with inferior bowel attenuation artifact. No ischemia.  LV Ejection Fraction: 53%. LV Wall Motion: NL LV Function; NL Wall Motion  EKG:  EKG is not ordered today. The ekg ordered today demonstrates   Recent Labs: 08/10/2015: Magnesium 1.9 09/21/2015: BUN 16; Creat 0.94; Hemoglobin 14.2; Platelets 189; Potassium 4.4; Sodium 139   Lipid Panel Followed in primary care   Wt Readings from Last 3 Encounters:  01/14/16 198 lb 12.8 oz (90.2 kg)  10/12/15 196 lb 12.8 oz (89.3 kg)  09/22/15 195 lb (88.5 kg)     Other studies Reviewed: Additional studies/ records that were reviewed today include: . Review of the above records demonstrates:   Assessment and Plan:   1. CAD with stable angina: He has stable angina on statin, beta blocker and Imdur. He is not on an ASA since he is also on coumadin. Cardiac cath August 2017 as described above with stable CAD but unable to identify 2 of the grafts. We discussed a coronary CTA to look for the other grafts and I showed him the CT scanner today. He is not sure he can tolerate due to claustrophobia. I will add Ranexa 500 mg po BID.   2. Atrial fibrillation, paroxysmal: He is on sotalol and coumadin per Dr. Caryl Comes. Pacemaker in place.   3. HTN: BP controlled. No changes today.   Current medicines are reviewed at length with the patient today.  The patient does not have concerns regarding medicines.  The following changes have been made:  no change  Labs/ tests ordered today include:   No orders of the defined types were placed in this encounter.   Disposition:   FU with me in 6 months  Signed, Lauree Chandler, MD 01/14/2016 12:45 PM    [address], [place], [place]  [address] Phone: [phone]; Fax: [phone]

## 2016-01-14 NOTE — Patient Instructions (Signed)
Medication Instructions:  Your physician has recommended you make the following change in your medication:  Start Ranexa 500 mg by mouth twice daily.    Labwork: none  Testing/Procedures: none  Follow-Up: Your physician recommends that you schedule a follow-up appointment in: 6 months. Please call us in about 3 months to schedule this appointment.     Any Other Special Instructions Will Be Listed Below (If Applicable).     If you need a refill on your cardiac medications before your next appointment, please call your pharmacy.

## 2016-01-20 ENCOUNTER — Encounter: Payer: Self-pay | Admitting: Physician Assistant

## 2016-02-07 ENCOUNTER — Encounter: Payer: Medicare HMO | Admitting: Physician Assistant

## 2016-02-07 NOTE — Progress Notes (Signed)
Cardiology Office Note Date:  02/08/2016  Patient ID:  Dylan Roth, DOB 01-11-1941, MRN  PCP:  [name] (Inactive)  Cardiologist:  Dr. Angelena Form Electrophysiologist: Dr. Caryl Comes   Chief Complaint: routine EP/device visit  History of Present Illness: Dylan Roth is a 76 y.o. male with history of CAD (CABG 2005), PFib, post termination pauses/sinus node dysfunction w/PPM, HTN, HLD, and Obesity He last saw Dr. Angelena Form 01/14/16 who discussed he had cardiac cath 09/22/15 with 3 patent grafts but could not visualize the other two grafts. The RCA was occluded proximally with no graft to this vessel. We discussed a cardiac CTA but he refused due to claustrophobia, he was having ongoing but stable c/o daily CP and Ranexa was added to his regime,  Thoughts on CTA were to try and identify the other to SVGs.  He comes in today to be seen for Dr. Caryl Comes for pacer evaluation, last seen by him in July noting excellent HR histograms, doing well from device AF standpoint.  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  He has very reproducible chest wall tenderness at his sternum and describes an aching type discomfort when he wakes that self resolves.  He thinks the Ranexa has helped him significantly though is expensive.  He denies any near syncope or syncope, describes mild orthostatic lightheadedness on occasion, no palpitations or SOB.  Denies any bleeding or signs of bleeding.  Device Information SJM dual chamber PPM implanted 05/25/11, Dr. Lovena Le, SSSx  AFib Hx Dx 2012, ?flutter Tikosyn stopped 2/2 cost >> Sotalol Xarelto stopped 2/2 cost ? bleeding>> coumadin    Past Medical History:  Diagnosis Date  . Angina   . Arthritis    "in my back"  . Atrial fibrillation/flutter   . BPH (benign prostatic hyperplasia)   . CAD  (coronary artery disease) Dec 2005   Hx MI. 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 Central obesity   . Chronic back pain   . Gout    "once; I have it under control w/medicine"  . Hyperlipidemia   . Hypertension   . Pacemaker   . Sinus node dysfunction (HCC)    post termination pauses <5sec    Past Surgical History:  Procedure Laterality Date  . CARDIAC CATHETERIZATION  2005   "that what sent me to CABG"  . CARDIAC CATHETERIZATION N/A 09/22/2015   Procedure: Left Heart Cath and Cors/Grafts Angiography;  Surgeon: Burnell Blanks, MD;  Location: [place] [facility] [facility];  Service: Cardiovascular;  Laterality: N/A;  . CATARACT EXTRACTION W/ INTRAOCULAR LENS  IMPLANT, BILATERAL  ~ 01/2011  . CHOLECYSTECTOMY  1978   . CORONARY ARTERY BYPASS GRAFT  2005   in [place] - LIMA to LAD, SVG to diagonal, SVG to ramus, SVG to OM and SVG to RCA   . PACEMAKER INSERTION  2014  . PERMANENT PACEMAKER INSERTION N/A 05/25/2011   Procedure: PERMANENT PACEMAKER INSERTION;  Surgeon: [name], MD;  Location: [facility] CATH LAB;  Service: Cardiovascular;  Laterality: N/A;  . TRANSURETHRAL RESECTION OF PROSTATE N/A 12/29/2013   Procedure: TRANSURETHRAL RESECTION OF THE PROSTATE WITH GYRUS INSTRUMENTS;  Surgeon: [name], MD;  Location: [facility];  Service: Urology;  Laterality: N/A;    Current Outpatient Prescriptions  Medication  Sig Dispense Refill  . allopurinol (ZYLOPRIM) 300 MG tablet Take 300 mg by mouth every morning.     Marland Kitchen atorvastatin (LIPITOR) 40 MG tablet Take 1 tablet (40 mg total) by mouth every evening. 90 tablet 1  . carvedilol (COREG) 3.125 MG tablet Take 1 tablet (3.125 mg total) by mouth 2 (two) times daily with a meal. 180 tablet 3  . Cholecalciferol (VITAMIN D PO) Take 5,000 Units by mouth daily.    . isosorbide mononitrate (IMDUR) 30 MG 24 hr tablet Take 1 tablet (30 mg total) by mouth 2 (two) times daily. 180 tablet 3  .  lisinopril (PRINIVIL,ZESTRIL) 20 MG tablet Take 20 mg by mouth at bedtime.     Marland Kitchen loratadine (CLARITIN) 10 MG tablet Take 10 mg by mouth every other day.    Marland Kitchen NITROSTAT 0.4 MG SL tablet DISSOLVE ONE TABLET UNDER THE TONGUE EVERY 5 MINUTES AS NEEDED FOR CHEST PAIN. DO NOT EXCEED A TOTAL OF 3 DOSES IN 15 MINUTES 25 tablet 2  . Omega-3 Fatty Acids (FISH OIL PO) Take 1 capsule by mouth daily.    . ranolazine (RANEXA) 500 MG 12 hr tablet Take 1 tablet (500 mg total) by mouth 2 (two) times daily. 60 tablet 11  . sotalol (BETAPACE) 120 MG tablet Take 1 tablet (120 mg total) by mouth every 12 (twelve) hours. 180 tablet 3  . warfarin (COUMADIN) 5 MG tablet TAKE 1 TABLET (5 MG) AS DIRECTED OR AS DIRECTED BY THE COUMADIN CLINIC 90 tablet 3   No current facility-administered medications for this visit.     Allergies:   Sulfa antibiotics   Social History:  The patient  reports that he quit smoking about 55 years ago. His smoking use included Cigarettes. He smoked 0.00 packs per day for 0.00 years. He has never used smokeless tobacco. He reports that he does not drink alcohol or use drugs.   Family History:  The patient's family history includes Cancer in his mother; Stroke in his brother.  ROS:  Please see the history of present illness.    All other systems are reviewed and otherwise negative.   PHYSICAL EXAM:  VS:  Pulse 60   Ht 5\' 6"  (1.676 m)   Wt 200 lb (90.7 kg)   BMI 32.28 kg/m  BMI: Body mass index is 32.28 kg/m. Well nourished, well developed, in no acute distress  HEENT: normocephalic, atraumatic  Neck: no JVD, carotid bruits or masses Cardiac:  RRR no significant murmurs, no rubs, or gallops Lungs:  clear to auscultation bilaterally, no wheezing, rhonchi or rales  Abd: soft, nontender MS: no deformity or atrophy Ext: no edema  Skin: warm and dry, no rash Neuro:  No gross deficits appreciated Psych: euthymic mood, full affect  PPM site is stable, no tethering or  discomfort   EKG:  Done today and reviewed by myself A paced, 60bpm, PR 265ms, QRS 67ms, QTc 448ms PPM interrogation done today and reviewed by myself notes stable battery and lead status, AMS <1% 10/29/12: TTE Study Conclusions Left ventricle: The cavity size was normal. Wall thickness was increased in a pattern of mild LVH. Systolic function was normal. The estimated ejection fraction was in the range of 55% to 60%. Wall motion was normal; there were no regional wall motion abnormalities. Doppler parameters are consistent with abnormal left ventricular relaxation (grade 1 diastolic dysfunction).  09/22/15: LHC Conclusion    Prox RCA to Mid RCA lesion, 100 %stenosed.  Ost 2nd Diag lesion, 100 %stenosed.  SVG and is normal in caliber and anatomically normal.  LM lesion, 60 %stenosed.  Ost Cx lesion, 50 %stenosed.  Ost 1st Mrg lesion, 100 %stenosed.  SVG graft was visualized by angiography and is normal in caliber and anatomically normal.  Prox LAD lesion, 100 %stenosed.  LIMA graft was visualized by angiography and is normal in caliber and anatomically normal.  1st Diag lesion, 90 %stenosed.   1. Severe triple vessel CAD s/p ?5V CABG with 3/5 patent bypass grafts. [place]. The documentation in our record over the years states 5V CABG. No vein graft markers found today so I cannot exclude patency of 2 other vein grafts. Unable to identify vein grafts with aortic root angiogram.  2. Occluded mid LAD with filling of the mid and distal vessel from the patent LIMA graft 3. Occluded intermediate and diagonal branches which fill from the patent vein grafts.  4. Cannot identify a graft to the Diagonal or the RCA 5. Moderate left main stenosis supplying the moderate caliber circumflex.  6. Dilatation of the aortic root.   Recommendations: No targets for PCI but cannot visualize 2 additional vein grafts that are reported in his medical record. Will  arrange Coronary CTA to see if we can visualize additional bypass grafts and also to get a better estimate of the size of his aortic root.     Recent Labs: 08/10/2015: Magnesium 1.9 09/21/2015: BUN 16; Creat 0.94; Hemoglobin 14.2; Platelets 189; Potassium 4.4; Sodium 139  No results found for requested labs within last 8760 hours.   CrCl cannot be calculated (Patient's most recent lab result is older than the maximum 21 days allowed.).   Wt Readings from Last 3 Encounters:  02/08/16 200 lb (90.7 kg)  01/14/16 198 lb 12.8 oz (90.2 kg)  10/12/15 196 lb 12.8 oz (89.3 kg)     Other studies reviewed: Additional studies/records reviewed today include: summarized above  ASSESSMENT AND PLAN:  1. Sick sinus syndrome w/PPM     normal device function, no changes made today  2. Paroxysmal AFib     CHA2DS2Vasc is at least 4 on Warfarin      Sotalol QTc is stable  3. CAD     CP s/p cath and last visit with Dr. Angelena Form as noted above     Some of his CP is clearly musculoskeletal though he states another CP that he has had for years that the Ranexa seems to have helped      C/w Dr. Angelena Form  4. HTN     Stable  Recommend BMET for Sotalol/meds and H/H given a/c though he reports he had full annual labs with his PMD last week and will see her on the 122nd for results, he will ask them to forward to [place] as well.  Disposition: F/u with coumadin clinic today as scheduled, Q 36mo remote pacer chesks, Dr. Caryl Comes in 1 year, sooner if needed and Dr. Angelena Form as directed by him.  Current medicines are reviewed at length with the patient today.  The patient did not have any concerns regarding medicines.  Dylan Lasso, PA-C 02/08/2016 11:55 AM     [facility] [place] [address] [phone] (office)  [phone] (fax)

## 2016-02-08 ENCOUNTER — Ambulatory Visit (INDEPENDENT_AMBULATORY_CARE_PROVIDER_SITE_OTHER): Payer: Medicare HMO

## 2016-02-08 ENCOUNTER — Ambulatory Visit (INDEPENDENT_AMBULATORY_CARE_PROVIDER_SITE_OTHER): Payer: Medicare HMO | Admitting: Physician Assistant

## 2016-02-08 VITALS — HR 60 | Ht 66.0 in | Wt 200.0 lb

## 2016-02-08 DIAGNOSIS — Z7901 Long term (current) use of anticoagulants: Secondary | ICD-10-CM | POA: Diagnosis not present

## 2016-02-08 DIAGNOSIS — I4891 Unspecified atrial fibrillation: Secondary | ICD-10-CM

## 2016-02-08 DIAGNOSIS — Z950 Presence of cardiac pacemaker: Secondary | ICD-10-CM | POA: Diagnosis not present

## 2016-02-08 DIAGNOSIS — I10 Essential (primary) hypertension: Secondary | ICD-10-CM

## 2016-02-08 DIAGNOSIS — I208 Other forms of angina pectoris: Secondary | ICD-10-CM

## 2016-02-08 DIAGNOSIS — I480 Paroxysmal atrial fibrillation: Secondary | ICD-10-CM

## 2016-02-08 DIAGNOSIS — I495 Sick sinus syndrome: Secondary | ICD-10-CM

## 2016-02-08 LAB — POCT INR: INR: 4

## 2016-02-08 NOTE — Patient Instructions (Signed)
Medication Instructions:   Your physician recommends that you continue on your current medications as directed. Please refer to the Current Medication list given to you today.   If you need a refill on your cardiac medications before your next appointment, please call your pharmacy.  Labwork: NONE ORDERED  TODAY    Testing/Procedures: NONE ORDERED  TODAY    Follow-Up: Your physician wants you to follow-up in: [place].Marland Kitchen  You will receive a reminder letter in the mail two months in advance. If you don't receive a letter, please call our office to schedule the follow-up appointment.   Remote monitoring is used to monitor your Pacemaker of ICD from home. This monitoring reduces the number of office visits required to check your device to one time per year. It allows [place] to keep an eye on the functioning of your device to ensure it is working properly. You are scheduled for a device check from home on.Marland KitchenMarland Kitchen4/10/2016..You may send your transmission at any time that day. If you have a wireless device, the transmission will be sent automatically. After your physician reviews your transmission, you will receive a postcard with your next transmission date.    Any Other Special Instructions Will Be Listed Below (If Applicable).

## 2016-02-16 ENCOUNTER — Other Ambulatory Visit: Payer: Self-pay | Admitting: Cardiovascular Disease

## 2016-02-29 ENCOUNTER — Ambulatory Visit (INDEPENDENT_AMBULATORY_CARE_PROVIDER_SITE_OTHER): Payer: Medicare HMO | Admitting: *Deleted

## 2016-02-29 DIAGNOSIS — Z7901 Long term (current) use of anticoagulants: Secondary | ICD-10-CM

## 2016-02-29 DIAGNOSIS — I4891 Unspecified atrial fibrillation: Secondary | ICD-10-CM

## 2016-02-29 LAB — POCT INR: INR: 2.8

## 2016-03-28 ENCOUNTER — Ambulatory Visit (INDEPENDENT_AMBULATORY_CARE_PROVIDER_SITE_OTHER): Payer: Medicare HMO | Admitting: *Deleted

## 2016-03-28 DIAGNOSIS — I4891 Unspecified atrial fibrillation: Secondary | ICD-10-CM

## 2016-03-28 DIAGNOSIS — Z7901 Long term (current) use of anticoagulants: Secondary | ICD-10-CM

## 2016-03-28 LAB — POCT INR: INR: 2.6

## 2016-04-14 ENCOUNTER — Other Ambulatory Visit: Payer: Self-pay | Admitting: Cardiovascular Disease

## 2016-04-14 MED ORDER — RANOLAZINE ER 500 MG PO TB12: 500.0000 mg | ORAL_TABLET | Freq: Two times a day (BID) | ORAL | 2 refills | Status: DC

## 2016-04-24 ENCOUNTER — Ambulatory Visit (INDEPENDENT_AMBULATORY_CARE_PROVIDER_SITE_OTHER): Payer: Medicare HMO | Admitting: *Deleted

## 2016-04-24 DIAGNOSIS — Z7901 Long term (current) use of anticoagulants: Secondary | ICD-10-CM

## 2016-04-24 DIAGNOSIS — I4891 Unspecified atrial fibrillation: Secondary | ICD-10-CM | POA: Diagnosis not present

## 2016-04-24 LAB — POCT INR: INR: 3.1

## 2016-05-09 ENCOUNTER — Ambulatory Visit (INDEPENDENT_AMBULATORY_CARE_PROVIDER_SITE_OTHER): Payer: Medicare HMO | Admitting: *Deleted

## 2016-05-09 DIAGNOSIS — I495 Sick sinus syndrome: Secondary | ICD-10-CM

## 2016-05-10 ENCOUNTER — Encounter: Payer: Self-pay | Admitting: Cardiology

## 2016-05-10 NOTE — Progress Notes (Signed)
Remote pacemaker transmission.   

## 2016-05-12 LAB — CUP PACEART REMOTE DEVICE CHECK
Battery Remaining Longevity: 93 mo
Battery Remaining Percentage: 73 %
Battery Voltage: 2.92 V
Brady Statistic AS VP Percent: 1 %
Brady Statistic RA Percent Paced: 95 %
Brady Statistic RV Percent Paced: 67 %
Date Time Interrogation Session: 20180410164211
Implantable Lead Implant Date: 20130425
Implantable Lead Location: 753860
Lead Channel Impedance Value: 440 Ohm
Lead Channel Pacing Threshold Amplitude: 0.625 V
Lead Channel Pacing Threshold Pulse Width: 0.4 ms
Lead Channel Sensing Intrinsic Amplitude: 2.1 mV
Lead Channel Setting Pacing Pulse Width: 0.4 ms
Lead Channel Setting Sensing Sensitivity: 2 mV
MDC IDC LEAD IMPLANT DT: 20130425
MDC IDC LEAD LOCATION: 753859
MDC IDC MSMT LEADCHNL RV IMPEDANCE VALUE: 430 Ohm
MDC IDC MSMT LEADCHNL RV PACING THRESHOLD AMPLITUDE: 1.125 V
MDC IDC MSMT LEADCHNL RV PACING THRESHOLD PULSEWIDTH: 0.4 ms
MDC IDC MSMT LEADCHNL RV SENSING INTR AMPL: 5.4 mV
MDC IDC PG IMPLANT DT: 20130425
MDC IDC PG SERIAL: 7322329
MDC IDC SET LEADCHNL RA PACING AMPLITUDE: 1.625
MDC IDC SET LEADCHNL RV PACING AMPLITUDE: 1.375
MDC IDC STAT BRADY AP VP PERCENT: 67 %
MDC IDC STAT BRADY AP VS PERCENT: 29 %
MDC IDC STAT BRADY AS VS PERCENT: 4 %

## 2016-05-22 ENCOUNTER — Ambulatory Visit (INDEPENDENT_AMBULATORY_CARE_PROVIDER_SITE_OTHER): Payer: Medicare HMO | Admitting: Pharmacist

## 2016-05-22 DIAGNOSIS — Z7901 Long term (current) use of anticoagulants: Secondary | ICD-10-CM

## 2016-05-22 DIAGNOSIS — I4891 Unspecified atrial fibrillation: Secondary | ICD-10-CM | POA: Diagnosis not present

## 2016-05-22 LAB — POCT INR: INR: 3.6

## 2016-06-19 ENCOUNTER — Ambulatory Visit (INDEPENDENT_AMBULATORY_CARE_PROVIDER_SITE_OTHER): Payer: Medicare HMO | Admitting: *Deleted

## 2016-06-19 DIAGNOSIS — I4891 Unspecified atrial fibrillation: Secondary | ICD-10-CM | POA: Diagnosis not present

## 2016-06-19 DIAGNOSIS — Z7901 Long term (current) use of anticoagulants: Secondary | ICD-10-CM | POA: Diagnosis not present

## 2016-06-19 LAB — POCT INR: INR: 2.9

## 2016-07-23 ENCOUNTER — Other Ambulatory Visit: Payer: Self-pay | Admitting: Cardiology

## 2016-07-23 ENCOUNTER — Other Ambulatory Visit: Payer: Self-pay | Admitting: Cardiovascular Disease

## 2016-07-23 DIAGNOSIS — I25119 Atherosclerotic heart disease of native coronary artery with unspecified angina pectoris: Secondary | ICD-10-CM

## 2016-07-24 ENCOUNTER — Ambulatory Visit (INDEPENDENT_AMBULATORY_CARE_PROVIDER_SITE_OTHER): Payer: Medicare HMO | Admitting: Pharmacist

## 2016-07-24 DIAGNOSIS — I4891 Unspecified atrial fibrillation: Secondary | ICD-10-CM | POA: Diagnosis not present

## 2016-07-24 DIAGNOSIS — Z7901 Long term (current) use of anticoagulants: Secondary | ICD-10-CM | POA: Diagnosis not present

## 2016-07-24 LAB — POCT INR: INR: 2.5

## 2016-07-24 MED ORDER — ISOSORBIDE MONONITRATE ER 30 MG PO TB24: 30.0000 mg | ORAL_TABLET | Freq: Two times a day (BID) | ORAL | 0 refills | Status: DC

## 2016-08-09 ENCOUNTER — Ambulatory Visit (INDEPENDENT_AMBULATORY_CARE_PROVIDER_SITE_OTHER): Payer: Medicare HMO | Admitting: *Deleted

## 2016-08-09 ENCOUNTER — Telehealth: Payer: Self-pay | Admitting: Cardiology

## 2016-08-09 DIAGNOSIS — I495 Sick sinus syndrome: Secondary | ICD-10-CM

## 2016-08-09 NOTE — Telephone Encounter (Signed)
LMOVM reminding pt to send remote transmission.   

## 2016-08-10 LAB — CUP PACEART REMOTE DEVICE CHECK
Battery Remaining Percentage: 73 %
Brady Statistic AP VP Percent: 65 %
Brady Statistic AP VS Percent: 31 %
Brady Statistic AS VP Percent: 1 %
Brady Statistic AS VS Percent: 3.8 %
Brady Statistic RV Percent Paced: 65 %
Date Time Interrogation Session: 20180711183542
Implantable Lead Implant Date: 20130425
Implantable Lead Location: 753860
Lead Channel Impedance Value: 410 Ohm
Lead Channel Impedance Value: 450 Ohm
Lead Channel Pacing Threshold Amplitude: 1 V
Lead Channel Pacing Threshold Pulse Width: 0.4 ms
Lead Channel Sensing Intrinsic Amplitude: 2.2 mV
Lead Channel Sensing Intrinsic Amplitude: 5.7 mV
MDC IDC LEAD IMPLANT DT: 20130425
MDC IDC LEAD LOCATION: 753859
MDC IDC MSMT BATTERY REMAINING LONGEVITY: 94 mo
MDC IDC MSMT BATTERY VOLTAGE: 2.92 V
MDC IDC MSMT LEADCHNL RA PACING THRESHOLD AMPLITUDE: 0.5 V
MDC IDC MSMT LEADCHNL RA PACING THRESHOLD PULSEWIDTH: 0.4 ms
MDC IDC PG IMPLANT DT: 20130425
MDC IDC SET LEADCHNL RA PACING AMPLITUDE: 1.5 V
MDC IDC SET LEADCHNL RV PACING AMPLITUDE: 1.25 V
MDC IDC SET LEADCHNL RV PACING PULSEWIDTH: 0.4 ms
MDC IDC SET LEADCHNL RV SENSING SENSITIVITY: 2 mV
MDC IDC STAT BRADY RA PERCENT PACED: 96 %
Pulse Gen Model: 2110
Pulse Gen Serial Number: 7322329

## 2016-08-10 NOTE — Progress Notes (Signed)
Remote pacemaker transmission.   

## 2016-08-15 ENCOUNTER — Encounter: Payer: Self-pay | Admitting: Cardiology

## 2016-09-08 ENCOUNTER — Ambulatory Visit (INDEPENDENT_AMBULATORY_CARE_PROVIDER_SITE_OTHER): Payer: Medicare HMO | Admitting: *Deleted

## 2016-09-08 ENCOUNTER — Encounter: Payer: Self-pay | Admitting: Internal Medicine

## 2016-09-08 ENCOUNTER — Ambulatory Visit (INDEPENDENT_AMBULATORY_CARE_PROVIDER_SITE_OTHER): Payer: Medicare HMO | Admitting: Internal Medicine

## 2016-09-08 ENCOUNTER — Ambulatory Visit (INDEPENDENT_AMBULATORY_CARE_PROVIDER_SITE_OTHER): Payer: Medicare HMO | Admitting: Cardiovascular Disease

## 2016-09-08 ENCOUNTER — Encounter: Payer: Self-pay | Admitting: Cardiovascular Disease

## 2016-09-08 VITALS — BP 122/80 | HR 67 | Ht 66.0 in | Wt 193.2 lb

## 2016-09-08 VITALS — BP 122/80 | HR 60 | Ht 66.0 in | Wt 193.4 lb

## 2016-09-08 DIAGNOSIS — I4891 Unspecified atrial fibrillation: Secondary | ICD-10-CM

## 2016-09-08 DIAGNOSIS — I259 Chronic ischemic heart disease, unspecified: Secondary | ICD-10-CM | POA: Diagnosis not present

## 2016-09-08 DIAGNOSIS — I480 Paroxysmal atrial fibrillation: Secondary | ICD-10-CM

## 2016-09-08 DIAGNOSIS — Z7901 Long term (current) use of anticoagulants: Secondary | ICD-10-CM

## 2016-09-08 DIAGNOSIS — Z950 Presence of cardiac pacemaker: Secondary | ICD-10-CM

## 2016-09-08 DIAGNOSIS — I495 Sick sinus syndrome: Secondary | ICD-10-CM | POA: Diagnosis not present

## 2016-09-08 DIAGNOSIS — I10 Essential (primary) hypertension: Secondary | ICD-10-CM

## 2016-09-08 DIAGNOSIS — Z79899 Other long term (current) drug therapy: Secondary | ICD-10-CM

## 2016-09-08 DIAGNOSIS — I25119 Atherosclerotic heart disease of native coronary artery with unspecified angina pectoris: Secondary | ICD-10-CM

## 2016-09-08 LAB — POCT INR: INR: 1.9

## 2016-09-08 NOTE — Patient Instructions (Signed)
Medication Instructions: -  Your physician recommends that you continue on your current medications as directed. Please refer to the Current Medication list given to you today.  Labwork: - Your physician recommends that you rhave lab work today: BMP/ CBC/ Magnesium  Procedures/Testing: - none ordered  Follow-Up: - Remote monitoring is used to monitor your Pacemaker of ICD from home. This monitoring reduces the number of office visits required to check your device to one time per year. It allows [place] to keep an eye on the functioning of your device to ensure it is working properly. You are scheduled for a device check from home on 11/08/16. You may send your transmission at any time that day. If you have a wireless device, the transmission will be sent automatically. After your physician reviews your transmission, you will receive a postcard with your next transmission date.  - Your physician wants you to follow-up in: 6 months with Tommye Standard, PA for Dr. Caryl Comes. You will receive a reminder letter in the mail two months in advance. If you don't receive a letter, please call our office to schedule the follow-up appointment.   Any Additional Special Instructions Will Be Listed Below (If Applicable).     If you need a refill on your cardiac medications before your next appointment, please call your pharmacy.

## 2016-09-08 NOTE — Progress Notes (Signed)
Chief Complaint  Patient presents with  . Follow-up   History of Present Illness: 76 yo WM with history of CAD s/p 5V CABG 2005, atrial fibrillation/flutter, HTN, HLD, sinus node dysfunction s/p pacemaker implantation who is here today for cardiac follow up. Atrial flutter diagnosed in 2012. He has been followed by Dr. Caryl Comes. He saw Dr. Caryl Comes in April 2013 and was started on dofetilide for atrial fibrillation. He had post-termination pauses and had a pacemaker implanted in April 2013. He has since had Tikosyn stopped due to cost and was started on sotalol. He was switched from Xarelto to coumadin due to cost. Last echo in September 2014 with LVEF=55-60%, mild LVH, no significant valve disease. Stress myoview 09/01/13 with no ischemia. He was seen in our office May 2016 by Richardson Dopp, PA-C with c/o chest pain. Stress myoview was ordered but was cancelled by patient due to cost. I saw him in January 2017 and he c/o daily sharp chest pains. Imdur was added. I saw him in August 2017 and he continue to have chest pain several days per week. Cardiac cath 09/22/15 with 3 patent grafts but could not visualize the other two grafts. The RCA was occluded proximally with no graft to this vessel. We discussed a cardiac CTA but he refused due to claustrophobia. Ranexa added to his regimen in December 2017.   He is here today for follow up. The patient denies dyspnea, palpitations, lower extremity edema, orthopnea, PND, dizziness, near syncope or syncope. He has rare chest pains with moderate exertion. Resolves with rest. This is at his baseline for his chronic angina.    Primary Care Physician: Verdell Carmine., MD   Past Medical History:  Diagnosis Date  . Angina   . Arthritis    "in my back"  . Atrial fibrillation/flutter   . BPH (benign prostatic hyperplasia)   . CAD (coronary artery disease) Dec 2005   Hx MI. 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  Central obesity   . Chronic back pain   . Gout    "once; I have it under control w/medicine"  . Hyperlipidemia   . Hypertension   . Pacemaker   . Sinus node dysfunction (HCC)    post termination pauses <5sec    Past Surgical History:  Procedure Laterality Date  . CARDIAC CATHETERIZATION  2005   "that what sent me to CABG"  . CARDIAC CATHETERIZATION N/A 09/22/2015   Procedure: Left Heart Cath and Cors/Grafts Angiography;  Surgeon: Burnell Blanks, MD;  Location: [place] [facility];  Service: Cardiovascular;  Laterality: N/A;  . CATARACT EXTRACTION W/ INTRAOCULAR LENS  IMPLANT, BILATERAL  ~ 01/2011  . CHOLECYSTECTOMY  1978   . CORONARY ARTERY BYPASS GRAFT  2005   in [place] - LIMA to LAD, SVG to diagonal, SVG to ramus, SVG to OM and SVG to RCA   . PACEMAKER INSERTION  2014  . PERMANENT PACEMAKER INSERTION N/A 05/25/2011   Procedure: PERMANENT PACEMAKER INSERTION;  Surgeon: [name], MD;  Location: [facility] C[facility];  Service: Cardiovascular;  Laterality: N/A;  . TRANSURETHRAL RESECTION OF PROSTATE N/A 12/29/2013   Procedure: TRANSURETHRAL RESECTION OF THE PROSTATE WITH GYRUS INSTRUMENTS;  Surgeon: [name], MD;  Location: [facility];  Service: Urology;  Laterality: N/A;    Current Outpatient Prescriptions  Medication Sig Dispense Refill  . allopurinol (ZYLOPRIM) 300 MG tablet Take 300 mg by mouth  every morning.     Marland Kitchen atorvastatin (LIPITOR) 40 MG tablet Take 1 tablet (40 mg total) by mouth every evening. 90 tablet 1  . carvedilol (COREG) 3.125 MG tablet TAKE 1 TABLET TWICE DAILY WITH MEALS 180 tablet 0  . Cholecalciferol (VITAMIN D PO) Take 5,000 Units by mouth daily.    . isosorbide mononitrate (IMDUR) 30 MG 24 hr tablet Take 1 tablet (30 mg total) by mouth 2 (two) times daily. 180 tablet 0  . lisinopril (PRINIVIL,ZESTRIL) 20 MG tablet Take 20 mg by mouth at bedtime.     Marland Kitchen loratadine (CLARITIN) 10 MG tablet Take 10 mg by mouth every other day.    Marland Kitchen NITROSTAT 0.4 MG SL  tablet DISSOLVE ONE TABLET UNDER THE TONGUE EVERY 5 MINUTES AS NEEDED FOR CHEST PAIN. DO NOT EXCEED A TOTAL OF 3 DOSES IN 15 MINUTES 25 tablet 2  . Omega-3 Fatty Acids (FISH OIL PO) Take 1 capsule by mouth daily.    . ranolazine (RANEXA) 500 MG 12 hr tablet Take 1 tablet (500 mg total) by mouth 2 (two) times daily. 180 tablet 2  . sotalol (BETAPACE) 120 MG tablet TAKE 1 TABLET EVERY 12 HOURS 180 tablet 0  . warfarin (COUMADIN) 5 MG tablet TAKE 1 TABLET AS DIRECTED OR AS DIRECTED BY THE COUMADIN CLINIC 90 tablet 2   No current facility-administered medications for this visit.     Allergies  Allergen Reactions  . Sulfa Antibiotics Other (See Comments)    SWELLING ON PATIENTS LIPS, HE GETS DRIED UP    Social History   Social History  . Marital status: Married    Spouse name: N/A  . Number of children: 2  . Years of education: N/A   Occupational History  . retired-truck driver   .  Retired   Social History Main Topics  . Smoking status: Former Smoker    Packs/day: 0.00    Years: 0.00    Types: Cigarettes    Quit date: 01/30/1961  . Smokeless tobacco: Never Used  . Alcohol use No     Comment: 2 drinks (bourbon) a night  . Drug use: No  . Sexual activity: Yes   Other Topics Concern  . Not on file   Social History Narrative   Married. Pt works part-time for a [employer]. Quit tobacco 50 years ago..Both parents are deceased, neither one had cardiac issues and no siblings have cardiac issues.     Family History  Problem Relation Age of Onset  . Cancer Mother   . Stroke Brother   . Heart attack Neg Hx     Review of Systems:  As stated in the HPI and otherwise negative.   BP 122/80   Pulse 60   Ht 5\' 6"  (1.676 m)   Wt 193 lb 6.4 oz (87.7 kg)   SpO2 98%   BMI 31.22 kg/m   Physical Examination:  General: Well developed, well nourished, NAD  HEENT: OP clear, mucus membranes moist  SKIN: warm, dry. No rashes. Neuro: No focal deficits  Musculoskeletal: Muscle  strength 5/5 all ext  Psychiatric: Mood and affect normal  Neck: No JVD, no carotid bruits, no thyromegaly, no lymphadenopathy.  Lungs:Clear bilaterally, no wheezes, rhonci, crackles Cardiovascular: Regular rate and rhythm. No murmurs, gallops or rubs. Abdomen:Soft. Bowel sounds present. Non-tender.  Extremities: No lower extremity edema. Pulses are 2 + in the bilateral DP/PT.  Echo 09/3012: Left ventricle: The cavity size was normal. Wall thickness was increased in a pattern  of mild LVH. Systolic function was normal. The estimated ejection fraction was in the range of 55% to 60%. Wall motion was normal; there were no regional wall motion abnormalities. Doppler parameters are consistent with abnormal left ventricular relaxation (grade 1 diastolic dysfunction).  EKG:  EKG is not ordered today. The ekg ordered today demonstrates   Recent Labs: 09/21/2015: BUN 16; Creat 0.94; Hemoglobin 14.2; Platelets 189; Potassium 4.4; Sodium 139   Lipid Panel Followed in primary care   Wt Readings from Last 3 Encounters:  09/08/16 193 lb 6.4 oz (87.7 kg)  02/08/16 200 lb (90.7 kg)  01/14/16 198 lb 12.8 oz (90.2 kg)     Other studies Reviewed: Additional studies/ records that were reviewed today include: . Review of the above records demonstrates:   Assessment and Plan:   1. CAD with stable angina: He has stable angina on Ranexa and Imdur. He is also on a statin and beta blocker. No ASA since he is on coumadin. Last cardiac cath in August 2017 but I could not find two of his grafts. He has refused coronary CTA to better define his graft anatomy. He is claustrophobic. I do not think further imaging is indicated right now since he is doing so well.   2. Atrial fibrillation, paroxysmal: No complaints. He is on sotalol and coumadin. He is followed in EP. Pacemaker in place.   3. HTN: BP controlled. No changes.   Current medicines are reviewed at length with the patient today.  The patient does  not have concerns regarding medicines.  The following changes have been made:  no change  Labs/ tests ordered today include:   No orders of the defined types were placed in this encounter.   Disposition:   FU with me in 6 months  Signed, Lauree Chandler, MD 09/08/2016 11:37 AM    [facility] [address], [place], [place]  [address] Phone: [phone]; Fax: [phone]

## 2016-09-08 NOTE — Progress Notes (Signed)
Patient Care Team: Verdell Carmine., MD as PCP - General (Family Medicine)   HPI  Dylan Roth is a 76 y.o. male Seen in followup for atrial fibrillation with  posttermination pauses for which he underwent pacemaker implantation 4/13. He has been treated with sotalol and Rivaroxaban; he ended up back on warfarin because of bleeding issues  He has infrequent palpitations.   He has a history of ischemic heart disease with prior bypass surgery 2005. Ejection fraction by echo 9/14 was normal Stress myoview 7/15 with no ischemia  Ongoing vague chest pains  Notes no significant sob no edema or bleedng  Date Cr K Mg Hgb  8/17  0.94 4.4 1.9 14.2   12/17      15.2                Past Medical History:  Diagnosis Date  . Angina   . Arthritis    "in my back"  . Atrial fibrillation/flutter   . BPH (benign prostatic hyperplasia)   . CAD (coronary artery disease) Dec 2005   Hx MI. 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 Central obesity   . Chronic back pain   . Gout    "once; I have it under control w/medicine"  . Hyperlipidemia   . Hypertension   . Pacemaker   . Sinus node dysfunction (HCC)    post termination pauses <5sec    Past Surgical History:  Procedure Laterality Date  . CARDIAC CATHETERIZATION  2005   "that what sent me to CABG"  . CARDIAC CATHETERIZATION N/A 09/22/2015   Procedure: Left Heart Cath and Cors/Grafts Angiography;  Surgeon: Burnell Blanks, MD;  Location: [place] [facility];  Service: Cardiovascular;  Laterality: N/A;  . CATARACT EXTRACTION W/ INTRAOCULAR LENS  IMPLANT, BILATERAL  ~ 01/2011  . CHOLECYSTECTOMY  1978   . CORONARY ARTERY BYPASS GRAFT  2005   in [place] - LIMA to LAD, SVG to diagonal, SVG to ramus, SVG to OM and SVG to RCA   . PACEMAKER INSERTION  2014  . PERMANENT PACEMAKER INSERTION N/A 05/25/2011   Procedure: PERMANENT PACEMAKER INSERTION;  Surgeon: [name], MD;   Location: [facility] CATH LAB;  Service: Cardiovascular;  Laterality: N/A;  . TRANSURETHRAL RESECTION OF PROSTATE N/A 12/29/2013   Procedure: TRANSURETHRAL RESECTION OF THE PROSTATE WITH GYRUS INSTRUMENTS;  Surgeon: [name], MD;  Location: [facility];  Service: Urology;  Laterality: N/A;    Current Outpatient Prescriptions  Medication Sig Dispense Refill  . allopurinol (ZYLOPRIM) 300 MG tablet Take 300 mg by mouth every morning.     Marland Kitchen atorvastatin (LIPITOR) 40 MG tablet Take 1 tablet (40 mg total) by mouth every evening. 90 tablet 1  . carvedilol (COREG) 3.125 MG tablet TAKE 1 TABLET TWICE DAILY WITH MEALS 180 tablet 0  . Cholecalciferol (VITAMIN D PO) Take 5,000 Units by mouth daily.    . isosorbide mononitrate (IMDUR) 30 MG 24 hr tablet Take 1 tablet (30 mg total) by mouth 2 (two) times daily. 180 tablet 0  . lisinopril (PRINIVIL,ZESTRIL) 20 MG tablet Take 20 mg by mouth at bedtime.     Marland Kitchen loratadine (CLARITIN) 10 MG tablet Take 10 mg by mouth every other day.    Marland Kitchen NITROSTAT 0.4 MG SL tablet DISSOLVE ONE TABLET UNDER THE TONGUE EVERY 5 MINUTES AS NEEDED FOR CHEST PAIN. DO NOT EXCEED A TOTAL OF 3 DOSES IN  15 MINUTES 25 tablet 2  . Omega-3 Fatty Acids (FISH OIL PO) Take 1 capsule by mouth daily.    . ranolazine (RANEXA) 500 MG 12 hr tablet Take 1 tablet (500 mg total) by mouth 2 (two) times daily. 180 tablet 2  . sotalol (BETAPACE) 120 MG tablet TAKE 1 TABLET EVERY 12 HOURS 180 tablet 0  . warfarin (COUMADIN) 5 MG tablet TAKE 1 TABLET AS DIRECTED OR AS DIRECTED BY THE COUMADIN CLINIC 90 tablet 2   No current facility-administered medications for this visit.     Allergies  Allergen Reactions  . Sulfa Antibiotics Other (See Comments)    SWELLING ON PATIENTS LIPS, HE GETS DRIED UP    Review of Systems negative except from HPI and PMH  Physical Exam BP 122/80   Pulse 67   Ht 5' 6"  (1.676 m)   Wt 193 lb 4 oz (87.7 kg)   SpO2 98%   BMI 31.19 kg/m  Well developed and nourished in no acute  distress HENT normal Neck supple with JVP-flat Clear Regular rate and rhythm, no murmurs or gallops Abd-soft with active BS No Clubbing cyanosis edema Skin-warm and dry A & Oriented  Grossly normal sensory and motor function  ECG: AV pacing at 66 2215/48 Inferolateral/anterolateral T wave inversions Unchanged from 1/17  Assessment and  Plan  Ischemic heart disease with prior bypass surgery  Exertional dyspnea-atypical  Sinus node dysfunction and chronotropic incompetence  Pacemaker-St. Jude The patient's device was interrogated.  The information was reviewed. No changes were made in the programming.     Anxiety   High Risk Medication Surveillance  AF paroxsymal  AV conduction 1 AVBlock   Will check labs for sotalol and on anticoagulation   No prolonged intercurrent atrial fibrillation or flutter   disucssed nitrate dosing with Dr Loletha Grayer MAc    progressive AV conduction delay results in increasing ventricular pacing now at 65% despite activation of VIP. This may have known for impact on LV function   Last EF 2017 was normlal

## 2016-09-08 NOTE — Patient Instructions (Signed)

## 2016-09-09 LAB — BASIC METABOLIC PANEL
BUN / CREAT RATIO: 22 (ref 10–24)
BUN: 23 mg/dL (ref 8–27)
CO2: 25 mmol/L (ref 20–29)
CREATININE: 1.03 mg/dL (ref 0.76–1.27)
Calcium: 9.2 mg/dL (ref 8.6–10.2)
Chloride: 99 mmol/L (ref 96–106)
GFR calc Af Amer: 82 mL/min/{1.73_m2} (ref >59)
GFR, EST NON AFRICAN AMERICAN: 71 mL/min/{1.73_m2} (ref >59)
Glucose: 79 mg/dL (ref 65–99)
Potassium: 4.9 mmol/L (ref 3.5–5.2)
SODIUM: 137 mmol/L (ref 134–144)

## 2016-09-09 LAB — CBC WITH DIFFERENTIAL/PLATELET
BASOS ABS: 0 10*3/uL (ref 0.0–0.2)
Basos: 0 %
EOS (ABSOLUTE): 0.2 10*3/uL (ref 0.0–0.4)
EOS: 3 %
HEMATOCRIT: 42.9 % (ref 37.5–51.0)
HEMOGLOBIN: 14.1 g/dL (ref 13.0–17.7)
IMMATURE GRANULOCYTES: 0 %
Immature Grans (Abs): 0 10*3/uL (ref 0.0–0.1)
Lymphocytes Absolute: 1.9 10*3/uL (ref 0.7–3.1)
Lymphs: 30 %
MCH: 32.9 pg (ref 26.6–33.0)
MCHC: 32.9 g/dL (ref 31.5–35.7)
MCV: 100 fL — ABNORMAL HIGH (ref 79–97)
MONOCYTES: 9 %
Monocytes Absolute: 0.6 10*3/uL (ref 0.1–0.9)
Neutrophils Absolute: 3.6 10*3/uL (ref 1.4–7.0)
Neutrophils: 58 %
Platelets: 220 10*3/uL (ref 150–379)
RBC: 4.29 x10E6/uL (ref 4.14–5.80)
RDW: 15.5 % — AB (ref 12.3–15.4)
WBC: 6.3 10*3/uL (ref 3.4–10.8)

## 2016-09-09 LAB — MAGNESIUM: Magnesium: 2.1 mg/dL (ref 1.6–2.3)

## 2016-09-11 ENCOUNTER — Telehealth: Payer: Self-pay

## 2016-09-11 NOTE — Telephone Encounter (Signed)
Pt is aware and agreeable to normal results  

## 2016-09-11 NOTE — Telephone Encounter (Signed)
lmtcb for lab results

## 2016-09-12 LAB — CUP PACEART INCLINIC DEVICE CHECK
Battery Voltage: 2.92 V
Brady Statistic RA Percent Paced: 96 %
Brady Statistic RV Percent Paced: 65 %
Date Time Interrogation Session: 20180810165726
Implantable Lead Implant Date: 20130425
Implantable Lead Location: 753860
Lead Channel Impedance Value: 437.5 Ohm
Lead Channel Impedance Value: 475 Ohm
Lead Channel Pacing Threshold Amplitude: 0.5 V
Lead Channel Pacing Threshold Pulse Width: 0.4 ms
Lead Channel Sensing Intrinsic Amplitude: 3.1 mV
Lead Channel Sensing Intrinsic Amplitude: 4.8 mV
MDC IDC LEAD IMPLANT DT: 20130425
MDC IDC LEAD LOCATION: 753859
MDC IDC MSMT BATTERY REMAINING LONGEVITY: 90 mo
MDC IDC MSMT LEADCHNL RV PACING THRESHOLD AMPLITUDE: 1.25 V
MDC IDC MSMT LEADCHNL RV PACING THRESHOLD PULSEWIDTH: 0.4 ms
MDC IDC PG IMPLANT DT: 20130425
MDC IDC SET LEADCHNL RA PACING AMPLITUDE: 1.5 V
MDC IDC SET LEADCHNL RV PACING AMPLITUDE: 1.25 V
MDC IDC SET LEADCHNL RV PACING PULSEWIDTH: 0.4 ms
MDC IDC SET LEADCHNL RV SENSING SENSITIVITY: 2 mV
Pulse Gen Model: 2110
Pulse Gen Serial Number: 7322329

## 2016-09-28 ENCOUNTER — Other Ambulatory Visit: Payer: Self-pay | Admitting: Cardiovascular Disease

## 2016-10-13 ENCOUNTER — Ambulatory Visit (INDEPENDENT_AMBULATORY_CARE_PROVIDER_SITE_OTHER): Payer: Medicare HMO

## 2016-10-13 DIAGNOSIS — Z7901 Long term (current) use of anticoagulants: Secondary | ICD-10-CM | POA: Diagnosis not present

## 2016-10-13 DIAGNOSIS — I4891 Unspecified atrial fibrillation: Secondary | ICD-10-CM | POA: Diagnosis not present

## 2016-10-13 LAB — POCT INR: INR: 2.6

## 2016-11-08 ENCOUNTER — Ambulatory Visit (INDEPENDENT_AMBULATORY_CARE_PROVIDER_SITE_OTHER): Payer: Medicare HMO | Admitting: *Deleted

## 2016-11-08 ENCOUNTER — Telehealth: Payer: Self-pay | Admitting: Cardiology

## 2016-11-08 DIAGNOSIS — I495 Sick sinus syndrome: Secondary | ICD-10-CM | POA: Diagnosis not present

## 2016-11-08 NOTE — Telephone Encounter (Signed)
LMOVM reminding pt to send remote transmission.   

## 2016-11-09 NOTE — Progress Notes (Signed)
Remote pacemaker transmission.   

## 2016-11-10 ENCOUNTER — Encounter: Payer: Self-pay | Admitting: Cardiology

## 2016-11-24 ENCOUNTER — Ambulatory Visit (INDEPENDENT_AMBULATORY_CARE_PROVIDER_SITE_OTHER): Payer: Medicare HMO

## 2016-11-24 DIAGNOSIS — I4891 Unspecified atrial fibrillation: Secondary | ICD-10-CM

## 2016-11-24 DIAGNOSIS — Z7901 Long term (current) use of anticoagulants: Secondary | ICD-10-CM

## 2016-11-24 LAB — POCT INR: INR: 2.4

## 2016-11-27 ENCOUNTER — Other Ambulatory Visit: Payer: Self-pay | Admitting: Cardiovascular Disease

## 2016-11-27 ENCOUNTER — Other Ambulatory Visit: Payer: Self-pay | Admitting: Cardiology

## 2016-11-27 DIAGNOSIS — I25119 Atherosclerotic heart disease of native coronary artery with unspecified angina pectoris: Secondary | ICD-10-CM

## 2016-11-27 NOTE — Telephone Encounter (Signed)
REFILL 

## 2016-11-28 LAB — CUP PACEART REMOTE DEVICE CHECK
Battery Remaining Longevity: 94 mo
Brady Statistic AP VS Percent: 25 %
Brady Statistic AS VP Percent: 1 %
Brady Statistic AS VS Percent: 22 %
Date Time Interrogation Session: 20181010161853
Lead Channel Impedance Value: 440 Ohm
Lead Channel Pacing Threshold Amplitude: 1 V
Lead Channel Pacing Threshold Pulse Width: 0.4 ms
Lead Channel Sensing Intrinsic Amplitude: 5.2 mV
Lead Channel Setting Pacing Amplitude: 1.25 V
Lead Channel Setting Pacing Amplitude: 1.5 V
MDC IDC LEAD IMPLANT DT: 20130425
MDC IDC LEAD IMPLANT DT: 20130425
MDC IDC LEAD LOCATION: 753859
MDC IDC LEAD LOCATION: 753860
MDC IDC MSMT BATTERY REMAINING PERCENTAGE: 73 %
MDC IDC MSMT BATTERY VOLTAGE: 2.92 V
MDC IDC MSMT LEADCHNL RA PACING THRESHOLD AMPLITUDE: 0.5 V
MDC IDC MSMT LEADCHNL RA PACING THRESHOLD PULSEWIDTH: 0.4 ms
MDC IDC MSMT LEADCHNL RA SENSING INTR AMPL: 2.9 mV
MDC IDC MSMT LEADCHNL RV IMPEDANCE VALUE: 430 Ohm
MDC IDC PG IMPLANT DT: 20130425
MDC IDC SET LEADCHNL RV PACING PULSEWIDTH: 0.4 ms
MDC IDC SET LEADCHNL RV SENSING SENSITIVITY: 2 mV
MDC IDC STAT BRADY AP VP PERCENT: 52 %
MDC IDC STAT BRADY RA PERCENT PACED: 73 %
MDC IDC STAT BRADY RV PERCENT PACED: 55 %
Pulse Gen Model: 2110
Pulse Gen Serial Number: 7322329

## 2017-01-05 ENCOUNTER — Ambulatory Visit (INDEPENDENT_AMBULATORY_CARE_PROVIDER_SITE_OTHER): Payer: Medicare HMO | Admitting: *Deleted

## 2017-01-05 DIAGNOSIS — Z7901 Long term (current) use of anticoagulants: Secondary | ICD-10-CM

## 2017-01-05 DIAGNOSIS — I4891 Unspecified atrial fibrillation: Secondary | ICD-10-CM

## 2017-01-05 LAB — POCT INR: INR: 2.3

## 2017-01-05 NOTE — Patient Instructions (Signed)
Continue taking 1/2 tablet everyday. Recheck in 6 weeks. Coumadin Clinic [phone]

## 2017-02-07 ENCOUNTER — Telehealth: Payer: Self-pay | Admitting: Cardiology

## 2017-02-07 ENCOUNTER — Encounter: Payer: Medicare HMO | Admitting: *Deleted

## 2017-02-07 NOTE — Telephone Encounter (Signed)
LMOVM reminding pt to send remote transmission.   

## 2017-02-08 ENCOUNTER — Encounter: Payer: Self-pay | Admitting: Cardiology

## 2017-02-16 ENCOUNTER — Ambulatory Visit (INDEPENDENT_AMBULATORY_CARE_PROVIDER_SITE_OTHER): Payer: Medicare HMO | Admitting: *Deleted

## 2017-02-16 DIAGNOSIS — Z7901 Long term (current) use of anticoagulants: Secondary | ICD-10-CM | POA: Diagnosis not present

## 2017-02-16 DIAGNOSIS — I4891 Unspecified atrial fibrillation: Secondary | ICD-10-CM

## 2017-02-16 LAB — POCT INR: INR: 2.1

## 2017-02-16 NOTE — Patient Instructions (Signed)
Description   Continue taking 1/2 tablet everyday. Recheck in 6 weeks. Coumadin Clinic [phone]

## 2017-02-19 ENCOUNTER — Ambulatory Visit (INDEPENDENT_AMBULATORY_CARE_PROVIDER_SITE_OTHER): Payer: Medicare HMO | Admitting: *Deleted

## 2017-02-19 DIAGNOSIS — I495 Sick sinus syndrome: Secondary | ICD-10-CM

## 2017-02-20 ENCOUNTER — Encounter: Payer: Self-pay | Admitting: Cardiology

## 2017-02-20 NOTE — Progress Notes (Signed)
Remote pacemaker transmission.   

## 2017-03-12 ENCOUNTER — Encounter: Payer: Medicare HMO | Admitting: Physician Assistant

## 2017-03-17 LAB — CUP PACEART REMOTE DEVICE CHECK
Date Time Interrogation Session: 20190216130437
Implantable Lead Implant Date: 20130425
Implantable Lead Location: 753859
Implantable Lead Location: 753860
Implantable Pulse Generator Implant Date: 20130425
Lead Channel Setting Pacing Amplitude: 1.5 V
Lead Channel Setting Pacing Pulse Width: 0.4 ms
MDC IDC LEAD IMPLANT DT: 20130425
MDC IDC PG SERIAL: 7322329
MDC IDC SET LEADCHNL RV PACING AMPLITUDE: 1.25 V
MDC IDC SET LEADCHNL RV SENSING SENSITIVITY: 2 mV

## 2017-03-26 NOTE — Progress Notes (Addendum)
Cardiology Office Note Date:  03/26/2017  Patient ID:  Dylan Roth, DOB 13-Sep-1940, MRN  PCP:  Verdell Carmine., MD  Cardiologist:  Dr. Angelena Form Electrophysiologist: Dr. Caryl Comes   Chief Complaint: routine EP/device visit  History of Present Illness: Dylan Roth is a 77 y.o. male with history of CAD (CABG 2005), PFib, post termination pauses/sinus node dysfunction w/PPM, HTN, HLD, and Obesity He saw Dr. Angelena Form 01/14/16 who discussed he had cardiac cath 09/22/15 with 3 patent grafts but could not visualize the other two grafts. The RCA was occluded proximally with no graft to this vessel. We discussed a cardiac CTA but he refused due to claustrophobia, he was having ongoing but stable c/o daily CP and Ranexa was added to his regime,  Thoughts on CTA were to try and identify the other to SVGs.  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 CP was very reproducible chest wall tenderness at his sternum and describes an aching type discomfort when he wakes that self resolves.  He felt the Ranexa had helped him significantly though is expensive.  He denied any near syncope or syncope, described mild orthostatic lightheadedness on occasion, no palpitations or SOB.  Denied any bleeding or signs of bleeding.  He last saw Dr. Caryl Comes in Aug 2018, no changes were made, noting > 65% V pacing, thoughts this may impact LV function in the future, labs for sotalol and a/c were done. Lytes, renal function, H/H were stable.  He is doing well.  Has a small farm with a few animals that he keeps busy with, no difficulty with these activities or his ADLs.  He mentions a fleeting type CP that he has had unchanged no for "many years", "all my life", none otherwise, no dizziness, near syncope or syncope.  He will feel brief  quick beats now and then.  He states his PMD suggested he get his carotids checked 2/2 some feeling a little off balance though he thinks was his new eye glasses that were bifocal and new.  He has opted for wearing none for distance (not needed for distance) and using readers, and his symptom resolved.  Warfarin is monitored at the coumadin clinic, no bleeding or signs of bleeding reported  Device Information SJM dual chamber PPM implanted 05/25/11, Dr. Lovena Le, SSSx  AFib Hx Dx 2012, ?flutter Tikosyn stopped 2/2 cost >> Sotalol Xarelto stopped 2/2 cost ? bleeding>> coumadin    Past Medical History:  Diagnosis Date  . Angina   . Arthritis    "in my back"  . Atrial fibrillation/flutter   . BPH (benign prostatic hyperplasia)   . CAD (coronary artery disease) Dec 2005   Hx MI. 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 Central obesity   . Chronic back pain   . Gout    "once; I have it under control w/medicine"  . Hyperlipidemia   . Hypertension   . Pacemaker   . Sinus node dysfunction (HCC)    post termination pauses <5sec    Past Surgical History:  Procedure Laterality Date  . CARDIAC CATHETERIZATION  2005   "that what sent me to CABG"  . CARDIAC CATHETERIZATION N/A 09/22/2015   Procedure: Left Heart Cath and Cors/Grafts Angiography;  Surgeon: Burnell Blanks, MD;  Location: [facility];  Service: Cardiovascular;  Laterality: N/A;  . CATARACT EXTRACTION W/ INTRAOCULAR LENS  IMPLANT, BILATERAL  ~ 01/2011  . CHOLECYSTECTOMY  1978   . CORONARY ARTERY BYPASS GRAFT  2005   in [place] - LIMA to LAD, SVG to diagonal, SVG to ramus, SVG to OM and SVG to RCA   . PACEMAKER INSERTION  2014  . PERMANENT PACEMAKER INSERTION N/A 05/25/2011   Procedure: PERMANENT PACEMAKER INSERTION;  Surgeon: [name], MD;  Location: [facility];  Service: Cardiovascular;  Laterality: N/A;  . TRANSURETHRAL RESECTION OF PROSTATE  N/A 12/29/2013   Procedure: TRANSURETHRAL RESECTION OF THE PROSTATE WITH GYRUS INSTRUMENTS;  Surgeon: [name], MD;  Location: [facility];  Service: Urology;  Laterality: N/A;    Current Outpatient Medications  Medication Sig Dispense Refill  . allopurinol (ZYLOPRIM) 300 MG tablet Take 300 mg by mouth every morning.     Marland Kitchen atorvastatin (LIPITOR) 40 MG tablet Take 1 tablet (40 mg total) by mouth every evening. 90 tablet 1  . carvedilol (COREG) 3.125 MG tablet TAKE 1 TABLET TWICE DAILY WITH MEALS 180 tablet 0  . Cholecalciferol (VITAMIN D PO) Take 5,000 Units by mouth daily.    . isosorbide mononitrate (IMDUR) 30 MG 24 hr tablet TAKE 1 TABLET TWICE DAILY 180 tablet 1  . lisinopril (PRINIVIL,ZESTRIL) 20 MG tablet Take 20 mg by mouth at bedtime.     Marland Kitchen loratadine (CLARITIN) 10 MG tablet Take 10 mg by mouth every other day.    . nitroGLYCERIN (NITROSTAT) 0.4 MG SL tablet DISSOLVE ONE TABLET UNDER THE TONGUE EVERY 5 MINUTES AS NEEDED FOR CHEST PAIN.  DO NOT EXCEED A TOTAL OF 3 DOSES IN 15 MINUTES 25 tablet 5  . Omega-3 Fatty Acids (FISH OIL PO) Take 1 capsule by mouth daily.    . ranolazine (RANEXA) 500 MG 12 hr tablet Take 1 tablet (500 mg total) by mouth 2 (two) times daily. 180 tablet 2  . sotalol (BETAPACE) 120 MG tablet TAKE 1 TABLET EVERY 12 HOURS 180 tablet 0  . warfarin (COUMADIN) 5 MG tablet TAKE 1 TABLET AS DIRECTED OR AS DIRECTED BY THE COUMADIN CLINIC 90 tablet 2   No current facility-administered medications for this visit.     Allergies:   Sulfa antibiotics   Social History:  The patient  reports that he quit smoking about 56 years ago. His smoking use included cigarettes. He smoked 0.00 packs per day for 0.00 years. he has never used smokeless tobacco. He reports that he does not drink alcohol or use drugs.   Family History:  The patient's family history includes Cancer in his mother; Stroke in his brother.  ROS:  Please see the history of present illness.    All other systems  are reviewed and otherwise negative.   PHYSICAL EXAM:  VS:  There were no vitals taken for this visit. BMI: There is no height or weight on file to calculate BMI. Well nourished, well developed, in no acute distress  HEENT: normocephalic, atraumatic  Neck: no JVD, carotid bruits or masses Cardiac:  RRR no significant murmurs, no rubs, or gallops Lungs:  CTA b/l, no wheezing, rhonchi or rales  Abd: soft, nontender MS: no deformity, age appropriate atrophy Ext: no edema  Skin: warm and dry, no rash Neuro:  No gross deficits appreciated Psych: euthymic mood, full affect  PPM site is stable, no tethering or discomfort   EKG:  Done today and reviewed by myself A paced, 1st degree AVblock, PR 26ms, QRS 30ms, QTc 449ms, T changes likely 2/2 T wave memory, appear similar to older non-paced EKGs PPM interrogation done today and reviewed by myself notes: battery and lead measurements are good.  + AMS episodes, AF burden is 2.6%, rate controlled, majority are of very brief duration,  He presents AP/VS rhythm, AP 88%, VP 64%   10/29/12: TTE Study Conclusions Left ventricle: The cavity size was normal. Wall thickness was increased in a pattern of mild LVH. Systolic function was normal. The estimated ejection fraction was in the range of 55% to 60%. Wall motion was normal; there were no regional wall motion abnormalities. Doppler parameters are consistent with abnormal left ventricular relaxation (grade 1 diastolic dysfunction).  09/22/15: LHC Conclusion    Prox RCA to Mid RCA lesion, 100 %stenosed.  Ost 2nd Diag lesion, 100 %stenosed.  SVG and is normal in caliber and anatomically normal.  LM lesion, 60 %stenosed.  Ost Cx lesion, 50 %stenosed.  Ost 1st Mrg lesion, 100 %stenosed.  SVG graft was visualized by angiography and is normal in caliber and anatomically normal.  Prox LAD lesion, 100 %stenosed.  LIMA graft was visualized by angiography and is normal in caliber and  anatomically normal.  1st Diag lesion, 90 %stenosed.   1. Severe triple vessel CAD s/p ?5V CABG with 3/5 patent bypass grafts. [place]. The documentation in our record over the years states 5V CABG. No vein graft markers found today so I cannot exclude patency of 2 other vein grafts. Unable to identify vein grafts with aortic root angiogram.  2. Occluded mid LAD with filling of the mid and distal vessel from the patent LIMA graft 3. Occluded intermediate and diagonal branches which fill from the patent vein grafts.  4. Cannot identify a graft to the Diagonal or the RCA 5. Moderate left main stenosis supplying the moderate caliber circumflex.  6. Dilatation of the aortic root.   Recommendations: No targets for PCI but cannot visualize 2 additional vein grafts that are reported in his medical record. Will arrange Coronary CTA to see if we can visualize additional bypass grafts and also to get a better estimate of the size of his aortic root.     Recent Labs: 09/08/2016: BUN 23; Creatinine, Ser 1.03; Hemoglobin 14.1; Magnesium 2.1; Platelets 220; Potassium 4.9; Sodium 137  No results found for requested labs within last 8760 hours.   CrCl cannot be calculated (Patient's most recent lab result is older than the maximum 21 days allowed.).   Wt Readings from Last 3 Encounters:  09/08/16 193 lb 4 oz (87.7 kg)  09/08/16 193 lb 6.4 oz (87.7 kg)  02/08/16 200 lb (90.7 kg)     Other studies reviewed: Additional studies/records reviewed today include: summarized above  ASSESSMENT AND PLAN:  1. Sick sinus syndrome w/PPM     Intact device function, no changes made today  2. Paroxysmal AFib     CHA2DS2Vasc is at least 4 on Warfarin     Sotalol QTc is stable     BMET, mag level today  3. CAD     No anginal sounding complaints      C/w Dr. Angelena Form, sees him next month  4. HTN     A little elevated today, no changes, follow with Dr. Angelena Form next  month   No bruits on exam, no ongoing balance issues when not wearing his new glasses, likely  the new RX (bifocals) offered to go ahead and get carotid US, he prefers to d/w Dr. Angelena Form   Disposition: Q 3 month remote device checks, 6 mo EP in-clinic visit given sotalol.   Current medicines are reviewed at length with the patient today.  The patient did not have any concerns regarding medicines.  Haywood Lasso, PA-C 03/26/2017 8:37 AM     [facility] [address] [place] [address] [phone] (office)  [phone] (fax)

## 2017-03-27 ENCOUNTER — Ambulatory Visit (INDEPENDENT_AMBULATORY_CARE_PROVIDER_SITE_OTHER): Payer: Medicare HMO | Admitting: *Deleted

## 2017-03-27 ENCOUNTER — Encounter: Payer: Self-pay | Admitting: Physician Assistant

## 2017-03-27 ENCOUNTER — Ambulatory Visit: Payer: Medicare HMO | Admitting: Physician Assistant

## 2017-03-27 VITALS — BP 140/90 | HR 62 | Ht 66.0 in | Wt 196.4 lb

## 2017-03-27 DIAGNOSIS — I4891 Unspecified atrial fibrillation: Secondary | ICD-10-CM | POA: Diagnosis not present

## 2017-03-27 DIAGNOSIS — Z7901 Long term (current) use of anticoagulants: Secondary | ICD-10-CM

## 2017-03-27 DIAGNOSIS — I10 Essential (primary) hypertension: Secondary | ICD-10-CM

## 2017-03-27 DIAGNOSIS — I2510 Atherosclerotic heart disease of native coronary artery without angina pectoris: Secondary | ICD-10-CM

## 2017-03-27 DIAGNOSIS — Z950 Presence of cardiac pacemaker: Secondary | ICD-10-CM

## 2017-03-27 LAB — POCT INR: INR: 2.2

## 2017-03-27 NOTE — Patient Instructions (Addendum)
Medication Instructions:   Your physician recommends that you continue on your current medications as directed. Please refer to the Current Medication list given to you today.   If you need a refill on your cardiac medications before your next appointment, please call your pharmacy.  Labwork: BMET AND MAG  TODAY    Testing/Procedures:  NONE ORDERED  TODAY    Follow-Up:   Your physician wants you to follow-up in:  [place]  [place] will receive a reminder letter in the mail two months in advance. If you don't receive a letter, please call our office to schedule the follow-up appointment.    Remote monitoring is used to monitor your Pacemaker of ICD from home. This monitoring reduces the number of office visits required to check your device to one time per year. It allows [place] to keep an eye on the functioning of your device to ensure it is working properly. You are scheduled for a device check from home on . 05-21-17..You may send your transmission at any time that day. If you have a wireless device, the transmission will be sent automatically. After your physician reviews your transmission, you will receive a postcard with your next transmission date.     Any Other Special Instructions Will Be Listed Below (If Applicable).

## 2017-03-27 NOTE — Patient Instructions (Signed)
Description   Continue taking 1/2 tablet everyday. Recheck in 6 weeks. [facility]in [facility] [phone]

## 2017-03-28 LAB — BASIC METABOLIC PANEL
BUN / CREAT RATIO: 14 (ref 10–24)
BUN: 15 mg/dL (ref 8–27)
CO2: 23 mmol/L (ref 20–29)
Calcium: 9.2 mg/dL (ref 8.6–10.2)
Chloride: 100 mmol/L (ref 96–106)
Creatinine, Ser: 1.04 mg/dL (ref 0.76–1.27)
GFR calc Af Amer: 81 mL/min/{1.73_m2} (ref >59)
GFR calc non Af Amer: 70 mL/min/{1.73_m2} (ref >59)
GLUCOSE: 83 mg/dL (ref 65–99)
Potassium: 4.7 mmol/L (ref 3.5–5.2)
SODIUM: 140 mmol/L (ref 134–144)

## 2017-03-28 LAB — MAGNESIUM: MAGNESIUM: 2 mg/dL (ref 1.6–2.3)

## 2017-04-03 ENCOUNTER — Other Ambulatory Visit: Payer: Self-pay

## 2017-04-03 MED ORDER — CARVEDILOL 3.125 MG PO TABS: 3.1250 mg | ORAL_TABLET | Freq: Two times a day (BID) | ORAL | 3 refills | Status: DC

## 2017-04-03 MED ORDER — SOTALOL HCL 120 MG PO TABS: 120.0000 mg | ORAL_TABLET | Freq: Two times a day (BID) | ORAL | 3 refills | Status: DC

## 2017-04-05 ENCOUNTER — Other Ambulatory Visit: Payer: Self-pay | Admitting: *Deleted

## 2017-04-05 MED ORDER — WARFARIN SODIUM 5 MG PO TABS: ORAL_TABLET | ORAL | 1 refills | Status: DC

## 2017-04-12 ENCOUNTER — Ambulatory Visit: Payer: Medicare HMO | Admitting: Cardiovascular Disease

## 2017-04-12 ENCOUNTER — Other Ambulatory Visit: Payer: Self-pay | Admitting: Cardiology

## 2017-04-12 ENCOUNTER — Encounter: Payer: Self-pay | Admitting: Cardiovascular Disease

## 2017-04-12 VITALS — BP 134/78 | HR 71 | Ht 66.0 in | Wt 196.0 lb

## 2017-04-12 DIAGNOSIS — I480 Paroxysmal atrial fibrillation: Secondary | ICD-10-CM

## 2017-04-12 DIAGNOSIS — I10 Essential (primary) hypertension: Secondary | ICD-10-CM

## 2017-04-12 DIAGNOSIS — I25119 Atherosclerotic heart disease of native coronary artery with unspecified angina pectoris: Secondary | ICD-10-CM | POA: Diagnosis not present

## 2017-04-12 NOTE — Progress Notes (Signed)
Chief Complaint  Patient presents with  . Follow-up    CAD   History of Present Illness: 77 yo male with history of CAD s/p 5V CABG 2005, paroxysmal atrial fibrillation/flutter, HTN, HLD, sinus node dysfunction s/p pacemaker implantation who is here today for cardiac follow up. Atrial flutter diagnosed in 2012. He has been followed by Dr. Caryl Comes. He saw Dr. Caryl Comes in April 2013 and was started on dofetilide for atrial fibrillation. He had post-termination pauses and had a pacemaker implanted in April 2013. He has since had Tikosyn stopped due to cost and was started on sotalol. He was switched from Xarelto to coumadin due to cost. Last echo in September 2014 with LVEF=55-60%, mild LVH, no significant valve disease. Stress myoview 09/01/13 with no ischemia. He was seen in our office May 2016 by Richardson Dopp, PA-C with c/o chest pain. Stress myoview was ordered but was cancelled by patient due to cost. I saw him in January 2017 and he c/o daily sharp chest pains. Imdur was added. I saw him in August 2017 and he continue to have chest pain several days per week. Cardiac cath 09/22/15 with 3 patent grafts but could not visualize the other two grafts. The RCA was occluded proximally with no graft to this vessel. We discussed a cardiac CTA but he refused due to claustrophobia. Ranexa added to his regimen in December 2017.   He is here today for follow up. The patient denies any chest pain, dyspnea, palpitations, lower extremity edema, orthopnea, PND, dizziness, near syncope or syncope.   Primary Care Physician: Verdell Carmine., MD   Past Medical History:  Diagnosis Date  . Angina   . Arthritis    "in my back"  . Atrial fibrillation/flutter   . BPH (benign prostatic hyperplasia)   . CAD (coronary artery disease) Dec 2005   Hx MI. 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 Central obesity   . Chronic back pain   . Gout    "once; I have it under  control w/medicine"  . Hyperlipidemia   . Hypertension   . Pacemaker   . Sinus node dysfunction (HCC)    post termination pauses <5sec    Past Surgical History:  Procedure Laterality Date  . CARDIAC CATHETERIZATION  2005   "that what sent me to CABG"  . CARDIAC CATHETERIZATION N/A 09/22/2015   Procedure: Left Heart Cath and Cors/Grafts Angiography;  Surgeon: Burnell Blanks, MD;  Location: [facility];  Service: Cardiovascular;  Laterality: N/A;  . CATARACT EXTRACTION W/ INTRAOCULAR LENS  IMPLANT, BILATERAL  ~ 01/2011  . CHOLECYSTECTOMY  1978   . CORONARY ARTERY BYPASS GRAFT  2005   in [place] - LIMA to LAD, SVG to diagonal, SVG to ramus, SVG to OM and SVG to RCA   . PACEMAKER INSERTION  2014  . PERMANENT PACEMAKER INSERTION N/A 05/25/2011   Procedure: PERMANENT PACEMAKER INSERTION;  Surgeon: [name], MD;  Location: [facility] CATH [facility];  Service: Cardiovascular;  Laterality: N/A;  . TRANSURETHRAL RESECTION OF PROSTATE N/A 12/29/2013   Procedure: TRANSURETHRAL RESECTION OF THE PROSTATE WITH GYRUS INSTRUMENTS;  Surgeon: [name], MD;  Location: [facility];  Service: Urology;  Laterality: N/A;    Current Outpatient Medications  Medication Sig Dispense Refill  . allopurinol (ZYLOPRIM) 300 MG tablet Take 300 mg by mouth every morning.     Marland Kitchen atorvastatin (LIPITOR) 40 MG tablet Take  1 tablet (40 mg total) by mouth every evening. 90 tablet 1  . carvedilol (COREG) 3.125 MG tablet Take 1 tablet (3.125 mg total) by mouth 2 (two) times daily with a meal. 180 tablet 3  . Cholecalciferol (VITAMIN D PO) Take 5,000 Units by mouth daily.    . isosorbide mononitrate (IMDUR) 30 MG 24 hr tablet TAKE 1 TABLET TWICE DAILY 180 tablet 1  . lisinopril (PRINIVIL,ZESTRIL) 20 MG tablet Take 20 mg by mouth at bedtime.     Marland Kitchen loratadine (CLARITIN) 10 MG tablet Take 10 mg by mouth every other day.    . nitroGLYCERIN (NITROSTAT) 0.4 MG SL tablet DISSOLVE ONE TABLET UNDER THE TONGUE EVERY 5 MINUTES  AS NEEDED FOR CHEST PAIN.  DO NOT EXCEED A TOTAL OF 3 DOSES IN 15 MINUTES 25 tablet 5  . Omega-3 Fatty Acids (FISH OIL PO) Take 1 capsule by mouth daily.    . ranolazine (RANEXA) 500 MG 12 hr tablet Take 1 tablet (500 mg total) by mouth 2 (two) times daily. 180 tablet 2  . sotalol (BETAPACE) 120 MG tablet Take 1 tablet (120 mg total) by mouth every 12 (twelve) hours. 180 tablet 3  . warfarin (COUMADIN) 5 MG tablet TAKE 1 TABLET AS DIRECTED OR AS DIRECTED BY THE COUMADIN CLINIC 90 tablet 1   No current facility-administered medications for this visit.     Allergies  Allergen Reactions  . Sulfa Antibiotics Other (See Comments)    SWELLING ON PATIENTS LIPS, HE GETS DRIED UP    Social History   Socioeconomic History  . Marital status: Married    Spouse name: Not on file  . Number of children: 2  . Years of education: Not on file  . Highest education level: Not on file  Social Needs  . Financial resource strain: Not on file  . Food insecurity - worry: Not on file  . Food insecurity - inability: Not on file  . Transportation needs - medical: Not on file  . Transportation needs - non-medical: Not on file  Occupational History  . Occupation: retired-truck Education administrator: RETIRED  Tobacco Use  . Smoking status: Former Smoker    Packs/day: 0.00    Years: 0.00    Pack years: 0.00    Types: Cigarettes    Last attempt to quit: 01/30/1961    Years since quitting: 56.2  . Smokeless tobacco: Never Used  Substance and Sexual Activity  . Alcohol use: No    Comment: 2 drinks (bourbon) a night  . Drug use: No  . Sexual activity: Yes  Other Topics Concern  . Not on file  Social History Narrative   Married. Pt works part-time for a [employer]. Quit tobacco 50 years ago..Both parents are deceased, neither one had cardiac issues and no siblings have cardiac issues.     Family History  Problem Relation Age of Onset  . Cancer Mother   . Stroke Brother   . Heart attack Neg Hx      Review of Systems:  As stated in the HPI and otherwise negative.   BP 134/78   Pulse 71   Ht 5\' 6"  (1.676 m)   Wt 196 lb (88.9 kg)   SpO2 97%   BMI 31.64 kg/m   Physical Examination:  General: Well developed, well nourished, NAD  HEENT: OP clear, mucus membranes moist  SKIN: warm, dry. No rashes. Neuro: No focal deficits  Musculoskeletal: Muscle strength 5/5 all ext  Psychiatric: Mood  and affect normal  Neck: No JVD, no carotid bruits, no thyromegaly, no lymphadenopathy.  Lungs:Clear bilaterally, no wheezes, rhonci, crackles Cardiovascular: Regular rate and rhythm. No murmurs, gallops or rubs. Abdomen:Soft. Bowel sounds present. Non-tender.  Extremities: No lower extremity edema. Pulses are 2 + in the bilateral DP/PT.  Echo 09/3012: Left ventricle: The cavity size was normal. Wall thickness was increased in a pattern of mild LVH. Systolic function was normal. The estimated ejection fraction was in the range of 55% to 60%. Wall motion was normal; there were no regional wall motion abnormalities. Doppler parameters are consistent with abnormal left ventricular relaxation (grade 1 diastolic dysfunction).  EKG:  EKG is not ordered today. The ekg ordered today demonstrates   Recent Labs: 09/08/2016: Hemoglobin 14.1; Platelets 220 03/27/2017: BUN 15; Creatinine, Ser 1.04; Magnesium 2.0; Potassium 4.7; Sodium 140   Lipid Panel Followed in primary care   Wt Readings from Last 3 Encounters:  04/12/17 196 lb (88.9 kg)  03/27/17 196 lb 6.4 oz (89.1 kg)  09/08/16 193 lb 4 oz (87.7 kg)     Other studies Reviewed: Additional studies/ records that were reviewed today include: . Review of the above records demonstrates:   Assessment and Plan:   1. CAD with angina: His angina is stable on Imdur and Ranexa. Will continue statin, beta blocker, Imdur and Ranexa. No ASA since he is on coumadin. Last cardiac cath in August 2017 but I could not find two of his grafts. He has refused  coronary CTA to better define his graft anatomy. He is claustrophobic. Since he is doing well, no further testing at this time.   2. Atrial fibrillation, paroxysmal: No palpitations. He is on coumadin and sotalol and followed in EP clinic. Pacemaker in place.    3. HTN: BP controlled. No changes.   4. HLD: Lipids followed in primary care.   Current medicines are reviewed at length with the patient today.  The patient does not have concerns regarding medicines.  The following changes have been made:  no change  Labs/ tests ordered today include:   No orders of the defined types were placed in this encounter.   Disposition:   FU with me in 6 months  Signed, Lauree Chandler, MD 04/12/2017 2:10 PM    [facility] [place] [place], [place]  [address] Phone: [phone]; Fax: [phone]

## 2017-04-12 NOTE — Patient Instructions (Signed)

## 2017-04-13 ENCOUNTER — Other Ambulatory Visit: Payer: Self-pay | Admitting: *Deleted

## 2017-04-18 ENCOUNTER — Encounter: Payer: Self-pay | Admitting: Gastroenterology

## 2017-05-08 ENCOUNTER — Ambulatory Visit (INDEPENDENT_AMBULATORY_CARE_PROVIDER_SITE_OTHER): Payer: Medicare HMO | Admitting: *Deleted

## 2017-05-08 DIAGNOSIS — I4891 Unspecified atrial fibrillation: Secondary | ICD-10-CM

## 2017-05-08 DIAGNOSIS — Z7901 Long term (current) use of anticoagulants: Secondary | ICD-10-CM | POA: Diagnosis not present

## 2017-05-08 DIAGNOSIS — I480 Paroxysmal atrial fibrillation: Secondary | ICD-10-CM | POA: Diagnosis not present

## 2017-05-08 LAB — POCT INR: INR: 2.8

## 2017-05-08 NOTE — Patient Instructions (Signed)
Description   Continue taking 1/2 tablet everyday. Recheck in 6 weeks. Coumadin Clinic [phone]

## 2017-05-21 ENCOUNTER — Telehealth: Payer: Self-pay | Admitting: Cardiology

## 2017-05-21 ENCOUNTER — Ambulatory Visit (INDEPENDENT_AMBULATORY_CARE_PROVIDER_SITE_OTHER): Payer: Medicare HMO | Admitting: *Deleted

## 2017-05-21 DIAGNOSIS — I495 Sick sinus syndrome: Secondary | ICD-10-CM | POA: Diagnosis not present

## 2017-05-21 NOTE — Telephone Encounter (Signed)
LMOVM reminding pt to send remote transmission.   

## 2017-05-22 ENCOUNTER — Encounter: Payer: Self-pay | Admitting: Cardiology

## 2017-05-22 LAB — CUP PACEART REMOTE DEVICE CHECK
Implantable Lead Implant Date: 20130425
Implantable Lead Location: 753859
MDC IDC LEAD IMPLANT DT: 20130425
MDC IDC LEAD LOCATION: 753860
MDC IDC PG IMPLANT DT: 20130425
MDC IDC SESS DTM: 20190423142035
Pulse Gen Serial Number: 7322329

## 2017-05-22 NOTE — Progress Notes (Signed)
Remote pacemaker transmission.   

## 2017-06-15 ENCOUNTER — Ambulatory Visit (INDEPENDENT_AMBULATORY_CARE_PROVIDER_SITE_OTHER): Payer: Medicare HMO | Admitting: Pharmacist

## 2017-06-15 ENCOUNTER — Encounter (INDEPENDENT_AMBULATORY_CARE_PROVIDER_SITE_OTHER): Payer: Self-pay

## 2017-06-15 DIAGNOSIS — Z7901 Long term (current) use of anticoagulants: Secondary | ICD-10-CM | POA: Diagnosis not present

## 2017-06-15 DIAGNOSIS — I4891 Unspecified atrial fibrillation: Secondary | ICD-10-CM

## 2017-06-15 LAB — POCT INR: INR: 2.2

## 2017-08-08 ENCOUNTER — Encounter (INDEPENDENT_AMBULATORY_CARE_PROVIDER_SITE_OTHER): Payer: Self-pay

## 2017-08-08 ENCOUNTER — Ambulatory Visit (INDEPENDENT_AMBULATORY_CARE_PROVIDER_SITE_OTHER): Payer: Medicare HMO | Admitting: Pharmacist

## 2017-08-08 DIAGNOSIS — Z7901 Long term (current) use of anticoagulants: Secondary | ICD-10-CM

## 2017-08-08 DIAGNOSIS — I4891 Unspecified atrial fibrillation: Secondary | ICD-10-CM | POA: Diagnosis not present

## 2017-08-08 LAB — POCT INR: INR: 1.7 — AB (ref 2.0–3.0)

## 2017-08-08 NOTE — Patient Instructions (Signed)
Description   Take 1 tablet today and tomorrow, then continue taking 1/2 tablet everyday. Recheck in 4 weeks. Coumadin Clinic [phone]

## 2017-08-20 ENCOUNTER — Ambulatory Visit (INDEPENDENT_AMBULATORY_CARE_PROVIDER_SITE_OTHER): Payer: Medicare HMO | Admitting: *Deleted

## 2017-08-20 DIAGNOSIS — I495 Sick sinus syndrome: Secondary | ICD-10-CM

## 2017-08-21 ENCOUNTER — Encounter: Payer: Self-pay | Admitting: Cardiology

## 2017-08-21 NOTE — Progress Notes (Signed)
Remote pacemaker transmission.   

## 2017-09-10 ENCOUNTER — Ambulatory Visit (INDEPENDENT_AMBULATORY_CARE_PROVIDER_SITE_OTHER): Payer: Medicare HMO

## 2017-09-10 ENCOUNTER — Other Ambulatory Visit: Payer: Self-pay | Admitting: Cardiovascular Disease

## 2017-09-10 DIAGNOSIS — I4891 Unspecified atrial fibrillation: Secondary | ICD-10-CM

## 2017-09-10 DIAGNOSIS — Z7901 Long term (current) use of anticoagulants: Secondary | ICD-10-CM

## 2017-09-10 LAB — POCT INR: INR: 1.3 — AB (ref 2.0–3.0)

## 2017-09-10 NOTE — Patient Instructions (Addendum)
Description   Since you took an extra 1/2 tablet (1 whole tablet) yesterday, take 1 tablet today, then start taking 1/2 tablet daily except 1 tablet on Fridays. Recheck in 10 days. Coumadin Clinic [phone]

## 2017-09-19 ENCOUNTER — Ambulatory Visit (INDEPENDENT_AMBULATORY_CARE_PROVIDER_SITE_OTHER): Payer: Medicare HMO

## 2017-09-19 DIAGNOSIS — Z7901 Long term (current) use of anticoagulants: Secondary | ICD-10-CM

## 2017-09-19 DIAGNOSIS — I4891 Unspecified atrial fibrillation: Secondary | ICD-10-CM | POA: Diagnosis not present

## 2017-09-19 LAB — CUP PACEART REMOTE DEVICE CHECK
Battery Remaining Longevity: 74 mo
Brady Statistic AP VP Percent: 60 %
Brady Statistic AS VP Percent: 1 %
Brady Statistic AS VS Percent: 3.4 %
Brady Statistic RA Percent Paced: 94 %
Implantable Lead Implant Date: 20130425
Implantable Lead Location: 753859
Implantable Lead Location: 753860
Lead Channel Impedance Value: 410 Ohm
Lead Channel Impedance Value: 490 Ohm
Lead Channel Pacing Threshold Amplitude: 0.5 V
Lead Channel Pacing Threshold Pulse Width: 0.4 ms
Lead Channel Pacing Threshold Pulse Width: 0.4 ms
Lead Channel Sensing Intrinsic Amplitude: 4.8 mV
Lead Channel Setting Pacing Amplitude: 1.125
Lead Channel Setting Sensing Sensitivity: 2 mV
MDC IDC LEAD IMPLANT DT: 20130425
MDC IDC MSMT BATTERY REMAINING PERCENTAGE: 57 %
MDC IDC MSMT BATTERY VOLTAGE: 2.89 V
MDC IDC MSMT LEADCHNL RA SENSING INTR AMPL: 2.6 mV
MDC IDC MSMT LEADCHNL RV PACING THRESHOLD AMPLITUDE: 0.875 V
MDC IDC PG IMPLANT DT: 20130425
MDC IDC SESS DTM: 20190722195158
MDC IDC SET LEADCHNL RA PACING AMPLITUDE: 1.5 V
MDC IDC SET LEADCHNL RV PACING PULSEWIDTH: 0.4 ms
MDC IDC STAT BRADY AP VS PERCENT: 36 %
MDC IDC STAT BRADY RV PERCENT PACED: 61 %
Pulse Gen Model: 2110
Pulse Gen Serial Number: 7322329

## 2017-09-19 LAB — POCT INR: INR: 3.1 — AB (ref 2.0–3.0)

## 2017-09-19 NOTE — Patient Instructions (Signed)
Description   Start taking 1/2 tablet daily except 1 tablet on Mondays. Recheck in 3 weeks. Coumadin Clinic [phone]

## 2017-09-25 ENCOUNTER — Encounter: Payer: Self-pay | Admitting: Internal Medicine

## 2017-09-25 ENCOUNTER — Ambulatory Visit: Payer: Medicare HMO | Admitting: Internal Medicine

## 2017-09-25 VITALS — BP 132/82 | HR 66 | Ht 65.0 in | Wt 193.6 lb

## 2017-09-25 DIAGNOSIS — Z950 Presence of cardiac pacemaker: Secondary | ICD-10-CM | POA: Diagnosis not present

## 2017-09-25 DIAGNOSIS — Z7901 Long term (current) use of anticoagulants: Secondary | ICD-10-CM | POA: Diagnosis not present

## 2017-09-25 DIAGNOSIS — I495 Sick sinus syndrome: Secondary | ICD-10-CM | POA: Diagnosis not present

## 2017-09-25 DIAGNOSIS — I4891 Unspecified atrial fibrillation: Secondary | ICD-10-CM

## 2017-09-25 NOTE — Patient Instructions (Signed)
Medication Instructions:  Your physician recommends that you continue on your current medications as directed. Please refer to the Current Medication list given to you today.  Labwork: You will have labs drawn today: CBC, BMP, Mg   Testing/Procedures: None ordered.  Follow-Up: Your physician wants you to follow-up in: One Year with Dr Gari Crown will receive a reminder letter in the mail two months in advance. If you don't receive a letter, please call our office to schedule the follow-up appointment.   Any Other Special Instructions Will Be Listed Below (If Applicable).     If you need a refill on your cardiac medications before your next appointment, please call your pharmacy.

## 2017-09-25 NOTE — Progress Notes (Signed)
Patient Care Team: Spry, Marsh Dolly., MD as PCP - General (Family Medicine) Burnell Blanks, MD as PCP - Cardiology (Cardiology)   HPI  Dylan Roth is a 77 y.o. male Seen in followup for atrial fibrillation with  posttermination pauses for which he underwent pacemaker implantation 4/13. He has been treated with sotalol and Rivaroxaban; he ended up back on warfarin because of bleeding issues  He has a history of ischemic heart disease with prior bypass surgery 2005. Ejection fraction by echo 9/14 was normal Stress myoview 7/15 with no ischemia  DATE TEST EF   9/14 Echo  55-65   8/17 LHC   % 3/5 grafts patent           Continues with ongoing vague chest pains he had undergone catheterization last by Dr. Elder Love 17 as above..  These are occasionally exertional sometimes associated with stress lasting a minute or so or less  Chronic mild dyspnea no edema  No bleeding issues no palpitations  Mildly elevated PSA Date Cr K Mg Hgb  8/17  0.94 4.4 1.9 14.2   12/17      15.2  2/19 1.04 4.7 2.0           Past Medical History:  Diagnosis Date  . Angina   . Arthritis    "in my back"  . Atrial fibrillation/flutter   . BPH (benign prostatic hyperplasia)   . CAD (coronary artery disease) Dec 2005   Hx MI. 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 Central obesity   . Chronic back pain   . Gout    "once; I have it under control w/medicine"  . Hyperlipidemia   . Hypertension   . Pacemaker   . Sinus node dysfunction (HCC)    post termination pauses <5sec    Past Surgical History:  Procedure Laterality Date  . CARDIAC CATHETERIZATION  2005   "that what sent me to CABG"  . CARDIAC CATHETERIZATION N/A 09/22/2015   Procedure: Left Heart Cath and Cors/Grafts Angiography;  Surgeon: Burnell Blanks, MD;  Location: [place] [facility];  Service: Cardiovascular;  Laterality: N/A;  . CATARACT EXTRACTION W/ INTRAOCULAR  LENS  IMPLANT, BILATERAL  ~ 01/2011  . CHOLECYSTECTOMY  1978   . CORONARY ARTERY BYPASS GRAFT  2005   in [place] - LIMA to LAD, SVG to diagonal, SVG to ramus, SVG to OM and SVG to RCA   . PACEMAKER INSERTION  2014  . PERMANENT PACEMAKER INSERTION N/A 05/25/2011   Procedure: PERMANENT PACEMAKER INSERTION;  Surgeon: [name], MD;  Location[facility] C[facility] LAB;  Service: Cardiovascular;  Laterality: N/A;  . TRANSURETHRAL RESECTION OF PROSTATE N/A 12/29/2013   Procedure: TRANSURETHRAL RESECTION OF THE PROSTATE WITH GYRUS INSTRUMENTS;  Surgeon: [name], MD;  Location: [facility];  Service: Urology;  Laterality: N/A;    Current Outpatient Medications  Medication Sig Dispense Refill  . allopurinol (ZYLOPRIM) 300 MG tablet Take 300 mg by mouth every morning.     Marland Kitchen atorvastatin (LIPITOR) 40 MG tablet Take 1 tablet (40 mg total) by mouth every evening. 90 tablet 1  . carvedilol (COREG) 3.125 MG tablet Take 1 tablet (3.125 mg total) by mouth 2 (two) times daily with a meal. 180 tablet 3  . Cholecalciferol (VITAMIN D PO) Take 5,000 Units by mouth daily.    . isosorbide mononitrate (IMDUR) 30 MG 24 hr tablet Take 1 tablet (30  mg total) by mouth 2 (two) times daily. 180 tablet 3  . lisinopril (PRINIVIL,ZESTRIL) 20 MG tablet Take 20 mg by mouth at bedtime.     Marland Kitchen loratadine (CLARITIN) 10 MG tablet Take 10 mg by mouth every other day.    . nitroGLYCERIN (NITROSTAT) 0.4 MG SL tablet DISSOLVE ONE TABLET UNDER THE TONGUE EVERY 5 MINUTES AS NEEDED FOR CHEST PAIN.  DO NOT EXCEED A TOTAL OF 3 DOSES IN 15 MINUTES 25 tablet 5  . Omega-3 Fatty Acids (FISH OIL PO) Take 1 capsule by mouth daily.    . ranolazine (RANEXA) 500 MG 12 hr tablet TAKE 1 TABLET TWICE DAILY 180 tablet 2  . sotalol (BETAPACE) 120 MG tablet Take 1 tablet (120 mg total) by mouth every 12 (twelve) hours. 180 tablet 3  . warfarin (COUMADIN) 5 MG tablet TAKE 1 TABLET AS DIRECTED OR AS DIRECTED BY THE COUMADIN CLINIC 90 tablet 1  . fluticasone  (FLONASE) 50 MCG/ACT nasal spray Place 1 spray into both nostrils as needed for allergies.     No current facility-administered medications for this visit.     Allergies  Allergen Reactions  . Sulfa Antibiotics Other (See Comments)    SWELLING ON PATIENTS LIPS, HE GETS DRIED UP    Review of Systems negative except from HPI and PMH  Physical Exam BP 132/82   Pulse 66   Ht 5\' 5"  (1.651 m)   Wt 193 lb 9.6 oz (87.8 kg)   SpO2 98%   BMI 32.22 kg/m  Well developed and nourished in no acute distress HENT normal Neck supple with JVP-flat Clear Regular rate and rhythm, no murmurs or gallops Abd-soft with active BS No Clubbing cyanosis edema Skin-warm and dry A & Oriented  Grossly normal sensory and motor function Device pocket well healed; without hematoma or erythema.  There is no tethering   ECG: Apacing @ 66 with intermittent conduction  Sounds 24/15/47     Assessment and  Plan  Ischemic heart disease with prior bypass surgery  Exertional dyspnea-atypical  Sinus node dysfunction and chronotropic incompetence  Pacemaker-St. Jude The patient's device was interrogated and the information was fully reviewed.  The device was reprogrammed to increase AV delay to allow for intrinsic conduction   High Risk Medication Surveillance  AF paroxsymal  AV conduction 1 AVBlock   Infrequent atrial fibrillation  Continue sotalol.  We will check surveillance laboratories.  No obvious bleeding on apixaban.  Continue.  Check CBC  Chronic low-grade intermittent chest pains.  Will defer to Dr. Angelena Form.  Not sure that it is cardiac; is not changed considerably over the last couple years following his catheterization.  Ventricular pacing percentage is 60%.  Try to minimize this we will increase his DIP to 400 ms to hopefully promote intrinsic conduction.

## 2017-09-26 LAB — BASIC METABOLIC PANEL
BUN/Creatinine Ratio: 17 (ref 10–24)
BUN: 18 mg/dL (ref 8–27)
CALCIUM: 9.3 mg/dL (ref 8.6–10.2)
CHLORIDE: 100 mmol/L (ref 96–106)
CO2: 25 mmol/L (ref 20–29)
Creatinine, Ser: 1.07 mg/dL (ref 0.76–1.27)
GFR, EST AFRICAN AMERICAN: 78 mL/min/{1.73_m2} (ref >59)
GFR, EST NON AFRICAN AMERICAN: 68 mL/min/{1.73_m2} (ref >59)
Glucose: 79 mg/dL (ref 65–99)
Potassium: 4.9 mmol/L (ref 3.5–5.2)
Sodium: 139 mmol/L (ref 134–144)

## 2017-09-26 LAB — CBC
HEMATOCRIT: 43 % (ref 37.5–51.0)
HEMOGLOBIN: 14.8 g/dL (ref 13.0–17.7)
MCH: 34.1 pg — ABNORMAL HIGH (ref 26.6–33.0)
MCHC: 34.4 g/dL (ref 31.5–35.7)
MCV: 99 fL — AB (ref 79–97)
Platelets: 207 10*3/uL (ref 150–450)
RBC: 4.34 x10E6/uL (ref 4.14–5.80)
RDW: 14.3 % (ref 12.3–15.4)
WBC: 7.5 10*3/uL (ref 3.4–10.8)

## 2017-09-26 LAB — MAGNESIUM: Magnesium: 2 mg/dL (ref 1.6–2.3)

## 2017-10-02 ENCOUNTER — Telehealth: Payer: Self-pay | Admitting: Internal Medicine

## 2017-10-02 NOTE — Telephone Encounter (Signed)
Follow Up:    Returning your call from today, concerning his lab results. 

## 2017-10-09 ENCOUNTER — Ambulatory Visit (INDEPENDENT_AMBULATORY_CARE_PROVIDER_SITE_OTHER): Payer: Medicare HMO | Admitting: *Deleted

## 2017-10-09 DIAGNOSIS — I4891 Unspecified atrial fibrillation: Secondary | ICD-10-CM | POA: Diagnosis not present

## 2017-10-09 DIAGNOSIS — Z5181 Encounter for therapeutic drug level monitoring: Secondary | ICD-10-CM

## 2017-10-09 LAB — POCT INR: INR: 3 (ref 2.0–3.0)

## 2017-10-09 NOTE — Patient Instructions (Signed)
Description   Continue taking 1/2 tablet daily except 1 tablet on Mondays. Recheck in 4 weeks. Coumadin Clinic [phone]

## 2017-10-15 NOTE — Telephone Encounter (Signed)
Called and left patient a message to call back about labs

## 2017-11-05 ENCOUNTER — Ambulatory Visit (INDEPENDENT_AMBULATORY_CARE_PROVIDER_SITE_OTHER): Payer: Medicare HMO | Admitting: Pharmacist

## 2017-11-05 DIAGNOSIS — I4891 Unspecified atrial fibrillation: Secondary | ICD-10-CM

## 2017-11-05 LAB — POCT INR: INR: 2.5 (ref 2.0–3.0)

## 2017-11-05 NOTE — Patient Instructions (Signed)
Description   Continue taking 1/2 tablet daily except 1 tablet on Mondays. Recheck in 5 weeks. Coumadin Clinic [phone]

## 2017-11-14 ENCOUNTER — Encounter: Payer: Self-pay | Admitting: Cardiovascular Disease

## 2017-11-14 ENCOUNTER — Ambulatory Visit (INDEPENDENT_AMBULATORY_CARE_PROVIDER_SITE_OTHER): Payer: Medicare HMO | Admitting: Cardiovascular Disease

## 2017-11-14 VITALS — BP 136/82 | HR 71 | Ht 65.0 in | Wt 191.2 lb

## 2017-11-14 DIAGNOSIS — I10 Essential (primary) hypertension: Secondary | ICD-10-CM

## 2017-11-14 DIAGNOSIS — I480 Paroxysmal atrial fibrillation: Secondary | ICD-10-CM

## 2017-11-14 DIAGNOSIS — I2510 Atherosclerotic heart disease of native coronary artery without angina pectoris: Secondary | ICD-10-CM | POA: Diagnosis not present

## 2017-11-14 NOTE — Progress Notes (Signed)
Chief Complaint  Patient presents with  . Follow-up    CAD   History of Present Illness: 77 yo male with history of CAD s/p 5V CABG 2005, paroxysmal atrial fibrillation/flutter, HTN, HLD, sinus node dysfunction s/p pacemaker implantation who is here today for cardiac follow up. Atrial flutter diagnosed in 2012. He has been followed by Dr. Caryl Comes. He saw Dr. Caryl Comes in April 2013 and was started on dofetilide for atrial fibrillation. He had post-termination pauses and had a pacemaker implanted in April 2013. He has since had Tikosyn stopped due to cost and was started on sotalol. He was switched from Xarelto to coumadin due to cost. Last echo in September 2014 with LVEF=55-60%, mild LVH, no significant valve disease. Stress myoview 09/01/13 with no ischemia. He was seen in our office May 2016 by Richardson Dopp, PA-C with c/o chest pain. Stress myoview was ordered but was cancelled by patient due to cost. I saw him in January 2017 and he c/o daily sharp chest pains. Imdur was added. I saw him in August 2017 and he continue to have chest pain several days per week. Cardiac cath 09/22/15 with 3 patent grafts but could not visualize the other two grafts. The RCA was occluded proximally with no graft to this vessel. We discussed a cardiac CTA but he refused due to claustrophobia. Ranexa added to his regimen in December 2017.   He is here today for follow up. The patient denies any chest pain, dyspnea, palpitations, lower extremity edema, orthopnea, PND, dizziness, near syncope or syncope.   Primary Care Physician: Verdell Carmine., MD   Past Medical History:  Diagnosis Date  . Angina   . Arthritis    "in my back"  . Atrial fibrillation/flutter   . BPH (benign prostatic hyperplasia)   . CAD (coronary artery disease) Dec 2005   Hx MI. 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 Central obesity   . Chronic back pain   . Gout    "once; I have it under  control w/medicine"  . Hyperlipidemia   . Hypertension   . Pacemaker   . Sinus node dysfunction (HCC)    post termination pauses <5sec    Past Surgical History:  Procedure Laterality Date  . CARDIAC CATHETERIZATION  2005   "that what sent me to CABG"  . CARDIAC CATHETERIZATION N/A 09/22/2015   Procedure: Left Heart Cath and Cors/Grafts Angiography;  Surgeon: Burnell Blanks, MD;  Location: [facility];  Service: Cardiovascular;  Laterality: N/A;  . CATARACT EXTRACTION W/ INTRAOCULAR LENS  IMPLANT, BILATERAL  ~ 01/2011  . CHOLECYSTECTOMY  1978   . CORONARY ARTERY BYPASS GRAFT  2005   in [place] - LIMA to LAD, SVG to diagonal, SVG to ramus, SVG to OM and SVG to RCA   . PACEMAKER INSERTION  2014  . PERMANENT PACEMAKER INSERTION N/A 05/25/2011   Procedure: PERMANENT PACEMAKER INSERTION;  Surgeon: [name], MD;  Location: [facility] CATH LAB;  Service: Cardiovascular;  Laterality: N/A;  . TRANSURETHRAL RESECTION OF PROSTATE N/A 12/29/2013   Procedure: TRANSURETHRAL RESECTION OF THE PROSTATE WITH GYRUS INSTRUMENTS;  Surgeon: [name], MD;  Location: [facility];  Service: Urology;  Laterality: N/A;    Current Outpatient Medications  Medication Sig Dispense Refill  . allopurinol (ZYLOPRIM) 300 MG tablet Take 300 mg by mouth every morning.     Marland Kitchen atorvastatin (LIPITOR) 40 MG tablet Take  1 tablet (40 mg total) by mouth every evening. 90 tablet 1  . carvedilol (COREG) 3.125 MG tablet Take 1 tablet (3.125 mg total) by mouth 2 (two) times daily with a meal. 180 tablet 3  . Cholecalciferol (VITAMIN D PO) Take 5,000 Units by mouth daily.    . fluticasone (FLONASE) 50 MCG/ACT nasal spray Place 1 spray into both nostrils as needed for allergies.    . isosorbide mononitrate (IMDUR) 30 MG 24 hr tablet Take 1 tablet (30 mg total) by mouth 2 (two) times daily. 180 tablet 3  . lisinopril (PRINIVIL,ZESTRIL) 20 MG tablet Take 20 mg by mouth at bedtime.     Marland Kitchen loratadine (CLARITIN) 10 MG  tablet Take 10 mg by mouth every other day.    . nitroGLYCERIN (NITROSTAT) 0.4 MG SL tablet DISSOLVE ONE TABLET UNDER THE TONGUE EVERY 5 MINUTES AS NEEDED FOR CHEST PAIN.  DO NOT EXCEED A TOTAL OF 3 DOSES IN 15 MINUTES 25 tablet 5  . Omega-3 Fatty Acids (FISH OIL PO) Take 1 capsule by mouth daily.    . ranolazine (RANEXA) 500 MG 12 hr tablet TAKE 1 TABLET TWICE DAILY 180 tablet 2  . sotalol (BETAPACE) 120 MG tablet Take 1 tablet (120 mg total) by mouth every 12 (twelve) hours. 180 tablet 3  . warfarin (COUMADIN) 5 MG tablet TAKE 1 TABLET AS DIRECTED OR AS DIRECTED BY THE COUMADIN CLINIC 90 tablet 1   No current facility-administered medications for this visit.     Allergies  Allergen Reactions  . Sulfa Antibiotics Other (See Comments)    SWELLING ON PATIENTS LIPS, HE GETS DRIED UP  . Sulfamethoxazole Other (See Comments)    Unknown  . Sulfamethoxazole-Trimethoprim Other (See Comments)    Unknown    Social History   Socioeconomic History  . Marital status: Married    Spouse name: Not on file  . Number of children: 2  . Years of education: Not on file  . Highest education level: Not on file  Occupational History  . Occupation: retired-truck Education administrator: RETIRED  Social Needs  . Financial resource strain: Not on file  . Food insecurity:    Worry: Not on file    Inability: Not on file  . Transportation needs:    Medical: Not on file    Non-medical: Not on file  Tobacco Use  . Smoking status: Former Smoker    Packs/day: 0.00    Years: 0.00    Pack years: 0.00    Types: Cigarettes    Last attempt to quit: 01/30/1961    Years since quitting: 56.8  . Smokeless tobacco: Never Used  Substance and Sexual Activity  . Alcohol use: No    Comment: 2 drinks (bourbon) a night  . Drug use: No  . Sexual activity: Yes  Lifestyle  . Physical activity:    Days per week: Not on file    Minutes per session: Not on file  . Stress: Not on file  Relationships  . Social  connections:    Talks on phone: Not on file    Gets together: Not on file    Attends religious service: Not on file    Active member of club or organization: Not on file    Attends meetings of clubs or organizations: Not on file    Relationship status: Not on file  . Intimate partner violence:    Fear of current or ex partner: Not on file    Emotionally  abused: Not on file    Physically abused: Not on file    Forced sexual activity: Not on file  Other Topics Concern  . Not on file  Social History Narrative   Married. Pt works part-time for a [employer]. Quit tobacco 50 years ago..Both parents are deceased, neither one had cardiac issues and no siblings have cardiac issues.     Family History  Problem Relation Age of Onset  . Cancer Mother   . Stroke Brother   . Heart attack Neg Hx     Review of Systems:  As stated in the HPI and otherwise negative.   BP 136/82   Pulse 71   Ht 5\' 5"  (1.651 m)   Wt 191 lb 3.2 oz (86.7 kg)   SpO2 98%   BMI 31.82 kg/m   Physical Examination:  General: Well developed, well nourished, NAD  HEENT: OP clear, mucus membranes moist  SKIN: warm, dry. No rashes. Neuro: No focal deficits  Musculoskeletal: Muscle strength 5/5 all ext  Psychiatric: Mood and affect normal  Neck: No JVD, no carotid bruits, no thyromegaly, no lymphadenopathy.  Lungs:Clear bilaterally, no wheezes, rhonci, crackles Cardiovascular: Regular rate and rhythm. No murmurs, gallops or rubs. Abdomen:Soft. Bowel sounds present. Non-tender.  Extremities: No lower extremity edema. Pulses are 2 + in the bilateral DP/PT.  Echo 09/3012: Left ventricle: The cavity size was normal. Wall thickness was increased in a pattern of mild LVH. Systolic function was normal. The estimated ejection fraction was in the range of 55% to 60%. Wall motion was normal; there were no regional wall motion abnormalities. Doppler parameters are consistent with abnormal left ventricular relaxation  (grade 1 diastolic dysfunction).  EKG:  EKG is not ordered today. The ekg ordered today demonstrates   Recent Labs: 09/25/2017: BUN 18; Creatinine, Ser 1.07; Hemoglobin 14.8; Magnesium 2.0; Platelets 207; Potassium 4.9; Sodium 139   Lipid Panel Followed in primary care   Wt Readings from Last 3 Encounters:  11/14/17 191 lb 3.2 oz (86.7 kg)  09/25/17 193 lb 9.6 oz (87.8 kg)  04/12/17 196 lb (88.9 kg)     Other studies Reviewed: Additional studies/ records that were reviewed today include: . Review of the above records demonstrates:   Assessment and Plan:   1. CAD s/p CABG with angina: Last cardiac cath in August 2017 but I could not find two of his grafts. He has refused coronary CTA to better define his graft anatomy. He is claustrophobic.Stable rare chest pains c/w angina. Continue beta blocker, statin, Imdur and Ranexa. He has not been on an ASA since he is on coumadin.   2. Atrial fibrillation, paroxysmal: He has no palpitations. This is followed by Dr. Caryl Comes. Pacemaker in place for post termination pauses/sinus node dysfunction. Will continue coumadin, beta blocker and sotalol.     3. HTN: BP is controlled. No changes.    4. HLD: His lipids are followed in primary care. Continue statin.   Current medicines are reviewed at length with the patient today.  The patient does not have concerns regarding medicines.  The following changes have been made:  no change  Labs/ tests ordered today include:   No orders of the defined types were placed in this encounter.   Disposition:   FU with me in 12 months  Signed, Lauree Chandler, MD 11/14/2017 11:37 AM    [facility] [address], [place], [place]  [address] Phone: [phone]; Fax: [phone]

## 2017-11-14 NOTE — Patient Instructions (Signed)

## 2017-11-19 ENCOUNTER — Ambulatory Visit (INDEPENDENT_AMBULATORY_CARE_PROVIDER_SITE_OTHER): Payer: Medicare HMO | Admitting: *Deleted

## 2017-11-19 DIAGNOSIS — I495 Sick sinus syndrome: Secondary | ICD-10-CM

## 2017-11-19 NOTE — Progress Notes (Signed)
Remote pacemaker transmission.   

## 2017-11-20 ENCOUNTER — Encounter: Payer: Self-pay | Admitting: Cardiology

## 2017-12-09 ENCOUNTER — Encounter

## 2017-12-10 ENCOUNTER — Telehealth: Payer: Self-pay | Admitting: Internal Medicine

## 2017-12-10 NOTE — Telephone Encounter (Signed)
Per pt PM settings had been changed and felt fine but recently has noted fluttering has also complained of cold (coughing) not sure if this is causing the issue has not taken anything OTC for these symptoms Will forward to Dr Caryl Comes and device pool and see if can get a home transmission ./cy

## 2017-12-10 NOTE — Telephone Encounter (Signed)
New message:       Pt is calling and would like to know if it's normal to sometimes have some palpitations here and there with his device in?

## 2017-12-10 NOTE — Telephone Encounter (Signed)
Known AF. Requested manual transmission- patient agreeable.  August OV with SK- extended AV delays to allow for intrinsic conduction.

## 2017-12-10 NOTE — Telephone Encounter (Signed)
Transmission reviewed. 27% V pacing (decreased from 61% in August 2019). <1% AT/AF, longest 4hrs 66min on 11/15/17.

## 2017-12-14 ENCOUNTER — Encounter (INDEPENDENT_AMBULATORY_CARE_PROVIDER_SITE_OTHER): Payer: Self-pay

## 2017-12-14 ENCOUNTER — Ambulatory Visit (INDEPENDENT_AMBULATORY_CARE_PROVIDER_SITE_OTHER): Payer: Medicare HMO | Admitting: Pharmacist

## 2017-12-14 DIAGNOSIS — I4891 Unspecified atrial fibrillation: Secondary | ICD-10-CM

## 2017-12-14 LAB — POCT INR: INR: 2.7 (ref 2.0–3.0)

## 2017-12-14 NOTE — Patient Instructions (Signed)
Description   Continue taking 1/2 tablet daily except 1 tablet on Mondays. Recheck in 6 weeks. [facility] [phone]

## 2017-12-19 LAB — CUP PACEART INCLINIC DEVICE CHECK
Date Time Interrogation Session: 20191120160634
Implantable Lead Implant Date: 20130425
Implantable Lead Location: 753859
Implantable Lead Location: 753860
MDC IDC LEAD IMPLANT DT: 20130425
MDC IDC PG IMPLANT DT: 20130425
Pulse Gen Serial Number: 7322329

## 2018-01-20 LAB — CUP PACEART REMOTE DEVICE CHECK
Battery Remaining Percentage: 57 %
Battery Voltage: 2.89 V
Brady Statistic AP VP Percent: 28 %
Brady Statistic RV Percent Paced: 30 %
Date Time Interrogation Session: 20191021133644
Implantable Lead Implant Date: 20130425
Implantable Pulse Generator Implant Date: 20130425
Lead Channel Impedance Value: 410 Ohm
Lead Channel Pacing Threshold Amplitude: 0.875 V
Lead Channel Sensing Intrinsic Amplitude: 2.8 mV
Lead Channel Setting Pacing Pulse Width: 0.4 ms
Lead Channel Setting Sensing Sensitivity: 2 mV
MDC IDC LEAD IMPLANT DT: 20130425
MDC IDC LEAD LOCATION: 753859
MDC IDC LEAD LOCATION: 753860
MDC IDC MSMT BATTERY REMAINING LONGEVITY: 76 mo
MDC IDC MSMT LEADCHNL RA IMPEDANCE VALUE: 460 Ohm
MDC IDC MSMT LEADCHNL RA PACING THRESHOLD AMPLITUDE: 0.5 V
MDC IDC MSMT LEADCHNL RA PACING THRESHOLD PULSEWIDTH: 0.4 ms
MDC IDC MSMT LEADCHNL RV PACING THRESHOLD PULSEWIDTH: 0.4 ms
MDC IDC MSMT LEADCHNL RV SENSING INTR AMPL: 4.9 mV
MDC IDC PG SERIAL: 7322329
MDC IDC SET LEADCHNL RA PACING AMPLITUDE: 1.5 V
MDC IDC SET LEADCHNL RV PACING AMPLITUDE: 1.125
MDC IDC STAT BRADY AP VS PERCENT: 69 %
MDC IDC STAT BRADY AS VP PERCENT: 1 %
MDC IDC STAT BRADY AS VS PERCENT: 2 %
MDC IDC STAT BRADY RA PERCENT PACED: 96 %
Pulse Gen Model: 2110

## 2018-01-25 ENCOUNTER — Encounter (INDEPENDENT_AMBULATORY_CARE_PROVIDER_SITE_OTHER): Payer: Self-pay

## 2018-01-25 ENCOUNTER — Ambulatory Visit (INDEPENDENT_AMBULATORY_CARE_PROVIDER_SITE_OTHER): Payer: Medicare HMO | Admitting: *Deleted

## 2018-01-25 DIAGNOSIS — I4891 Unspecified atrial fibrillation: Secondary | ICD-10-CM

## 2018-01-25 DIAGNOSIS — Z5181 Encounter for therapeutic drug level monitoring: Secondary | ICD-10-CM

## 2018-01-25 LAB — POCT INR: INR: 3.6 — AB (ref 2.0–3.0)

## 2018-01-25 NOTE — Patient Instructions (Signed)
Description   Do not take any Coumadin today and then Continue taking 1/2 tablet daily except 1 tablet on Mondays. Recheck in 4 weeks. [facility] [phone]

## 2018-02-10 ENCOUNTER — Encounter (HOSPITAL_COMMUNITY): Payer: Self-pay | Admitting: Emergency Medicine

## 2018-02-10 ENCOUNTER — Other Ambulatory Visit: Payer: Self-pay

## 2018-02-10 ENCOUNTER — Emergency Department (HOSPITAL_COMMUNITY): Payer: Medicare HMO

## 2018-02-10 ENCOUNTER — Observation Stay (HOSPITAL_COMMUNITY)
Admission: EM | Admit: 2018-02-10 | Discharge: 2018-02-11 | Disposition: A | Discharge status: Home / Self Care | Payer: Medicare HMO | Attending: Internal Medicine | Admitting: Internal Medicine

## 2018-02-10 DIAGNOSIS — Z79899 Other long term (current) drug therapy: Secondary | ICD-10-CM | POA: Diagnosis not present

## 2018-02-10 DIAGNOSIS — R079 Chest pain, unspecified: Principal | ICD-10-CM | POA: Diagnosis present

## 2018-02-10 DIAGNOSIS — I252 Old myocardial infarction: Secondary | ICD-10-CM | POA: Insufficient documentation

## 2018-02-10 DIAGNOSIS — Z7901 Long term (current) use of anticoagulants: Secondary | ICD-10-CM | POA: Insufficient documentation

## 2018-02-10 DIAGNOSIS — I2510 Atherosclerotic heart disease of native coronary artery without angina pectoris: Secondary | ICD-10-CM | POA: Diagnosis not present

## 2018-02-10 DIAGNOSIS — Z951 Presence of aortocoronary bypass graft: Secondary | ICD-10-CM | POA: Diagnosis not present

## 2018-02-10 DIAGNOSIS — M109 Gout, unspecified: Secondary | ICD-10-CM | POA: Diagnosis not present

## 2018-02-10 DIAGNOSIS — Z882 Allergy status to sulfonamides status: Secondary | ICD-10-CM | POA: Insufficient documentation

## 2018-02-10 DIAGNOSIS — Z87891 Personal history of nicotine dependence: Secondary | ICD-10-CM | POA: Insufficient documentation

## 2018-02-10 DIAGNOSIS — I10 Essential (primary) hypertension: Secondary | ICD-10-CM | POA: Diagnosis present

## 2018-02-10 DIAGNOSIS — I4891 Unspecified atrial fibrillation: Secondary | ICD-10-CM | POA: Diagnosis present

## 2018-02-10 DIAGNOSIS — Z950 Presence of cardiac pacemaker: Secondary | ICD-10-CM | POA: Diagnosis not present

## 2018-02-10 DIAGNOSIS — R0602 Shortness of breath: Secondary | ICD-10-CM | POA: Diagnosis present

## 2018-02-10 DIAGNOSIS — M6281 Muscle weakness (generalized): Secondary | ICD-10-CM | POA: Insufficient documentation

## 2018-02-10 DIAGNOSIS — E7800 Pure hypercholesterolemia, unspecified: Secondary | ICD-10-CM | POA: Diagnosis not present

## 2018-02-10 DIAGNOSIS — I200 Unstable angina: Secondary | ICD-10-CM

## 2018-02-10 DIAGNOSIS — E875 Hyperkalemia: Secondary | ICD-10-CM | POA: Diagnosis not present

## 2018-02-10 DIAGNOSIS — R2689 Other abnormalities of gait and mobility: Secondary | ICD-10-CM | POA: Insufficient documentation

## 2018-02-10 LAB — CBC WITH DIFFERENTIAL/PLATELET
Abs Immature Granulocytes: 0.27 10*3/uL — ABNORMAL HIGH (ref 0.00–0.07)
BASOS PCT: 1 %
Basophils Absolute: 0 10*3/uL (ref 0.0–0.1)
EOS ABS: 0.2 10*3/uL (ref 0.0–0.5)
Eosinophils Relative: 2 %
HCT: 41.4 % (ref 39.0–52.0)
Hemoglobin: 13.7 g/dL (ref 13.0–17.0)
Immature Granulocytes: 3 %
Lymphocytes Relative: 14 %
Lymphs Abs: 1.2 10*3/uL (ref 0.7–4.0)
MCH: 33.4 pg (ref 26.0–34.0)
MCHC: 33.1 g/dL (ref 30.0–36.0)
MCV: 101 fL — ABNORMAL HIGH (ref 80.0–100.0)
Monocytes Absolute: 0.8 10*3/uL (ref 0.1–1.0)
Monocytes Relative: 9 %
Neutro Abs: 6.1 10*3/uL (ref 1.7–7.7)
Neutrophils Relative %: 71 %
PLATELETS: 199 10*3/uL (ref 150–400)
RBC: 4.1 MIL/uL — ABNORMAL LOW (ref 4.22–5.81)
RDW: 13.8 % (ref 11.5–15.5)
WBC: 8.4 10*3/uL (ref 4.0–10.5)
nRBC: 0 % (ref 0.0–0.2)

## 2018-02-10 LAB — I-STAT TROPONIN, ED: Troponin i, poc: 0.01 ng/mL (ref 0.00–0.08)

## 2018-02-10 LAB — COMPREHENSIVE METABOLIC PANEL
ALT: 30 U/L (ref 0–44)
AST: 24 U/L (ref 15–41)
Albumin: 3.1 g/dL — ABNORMAL LOW (ref 3.5–5.0)
Alkaline Phosphatase: 58 U/L (ref 38–126)
Anion gap: 7 (ref 5–15)
BUN: 27 mg/dL — ABNORMAL HIGH (ref 8–23)
CO2: 25 mmol/L (ref 22–32)
Calcium: 8.7 mg/dL — ABNORMAL LOW (ref 8.9–10.3)
Chloride: 102 mmol/L (ref 98–111)
Creatinine, Ser: 1.17 mg/dL (ref 0.61–1.24)
GFR calc Af Amer: >60 mL/min (ref >60)
GFR calc non Af Amer: >60 mL/min (ref >60)
Glucose, Bld: 106 mg/dL — ABNORMAL HIGH (ref 70–99)
Potassium: 5.4 mmol/L — ABNORMAL HIGH (ref 3.5–5.1)
Sodium: 134 mmol/L — ABNORMAL LOW (ref 135–145)
TOTAL PROTEIN: 6.1 g/dL — AB (ref 6.5–8.1)
Total Bilirubin: 0.9 mg/dL (ref 0.3–1.2)

## 2018-02-10 LAB — BRAIN NATRIURETIC PEPTIDE: B Natriuretic Peptide: 141.6 pg/mL — ABNORMAL HIGH (ref 0.0–100.0)

## 2018-02-10 LAB — PROTIME-INR
INR: 2.32
Prothrombin Time: 25.1 seconds — ABNORMAL HIGH (ref 11.4–15.2)

## 2018-02-10 MED ORDER — RANOLAZINE ER 500 MG PO TB12
500.0000 mg | ORAL_TABLET | Freq: Two times a day (BID) | ORAL | Status: DC
  Administered 2018-02-10 – 2018-02-11 (×2): 500 mg via ORAL
  Filled 2018-02-10 (×2): qty 1

## 2018-02-10 MED ORDER — ALLOPURINOL 300 MG PO TABS
300.0000 mg | ORAL_TABLET | Freq: Every morning | ORAL | Status: DC
  Administered 2018-02-11: 300 mg via ORAL
  Filled 2018-02-10: qty 1

## 2018-02-10 MED ORDER — DM-GUAIFENESIN ER 30-600 MG PO TB12: 1.0000 | ORAL_TABLET | Freq: Two times a day (BID) | ORAL | Status: DC | PRN

## 2018-02-10 MED ORDER — HYDRALAZINE HCL 20 MG/ML IJ SOLN: 5.0000 mg | INTRAMUSCULAR | Status: DC | PRN

## 2018-02-10 MED ORDER — ALBUTEROL SULFATE (2.5 MG/3ML) 0.083% IN NEBU: 2.5000 mg | INHALATION_SOLUTION | RESPIRATORY_TRACT | Status: DC | PRN

## 2018-02-10 MED ORDER — SOTALOL HCL 120 MG PO TABS
120.0000 mg | ORAL_TABLET | Freq: Two times a day (BID) | ORAL | Status: DC
  Administered 2018-02-11 (×2): 120 mg via ORAL
  Filled 2018-02-10 (×3): qty 1

## 2018-02-10 MED ORDER — KETOROLAC TROMETHAMINE 0.5 % OP SOLN
1.0000 [drp] | Freq: Two times a day (BID) | OPHTHALMIC | Status: DC
  Administered 2018-02-11 (×2): 1 [drp] via OPHTHALMIC
  Filled 2018-02-10: qty 5

## 2018-02-10 MED ORDER — MORPHINE SULFATE (PF) 2 MG/ML IV SOLN: 2.0000 mg | INTRAVENOUS | Status: DC | PRN

## 2018-02-10 MED ORDER — LISINOPRIL 20 MG PO TABS
20.0000 mg | ORAL_TABLET | Freq: Every day | ORAL | Status: DC
  Administered 2018-02-10: 20 mg via ORAL
  Filled 2018-02-10: qty 1

## 2018-02-10 MED ORDER — ASPIRIN 81 MG PO CHEW
324.0000 mg | CHEWABLE_TABLET | Freq: Once | ORAL | Status: AC
  Administered 2018-02-10: 324 mg via ORAL
  Filled 2018-02-10: qty 4

## 2018-02-10 MED ORDER — ZOLPIDEM TARTRATE 5 MG PO TABS: 5.0000 mg | ORAL_TABLET | Freq: Every evening | ORAL | Status: DC | PRN

## 2018-02-10 MED ORDER — PANTOPRAZOLE SODIUM 40 MG PO TBEC
40.0000 mg | DELAYED_RELEASE_TABLET | Freq: Every day | ORAL | Status: DC
  Administered 2018-02-10: 40 mg via ORAL
  Filled 2018-02-10: qty 1

## 2018-02-10 MED ORDER — SODIUM POLYSTYRENE SULFONATE 15 GM/60ML PO SUSP
30.0000 g | Freq: Once | ORAL | Status: AC
  Administered 2018-02-10: 30 g via ORAL
  Filled 2018-02-10: qty 120

## 2018-02-10 MED ORDER — ISOSORBIDE MONONITRATE ER 30 MG PO TB24
30.0000 mg | ORAL_TABLET | Freq: Two times a day (BID) | ORAL | Status: DC
  Administered 2018-02-10 – 2018-02-11 (×2): 30 mg via ORAL
  Filled 2018-02-10 (×2): qty 1

## 2018-02-10 MED ORDER — VITAMIN D 25 MCG (1000 UNIT) PO TABS
3000.0000 [IU] | ORAL_TABLET | Freq: Every day | ORAL | Status: DC
  Administered 2018-02-11: 3000 [IU] via ORAL
  Filled 2018-02-10: qty 3

## 2018-02-10 MED ORDER — WARFARIN SODIUM 5 MG PO TABS: 5.0000 mg | ORAL_TABLET | ORAL | Status: DC

## 2018-02-10 MED ORDER — WARFARIN - PHARMACIST DOSING INPATIENT: Freq: Every day | Status: DC

## 2018-02-10 MED ORDER — ACETAMINOPHEN 325 MG PO TABS: 650.0000 mg | ORAL_TABLET | ORAL | Status: DC | PRN

## 2018-02-10 MED ORDER — VITAMIN B-12 1000 MCG PO TABS
1000.0000 ug | ORAL_TABLET | Freq: Every day | ORAL | Status: DC
  Administered 2018-02-10: 1000 ug via ORAL
  Filled 2018-02-10: qty 1

## 2018-02-10 MED ORDER — CARVEDILOL 3.125 MG PO TABS
3.1250 mg | ORAL_TABLET | Freq: Two times a day (BID) | ORAL | Status: DC
  Administered 2018-02-11: 3.125 mg via ORAL
  Filled 2018-02-10: qty 1

## 2018-02-10 MED ORDER — ONDANSETRON HCL 4 MG/2ML IJ SOLN: 4.0000 mg | Freq: Four times a day (QID) | INTRAMUSCULAR | Status: DC | PRN

## 2018-02-10 MED ORDER — SODIUM CHLORIDE 0.9 % IV SOLN
INTRAVENOUS | Status: DC
  Administered 2018-02-11 (×2): via INTRAVENOUS
  Administered 2018-02-11: 50 mL/h via INTRAVENOUS

## 2018-02-10 MED ORDER — WARFARIN SODIUM 2.5 MG PO TABS
2.5000 mg | ORAL_TABLET | ORAL | Status: DC
  Administered 2018-02-10: 2.5 mg via ORAL
  Filled 2018-02-10: qty 1

## 2018-02-10 MED ORDER — NITROGLYCERIN 0.4 MG SL SUBL: 0.4000 mg | SUBLINGUAL_TABLET | SUBLINGUAL | Status: DC | PRN

## 2018-02-10 NOTE — ED Triage Notes (Signed)
Pt arrives to ED from home with complaints of left sided chest pain that occurred today when pt was feeding his goats. Pt states he took 1 nitro and his chest pain went away. Pt states he has had flu like sx and felt weak and fatigued since before christmas. Pt has seen PCP multiple times about this but was negative for flu. Pt has appointment with cardiologist this upcoming Tuesday. Pt has a pacemaker. Pt placed in position of comfort with bed locked and lowered, call bell in reach.

## 2018-02-10 NOTE — ED Provider Notes (Signed)
[facility] EMERGENCY DEPARTMENT Provider Note   CSN: [id number] Arrival date & time: 02/10/18  1845     History   Chief Complaint Chief Complaint  Patient presents with  . Chest Pain  . Weakness    HPI Dylan Roth is a 78 y.o. male.  78yo M w/ PMH including A fib on coumadin, CAD, HTN, pacemaker who p/w CP and weakness. For the past 2-3 weeks he's had "the flu", including achiness, productive cough. He has seen several providers and all tests have been normal except his sodium was low and he's been drinking gatorade. Bloodwork rechecked 6 days ago and it was fine. For the past 3 weeks, he's had shortness of breath and weakness with exertion. Today, he went out to feed his goats and had SOB, weakness, and left-sided, "quivering" chest pain near his pacemaker. He took a NTG and it went away. He states he's had similar CP previously. He denies leg swelling, orthopnea, PND, fevers, urinary symptoms, vomiting, abdominal pain, or weight gain.   The history is provided by the patient.  Chest Pain  Associated symptoms: weakness   Weakness  Associated symptoms: chest pain     Past Medical History:  Diagnosis Date  . Angina   . Arthritis    "in my back"  . Atrial fibrillation/flutter   . BPH (benign prostatic hyperplasia)   . CAD (coronary artery disease) Dec 2005   Hx MI. 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 Central obesity   . Chronic back pain   . Gout    "once; I have it under control w/medicine"  . Hyperlipidemia   . Hypertension   . Pacemaker   . Sinus node dysfunction (HCC)    post termination pauses <5sec    Patient Active Problem List   Diagnosis Date Noted  . Encounter for laboratory examination 09/24/2015  . Hematuria, gross 01/29/2014  . 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 Anticoagulant long-term use 05/23/2011  . Atrial  fibrillation/flutter   . Sinus node dysfunction (HCC)   . HYPERCHOLESTEROLEMIA 10/16/2008  . Essential hypertension, benign 10/16/2008  . Coronary artery disease involving native coronary artery of native heart with unstable angina pectoris ([place]) 10/16/2008    Past Surgical History:  Procedure Laterality Date  . CARDIAC CATHETERIZATION  2005   "that what sent me to CABG"  . CARDIAC CATHETERIZATION N/A 09/22/2015   Procedure: Left Heart Cath and Cors/Grafts Angiography;  Surgeon: Burnell Blanks, MD;  Location: [facility];  Service: Cardiovascular;  Laterality: N/A;  . CATARACT EXTRACTION W/ INTRAOCULAR LENS  IMPLANT, BILATERAL  ~ 01/2011  . CHOLECYSTECTOMY  1978   . CORONARY ARTERY BYPASS GRAFT  2005   in [place] - LIMA to LAD, SVG to diagonal, SVG to ramus, SVG to OM and SVG to RCA   . PACEMAKER INSERTION  2014  . PERMANENT PACEMAKER INSERTION N/A 05/25/2011   Procedure: PERMANENT PACEMAKER INSERTION;  Surgeon: [name], MD;  Location: [facility] CATH LAB;  Service: Cardiovascular;  Laterality: N/A;  . TRANSURETHRAL RESECTION OF PROSTATE N/A 12/29/2013   Procedure: TRANSURETHRAL RESECTION OF THE PROSTATE WITH GYRUS INSTRUMENTS;  Surgeon: [name], MD;  Location: [facility];  Service: Urology;  Laterality: N/A;        Home Medications    Prior to Admission medications   Medication Sig Start Date End Date Taking?  Authorizing Provider  allopurinol (ZYLOPRIM) 300 MG tablet Take 300 mg by mouth every morning.     [provider]  atorvastatin (LIPITOR) 40 MG tablet Take 1 tablet (40 mg total) by mouth every evening. 04/15/14   Burnell Blanks, MD  carvedilol (COREG) 3.125 MG tablet Take 1 tablet (3.125 mg total) by mouth 2 (two) times daily with a meal. 04/03/17   Burnell Blanks, MD  Cholecalciferol (VITAMIN D PO) Take 5,000 Units by mouth daily. 08/17/15   [provider]  fluticasone (FLONASE) 50 MCG/ACT nasal spray Place 1 spray into  both nostrils as needed for allergies.    [provider]  isosorbide mononitrate (IMDUR) 30 MG 24 hr tablet Take 1 tablet (30 mg total) by mouth 2 (two) times daily. 04/13/17   Burnell Blanks, MD  lisinopril (PRINIVIL,ZESTRIL) 20 MG tablet Take 20 mg by mouth at bedtime.     [provider]  loratadine (CLARITIN) 10 MG tablet Take 10 mg by mouth every other day. 12/13/15   [provider]  nitroGLYCERIN (NITROSTAT) 0.4 MG SL tablet DISSOLVE ONE TABLET UNDER THE TONGUE EVERY 5 MINUTES AS NEEDED FOR CHEST PAIN.  DO NOT EXCEED A TOTAL OF 3 DOSES IN 15 MINUTES 09/28/16   Burnell Blanks, MD  Omega-3 Fatty Acids (FISH OIL PO) Take 1 capsule by mouth daily.    [provider]  ranolazine (RANEXA) 500 MG 12 hr tablet TAKE 1 TABLET TWICE DAILY 09/10/17   Burnell Blanks, MD  sotalol (BETAPACE) 120 MG tablet Take 1 tablet (120 mg total) by mouth every 12 (twelve) hours. 04/03/17   Burnell Blanks, MD  warfarin (COUMADIN) 5 MG tablet TAKE 1 TABLET AS DIRECTED OR AS DIRECTED BY THE COUMADIN CLINIC 04/05/17   Burnell Blanks, MD    Family History Family History  Problem Relation Age of Onset  . Cancer Mother   . Stroke Brother   . Heart attack Neg Hx     Social History Social History   Tobacco Use  . Smoking status: Former Smoker    Packs/day: 0.00    Years: 0.00    Pack years: 0.00    Types: Cigarettes    Last attempt to quit: 01/30/1961    Years since quitting: 57.0  . Smokeless tobacco: Never Used  Substance Use Topics  . Alcohol use: No    Comment: 2 drinks (bourbon) a night  . Drug use: No     Allergies   Sulfa antibiotics; Sulfamethoxazole; and Sulfamethoxazole-trimethoprim   Review of Systems Review of Systems  Cardiovascular: Positive for chest pain.  Neurological: Positive for weakness.  All other systems reviewed and are negative except that which was mentioned in HPI    Physical Exam Updated Vital  Signs BP 119/71   Pulse 66   Temp 98.4 F (36.9 C) (Oral)   Resp (!) 21   SpO2 98%   Physical Exam Vitals signs and nursing note reviewed.  Constitutional:      General: He is not in acute distress.    Appearance: He is well-developed.     Comments: pleasant  HENT:     Head: Normocephalic and atraumatic.  Eyes:     Conjunctiva/sclera: Conjunctivae normal.  Neck:     Musculoskeletal: Neck supple.  Cardiovascular:     Rate and Rhythm: Normal rate and regular rhythm.     Heart sounds: Normal heart sounds. No murmur.  Pulmonary:     Effort: Pulmonary effort  is normal.     Breath sounds: Normal breath sounds.  Abdominal:     General: Bowel sounds are normal. There is no distension.     Palpations: Abdomen is soft.     Tenderness: There is no abdominal tenderness.  Musculoskeletal:     Right lower leg: No edema.     Left lower leg: No edema.  Skin:    General: Skin is warm and dry.  Neurological:     Mental Status: He is alert and oriented to person, place, and time.     Comments: Fluent speech  Psychiatric:        Judgment: Judgment normal.      ED Treatments / Results  Labs (all labs ordered are listed, but only abnormal results are displayed) Labs Reviewed  COMPREHENSIVE METABOLIC PANEL - Abnormal; Notable for the following components:      Result Value   Sodium 134 (*)    Potassium 5.4 (*)    Glucose, Bld 106 (*)    BUN 27 (*)    Calcium 8.7 (*)    Total Protein 6.1 (*)    Albumin 3.1 (*)    All other components within normal limits  CBC WITH DIFFERENTIAL/PLATELET - Abnormal; Notable for the following components:   RBC 4.10 (*)    MCV 101.0 (*)    Abs Immature Granulocytes 0.27 (*)    All other components within normal limits  BRAIN NATRIURETIC PEPTIDE - Abnormal; Notable for the following components:   B Natriuretic Peptide 141.6 (*)    All other components within normal limits  PROTIME-INR - Abnormal; Notable for the following components:    Prothrombin Time 25.1 (*)    All other components within normal limits  I-STAT TROPONIN, ED    EKG EKG Interpretation  Date/Time:  Sunday February 10 2018 18:52:17 EST Ventricular Rate:  61 PR Interval:    QRS Duration: 100 QT Interval:  397 QTC Calculation: 400 R Axis:   40 Text Interpretation:  Sinus rhythm Ventricular premature complex RSR' in V1 or V2, right VCD or RVH Borderline T abnormalities, anterior leads Baseline wander in lead(s) V3 V4 previous rhythm paced, no sinus with PVC, no STEMI Confirmed by Theotis Burrow [phone]) on 02/10/2018 7:21:08 PM   Radiology Dg Chest 2 View  Result Date: 02/10/2018 CLINICAL DATA:  78 year old two-day history of chest pain, generalized weakness and shortness of breath on exertion. EXAM: CHEST - 2 VIEW COMPARISON:  12/23/2013 and earlier. FINDINGS: Prior sternotomy for CABG. Cardiac silhouette moderately enlarged, unchanged. LEFT subclavian dual lead transvenous pacemaker lead tips at the expected location of the RIGHT atrial appendage and RV apex, unchanged. Lungs clear. Bronchovascular markings normal. Pulmonary vascularity normal. No visible pleural effusions. No pneumothorax. Degenerative changes and DISH involving the thoracic spine. Severe degenerative changes involving the LEFT shoulder. IMPRESSION: Stable cardiomegaly. No acute cardiopulmonary disease. Electronically Signed   By: Evangeline Dakin M.D.   On: 02/10/2018 20:46    Procedures Procedures (including critical care time)  Medications Ordered in ED Medications  aspirin chewable tablet 324 mg (324 mg Oral Given 02/10/18 2010)     Initial Impression / Assessment and Plan / ED Course  I have reviewed the triage vital signs and the nursing notes.  Pertinent labs & imaging results that were available during my care of the patient were reviewed by me and considered in my medical decision making (see chart for details).    Pleasant and well-appearing on exam with normal vital  signs.  Denied any chest pain.  EKG shows sinus rhythm, no ischemic changes.  Gave full dose aspirin.  His lab work shows active initial troponin, sodium 134, potassium 5.4, creatinine 1.17, normal hemoglobin.  NR 2.3.  Chest x-ray is stable from previous.  I am concerned that his symptoms represent unstable angina especially given his extensive cardiac history.  I discussed with cardiologist on-call, Delfina Redwood, who recommended medicine admission for ECHO and cardiology evaluation in the morning. Discussed w/ Dr. Blaine Hamper, [facility], and pt admitted for further care.  Final Clinical Impressions(s) / ED Diagnoses   Final diagnoses:  Unstable angina [facility])    ED Discharge Orders    None       Ashaz Robling, Wenda Overland, MD 02/10/18 2200

## 2018-02-10 NOTE — H&P (Signed)
History and Physical    LUIGI STUCKEY JOA:[id number] DOB: Oct 06, 1940 DOA: 02/10/2018  Referring MD/NP/PA:   PCP: Verdell Carmine., MD   Patient coming from:  The patient is coming from home.  At baseline, pt is independent for most of ADL.        Chief Complaint: Chest pain, shortness of breath, generalized weakness  HPI: NOAH LEMBKE is a 78 y.o. male with medical history significant of hypertension, hyperlipidemia, atrial fibrillation on Coumadin, CAD, CABG, BPH, chronic back pain, sinoatrial node dysfunction, pacemaker placement, gout, who presents with chest pain, shortness breath and generalized weakness.  Patient states that he has not been feeling well for more than 2 weeks.  He states that he has flulike symptoms, including body aches, cough, generalized weakness, shortness of breath.  No runny nose, sore throat, fever or chills. He has seen PCP several times. All tests have been normal except his sodium was low and he's been drinking gatorade. Bloodwork rechecked 6 days ago and it was fine.  Patient states that he has chronic intermittent mild chest pain since he had heart attack.  The chest pain is located in the left side of chest, over pacemaker placement area, intermittent, mild, dull, 2-3 out of 10 in severity, nonradiating.  He had chest pain again today, which has resolved after he took nitroglycerin.  He has mild exertional shortness of breath, and cough with clear mucus production.  No tenderness in the calf areas.  He has nausea, but no vomiting, diarrhea or abdominal pain.  No symptoms of UTI or unilateral weakness.  09/26/15 by Dr. Angelena Form:  Prox RCA to Mid RCA lesion, 100 %stenosed.  Ost 2nd Diag lesion, 100 %stenosed.  SVG and is normal in caliber and anatomically normal.  LM lesion, 60 %stenosed.  Ost Cx lesion, 50 %stenosed.  Ost 1st Mrg lesion, 100 %stenosed.  SVG graft was visualized by angiography and is normal in caliber and anatomically  normal.  Prox LAD lesion, 100 %stenosed.  LIMA graft was visualized by angiography and is normal in caliber and anatomically normal.  1st Diag lesion, 90 %stenosed.   1. Severe triple vessel CAD s/p ?5V CABG with 3/5 patent bypass grafts. His CAB[place] was [place] in [place] state. The documentation in our record over the years states 5V CABG. No vein graft markers found today so I cannot exclude patency of 2 other vein grafts. Unable to identify vein grafts with aortic root angiogram.  2. Occluded mid LAD with filling of the mid and distal vessel from the patent LIMA graft 3. Occluded intermediate and diagonal branches which fill from the patent vein grafts.  4. Cannot identify a graft to the Diagonal or the RCA 5. Moderate left main stenosis supplying the moderate caliber circumflex.  6. Dilatation of the aortic root.  Recommendations: No targets for PCI but cannot visualize 2 additional vein grafts that are reported in his medical record. Will arrange Coronary CTA to see if we can visualize additional bypass grafts and also to get a better estimate of the size of his aortic root.    ED Course: pt was found to have INR 2.32, negative troponin, BNP 141.6, WBC 8.4, potassium 5.4, creatinine 1.17, BUN 27, GFR> 60, temperature normal, no tachycardia, oxygen saturation 98% on room air.  Chest x-ray showed cardiomegaly without edema or infiltration.  Patient is placed on telemetry bed for observation.  Review of Systems:   General: no fevers, chills, no body weight gain, has fatigue HEENT:  no blurry vision, hearing changes or sore throat Respiratory: has dyspnea, coughing, no wheezing CV: has chest pain, no palpitations GI: has nausea, no vomiting, abdominal pain, diarrhea, constipation GU: no dysuria, burning on urination, increased urinary frequency, hematuria  Ext: no leg edema Neuro: no unilateral weakness, numbness, or tingling, no vision change or hearing loss Skin: no rash, no skin  tear. MSK: No muscle spasm, no deformity, no limitation of range of movement in spin Heme: No easy bruising.  Travel history: No recent long distant travel.  Allergy:  Allergies  Allergen Reactions  . Sulfa Antibiotics Other (See Comments)    SWELLING ON PATIENTS LIPS, HE GETS DRIED UP  . Sulfamethoxazole Other (See Comments)    Unknown  . Sulfamethoxazole-Trimethoprim Other (See Comments)    Unknown    Past Medical History:  Diagnosis Date  . Angina   . Arthritis    "in my back"  . Atrial fibrillation/flutter   . BPH (benign prostatic hyperplasia)   . CAD (coronary artery disease) Dec 2005   Hx MI. 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 Central obesity   . Chronic back pain   . Gout    "once; I have it under control w/medicine"  . Hyperlipidemia   . Hypertension   . Pacemaker   . Sinus node dysfunction (HCC)    post termination pauses <5sec    Past Surgical History:  Procedure Laterality Date  . CARDIAC CATHETERIZATION  2005   "that what sent me to CABG"  . CARDIAC CATHETERIZATION N/A 09/22/2015   Procedure: Left Heart Cath and Cors/Grafts Angiography;  Surgeon: Burnell Blanks, MD;  Location: [place] [facility];  Service: Cardiovascular;  Laterality: N/A;  . CATARACT EXTRACTION W/ INTRAOCULAR LENS  IMPLANT, BILATERAL  ~ 01/2011  . CHOLECYSTECTOMY  1978   . CORONARY ARTERY BYPASS GRAFT  2005   in [place] - LIMA to LAD, SVG to diagonal, SVG to ramus, SVG to OM and SVG to RCA   . PACEMAKER INSERTION  2014  . PERMANENT PACEMAKER INSERTION N/A 05/25/2011   Procedure: PERMANENT PACEMAKER INSERTION;  Surgeon: [name], MD;  Location[facility];  Service: Cardiovascular;  Laterality: N/A;  . TRANSURETHRAL RESECTION OF PROSTATE N/A 12/29/2013   Procedure: TRANSURETHRAL RESECTION OF THE PROSTATE WITH GYRUS INSTRUMENTS;  Surgeon: [name], MD;  Location: [facility];  Service: Urology;  Laterality: N/A;     Social History:  reports that he quit smoking about 57 years ago. His smoking use included cigarettes. He smoked 0.00 packs per day for 0.00 years. He has never used smokeless tobacco. He reports that he does not drink alcohol or use drugs.  Family History:  Family History  Problem Relation Age of Onset  . Cancer Mother   . Stroke Brother   . Heart attack Neg Hx      Prior to Admission medications   Medication Sig Start Date End Date Taking? Authorizing Provider  allopurinol (ZYLOPRIM) 300 MG tablet Take 300 mg by mouth every morning.     [provider]  atorvastatin (LIPITOR) 40 MG tablet Take 1 tablet (40 mg total) by mouth every evening. 04/15/14   Burnell Blanks, MD  carvedilol (COREG) 3.125 MG tablet Take 1 tablet (3.125 mg total) by mouth 2 (two) times daily with a meal. 04/03/17   Burnell Blanks, MD  Cholecalciferol (VITAMIN D PO) Take 5,000 Units by  mouth daily. 08/17/15   [provider]  fluticasone (FLONASE) 50 MCG/ACT nasal spray Place 1 spray into both nostrils as needed for allergies.    [provider]  isosorbide mononitrate (IMDUR) 30 MG 24 hr tablet Take 1 tablet (30 mg total) by mouth 2 (two) times daily. 04/13/17   Burnell Blanks, MD  lisinopril (PRINIVIL,ZESTRIL) 20 MG tablet Take 20 mg by mouth at bedtime.     [provider]  loratadine (CLARITIN) 10 MG tablet Take 10 mg by mouth every other day. 12/13/15   [provider]  nitroGLYCERIN (NITROSTAT) 0.4 MG SL tablet DISSOLVE ONE TABLET UNDER THE TONGUE EVERY 5 MINUTES AS NEEDED FOR CHEST PAIN.  DO NOT EXCEED A TOTAL OF 3 DOSES IN 15 MINUTES 09/28/16   Burnell Blanks, MD  Omega-3 Fatty Acids (FISH OIL PO) Take 1 capsule by mouth daily.    [provider]  ranolazine (RANEXA) 500 MG 12 hr tablet TAKE 1 TABLET TWICE DAILY 09/10/17   Burnell Blanks, MD  sotalol (BETAPACE) 120 MG tablet Take 1 tablet (120 mg total) by mouth  every 12 (twelve) hours. 04/03/17   Burnell Blanks, MD  warfarin (COUMADIN) 5 MG tablet TAKE 1 TABLET AS DIRECTED OR AS DIRECTED BY THE COUMADIN CLINIC 04/05/17   Burnell Blanks, MD    Physical Exam: Vitals:   02/10/18 1855 02/10/18 2000 02/10/18 2115 02/10/18 2200  BP: (!) 166/98 132/73 119/71 (!) 142/73  Pulse: 64 60 66 92  Resp: 12 17 (!) 21 11  Temp: 98.4 F (36.9 C)     TempSrc: Oral     SpO2: 100% 100% 98% 98%   General: Not in acute distress HEENT:       Eyes: PERRL, EOMI, no scleral icterus.       ENT: No discharge from the ears and nose, no pharynx injection, no tonsillar enlargement.        Neck: No JVD, no bruit, no mass felt. Heme: No neck lymph node enlargement. Cardiac: S1/S2, RRR, No murmurs, No gallops or rubs. Respiratory: No rales, wheezing, rhonchi or rubs. GI: Soft, nondistended, nontender, no rebound pain, no organomegaly, BS present. GU: No hematuria Ext: No pitting leg edema bilaterally. 2+DP/PT pulse bilaterally. Musculoskeletal: No joint deformities, No joint redness or warmth, no limitation of ROM in spin. Skin: No rashes.  Neuro: Alert, oriented X3, cranial nerves II-XII grossly intact, moves all extremities normally.  Psych: Patient is not psychotic, no suicidal or hemocidal ideation.  Labs on Admission: I have personally reviewed following labs and imaging studies  CBC: Recent Labs  Lab 02/10/18 1951  WBC 8.4  NEUTROABS 6.1  HGB 13.7  HCT 41.4  MCV 101.0*  PLT 564   Basic Metabolic Panel: Recent Labs  Lab 02/10/18 1951  NA 134*  K 5.4*  CL 102  CO2 25  GLUCOSE 106*  BUN 27*  CREATININE 1.17  CALCIUM 8.7*   GFR: CrCl cannot be calculated (Unknown ideal weight.). Liver Function Tests: Recent Labs  Lab 02/10/18 1951  AST 24  ALT 30  ALKPHOS 58  BILITOT 0.9  PROT 6.1*  ALBUMIN 3.1*   No results for input(s): LIPASE, AMYLASE in the last 168 hours. No results for input(s): AMMONIA in the last 168  hours. Coagulation Profile: Recent Labs  Lab 02/10/18 1951  INR 2.32   Cardiac Enzymes: No results for input(s): CKTOTAL, CKMB, CKMBINDEX, TROPONINI in the last 168 hours. BNP (last 3 results) No results for  input(s): PROBNP in the last 8760 hours. HbA1C: No results for input(s): HGBA1C in the last 72 hours. CBG: No results for input(s): GLUCAP in the last 168 hours. Lipid Profile: No results for input(s): CHOL, HDL, LDLCALC, TRIG, CHOLHDL, LDLDIRECT in the last 72 hours. Thyroid Function Tests: No results for input(s): TSH, T4TOTAL, FREET4, T3FREE, THYROIDAB in the last 72 hours. Anemia Panel: No results for input(s): VITAMINB12, FOLATE, FERRITIN, TIBC, IRON, RETICCTPCT in the last 72 hours. Urine analysis:    Component Value Date/Time   COLORURINE RED (A) 01/29/2014 0302   APPEARANCEUR TURBID (A) 01/29/2014 0302   LABSPEC 1.029 01/29/2014 0302   PHURINE 5.0 01/29/2014 0302   GLUCOSEU 250 (A) 01/29/2014 0302   HGBUR LARGE (A) 01/29/2014 0302   BILIRUBINUR LARGE (A) 01/29/2014 0302   KETONESUR >80 (A) 01/29/2014 0302   PROTEINUR >300 (A) 01/29/2014 0302   UROBILINOGEN >8.0 (H) 01/29/2014 0302   NITRITE POSITIVE (A) 01/29/2014 0302   LEUKOCYTESUR LARGE (A) 01/29/2014 0302   Sepsis Labs: @LABRCNTIP (procalcitonin:4,lacticidven:4) )No results found for this or any previous visit (from the past 240 hour(s)).   Radiological Exams on Admission: Dg Chest 2 View  Result Date: 02/10/2018 CLINICAL DATA:  78 year old two-day history of chest pain, generalized weakness and shortness of breath on exertion. EXAM: CHEST - 2 VIEW COMPARISON:  12/23/2013 and earlier. FINDINGS: Prior sternotomy for CABG. Cardiac silhouette moderately enlarged, unchanged. LEFT subclavian dual lead transvenous pacemaker lead tips at the expected location of the RIGHT atrial appendage and RV apex, unchanged. Lungs clear. Bronchovascular markings normal. Pulmonary vascularity normal. No visible pleural  effusions. No pneumothorax. Degenerative changes and DISH involving the thoracic spine. Severe degenerative changes involving the LEFT shoulder. IMPRESSION: Stable cardiomegaly. No acute cardiopulmonary disease. Electronically Signed   By: Evangeline Dakin M.D.   On: 02/10/2018 20:46     EKG: Independently reviewed.  Sinus rhythm, QTC 400, low voltage, early R wave progression, nonspecific T wave change.   Assessment/Plan Principal Problem:   Chest pain Active Problems:   HYPERCHOLESTEROLEMIA   Essential hypertension, benign   Atrial fibrillation/flutter   Pacemaker-[employer]   HTN (hypertension)   Gout   CAD (coronary artery disease)   Hx of CABG   Hyperkalemia   Chest pain and hx of CAD: s/p of CABG. Patient has not been feeling well for more than 2 weeks,  With intermittent chest pain, exertional shortness of breath.  No leg edema  orJVD, clinically does not have CHF exacerbation.  No fever or chills, clinically no pneumonia.  Given history of CABG and significant cardiovascular risk factors, will do chest pain rule out to make sure patient does not have ACS. EDP discussed with on-call card, Dr. Hassell Done, who recommended ECHO and cardiology evaluation in the morning.  - will place on Tele bed for obs - cycle CE q6 x3 and repeat EKG in the am  - prn Nitroglycerin, Morphine -Patient stopped taking Lipitor due to muscle aches -Continue Coreg, sotalol, ranolazine, Imdur - pt received 324 mg of ASA, will not continue sine pt is on Couamdin - Risk factor stratification: will check FLP and A1C, UDS - 2D echo. - DEP will help to do pacemaker interrogation. - inpt card consult was requested via Epic  HYPERCHOLESTEROLEMIA: -hold off Lipitor (patient had muscle aches, stopped taking this medication a week ago  HTN:  -Continue home medications: Sotalol, Coreg, lisinopril -IV hydralazine prn  Atrial Fibrillation: CHA2DS2-VASc Score is 4, needs oral anticoagulation. Patient is on Coumadin  at home. INR  is 2.32 on admission. Heart rate is well controlled. -continue sotalol, Coreg -Coumadin per pharmacy  Gout: -continue home allopurinol  Hyperkalemia: K=5.4 -Kayexalate 30 g -IV fluid: Normal saline at 75 cc/h    DVT ppx: on Coumdin Code Status: Full code Family Communication: None at bed side.   Disposition Plan:  Anticipate discharge back to previous home environment Consults called:  none Admission status: Obs / tele   Date of Service 02/10/2018    Ivor Costa [facility] Pager [phone]  If 7PM-7AM, please contact night-coverage [email or web address] Password [facility] 02/10/2018, 10:47 PM

## 2018-02-10 NOTE — ED Notes (Signed)
Patient transported to X-ray 

## 2018-02-10 NOTE — ED Notes (Signed)
IV nurse currently at bedside,  She will collect labs.

## 2018-02-10 NOTE — Progress Notes (Signed)
ANTICOAGULATION CONSULT NOTE - Initial Consult  Pharmacy Consult for Coumadin Indication: atrial fibrillation  Allergies  Allergen Reactions  . Sulfa Antibiotics Other (See Comments)    SWELLING ON PATIENTS LIPS, HE GETS DRIED UP  . Sulfamethoxazole Other (See Comments)    Unknown  . Sulfamethoxazole-Trimethoprim Other (See Comments)    Unknown    Patient Measurements:    Vital Signs: Temp: 98.4 F (36.9 C) (01/12 1855) Temp Source: Oral (01/12 1855) BP: 142/73 (01/12 2200) Pulse Rate: 92 (01/12 2200)  Labs: Recent Labs    02/10/18 1951  HGB 13.7  HCT 41.4  PLT 199  LABPROT 25.1*  INR 2.32  CREATININE 1.17    CrCl cannot be calculated (Unknown ideal weight.).   Medical History: Past Medical History:  Diagnosis Date  . Angina   . Arthritis    "in my back"  . Atrial fibrillation/flutter   . BPH (benign prostatic hyperplasia)   . CAD (coronary artery disease) Dec 2005   Hx MI. 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 Central obesity   . Chronic back pain   . Gout    "once; I have it under control w/medicine"  . Hyperlipidemia   . Hypertension   . Pacemaker   . Sinus node dysfunction (HCC)    post termination pauses <5sec    Medications:  See home med rec  Assessment: 78 y.o. M presents with CP, SOB, and weakness. Pt on coumadin PTA for afib. Admission INR therapeutic 2.32. CBC ok on admission. Home dose: 2.5mg  daily except for 5mg  on Monday (last dose 1/11)  Goal of Therapy:  INR 2-3 Monitor platelets by anticoagulation protocol: Yes   Plan:  Daily INR Continue home coumadin 2.5mg  daily except for 5mg  on Monday   Sherlon Handing, PharmD, BCPS Clinical pharmacist  **Pharmacist phone directory can now be found on [email or web address] ([email or web address]).  Listed under [place]. 02/10/2018,10:30 PM

## 2018-02-11 ENCOUNTER — Observation Stay (HOSPITAL_BASED_OUTPATIENT_CLINIC_OR_DEPARTMENT_OTHER): Payer: Medicare HMO

## 2018-02-11 ENCOUNTER — Encounter (HOSPITAL_COMMUNITY): Payer: Self-pay

## 2018-02-11 DIAGNOSIS — I480 Paroxysmal atrial fibrillation: Secondary | ICD-10-CM | POA: Diagnosis not present

## 2018-02-11 DIAGNOSIS — R0602 Shortness of breath: Secondary | ICD-10-CM | POA: Diagnosis not present

## 2018-02-11 DIAGNOSIS — R0789 Other chest pain: Secondary | ICD-10-CM

## 2018-02-11 DIAGNOSIS — I2510 Atherosclerotic heart disease of native coronary artery without angina pectoris: Secondary | ICD-10-CM | POA: Diagnosis not present

## 2018-02-11 DIAGNOSIS — R079 Chest pain, unspecified: Secondary | ICD-10-CM | POA: Diagnosis not present

## 2018-02-11 DIAGNOSIS — I10 Essential (primary) hypertension: Secondary | ICD-10-CM | POA: Diagnosis not present

## 2018-02-11 DIAGNOSIS — E7800 Pure hypercholesterolemia, unspecified: Secondary | ICD-10-CM | POA: Diagnosis not present

## 2018-02-11 DIAGNOSIS — Z951 Presence of aortocoronary bypass graft: Secondary | ICD-10-CM | POA: Diagnosis not present

## 2018-02-11 DIAGNOSIS — I4891 Unspecified atrial fibrillation: Secondary | ICD-10-CM | POA: Diagnosis not present

## 2018-02-11 LAB — LIPID PANEL
CHOLESTEROL: 183 mg/dL (ref 0–200)
HDL: 35 mg/dL — ABNORMAL LOW (ref >40)
LDL Cholesterol: 118 mg/dL — ABNORMAL HIGH (ref 0–99)
Total CHOL/HDL Ratio: 5.2 RATIO
Triglycerides: 152 mg/dL — ABNORMAL HIGH (ref <150)
VLDL: 30 mg/dL (ref 0–40)

## 2018-02-11 LAB — CBC
HCT: 37.6 % — ABNORMAL LOW (ref 39.0–52.0)
Hemoglobin: 13 g/dL (ref 13.0–17.0)
MCH: 34.7 pg — ABNORMAL HIGH (ref 26.0–34.0)
MCHC: 34.6 g/dL (ref 30.0–36.0)
MCV: 100.3 fL — ABNORMAL HIGH (ref 80.0–100.0)
Platelets: 198 10*3/uL (ref 150–400)
RBC: 3.75 MIL/uL — ABNORMAL LOW (ref 4.22–5.81)
RDW: 13.7 % (ref 11.5–15.5)
WBC: 6.6 10*3/uL (ref 4.0–10.5)
nRBC: 0 % (ref 0.0–0.2)

## 2018-02-11 LAB — HEMOGLOBIN A1C
Hgb A1c MFr Bld: 6.2 % — ABNORMAL HIGH (ref 4.8–5.6)
Mean Plasma Glucose: 131.24 mg/dL

## 2018-02-11 LAB — TROPONIN I
Troponin I: <0.03 ng/mL (ref <0.03)
Troponin I: <0.03 ng/mL (ref <0.03)
Troponin I: <0.03 ng/mL (ref <0.03)

## 2018-02-11 LAB — BASIC METABOLIC PANEL
Anion gap: 8 (ref 5–15)
BUN: 24 mg/dL — AB (ref 8–23)
CO2: 23 mmol/L (ref 22–32)
CREATININE: 1.12 mg/dL (ref 0.61–1.24)
Calcium: 8.3 mg/dL — ABNORMAL LOW (ref 8.9–10.3)
Chloride: 103 mmol/L (ref 98–111)
GFR calc Af Amer: >60 mL/min (ref >60)
GFR calc non Af Amer: >60 mL/min (ref >60)
Glucose, Bld: 98 mg/dL (ref 70–99)
Potassium: 4.5 mmol/L (ref 3.5–5.1)
Sodium: 134 mmol/L — ABNORMAL LOW (ref 135–145)

## 2018-02-11 LAB — RAPID URINE DRUG SCREEN, HOSP PERFORMED
Amphetamines: NOT DETECTED
Barbiturates: NOT DETECTED
Benzodiazepines: NOT DETECTED
Cocaine: NOT DETECTED
Opiates: NOT DETECTED
TETRAHYDROCANNABINOL: NOT DETECTED

## 2018-02-11 LAB — URINALYSIS, ROUTINE W REFLEX MICROSCOPIC
Bilirubin Urine: NEGATIVE
Glucose, UA: NEGATIVE mg/dL
Hgb urine dipstick: NEGATIVE
Ketones, ur: NEGATIVE mg/dL
Leukocytes, UA: NEGATIVE
Nitrite: NEGATIVE
Protein, ur: NEGATIVE mg/dL
Specific Gravity, Urine: 1.013 (ref 1.005–1.030)
pH: 5 (ref 5.0–8.0)

## 2018-02-11 LAB — PROTIME-INR
INR: 2.28
Prothrombin Time: 24.8 seconds — ABNORMAL HIGH (ref 11.4–15.2)

## 2018-02-11 LAB — ECHOCARDIOGRAM COMPLETE
HEIGHTINCHES: 64 in
WEIGHTICAEL: 2953.6 [oz_av]

## 2018-02-11 MED ORDER — ROSUVASTATIN CALCIUM 20 MG PO TABS: 20.0000 mg | ORAL_TABLET | Freq: Every day | ORAL | 1 refills | Status: DC

## 2018-02-11 MED ORDER — PANTOPRAZOLE SODIUM 40 MG PO TBEC: 40.0000 mg | DELAYED_RELEASE_TABLET | Freq: Two times a day (BID) | ORAL | 1 refills | Status: DC

## 2018-02-11 MED ORDER — ROSUVASTATIN CALCIUM 20 MG PO TABS: 20.0000 mg | ORAL_TABLET | Freq: Every day | ORAL | Status: DC

## 2018-02-11 NOTE — Evaluation (Signed)
Physical Therapy Evaluation Patient Details Name: Dylan Roth MRN: [id number] DOB: April 13, 1940 Today's Date: 02/11/2018   History of Present Illness  Pt is a 78 y/o male admitted secondary to chest pain and weakness. Reports "flu like" symptoms. Ischemic workup negative. PMH includes CAD s/p CABG, a fib, s/p pacemaker, gout, and HTN.   Clinical Impression  Patient evaluated by Physical Therapy with no further acute PT needs identified. All education has been completed and the patient has no further questions. Pt able to perform dynamic gait tasks without LOB this session. Pt performing at a gross mod I level. Educated about walking program to perform at home. Pt lives with his wife who can assist him when she is home. See below for any follow-up Physical Therapy or equipment needs. PT is signing off. Thank you for this referral. If needs change, please re-consult.      Follow Up Recommendations No PT follow up    Equipment Recommendations  None recommended by PT    Recommendations for Other Services       Precautions / Restrictions Precautions Precautions: None Restrictions Weight Bearing Restrictions: No      Mobility  Bed Mobility Overal bed mobility: Modified Independent             General bed mobility comments: Increased time, but no physical assist.   Transfers Overall transfer level: Modified independent                  Ambulation/Gait Ambulation/Gait assistance: Modified independent (Device/Increase time) Gait Distance (Feet): 200 Feet Assistive device: None Gait Pattern/deviations: Step-through pattern;Decreased stride length Gait velocity: Decreased    General Gait Details: Slower gait speed, however, overall steady and able to perform dynamic gait tasks without LOB.   Stairs Stairs: Yes       General stair comments: Verbally reviewed safe stair navigation and practiced marching in place with rail as stairway unavailable during session.  Able to perform marching tasks without LOB.   Wheelchair Mobility    Modified Rankin (Stroke Patients Only)       Balance Overall balance assessment: Needs assistance Sitting-balance support: No upper extremity supported;Feet supported Sitting balance-Leahy Scale: Normal     Standing balance support: No upper extremity supported;During functional activity Standing balance-Leahy Scale: Good                   Standardized Balance Assessment Standardized Balance Assessment : Dynamic Gait Index   Dynamic Gait Index Level Surface: Mild Impairment Gait with Horizontal Head Turns: Normal Gait with Vertical Head Turns: Mild Impairment Gait and Pivot Turn: Mild Impairment Step Over Obstacle: Normal Step Around Obstacles: Normal       Pertinent Vitals/Pain Pain Assessment: No/denies pain    Home Living Family/patient expects to be discharged to:: Private residence Living Arrangements: Spouse/significant other Available Help at Discharge: Family;Available PRN/intermittently Type of Home: House Home Access: Stairs to enter Entrance Stairs-Rails: Left;Right(one in the middle in the back ) Entrance Stairs-Number of Steps: 5-6 Home Layout: One level Home Equipment: Crutches      Prior Function Level of Independence: Independent               Hand Dominance        Extremity/Trunk Assessment   Upper Extremity Assessment Upper Extremity Assessment: Overall WFL for tasks assessed    Lower Extremity Assessment Lower Extremity Assessment: Overall WFL for tasks assessed    Cervical / Trunk Assessment Cervical / Trunk Assessment: Normal  Communication  Communication: No difficulties  Cognition Arousal/Alertness: Awake/alert Behavior During Therapy: WFL for tasks assessed/performed Overall Cognitive Status: Within Functional Limits for tasks assessed                                        General Comments General comments (skin  integrity, edema, etc.): Reviewed generalized walking program with pt to perform at home.     Exercises     Assessment/Plan    PT Assessment Patent does not need any further PT services  PT Problem List         PT Treatment Interventions      PT Goals (Current goals can be found in the Care Plan section)  Acute Rehab PT Goals PT Goal Formulation: All assessment and education complete, DC therapy Time For Goal Achievement: 02/11/18 Potential to Achieve Goals: Good    Frequency     Barriers to discharge        Co-evaluation               AM-PAC PT "6 Clicks" Mobility  Outcome Measure Help needed turning from your back to your side while in a flat bed without using bedrails?: None Help needed moving from lying on your back to sitting on the side of a flat bed without using bedrails?: None Help needed moving to and from a bed to a chair (including a wheelchair)?: None Help needed standing up from a chair using your arms (e.g., wheelchair or bedside chair)?: None Help needed to walk in hospital room?: None Help needed climbing 3-5 steps with a railing? : A Little 6 Click Score: 23    End of Session Equipment Utilized During Treatment: Gait belt Activity Tolerance: Patient tolerated treatment well Patient left: in chair;with call bell/phone within reach;with family/visitor present;with nursing/sitter in room Nurse Communication: Mobility status PT Visit Diagnosis: Other abnormalities of gait and mobility (R26.89);Muscle weakness (generalized) (M62.81)    Time: 0459-9774 PT Time Calculation (min) (ACUTE ONLY): 19 min   Charges:   PT Evaluation $PT Eval Low Complexity: [place], PT, DPT  Acute Rehabilitation Services  Pager: [phone] Office: [phone]   Rudean Hitt 02/11/2018, 2:17 PM

## 2018-02-11 NOTE — Consult Note (Addendum)
Cardiology Consult    Patient ID: TUFF CLABO MRN: [id number], DOB/AGE: August 16, 1940   Admit date: 02/10/2018 Date of Consult: 02/11/2018  Primary Physician: Verdell Carmine., MD Primary Cardiologist: Lauree Chandler, MD  Primary Electrophysiologist: Virl Axe, MD Requesting Provider: Ivor Costa, MD  Patient Profile    Dylan Roth is a 78 y.o. 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 Blaine Hamper.  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 Angelena Form and Dr. Caryl Comes. Last cardiac catheterization in 08/2015 showed 3 patent grafts (the other 2 grafts could not be visualized) and a proximally occluded RCA with no graft to this vessel. He was last seen by Dr. Angelena Form for follow-up in 10/2017 at which time he was doing well.  Patient presented to the [facility] ED on 02/10/2018 for evaluation of chest pain and weakness.  Patient reports flu like symptoms over the past 3 weeks mainly consisting of body aches and generalized weakness with some shortness of breath and nausea as well. Patient denies any fevers. He has been evaluating at Urgent Care multiple times. Patient reports that flu test came back negative and all lab work was negative other than mild hyponatremia. Patient took Chapman for symptoms last week and developed "oil taste" in his mouth. Patient seen by PCP last week who felt this may represent GERD. Patient reports worsening weakness yesterday. He states he was picking up branches from his driveway and did not think  he was going to be able to make it make to his house because he felt so weak. Patient had some mild non-radiating left-sided chest pain with this that he ranks as a 2-3/10. Pain resolved after taking 1 sublingual Nitroglycerin but patient states he thinks it would have resolved without the Nitroglycerin. Patient has had intermittent chest pain like this for years and does not feel like it has gotten worse and more frequent recently. Patient denies any orthopnea, PND, or edema. He also denies any palpitations, lightheadedness, or dizziness. Patient's biggest concern is this continued generalized weakness.  Upon arrival to the ED, patient hypertensive with BP on 155/98 but vitals stable. EKG showed no acute ischemic changes. Troponin negative x3. Chest x-ray showed stable cardiomegaly with no acute findings. BNP mildly elevated at 141.6. WBC 8.4, Hgb 13.7, Plts 199. Na 134, K 5.4, Glucose 106, [place] 1.17. Patient was admitted for further evaluation.  Past Medical History   Past Medical History:  Diagnosis Date  . Angina   . Arthritis    "in my back"  . Atrial fibrillation/flutter   . BPH (benign prostatic hyperplasia)   . CAD (coronary artery disease) Dec 2005   Hx MI. 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 Central obesity   . Chronic back pain   . Gout    "once; I have it under control w/medicine"  . Hyperlipidemia   . Hypertension   . Pacemaker   . Sinus node dysfunction (HCC)    post termination pauses <5sec  Past Surgical History:  Procedure Laterality Date  . CARDIAC CATHETERIZATION  2005   "that what sent me to CABG"  . CARDIAC CATHETERIZATION N/A 09/22/2015   Procedure: Left Heart Cath and Cors/Grafts Angiography;  Surgeon: Burnell Blanks, MD;  Location: [facility];  Service: Cardiovascular;  Laterality: N/A;  . CATARACT EXTRACTION W/ INTRAOCULAR LENS  IMPLANT, BILATERAL  ~ 01/2011  . CHOLECYSTECTOMY  1978   .  CORONARY ARTERY BYPASS GRAFT  2005   in [place] - LIMA to LAD, SVG to diagonal, SVG to ramus, SVG to OM and SVG to RCA   . PACEMAKER INSERTION  2014  . PERMANENT PACEMAKER INSERTION N/A 05/25/2011   Procedure: PERMANENT PACEMAKER INSERTION;  Surgeon: [name], MD;  Location: [facility] CATH LAB;  Service: Cardiovascular;  Laterality: N/A;  . TRANSURETHRAL RESECTION OF PROSTATE N/A 12/29/2013   Procedure: TRANSURETHRAL RESECTION OF THE PROSTATE WITH GYRUS INSTRUMENTS;  Surgeon: [name], MD;  Location: [facility];  Service: Urology;  Laterality: N/A;     Allergies  Allergies  Allergen Reactions  . Sulfa Antibiotics Other (See Comments)    SWELLING ON PATIENTS LIPS, HE GETS DRIED UP  . Sulfamethoxazole Other (See Comments)    Unknown  . Sulfamethoxazole-Trimethoprim Other (See Comments)    Unknown    Inpatient Medications    . allopurinol  300 mg Oral q morning - 10a  . carvedilol  3.125 mg Oral BID WC  . cholecalciferol  3,000 Units Oral Daily  . isosorbide mononitrate  30 mg Oral BID  . ketorolac  1 drop Right Eye BID  . lisinopril  20 mg Oral QHS  . pantoprazole  40 mg Oral QHS  . ranolazine  500 mg Oral BID  . sotalol  120 mg Oral Q12H  . vitamin B-12  1,000 mcg Oral QHS  . warfarin  2.5 mg Oral Once per day on Sun Tue Wed Thu Fri Sat  . warfarin  5 mg Oral Q Mon-1800  . Warfarin - Pharmacist Dosing Inpatient   Does not apply q1800    Family History    Family History  Problem Relation Age of Onset  . Cancer Mother   . Stroke Brother   . Heart attack Neg Hx    He indicated that his mother is deceased. He indicated that his father is deceased. He indicated that the status of his brother is unknown. He indicated that the status of his neg hx is unknown.   Social History    Social History   Socioeconomic History  . Marital status: Married    Spouse name: Not on file  . Number of children: 2  . Years of education: Not on file  . Highest education level: Not  on file  Occupational History  . Occupation: retired-truck Education administrator: RETIRED  Social Needs  . Financial resource strain: Not on file  . Food insecurity:    Worry: Not on file    Inability: Not on file  . Transportation needs:    Medical: Not on file    Non-medical: Not on file  Tobacco Use  . Smoking status: Former Smoker    Packs/day: 0.00    Years: 0.00    Pack years: 0.00    Types: Cigarettes    Last attempt to quit: 01/30/1961    Years since quitting: 57.0  . Smokeless tobacco: Never Used  Substance and Sexual Activity  . Alcohol use: No    Comment:  2 drinks (bourbon) a night  . Drug use: No  . Sexual activity: Yes  Lifestyle  . Physical activity:    Days per week: Not on file    Minutes per session: Not on file  . Stress: Not on file  Relationships  . Social connections:    Talks on phone: Not on file    Gets together: Not on file    Attends religious service: Not on file    Active member of club or organization: Not on file    Attends meetings of clubs or organizations: Not on file    Relationship status: Not on file  . Intimate partner violence:    Fear of current or ex partner: Not on file    Emotionally abused: Not on file    Physically abused: Not on file    Forced sexual activity: Not on file  Other Topics Concern  . Not on file  Social History Narrative   Married. Pt works part-time for a [employer]. Quit tobacco 50 years ago..Both parents are deceased, neither one had cardiac issues and no siblings have cardiac issues.      Review of Systems    Review of Systems  Constitutional: Positive for malaise/fatigue. Negative for chills and fever.  HENT: Negative for congestion and sore throat.   Respiratory: Positive for cough, sputum production and shortness of breath. Negative for hemoptysis.   Cardiovascular: Positive for chest pain. Negative for palpitations, orthopnea, leg swelling and PND.  Gastrointestinal: Positive for nausea. Negative  for abdominal pain, blood in stool, diarrhea and vomiting.  Genitourinary: Negative for hematuria.  Musculoskeletal: Positive for myalgias.  Neurological: Positive for weakness. Negative for dizziness.  All other systems reviewed and are negative.   Physical Exam    Blood pressure 94/72, pulse 67, temperature 98.4 F (36.9 C), temperature source Oral, resp. rate 18, height 5\' 4"  (1.626 m), weight 83.7 kg, SpO2 95 %.  General: 78 y.o. male resting comfortably in no acute distress. Pleasant and cooperative. HEENT: Normal  Neck: Supple. No carotid bruits or JVD appreciated. Lungs: No increased work of breathing. Clear to auscultation bilaterally. No wheezes, rhonchi, or rales.  Heart: RRR. Distinct S1 and S2. No murmurs, gallops, or rubs.  Abdomen: Soft, non-distended, and non-tender to palpation. Bowel sounds present.   Extremities: No lower extremity edema. Radial pulses 2+ and equal bilaterally. Skin: Warm and dry. Neuro: Alert and oriented x3. No focal deficits. Moves all extremities spontaneously. Psych: Normal affect. Responds appropriately.   Labs    Troponin [facility] Test) Recent Labs    02/10/18 2002  TROPIPOC 0.01   Recent Labs    02/11/18 0014 02/11/18 0355  TROPONINI <0.03 <0.03   Lab Results  Component Value Date   WBC 6.6 02/11/2018   HGB 13.0 02/11/2018   HCT 37.6 (L) 02/11/2018   MCV 100.3 (H) 02/11/2018   PLT 198 02/11/2018    Recent Labs  Lab 02/10/18 1951 02/11/18 0355  NA 134* 134*  K 5.4* 4.5  CL 102 103  CO2 25 23  BUN 27* 24*  CREATININE 1.17 1.12  CALCIUM 8.7* 8.3*  PROT 6.1*  --   BILITOT 0.9  --   ALKPHOS 58  --   ALT 30  --   AST 24  --   GLUCOSE 106* 98   Lab Results  Component Value Date   CHOL 183 02/11/2018   HDL 35 (L) 02/11/2018   LDLCALC 118 (H) 02/11/2018   TRIG  152 (H) 02/11/2018   No results found for: [facility]   Radiology Studies    Dg Chest 2 View  Result Date: 02/10/2018 CLINICAL DATA:  78 year old  two-day history of chest pain, generalized weakness and shortness of breath on exertion. EXAM: CHEST - 2 VIEW COMPARISON:  12/23/2013 and earlier. FINDINGS: Prior sternotomy for CABG. Cardiac silhouette moderately enlarged, unchanged. LEFT subclavian dual lead transvenous pacemaker lead tips at the expected location of the RIGHT atrial appendage and RV apex, unchanged. Lungs clear. Bronchovascular markings normal. Pulmonary vascularity normal. No visible pleural effusions. No pneumothorax. Degenerative changes and DISH involving the thoracic spine. Severe degenerative changes involving the LEFT shoulder. IMPRESSION: Stable cardiomegaly. No acute cardiopulmonary disease. Electronically Signed   By: Evangeline Dakin M.D.   On: 02/10/2018 20:46    EKG     EKG: EKG was personally reviewed and demonstrates: Sinus rhythm, rate 61 bpm, with no acute ischemic changes compared to prior tracings.    Telemetry: Telemetry was personally reviewed and demonstrates: Paced rhythm with heart rates currently in the 60's but has had multiple episodes in the 90's to 110's.  Cardiac Imaging    Left Heart Catheterization 09/22/2015:  Prox RCA to Mid RCA lesion, 100 %stenosed.  Ost 2nd Diag lesion, 100 %stenosed.  SVG and is normal in caliber and anatomically normal.  LM lesion, 60 %stenosed.  Ost Cx lesion, 50 %stenosed.  Ost 1st Mrg lesion, 100 %stenosed.  SVG graft was visualized by angiography and is normal in caliber and anatomically normal.  Prox LAD lesion, 100 %stenosed.  LIMA graft was visualized by angiography and is normal in caliber and anatomically normal.  1st Diag lesion, 90 %stenosed.   1. Severe triple vessel CAD s/p ?5V CABG with 3/5 patent bypass grafts. [place] state. The documentation in our record over the years states 5V CABG. No vein graft markers found today so I cannot exclude patency of 2 other vein grafts. Unable to identify vein grafts with aortic root  angiogram.  2. Occluded mid LAD with filling of the mid and distal vessel from the patent LIMA graft 3. Occluded intermediate and diagonal branches which fill from the patent vein grafts.  4. Cannot identify a graft to the Diagonal or the RCA 5. Moderate left main stenosis supplying the moderate caliber circumflex.  6. Dilatation of the aortic root.   Recommendations: No targets for PCI but cannot visualize 2 additional vein grafts that are reported in his medical record. Will arrange Coronary CTA to see if we can visualize additional bypass grafts and also to get a better estimate of the size of his aortic root.   Assessment & Plan    Chest Pain with History of CAD s/p CABG x5 in 2005 - Patient presents for evaluation of generalized weakness and body aches for the past 3 weeks. Also reports chronic intermittent chest pain with most recent episode being yesterday. Last cardiac catheterization in 2005 showed 3 patent grafts (the other 2 grafts could not be visualized) and a proximally occluded RCA with no graft to this vessel. - EKG showed no acute ischemic changes. - Troponin negative x3. - Continue Aspirin, Coreg, Imdur, and Ranexa.  - Chest pain is chronic and patient denies any increased frequency or severity recently. Patient biggest complaint is generalized weakness. Echo pending. If LV function normal, do not anticipate any further inpatient cardiac workup. May consider increasing dose of Ranexa or Imdur.  Paroxysmal Atrial Fibrillation - Currently in sinus rhythm  on telemetry. - Continue home Coreg. - Continue home Sotalol.  - On chronic anticoagulation with Coumadin at home. This was held on admission and patient was started on IV Heparin. INR 2.28 today. If echo is normal, suspect we can discontinue Heparin and restart Coumadin.  Hypertension - Most recent BP 94/72. - Continue home Coreg. - Will hold home Lisinopril at this time given soft BP.   Hyperlipidemia - Lipid panel  this admission: Cholesterol 183, Triglycerides 152, HDL 35, LDL 118. - Patient has Lipitor 40mg  listed under home meds but reportedly stopped taking this 1 week ago due to muscle aches.  - Consider trying Crestor or Zetia.  Hyperkalemia - Potassium 5.4 on admission. Received Kayexalate 30g. - Repeat potassium 4.5 this morning. - Will continue to monitor electrolytes.   Pre-Diabetes - Hgb A1c 6.2 this admission, consistent with pre-diabetes. - Will defer to primary team and PCP.   Signed, Darreld Mclean, PA-C 02/11/2018, 8:18 AM  For questions or updates, please contact   Please consult [email or web address] for contact info under Cardiology/STEMI.  Personally seen and examined. Agree with above.  78 year old with generalized malaise fatigue weakness over the past few weeks with chronic intermittent chest pain with last cardiac catheterization demonstrating 3 patent grafts out of 5 the other 2 grafts could not be adequately visualized with proximal occluded RCA and no PCI performed secondary to poor targets.  Currently comfortable in bed, no chest pain, no shortness of breath.  On exam: GEN: Well nourished, well developed, in no acute distress  HEENT: normal  Neck: no JVD, carotid bruits, or masses Cardiac: RRR; no murmurs, rubs, or gallops,no edema  Respiratory:  clear to auscultation bilaterally, normal work of breathing GI: soft, nontender, nondistended, + BS MS: no deformity or atrophy  Skin: warm and dry, no rash Neuro:  Alert and Oriented x 3, Strength and sensation are intact Psych: euthymic mood, full affect  Cardiac catheterization reviewed as above.-Medical management no PCI targets.  Intermittent chest discomfort with known CAD CABG times 05/2003 with 3 out of 5 patent bypass grafts -Continue with antianginal therapy.  Checking echocardiogram once again to evaluate left ventricular ejection fraction.   - Thankfully, troponins have been normal.  There is no evidence of  acute coronary syndrome present. - For now, continue with medical management.  Continue to consider other etiologies for chest discomfort including GI which is currently being treated with PPI.  We will resume diet.  Paroxysmal atrial fibrillation - Sotalol, Coreg, Coumadin, INR therapeutic.  If echo normal, discontinue heparin and start back Coumadin.  QTc 400.  Personally reviewed ECG.  No ischemic changes.  Permanent pacemaker -Currently AV paced.  Essential hypertension - Blood pressure under excellent control, slightly soft.  Holding home lisinopril because of this.  EF previously normal.  Hyperlipidemia - LDL 118, Lipitor 40 but reportedly stopped this because of muscle aches. - We will go ahead and try Crestor 20 mg instead.  Continue with investigation for weakness per primary team.  Addendum: Echocardiogram appears unchanged, EF is 65 to 70% with no wall motion abnormalities.  Grade 1 diastolic dysfunction to be expected.  Moderate aortic valve calcification with no evidence of stenosis.  Excellent.  From a cardiac perspective we are comfortable with discharge.  However, if generalized malaise and weakness needs to be continue to work-up please move forward.  Candee Furbish, MD

## 2018-02-11 NOTE — Discharge Instructions (Signed)
Rosuvastatin Tablets  What is this medicine?  ROSUVASTATIN (roe SOO va sta tin) is known as a HMG-CoA reductase inhibitor or 'statin'. It lowers cholesterol and triglycerides in the blood. This drug may also reduce the risk of heart attack, stroke, or other health problems in patients with risk factors for heart disease. Diet and lifestyle changes are often used with this drug.  This medicine may be used for other purposes; ask your health care provider or pharmacist if you have questions.  COMMON BRAND NAME(S): Crestor  What should I tell my health care provider before I take this medicine?  They need to know if you have any of these conditions:  -diabetes  -if you often drink alcohol  -history of stroke  -kidney disease  -liver disease  -muscle aches or weakness  -thyroid disease  -an unusual or allergic reaction to rosuvastatin, other medicines, foods, dyes, or preservatives  -pregnant or trying to get pregnant  -breast-feeding  How should I use this medicine?  Take this medicine by mouth with a glass of water. Follow the directions on the prescription label. Truth Barot not cut, crush or chew this medicine. You can take this medicine with or without food. Take your doses at regular intervals. Devaunte Gasparini not take your medicine more often than directed.  Talk to your pediatrician regarding the use of this medicine in children. While this drug may be prescribed for children as young as 7 years old for selected conditions, precautions Kiree Dejarnette apply.  Overdosage: If you think you have taken too much of this medicine contact a poison control center or emergency room at once.  NOTE: This medicine is only for you. Finley Chevez not share this medicine with others.  What if I miss a dose?  If you miss a dose, take it as soon as you can. If your next dose is to be taken in less than 12 hours, then Alanys Godino not take the missed dose. Take the next dose at your regular time. Kieron Kantner not take double or extra doses.  What may interact with this medicine?  Nyal Schachter not take  this medicine with any of the following medications:  -herbal medicines like red yeast rice  This medicine may also interact with the following medications:  -alcohol  -antacids containing aluminum hydroxide or magnesium hydroxide  -cyclosporine  -other medicines for high cholesterol  -some medicines for HIV infection  -warfarin  This list may not describe all possible interactions. Give your health care provider a list of all the medicines, herbs, non-prescription drugs, or dietary supplements you use. Also tell them if you smoke, drink alcohol, or use illegal drugs. Some items may interact with your medicine.  What should I watch for while using this medicine?  Visit your doctor or health care professional for regular check-ups. You may need regular tests to make sure your liver is working properly.  Your health care professional may tell you to stop taking this medicine if you develop muscle problems. If your muscle problems Esha Fincher not go away after stopping this medicine, contact your health care professional.  Elvera Almario not become pregnant while taking this medicine. Women should inform their health care professional if they wish to become pregnant or think they might be pregnant. There is a potential for serious side effects to an unborn child. Talk to your health care professional or pharmacist for more information. Karess Harner not breast-feed an infant while taking this medicine.  This medicine may affect blood sugar levels. If you have diabetes, check with   your doctor or health care professional before you change your diet or the dose of your diabetic medicine.  If you are going to need surgery or other procedure, tell your doctor that you are using this medicine.  This drug is only part of a total heart-health program. Your doctor or a dietician can suggest a low-cholesterol and low-fat diet to help. Avoid alcohol and smoking, and keep a proper exercise schedule.  This medicine may cause a decrease in Co-Enzyme Q-10. You  should make sure that you get enough Co-Enzyme Q-10 while you are taking this medicine. Discuss the foods you eat and the vitamins you take with your health care professional.  What side effects may I notice from receiving this medicine?  Side effects that you should report to your doctor or health care professional as soon as possible:  -allergic reactions like skin rash, itching or hives, swelling of the face, lips, or tongue  -dark urine  -fever  -joint pain  -muscle cramps, pain  -redness, blistering, peeling or loosening of the skin, including inside the mouth  -trouble passing urine or change in the amount of urine  -unusually weak or tired  -yellowing of the eyes or skin  Side effects that usually Dexter Signor not require medical attention (report to your doctor or health care professional if they continue or are bothersome):  -constipation  -heartburn  -nausea  -stomach gas, pain, upset  This list may not describe all possible side effects. Call your doctor for medical advice about side effects. You may report side effects to FDA at 1-800-FDA-1088.  Where should I keep my medicine?  Keep out of the reach of children.  Store at room temperature between 20 and 25 degrees C (68 and 77 degrees F). Keep container tightly closed (protect from moisture). Throw away any unused medicine after the expiration date.  NOTE: This sheet is a summary. It may not cover all possible information. If you have questions about this medicine, talk to your doctor, pharmacist, or health care provider.   2019 Elsevier/Gold Standard (2016-09-19 12:42:43)

## 2018-02-11 NOTE — Discharge Summary (Signed)
Physician Discharge Summary  Dylan Roth GHW:[id number] DOB: [birth year] DOA: 02/10/2018  PCP: Verdell Carmine., MD  Admit date: 02/10/2018 Discharge date: 02/11/2018  Time spent: 45 minutes  Recommendations for Outpatient Follow-up:  1. Follow up with PCP 1-2 weeks for evaluation of BP control, kidney function. Consider referral to GI for possible egd work up gerd/reflux     Discharge Diagnoses:  Principal Problem:   Chest pain Active Problems:   HYPERCHOLESTEROLEMIA   Essential hypertension, benign   Atrial fibrillation/flutter   Pacemaker-[employer]   HTN (hypertension)   Gout   CAD (coronary artery disease)   Hx of CABG   Hyperkalemia   Discharge Condition: stable  Diet recommendation: heart healthy  Filed Weights   02/11/18 0405  Weight: 83.7 kg    History of present illness:  78 yo admitted 02/10/18 with "flu like" symptoms including generalized weakness and chest pain. He reports 3 weeks generalized weakness. Assoc symptoms include sob and nausea without vomiting. OP work up negative. Has been taking nyquil. Also reports worsening reflux with decreased oral intake.   Hospital Course:  Intermittent chest discomfort with known CAD CABG times 05/2003 with 3 out of 5 patent bypass grafts. Evaluated by cardiology who opine the following: -Continue with antianginal therapy. Troponins have been normal.  There is no evidence of acute coronary syndrome present. Echo with EF 65% with no wall motion abnormalities, grade 1 DD. Recommended continuing with medical management.    Paroxysmal atrial fibrillation - Sotalol, Coreg, Coumadin, INR therapeutic. QTc 400.  see #1. Cards recommended resuming med  Permanent pacemaker -Currently AV paced.  Essential hypertension - Blood pressure  slightly soft. Stop home lisinopril because of this.  EF previously normal. Recommend close monitoring of BP OP.   Hyperlipidemia - LDL 118, Lipitor 40 but reportedly stopped this because  of muscle aches. - Crestor 20 mg started per cards   Procedures:  echo  Consultations:  Dr Marlou Porch cards  Discharge Exam: Vitals:   02/11/18 7893 02/11/18 1239  BP: 116/77 118/70  Pulse:    Resp:    Temp:    SpO2:      General: awake alert no acute distress Cardiovascular: irregularly irregular no mgr no LE edema Respiratory: normal effort BS clear bilaterally no wheeze  Discharge Instructions   Discharge Instructions    Call MD for:  persistant nausea and vomiting   Complete by:  As directed    Diet - low sodium heart healthy   Complete by:  As directed    Discharge instructions   Complete by:  As directed    Take medications as prescribed Follow up with PCP 1-2 weeks evaluation of reflux. Consider referral to GI for endoscopy.   Increase activity slowly   Complete by:  As directed      Allergies as of 02/11/2018      Reactions   Sulfa Antibiotics Other (See Comments)   SWELLING ON PATIENTS LIPS, HE GETS DRIED UP   Sulfamethoxazole Other (See Comments)   Unknown   Sulfamethoxazole-trimethoprim Other (See Comments)   Unknown      Medication List    STOP taking these medications   atorvastatin 40 MG tablet Commonly known as:  LIPITOR   lisinopril 20 MG tablet Commonly known as:  PRINIVIL,ZESTRIL     TAKE these medications   allopurinol 300 MG tablet Commonly known as:  ZYLOPRIM Take 300 mg by mouth every morning.   carvedilol 3.125 MG tablet Commonly known as:  COREG Take  1 tablet (3.125 mg total) by mouth 2 (two) times daily with a meal.   guaiFENesin 600 MG 12 hr tablet Commonly known as:  MUCINEX Take 600 mg by mouth every other day.   isosorbide mononitrate 30 MG 24 hr tablet Commonly known as:  IMDUR Take 1 tablet (30 mg total) by mouth 2 (two) times daily.   ketorolac 0.5 % ophthalmic solution Commonly known as:  ACULAR Place 1 drop into the right eye 2 (two) times daily.   nitroGLYCERIN 0.4 MG SL tablet Commonly known as:   NITROSTAT DISSOLVE ONE TABLET UNDER THE TONGUE EVERY 5 MINUTES AS NEEDED FOR CHEST PAIN.  DO NOT EXCEED A TOTAL OF 3 DOSES IN 15 MINUTES What changed:  See the new instructions.   pantoprazole 40 MG tablet Commonly known as:  PROTONIX Take 1 tablet (40 mg total) by mouth 2 (two) times daily. What changed:  when to take this   ranolazine 500 MG 12 hr tablet Commonly known as:  RANEXA TAKE 1 TABLET TWICE DAILY   rosuvastatin 20 MG tablet Commonly known as:  CRESTOR Take 1 tablet (20 mg total) by mouth daily at 6 PM.   sotalol 120 MG tablet Commonly known as:  BETAPACE Take 1 tablet (120 mg total) by mouth every 12 (twelve) hours.   vitamin B-12 1000 MCG tablet Commonly known as:  CYANOCOBALAMIN Take 1,000 mcg by mouth at bedtime.   VITAMIN D PO Take 3,000 Units by mouth daily.   warfarin 5 MG tablet Commonly known as:  COUMADIN Take as directed. If you are unsure how to take this medication, talk to your nurse or doctor. Original instructions:  TAKE 1 TABLET AS DIRECTED OR AS DIRECTED BY THE COUMADIN CLINIC What changed:    how much to take  how to take this  when to take this  additional instructions      Allergies  Allergen Reactions  . Sulfa Antibiotics Other (See Comments)    SWELLING ON PATIENTS LIPS, HE GETS DRIED UP  . Sulfamethoxazole Other (See Comments)    Unknown  . Sulfamethoxazole-Trimethoprim Other (See Comments)    Unknown      The results of significant diagnostics from this hospitalization (including imaging, microbiology, ancillary and laboratory) are listed below for reference.    Significant Diagnostic Studies: Dg Chest 2 View  Result Date: 02/10/2018 CLINICAL DATA:  78 year old two-day history of chest pain, generalized weakness and shortness of breath on exertion. EXAM: CHEST - 2 VIEW COMPARISON:  12/23/2013 and earlier. FINDINGS: Prior sternotomy for CABG. Cardiac silhouette moderately enlarged, unchanged. LEFT subclavian dual lead  transvenous pacemaker lead tips at the expected location of the RIGHT atrial appendage and RV apex, unchanged. Lungs clear. Bronchovascular markings normal. Pulmonary vascularity normal. No visible pleural effusions. No pneumothorax. Degenerative changes and DISH involving the thoracic spine. Severe degenerative changes involving the LEFT shoulder. IMPRESSION: Stable cardiomegaly. No acute cardiopulmonary disease. Electronically Signed   By: Evangeline Dakin M.D.   On: 02/10/2018 20:46    Microbiology: No results found for this or any previous visit (from the past 240 hour(s)).   Labs: Basic Metabolic Panel: Recent Labs  Lab 02/10/18 1951 02/11/18 0355  NA 134* 134*  K 5.4* 4.5  CL 102 103  CO2 25 23  GLUCOSE 106* 98  BUN 27* 24*  CREATININE 1.17 1.12  CALCIUM 8.7* 8.3*   Liver Function Tests: Recent Labs  Lab 02/10/18 1951  AST 24  ALT 30  ALKPHOS 58  BILITOT  0.9  PROT 6.1*  ALBUMIN 3.1*   No results for input(s): LIPASE, AMYLASE in the last 168 hours. No results for input(s): AMMONIA in the last 168 hours. CBC: Recent Labs  Lab 02/10/18 1951 02/11/18 0355  WBC 8.4 6.6  NEUTROABS 6.1  --   HGB 13.7 13.0  HCT 41.4 37.6*  MCV 101.0* 100.3*  PLT 199 198   Cardiac Enzymes: Recent Labs  Lab 02/11/18 0014 02/11/18 0355 02/11/18 1006  TROPONINI <0.03 <0.03 <0.03   BNP: BNP (last 3 results) Recent Labs    02/10/18 1951  BNP 141.6*    ProBNP (last 3 results) No results for input(s): PROBNP in the last 8760 hours.  CBG: No results for input(s): GLUCAP in the last 168 hours.     Signed:  Radene Gunning NP.  [facility] 02/11/2018, 2:37 PM

## 2018-02-11 NOTE — Progress Notes (Signed)
  Echocardiogram 2D Echocardiogram has been performed.  Dylan Roth 02/11/2018, 8:53 AM

## 2018-02-11 NOTE — Progress Notes (Signed)
Pt stable. D/C home with wife via wheelchair

## 2018-02-12 ENCOUNTER — Ambulatory Visit: Payer: Medicare HMO | Admitting: Physician Assistant

## 2018-02-14 ENCOUNTER — Encounter: Payer: Self-pay | Admitting: Gastroenterology

## 2018-02-15 ENCOUNTER — Other Ambulatory Visit: Payer: Self-pay | Admitting: Cardiovascular Disease

## 2018-02-18 ENCOUNTER — Ambulatory Visit (INDEPENDENT_AMBULATORY_CARE_PROVIDER_SITE_OTHER): Payer: Medicare HMO

## 2018-02-18 DIAGNOSIS — I495 Sick sinus syndrome: Secondary | ICD-10-CM

## 2018-02-19 ENCOUNTER — Encounter: Payer: Self-pay | Admitting: Cardiology

## 2018-02-19 LAB — CUP PACEART REMOTE DEVICE CHECK
Battery Remaining Longevity: 76 mo
Battery Remaining Percentage: 57 %
Battery Voltage: 2.89 V
Brady Statistic AP VS Percent: 77 %
Brady Statistic AS VP Percent: 1 %
Brady Statistic AS VS Percent: 4.6 %
Brady Statistic RA Percent Paced: 94 %
Brady Statistic RV Percent Paced: 19 %
Date Time Interrogation Session: 20200120140942
Implantable Lead Implant Date: 20130425
Implantable Lead Implant Date: 20130425
Implantable Lead Location: 753859
Implantable Lead Location: 753860
Lead Channel Impedance Value: 460 Ohm
Lead Channel Pacing Threshold Amplitude: 0.625 V
Lead Channel Pacing Threshold Amplitude: 1 V
Lead Channel Pacing Threshold Pulse Width: 0.4 ms
Lead Channel Pacing Threshold Pulse Width: 0.4 ms
Lead Channel Sensing Intrinsic Amplitude: 3.1 mV
Lead Channel Sensing Intrinsic Amplitude: 3.6 mV
Lead Channel Setting Pacing Amplitude: 1.25 V
Lead Channel Setting Pacing Amplitude: 1.625
Lead Channel Setting Pacing Pulse Width: 0.4 ms
Lead Channel Setting Sensing Sensitivity: 2 mV
MDC IDC MSMT LEADCHNL RV IMPEDANCE VALUE: 440 Ohm
MDC IDC PG IMPLANT DT: 20130425
MDC IDC STAT BRADY AP VP PERCENT: 18 %
Pulse Gen Model: 2110
Pulse Gen Serial Number: 7322329

## 2018-02-19 NOTE — Progress Notes (Signed)
Remote pacemaker transmission.   

## 2018-02-22 ENCOUNTER — Ambulatory Visit (INDEPENDENT_AMBULATORY_CARE_PROVIDER_SITE_OTHER): Payer: Medicare HMO | Admitting: Pharmacist

## 2018-02-22 DIAGNOSIS — I4891 Unspecified atrial fibrillation: Secondary | ICD-10-CM

## 2018-02-22 LAB — POCT INR: INR: 4.2 — AB (ref 2.0–3.0)

## 2018-02-22 NOTE — Patient Instructions (Addendum)
Skip your Coumadin dose today and tomorrow, then start taking 1/2 tablet daily. Recheck in 2 weeks. [facility] [phone]

## 2018-03-01 ENCOUNTER — Telehealth: Payer: Self-pay

## 2018-03-01 ENCOUNTER — Encounter: Payer: Self-pay | Admitting: Gastroenterology

## 2018-03-01 ENCOUNTER — Ambulatory Visit: Payer: Medicare HMO | Admitting: Gastroenterology

## 2018-03-01 VITALS — BP 122/70 | HR 68 | Ht 66.0 in | Wt 186.0 lb

## 2018-03-01 DIAGNOSIS — K219 Gastro-esophageal reflux disease without esophagitis: Secondary | ICD-10-CM | POA: Diagnosis not present

## 2018-03-01 DIAGNOSIS — Z86010 Personal history of colonic polyps: Secondary | ICD-10-CM | POA: Diagnosis not present

## 2018-03-01 MED ORDER — PANTOPRAZOLE SODIUM 40 MG PO TBEC: 40.0000 mg | DELAYED_RELEASE_TABLET | Freq: Two times a day (BID) | ORAL | 11 refills | Status: DC

## 2018-03-01 NOTE — Progress Notes (Signed)
Chief Complaint: FU from hospitalization d/t non cardiac chest pain  Referring Provider:  Verdell Carmine., MD      ASSESSMENT AND PLAN;   #1. NCCP/GERD  #2. H/O polyps (tubular adenomas 01/2012)  Plan: - Proceed with EGD and colonoscopy.  I have discussed the risks & benefits.  The risks including risk of perforation requiring laparotomy, bleeding after biopsies/polypectomy requiring blood transfusion and risks of anesthesia/sedation were discussed.  Rare risks of missing UGI and colorectal neoplasms were also discussed.  Consent forms given for review. - Hold coumadin x 5 days before. - Needs cardiology clearence prior for endoscopic procedures and to hold Coumadin. - Protinix 40mg  po bid continue for now.  May reduce it to once a day after EGD. - D/w patient and patient's wife.   HPI:    Dylan Roth is a 78 y.o. male  Had flu 12/2017 - tool lot of nyquil -now not much of a taste. Had chest pains, negative cardiac evaluation including 2D echo. Dx with noncardiac chest pains likely due to reflux.  He has been started on Protonix 40 mg p.o. twice daily with good relief.  Advised to get EGD performed.  Denies having any nausea, vomiting, heartburn, regurgitation, odynophagia or dysphagia.  Denies having any abdominal pain.  No significant diarrhea or constipation.  Does complain of occasional hoarseness.  He has history of colonic polyps and is due for colonoscopy as well.  Takes Coumadin for atrial fibrillation.  Past GI procedures: -Colonoscopy 02/29/2012 (Adult)-colonic polyps status post polypectomy, pancolonic diverticulosis predominantly in the sigmoid colon.  Negative random colonic biopsies for microscopic colitis. Past Medical History:  Diagnosis Date  . Angina   . Arthritis    "in my back"  . Atrial fibrillation/flutter   . BPH (benign prostatic hyperplasia)   . CAD (coronary artery disease) Dec 2005   Hx MI. 80% left main, 80% LAD, 90% ramus, 50% circ,  90% in nondominant RCA,  EF normal  2010--echo  . Central obesity   . Chronic back pain   . Erectile dysfunction   . Gout    "once; I have it under control w/medicine"  . History of anemia   . History of colon polyps   . Hyperlipidemia   . Hypertension   . IBS (irritable bowel syndrome)    with diarrhea  . Pacemaker   . Palpitations   . Sinus node dysfunction (HCC)    post termination pauses <5sec    Past Surgical History:  Procedure Laterality Date  . CARDIAC CATHETERIZATION  2005   "that what sent me to CABG"  . CARDIAC CATHETERIZATION N/A 09/22/2015   Procedure: Left Heart Cath and Cors/Grafts Angiography;  Surgeon: Burnell Blanks, MD;  Location: [place] [facility];  Service: Cardiovascular;  Laterality: N/A;  . CATARACT EXTRACTION W/ INTRAOCULAR LENS  IMPLANT, BILATERAL  ~ 01/2011  . CHOLECYSTECTOMY  1978   . COLONOSCOPY  2014   (Polypectomy) colonic polyps status post polypectomy. Pancolonic diverticulosis predominantly in the sigmoid colon. Small internal hemorrhoids. Patient also had perirectal excoriation  . CORONARY ARTERY BYPASS GRAFT  2005   in [place] - [place] to LAD, SVG to diagonal, SVG to ramus, SVG to OM and SVG to RCA   . PACEMAKER INSERTION  2014  . PERMANENT PACEMAKER INSERTION N/A 05/25/2011   Procedure: PERMANENT PACEMAKER INSERTION;  Surgeon: [name], MD;  Location: [facility];  Service: Cardiovascular;  Laterality: N/A;  . TRANSURETHRAL RESECTION OF PROSTATE  N/A 12/29/2013   Procedure: TRANSURETHRAL RESECTION OF THE PROSTATE WITH GYRUS INSTRUMENTS;  Surgeon: [name], MD;  Location: [facility];  Service: Urology;  Laterality: N/A;    Family History  Problem Relation Age of Onset  . Cancer Mother   . Stroke Brother   . Heart attack Neg Hx   . Colon cancer Neg Hx     Social History   Tobacco Use  . Smoking status: Former Smoker    Packs/day: 0.00    Years: 0.00    Pack years: 0.00    Types: Cigarettes    Last attempt to quit:  01/30/1961    Years since quitting: 57.1  . Smokeless tobacco: Never Used  Substance Use Topics  . Alcohol use: No    Comment: 2 drinks (bourbon) a night  . Drug use: No    Current Outpatient Medications  Medication Sig Dispense Refill  . allopurinol (ZYLOPRIM) 300 MG tablet Take 300 mg by mouth every morning.     . carvedilol (COREG) 3.125 MG tablet TAKE 1 TABLET (3.125 MG TOTAL) BY MOUTH 2 (TWO) TIMES DAILY WITH A MEAL. 180 tablet 2  . Cholecalciferol (VITAMIN D PO) Take 3,000 Units by mouth daily.     Marland Kitchen guaiFENesin (MUCINEX) 600 MG 12 hr tablet Take 600 mg by mouth every other day.    . isosorbide mononitrate (IMDUR) 30 MG 24 hr tablet Take 1 tablet (30 mg total) by mouth 2 (two) times daily. 180 tablet 3  . ketorolac (ACULAR) 0.5 % ophthalmic solution Place 1 drop into the right eye 2 (two) times daily.  0  . nitroGLYCERIN (NITROSTAT) 0.4 MG SL tablet DISSOLVE ONE TABLET UNDER THE TONGUE EVERY 5 MINUTES AS NEEDED FOR CHEST PAIN.  DO NOT EXCEED A TOTAL OF 3 DOSES IN 15 MINUTES (Patient taking differently: Place 0.4 mg under the tongue every 5 (five) minutes as needed for chest pain. ) 25 tablet 5  . pantoprazole (PROTONIX) 40 MG tablet Take 1 tablet (40 mg total) by mouth 2 (two) times daily. 60 tablet 1  . ranolazine (RANEXA) 500 MG 12 hr tablet TAKE 1 TABLET TWICE DAILY (Patient taking differently: Take 500 mg by mouth 2 (two) times daily. ) 180 tablet 2  . rosuvastatin (CRESTOR) 20 MG tablet Take 1 tablet (20 mg total) by mouth daily at 6 PM. 30 tablet 1  . sotalol (BETAPACE) 120 MG tablet Take 1 tablet (120 mg total) by mouth every 12 (twelve) hours. 180 tablet 3  . vitamin B-12 (CYANOCOBALAMIN) 1000 MCG tablet Take 1,000 mcg by mouth at bedtime.    Marland Kitchen warfarin (COUMADIN) 5 MG tablet TAKE 1 TABLET AS DIRECTED OR AS DIRECTED BY THE COUMADIN CLINIC (Patient taking differently: Take 2.5-5 mg by mouth See admin instructions. Take 1 tablet (5mg ) on Monday, and 1/2 tablet (2.5mg ) all other days)  90 tablet 1   No current facility-administered medications for this visit.     Allergies  Allergen Reactions  . Sulfa Antibiotics Other (See Comments)    SWELLING ON PATIENTS LIPS, HE GETS DRIED UP  . Sulfamethoxazole Other (See Comments)    Unknown  . Sulfamethoxazole-Trimethoprim Other (See Comments)    Unknown    Review of Systems:  Constitutional: Denies fever, chills, diaphoresis, appetite change and fatigue.  HEENT: Denies photophobia, eye pain, redness, hearing loss, ear pain, congestion, sore throat, rhinorrhea, sneezing, mouth sores, neck pain, neck stiffness and tinnitus.   Respiratory: Denies SOB, DOE, cough, chest tightness,  and  wheezing.   Cardiovascular: Denies chest pain, palpitations and leg swelling.  Genitourinary: Denies dysuria, urgency, frequency, hematuria, flank pain and difficulty urinating.  Musculoskeletal: Denies myalgias, back pain, joint swelling, arthralgias and gait problem.  Skin: No rash.  Neurological: Denies dizziness, seizures, syncope, weakness, light-headedness, numbness and headaches.  Hematological: Denies adenopathy. Easy bruising, personal or family bleeding history  Psychiatric/Behavioral: No anxiety or depression     Physical Exam:    BP 122/70   Pulse 68   Ht 5\' 6"  (1.676 m)   Wt 186 lb (84.4 kg)   BMI 30.02 kg/m  Filed Weights   03/01/18 0951  Weight: 186 lb (84.4 kg)   Constitutional:  Well-developed, in no acute distress. Psychiatric: Normal mood and affect. Behavior is normal. HEENT: Pupils normal.  Conjunctivae are normal. No scleral icterus. Neck supple.  Cardiovascular: Normal rate, regular rhythm. No edema Pulmonary/chest: Effort normal and breath sounds normal. No wheezing, rales or rhonchi. Abdominal: Soft, nondistended. Nontender. Bowel sounds active throughout. There are no masses palpable.  Well-healed surgical scars. Rectal:  defered Neurological: Alert and oriented to person place and time. Skin: Skin is  warm and dry. No rashes noted.  Data Reviewed: I have personally reviewed following labs and imaging studies  CBC: CBC Latest Ref Rng & Units 02/11/2018 02/10/2018 09/25/2017  WBC 4.0 - 10.5 K/uL 6.6 8.4 7.5  Hemoglobin 13.0 - 17.0 g/dL 13.0 13.7 14.8  Hematocrit 39.0 - 52.0 % 37.6(L) 41.4 43.0  Platelets 150 - 400 K/uL 198 199 207    CMP: CMP Latest Ref Rng & Units 02/11/2018 02/10/2018 09/25/2017  Glucose 70 - 99 mg/dL 98 106(H) 79  BUN 8 - 23 mg/dL 24(H) 27(H) 18  Creatinine 0.61 - 1.24 mg/dL 1.12 1.17 1.07  Sodium 135 - 145 mmol/L 134(L) 134(L) 139  Potassium 3.5 - 5.1 mmol/L 4.5 5.4(H) 4.9  Chloride 98 - 111 mmol/L 103 102 100  CO2 22 - 32 mmol/L 23 25 25   Calcium 8.9 - 10.3 mg/dL 8.3(L) 8.7(L) 9.3  Total Protein 6.5 - 8.1 g/dL - 6.1(L) -  Total Bilirubin 0.3 - 1.2 mg/dL - 0.9 -  Alkaline Phos 38 - 126 U/L - 58 -  AST 15 - 41 U/L - 24 -  ALT 0 - 44 U/L - 30 -   Coagulation Profile: Recent Labs  Lab 02/22/18 1010  INR 4.2*     Radiology Studies: Dg Chest 2 View  Result Date: 02/10/2018 CLINICAL DATA:  78 year old two-day history of chest pain, generalized weakness and shortness of breath on exertion. EXAM: CHEST - 2 VIEW COMPARISON:  12/23/2013 and earlier. FINDINGS: Prior sternotomy for CABG. Cardiac silhouette moderately enlarged, unchanged. LEFT subclavian dual lead transvenous pacemaker lead tips at the expected location of the RIGHT atrial appendage and RV apex, unchanged. Lungs clear. Bronchovascular markings normal. Pulmonary vascularity normal. No visible pleural effusions. No pneumothorax. Degenerative changes and DISH involving the thoracic spine. Severe degenerative changes involving the LEFT shoulder. IMPRESSION: Stable cardiomegaly. No acute cardiopulmonary disease. Electronically Signed   By: Evangeline Dakin M.D.   On: 02/10/2018 20:46  25 minutes spent with the patient today. Greater than 50% was spent in counseling and coordination of care with the  patient     Carmell Austria, MD 03/01/2018, 9:58 AM  Cc: Verdell Carmine., MD

## 2018-03-01 NOTE — Telephone Encounter (Signed)
[facility] Pre-operative Risk Assessment     Request for surgical clearance:     Endoscopy Procedure  What type of surgery is being performed?     EGD  When is this surgery scheduled?     03/2018  What type of clearance is required ?   Pharmacy  Are there any medications that need to be held prior to surgery and how long? 5 days Coumadin  Practice name and name of physician performing surgery?      [facility]  What is your office phone and fax number?      Phone- [phone]  [phone]  Anesthesia type (None, local, MAC, general) ?       MAC

## 2018-03-01 NOTE — Telephone Encounter (Signed)
  Pt is s/p recent hospitalization for CP. He has known CAD. He is scheduled for post hospital f/u with one of our providers next week on 03/08/18. He will be assessed at that visit for clearance to undergo colonoscopy. In the meantime, I will also route to pharmacy to address anticoagulation.

## 2018-03-01 NOTE — Patient Instructions (Signed)
If you are age 77 or older, your body mass index should be between 23-30. Your Body mass index is 30.02 kg/m. If this is out of the aforementioned range listed, please consider follow up with your Primary Care Provider.  If you are age 48 or younger, your body mass index should be between 19-25. Your Body mass index is 30.02 kg/m. If this is out of the aformentioned range listed, please consider follow up with your Primary Care Provider.    It has been recommended to you by your physician that you have a(n) Colonoscopy/EGD completed. Per your request, we did not schedule the procedure(s) today. Please contact our office at [phone] should you decide to have the procedure completed.   You will be contacted by our office prior to your procedure for directions on holding your Coumadin.  If you do not hear from our office 1 week prior to your scheduled procedure, please call [phone] to discuss.    We have sent the following medications to your pharmacy for you to pick up at your convenience: Protonix   Thank you,  Dr. Jackquline Denmark

## 2018-03-01 NOTE — Telephone Encounter (Signed)
Patient with diagnosis of atrial fibrillation on warfarin for anticoagulation.    Procedure: endoscopy Date of procedure: 03/2018  CHADS2-VASc score of  4 (, HTN, AGE, , CAD, AGE,)  CrCl 67 Platelet count 198  Per office protocol, patient can hold warfarin for 5 days prior to procedure.   Patient will not need bridging with Lovenox (enoxaparin) around procedure.  Patient should restart warfarin on the evening of procedure or day after, at discretion of procedure MD

## 2018-03-08 ENCOUNTER — Ambulatory Visit: Payer: Medicare HMO | Admitting: Cardiology

## 2018-03-08 ENCOUNTER — Telehealth: Payer: Self-pay

## 2018-03-08 ENCOUNTER — Encounter: Payer: Self-pay | Admitting: Cardiology

## 2018-03-08 ENCOUNTER — Ambulatory Visit (INDEPENDENT_AMBULATORY_CARE_PROVIDER_SITE_OTHER): Payer: Medicare HMO | Admitting: *Deleted

## 2018-03-08 VITALS — BP 130/82 | HR 77 | Ht 66.0 in | Wt 188.4 lb

## 2018-03-08 DIAGNOSIS — Z01810 Encounter for preprocedural cardiovascular examination: Secondary | ICD-10-CM | POA: Diagnosis not present

## 2018-03-08 DIAGNOSIS — I10 Essential (primary) hypertension: Secondary | ICD-10-CM | POA: Diagnosis not present

## 2018-03-08 DIAGNOSIS — Z950 Presence of cardiac pacemaker: Secondary | ICD-10-CM

## 2018-03-08 DIAGNOSIS — E7800 Pure hypercholesterolemia, unspecified: Secondary | ICD-10-CM

## 2018-03-08 DIAGNOSIS — I480 Paroxysmal atrial fibrillation: Secondary | ICD-10-CM

## 2018-03-08 DIAGNOSIS — I4891 Unspecified atrial fibrillation: Secondary | ICD-10-CM | POA: Diagnosis not present

## 2018-03-08 DIAGNOSIS — I2510 Atherosclerotic heart disease of native coronary artery without angina pectoris: Secondary | ICD-10-CM | POA: Diagnosis not present

## 2018-03-08 LAB — POCT INR: INR: 2.6 (ref 2.0–3.0)

## 2018-03-08 MED ORDER — APIXABAN 5 MG PO TABS: 5.0000 mg | ORAL_TABLET | Freq: Two times a day (BID) | ORAL | 0 refills | Status: DC

## 2018-03-08 NOTE — Progress Notes (Signed)
Cardiology Office Note:    Date:  03/08/2018   ID:  Dylan Roth, DOB 1940/05/06, MRN [id number]  PCP:  Verdell Carmine., MD  Cardiologist:  Lauree Chandler, MD  Referring MD: Verdell Carmine., MD  weakness Chief Complaint  Patient presents with  . Hospitalization Follow-up    chest pain, weakness  . Pre-op Exam    History of Present Illness:    Dylan Roth is a 78 y.o. 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 Angelena Form and Dr. Caryl Comes. Last cardiac catheterization in 08/2015 showed 3 patent grafts (the other 2 grafts could not be visualized) and a proximally occluded RCA with no graft to this vessel. He was last seen by Dr. Angelena Form for follow-up in 10/2017 at which time he was doing well.  Patient presented to the [facility] ED on 02/10/2018 for evaluation of chest pain and weakness.  He reported flulike symptoms for the prior 3 weeks with body aches and generalized weakness.  He denied any fever.  He had been evaluated multiple times at an urgent care was reported negative flu test.  Seen by his PCP who felt that his symptoms may represent GERD.  He developed worsening weakness and mild nonradiating left-sided chest pain that resolved with sublingual nitroglycerin, although he felt that it likely would have resolved without the nitroglycerin.  He was seen in consultation in the hospital by Dr. Marlou Porch.  Troponins remained negative.  Echocardiogram showed normal EF with no regional wall motion abnormalities.  His symptoms were felt to be noncardiac and no further cardiac work-up was felt to be necessary at that time.  There was a question of possible GERD and he has been referred to gastroenterology who have recommended an EGD and colonoscopy.  They request to hold Coumadin for 5 days  prior to procedure.  The patient was hyperkalemic while in the hospital and given Kayexalate.  Low up labs at his PCP revealed normal potassium.  He does a lot of work with Scientist, research (life sciences) and chainsaw and always has chest pain after that, possibly muscle soreness. He has a fallen tree right now that he plans to work on. He says he had the flu in in December and he has not gotten his strength back. He is having his chronic chest pain that has not changed in many years. He has no shortness of breath with exertion. It is the lack of energy and having to stop during exertion that is bothering him. He can do about 20 minutes of activity before having to sit down and rest. He has been told recently that his B12 is low and he has started taking B12 supplement.   He says that neither ranexa nor imdur has helped his chest pain. And SL NTG does not have any effect. He thinks that his pain is mostly musculoskeletal.   He would like to switch to Eliquis. He says that he followed up with his PCP after hospital and labs were done with everything being normal.   Past Medical History:  Diagnosis Date  . Angina   . Arthritis    "in my back"  . Atrial fibrillation/flutter   . BPH (benign prostatic hyperplasia)   . CAD (coronary artery disease) Dec 2005   Hx MI. 80% left main, 80% LAD, 90% ramus, 50% circ, 90% in nondominant  RCA,  EF normal  2010--echo  . Central obesity   . Chronic back pain   . Erectile dysfunction   . Gout    "once; I have it under control w/medicine"  . History of anemia   . History of colon polyps   . Hyperlipidemia   . Hypertension   . IBS (irritable bowel syndrome)    with diarrhea  . Pacemaker   . Palpitations   . Sinus node dysfunction (HCC)    post termination pauses <5sec    Past Surgical History:  Procedure Laterality Date  . CARDIAC CATHETERIZATION  2005   "that what sent me to CABG"  . CARDIAC CATHETERIZATION N/A 09/22/2015   Procedure: Left Heart Cath and Cors/Grafts  Angiography;  Surgeon: Burnell Blanks, MD;  Location: [facility];  Service: Cardiovascular;  Laterality: N/A;  . CATARACT EXTRACTION W/ INTRAOCULAR LENS  IMPLANT, BILATERAL  ~ 01/2011  . CHOLECYSTECTOMY  1978   . COLONOSCOPY  2014   (Polypectomy) colonic polyps status post polypectomy. Pancolonic diverticulosis predominantly in the sigmoid colon. Small internal hemorrhoids. Patient also had perirectal excoriation  . CORONARY ARTERY BYPASS GRAFT  2005   in [place] - LIMA to LAD, SVG to diagonal, SVG to ramus, SVG to OM and SVG to RCA   . PACEMAKER INSERTION  2014  . PERMANENT PACEMAKER INSERTION N/A 05/25/2011   Procedure: PERMANENT PACEMAKER INSERTION;  Surgeon: [name], MD;  Location: [facility];  Service: Cardiovascular;  Laterality: N/A;  . TRANSURETHRAL RESECTION OF PROSTATE N/A 12/29/2013   Procedure: TRANSURETHRAL RESECTION OF THE PROSTATE WITH GYRUS INSTRUMENTS;  Surgeon: [name], MD;  Location: [facility];  Service: Urology;  Laterality: N/A;    Current Medications: Current Meds  Medication Sig  . allopurinol (ZYLOPRIM) 300 MG tablet Take 300 mg by mouth every morning.   . carvedilol (COREG) 3.125 MG tablet TAKE 1 TABLET (3.125 MG TOTAL) BY MOUTH 2 (TWO) TIMES DAILY WITH A MEAL.  Marland Kitchen Cholecalciferol (VITAMIN D PO) Take 3,000 Units by mouth daily.   . isosorbide mononitrate (IMDUR) 30 MG 24 hr tablet Take 1 tablet (30 mg total) by mouth 2 (two) times daily.  Marland Kitchen ketorolac (ACULAR) 0.5 % ophthalmic solution Place 1 drop into the right eye 2 (two) times daily.  . nitroGLYCERIN (NITROSTAT) 0.4 MG SL tablet DISSOLVE ONE TABLET UNDER THE TONGUE EVERY 5 MINUTES AS NEEDED FOR CHEST PAIN.  DO NOT EXCEED A TOTAL OF 3 DOSES IN 15 MINUTES (Patient taking differently: Place 0.4 mg under the tongue every 5 (five) minutes as needed for chest pain. )  . pantoprazole (PROTONIX) 40 MG tablet Take 1 tablet (40 mg total) by mouth 2 (two) times daily.  . ranolazine (RANEXA) 500  MG 12 hr tablet TAKE 1 TABLET TWICE DAILY (Patient taking differently: Take 500 mg by mouth 2 (two) times daily. )  . rosuvastatin (CRESTOR) 20 MG tablet Take 1 tablet (20 mg total) by mouth daily at 6 PM.  . sotalol (BETAPACE) 120 MG tablet Take 1 tablet (120 mg total) by mouth every 12 (twelve) hours.  . vitamin B-12 (CYANOCOBALAMIN) 1000 MCG tablet Take 1,000 mcg by mouth at bedtime.  . [DISCONTINUED] warfarin (COUMADIN) 5 MG tablet TAKE 1 TABLET AS DIRECTED OR AS DIRECTED BY THE COUMADIN CLINIC (Patient taking differently: Take 2.5-5 mg by mouth See admin instructions. Take 1 tablet (5mg ) on Monday, and 1/2 tablet (2.5mg ) all other days)     Allergies:   Sulfa  antibiotics; Sulfamethoxazole; and Sulfamethoxazole-trimethoprim   Social History   Socioeconomic History  . Marital status: Married    Spouse name: Not on file  . Number of children: 2  . Years of education: Not on file  . Highest education level: Not on file  Occupational History  . Occupation: retired-truck Education administrator: RETIRED  Social Needs  . Financial resource strain: Not on file  . Food insecurity:    Worry: Not on file    Inability: Not on file  . Transportation needs:    Medical: Not on file    Non-medical: Not on file  Tobacco Use  . Smoking status: Former Smoker    Packs/day: 0.00    Years: 0.00    Pack years: 0.00    Types: Cigarettes    Last attempt to quit: 01/30/1961    Years since quitting: 57.1  . Smokeless tobacco: Never Used  Substance and Sexual Activity  . Alcohol use: No    Comment: 2 drinks (bourbon) a night  . Drug use: No  . Sexual activity: Yes  Lifestyle  . Physical activity:    Days per week: Not on file    Minutes per session: Not on file  . Stress: Not on file  Relationships  . Social connections:    Talks on phone: Not on file    Gets together: Not on file    Attends religious service: Not on file    Active member of club or organization: Not on file    Attends  meetings of clubs or organizations: Not on file    Relationship status: Not on file  Other Topics Concern  . Not on file  Social History Narrative   Married. Pt works part-time for a [employer]. Quit tobacco 50 years ago..Both parents are deceased, neither one had cardiac issues and no siblings have cardiac issues.      Family History: The patient's family history includes Cancer in his mother; Stroke in his brother. There is no history of Heart attack or Colon cancer. ROS:   Please see the history of present illness.     All other systems reviewed and are negative.  EKGs/Labs/Other Studies Reviewed:    The following studies were reviewed today:  Echocardiogram 02/11/2018 Study Conclusions - Left ventricle: The cavity size was normal. Wall thickness was   normal. Systolic function was vigorous. The estimated ejection   fraction was in the range of 65% to 70%. Wall motion was normal;   there were no regional wall motion abnormalities. Doppler   parameters are consistent with abnormal left ventricular   relaxation (grade 1 diastolic dysfunction). - Aortic valve: Trileaflet; moderately thickened, moderately   calcified leaflets. - Right ventricle: Pacer wire or catheter noted in right ventricle.   Left Heart Catheterization 09/22/2015:  Prox RCA to Mid RCA lesion, 100 %stenosed.  Ost 2nd Diag lesion, 100 %stenosed.  SVG and is normal in caliber and anatomically normal.  LM lesion, 60 %stenosed.  Ost Cx lesion, 50 %stenosed.  Ost 1st Mrg lesion, 100 %stenosed.  SVG graft was visualized by angiography and is normal in caliber and anatomically normal.  Prox LAD lesion, 100 %stenosed.  LIMA graft was visualized by angiography and is normal in caliber and anatomically normal.  1st Diag lesion, 90 %stenosed.  1. Severe triple vessel CAD s/p ?5V CABG with 3/5 patent bypass grafts. [place]. The documentation in our record over the years  states  5V CABG. No vein graft markers found today so I cannot exclude patency of 2 other vein grafts. Unable to identify vein grafts with aortic root angiogram.  2. Occluded mid LAD with filling of the mid and distal vessel from the patent LIMA graft 3. Occluded intermediate and diagonal branches which fill from the patent vein grafts.  4. Cannot identify a graft to the Diagonal or the RCA 5. Moderate left main stenosis supplying the moderate caliber circumflex.  6. Dilatation of the aortic root.   Recommendations: No targets for PCI but cannot visualize 2 additional vein grafts that are reported in his medical record. Will arrange Coronary CTA to see if we can visualize additional bypass grafts and also to get a better estimate of the size of his aortic root.   EKG:  EKG from 02/10/18 reviewed: Sinus rhythm, 61 bpm, no acute ischemic changes compared to prior tracings.  Recent Labs: 09/25/2017: Magnesium 2.0 02/10/2018: ALT 30; B Natriuretic Peptide 141.6 02/11/2018: BUN 24; Creatinine, Ser 1.12; Hemoglobin 13.0; Platelets 198; Potassium 4.5; Sodium 134   Recent Lipid Panel    Component Value Date/Time   CHOL 183 02/11/2018 0355   TRIG 152 (H) 02/11/2018 0355   HDL 35 (L) 02/11/2018 0355   CHOLHDL 5.2 02/11/2018 0355   VLDL 30 02/11/2018 0355   LDLCALC 118 (H) 02/11/2018 0355    Physical Exam:    VS:  BP 130/82   Pulse 77   Ht 5\' 6"  (1.676 m)   Wt 188 lb 6.4 oz (85.5 kg)   SpO2 96%   BMI 30.41 kg/m     Wt Readings from Last 3 Encounters:  03/08/18 188 lb 6.4 oz (85.5 kg)  03/01/18 186 lb (84.4 kg)  02/11/18 184 lb 9.6 oz (83.7 kg)     Physical Exam  Constitutional: He is oriented to person, place, and time. He appears well-developed and well-nourished. No distress.  Pt is a bright, happy fellow  HENT:  Head: Normocephalic and atraumatic.  Neck: Normal range of motion. Neck supple. No JVD present.  Cardiovascular: Normal rate, regular rhythm, normal heart sounds and  intact distal pulses. Exam reveals no gallop and no friction rub.  No murmur heard. Pulmonary/Chest: Effort normal and breath sounds normal. No respiratory distress. He has no wheezes. He has no rales.  Abdominal: Soft. Bowel sounds are normal.  Musculoskeletal: Normal range of motion.        General: No edema.  Neurological: He is alert and oriented to person, place, and time.  Skin: Skin is warm and dry.  Psychiatric: He has a normal mood and affect. His behavior is normal. Judgment and thought content normal.  Vitals reviewed.    ASSESSMENT:    1. Coronary artery disease involving native coronary artery of native heart without angina pectoris   2. Preop cardiovascular exam   3. Paroxysmal atrial fibrillation (HCC)   4. Essential hypertension   5. HYPERCHOLESTEROLEMIA   6. Pacemaker-St.Jude    PLAN:    In order of problems listed above:  Preprocedure evaluation -Patient with recent chest discomfort and fatigue felt to be more related to possible GERD, no evidence of myocardial ischemia noted during recent hospitalization. -Patient is planned for an EGD and colonoscopy for further work-up of his symptoms. -According to our pharmacy protocol Ok to hold Eliquis for 2 days prior to colonoscopy. -Patient is okay to proceed with the requested procedure without any further cardiac testing.  CAD  -Seen in the hospital on 02/11/2018 with  intermittent chest discomfort.  Troponins negative and echo with normal EF and no regional wall motion abnormalities. -Patient has chronic chest pain for many years.  No recent change.  It is not related to activity and not improved with nitroglycerin.  Possibly musculoskeletal.  Paroxysmal atrial fibrillation -Maintaining sinus rhythm on sotalol and Coreg -Has been on Coumadin for anticoagulation, however he would like to switch to DOAC.  Discussed the transition with our pharmacist.  Patient's last dose of warfarin was yesterday so he is advised to  discontinue warfarin and start Eliquis 5 mg twice daily, first dose tomorrow evening.  He and his wife both verbalized understanding.  Hypertension -On Coreg.  Lisinopril held in the hospital due to low blood pressure.  Patient also had hyperkalemia.  BP is currently well controlled.  Hyperlipidemia -Recent Lipid panel: Cholesterol 183, Triglycerides 152, HDL 35, LDL 118. -Patient reportedly stopped taking atorvastatin due to muscle aches.  He is now started on Crestor 20 mg.  We will follow-up lipid panel in 6 weeks  Permanent pacemaker -Followed by Dr. Caryl Comes   Medication Adjustments/Labs and Tests Ordered: Current medicines are reviewed at length with the patient today.  Concerns regarding medicines are outlined above. Labs and tests ordered and medication changes are outlined in the patient instructions below:  Patient Instructions  Medication Instructions:  1.) STOP: Warfarin   2.) START: Eliquis 5 mg twice a day (Starting tomorrow evening)  If you need a refill on your cardiac medications before your next appointment, please call your pharmacy.   Lab work: Your physician recommends that you return for a FASTING lipid profile: In March  If you have labs (blood work) drawn today and your tests are completely normal, you will receive your results only by: Marland Kitchen MyChart Message (if you have MyChart) OR . A paper copy in the mail If you have any lab test that is abnormal or we need to change your treatment, we will call you to review the results.  Testing/Procedures: None  Follow-Up: At [facility], you and your health needs are our priority.  As part of our continuing mission to provide you with exceptional heart care, we have created designated Provider Care Teams.  These Care Teams include your primary Cardiologist (physician) and Advanced Practice Providers (APPs -  Physician Assistants and Nurse Practitioners) who all work together to provide you with the care you need, when  you need it. You will need a follow up appointment in 6 months.  Please call our office 2 months in advance to schedule this appointment.  You may see Lauree Chandler, MD or one of the following Advanced Practice Providers on your designated Care Team:   [place], PA-C Melina Copa, PA-C . Ermalinda Barrios, PA-C  Any Other Special Instructions Will Be Listed Below (If Applicable).       Signed, Daune Perch, NP  03/08/2018 4:43 PM    [facility]

## 2018-03-08 NOTE — Patient Instructions (Addendum)
Medication Instructions:  1.) STOP: Warfarin   2.) START: Eliquis 5 mg twice a day (Starting tomorrow evening)  If you need a refill on your cardiac medications before your next appointment, please call your pharmacy.   Lab work: Your physician recommends that you return for a FASTING lipid profile: In March  If you have labs (blood work) drawn today and your tests are completely normal, you will receive your results only by: Marland Kitchen MyChart Message (if you have MyChart) OR . A paper copy in the mail If you have any lab test that is abnormal or we need to change your treatment, we will call you to review the results.  Testing/Procedures: None  Follow-Up: At [facility], you and your health needs are our priority.  As part of our continuing mission to provide you with exceptional heart care, we have created designated Provider Care Teams.  These Care Teams include your primary Cardiologist (physician) and Advanced Practice Providers (APPs -  Physician Assistants and Nurse Practitioners) who all work together to provide you with the care you need, when you need it. You will need a follow up appointment in 6 months.  Please call our office 2 months in advance to schedule this appointment.  You may see Lauree Chandler, MD or one of the following Advanced Practice Providers on your designated Care Team:   [place], PA-C Melina Copa, PA-C . Ermalinda Barrios, PA-C  Any Other Special Instructions Will Be Listed Below (If Applicable).

## 2018-03-08 NOTE — Telephone Encounter (Signed)
[facility] Pre-operative Risk Assessment     Request for surgical clearance:     Endoscopy Procedure  What type of surgery is being performed?     EGD/Colonoscopy  When is this surgery scheduled?     04/12/18  What type of clearance is required ?   Pharmacy  Are there any medications that need to be held prior to surgery and how long? Eliquis   Practice name and name of physician performing surgery?      [facility]  What is your office phone and fax number?      Phone- [phone]  [phone]  Anesthesia type (None, local, MAC, general) ?       MAC

## 2018-03-08 NOTE — Patient Instructions (Signed)
Description   Continue taking 1/2 tablet daily. Recheck in 3 weeks. Coumadin Clinic [phone]

## 2018-03-12 NOTE — Telephone Encounter (Signed)
Clinical pharmacist to review 

## 2018-03-12 NOTE — Telephone Encounter (Signed)
Patient was seen by Pecolia Ades last Friday and was cleared to proceed. See recommendation by our clinical pharmacist. Please forward both this note and Gae Bon Hammond's office note to the requesting provider.

## 2018-03-12 NOTE — Telephone Encounter (Signed)
Fax the notes and clearance to [facility]

## 2018-03-12 NOTE — Telephone Encounter (Signed)
Patient takes Eliquis for afib with CHADS2VASc score of 4 (age x2, HTN, CAD). Renal function is normal. Ok to hold Eliquis for 1-2 days prior to endoscopy.

## 2018-03-29 ENCOUNTER — Telehealth: Payer: Self-pay

## 2018-04-03 ENCOUNTER — Other Ambulatory Visit: Payer: Self-pay

## 2018-04-03 DIAGNOSIS — K219 Gastro-esophageal reflux disease without esophagitis: Secondary | ICD-10-CM

## 2018-04-03 DIAGNOSIS — Z86010 Personal history of colonic polyps: Secondary | ICD-10-CM

## 2018-04-08 ENCOUNTER — Other Ambulatory Visit: Payer: Self-pay | Admitting: Cardiovascular Disease

## 2018-04-12 ENCOUNTER — Ambulatory Visit (AMBULATORY_SURGERY_CENTER): Payer: Medicare HMO | Admitting: Gastroenterology

## 2018-04-12 ENCOUNTER — Encounter: Payer: Self-pay | Admitting: Gastroenterology

## 2018-04-12 ENCOUNTER — Other Ambulatory Visit: Payer: Self-pay

## 2018-04-12 ENCOUNTER — Telehealth: Payer: Self-pay | Admitting: Gastroenterology

## 2018-04-12 VITALS — BP 118/54 | HR 64 | Temp 98.4°F | Resp 42

## 2018-04-12 DIAGNOSIS — D120 Benign neoplasm of cecum: Secondary | ICD-10-CM | POA: Diagnosis not present

## 2018-04-12 DIAGNOSIS — Z860100 Personal history of colon polyps, unspecified: Secondary | ICD-10-CM

## 2018-04-12 DIAGNOSIS — D122 Benign neoplasm of ascending colon: Secondary | ICD-10-CM

## 2018-04-12 DIAGNOSIS — K219 Gastro-esophageal reflux disease without esophagitis: Secondary | ICD-10-CM

## 2018-04-12 DIAGNOSIS — D123 Benign neoplasm of transverse colon: Secondary | ICD-10-CM

## 2018-04-12 DIAGNOSIS — K2970 Gastritis, unspecified, without bleeding: Secondary | ICD-10-CM | POA: Diagnosis not present

## 2018-04-12 MED ORDER — SODIUM CHLORIDE 0.9 % IV SOLN: 500.0000 mL | Freq: Once | INTRAVENOUS | Status: DC

## 2018-04-12 NOTE — Patient Instructions (Signed)
Resume Eliquis tomorrow at your regular dose.  Reduce Protonix to 40 mg once a day.  YOU HAD AN ENDOSCOPIC PROCEDURE TODAY AT [facility]:   Refer to the procedure report that was given to you for any specific questions about what was found during the examination.  If the procedure report does not answer your questions, please call your gastroenterologist to clarify.  If you requested that your care partner not be given the details of your procedure findings, then the procedure report has been included in a sealed envelope for you to review at your convenience later.  YOU SHOULD EXPECT: Some feelings of bloating in the abdomen. Passage of more gas than usual.  Walking can help get rid of the air that was put into your GI tract during the procedure and reduce the bloating. If you had a lower endoscopy (such as a colonoscopy or flexible sigmoidoscopy) you may notice spotting of blood in your stool or on the toilet paper. If you underwent a bowel prep for your procedure, you may not have a normal bowel movement for a few days.  Please Note:  You might notice some irritation and congestion in your nose or some drainage.  This is from the oxygen used during your procedure.  There is no need for concern and it should clear up in a day or so.  SYMPTOMS TO REPORT IMMEDIATELY:   Following lower endoscopy (colonoscopy or flexible sigmoidoscopy):  Excessive amounts of blood in the stool  Significant tenderness or worsening of abdominal pains  Swelling of the abdomen that is new, acute  Fever of 100F or higher   Following upper endoscopy (EGD)  Vomiting of blood or coffee ground material  New chest pain or pain under the shoulder blades  Painful or persistently difficult swallowing  New shortness of breath  Fever of 100F or higher  Black, tarry-looking stools  For urgent or emergent issues, a gastroenterologist can be reached at any hour by calling [phone].   DIET:  We do  recommend a small meal at first, but then you may proceed to your regular diet.  Drink plenty of fluids but you should avoid alcoholic beverages for 24 hours.  ACTIVITY:  You should plan to take it easy for the rest of today and you should NOT DRIVE or use heavy machinery until tomorrow (because of the sedation medicines used during the test).    FOLLOW UP: Our staff will call the number listed on your records the next business day following your procedure to check on you and address any questions or concerns that you may have regarding the information given to you following your procedure. If we do not reach you, we will leave a message.  However, if you are feeling well and you are not experiencing any problems, there is no need to return our call.  We will assume that you have returned to your regular daily activities without incident.  If any biopsies were taken you will be contacted by phone or by letter within the next 1-3 weeks.  Please call us at [phone] if you have not heard about the biopsies in 3 weeks.    SIGNATURES/CONFIDENTIALITY: You and/or your care partner have signed paperwork which will be entered into your electronic medical record.  These signatures attest to the fact that that the information above on your After Visit Summary has been reviewed and is understood.  Full responsibility of the confidentiality of this discharge information lies  with you and/or your care-partner.

## 2018-04-12 NOTE — Op Note (Signed)
[place] Patient Name: Dylan Roth Procedure Date: 04/12/2018 10:53 AM MRN: [id number] Endoscopist: Jackquline Denmark , MD Age: 78 Referring MD:  Date of Birth: 04-03-1940 Gender: Male Account #: [id number] Procedure:                Colonoscopy Indications:              High risk colon cancer surveillance: Personal                            history of colonic polyps Medicines:                Monitored Anesthesia Care Procedure:                Pre-Anesthesia Assessment:                           - Prior to the procedure, a History and Physical                            was performed, and patient medications and                            allergies were reviewed. The patient's tolerance of                            previous anesthesia was also reviewed. The risks                            and benefits of the procedure and the sedation                            options and risks were discussed with the patient.                            All questions were answered, and informed consent                            was obtained. Prior Anticoagulants: The patient has                            taken Eliquis (apixaban), last dose was 2 days                            prior to procedure. ASA Grade Assessment: III - A                            patient with severe systemic disease. After                            reviewing the risks and benefits, the patient was                            deemed in satisfactory condition to undergo the  procedure.                           After obtaining informed consent, the colonoscope                            was passed under direct vision. Throughout the                            procedure, the patient's blood pressure, pulse, and                            oxygen saturations were monitored continuously. The                            Colonoscope was introduced through the anus and    advanced to the 2 cm into the ileum. The                            colonoscopy was performed without difficulty. The                            patient tolerated the procedure well. The quality                            of the bowel preparation was adequate to identify                            polyps. Scope In: 11:05:58 AM Scope Out: 11:19:36 AM Scope Withdrawal Time: 0 hours 9 minutes 47 seconds  Total Procedure Duration: 0 hours 13 minutes 38 seconds  Findings:                 Five sessile polyps were found in the proximal                            transverse colon, distal transverse colon,                            ascending colon and cecum. The polyps were 4 to 6                            mm in size. These polyps were removed with a cold                            snare. Resection and retrieval were complete.                            Estimated blood loss: none.                           A few small and large-mouthed diverticula were                            found in the sigmoid colon, descending colon,  ascending colon and cecum.                           Non-bleeding internal hemorrhoids were found during                            retroflexion. The hemorrhoids were small.                           The terminal ileum appeared normal.                           The exam was otherwise without abnormality on                            direct and retroflexion views. Complications:            No immediate complications. Estimated Blood Loss:     Estimated blood loss: none. Impression:               -Small colonic polyps status post polypectomy.                           -Mild pancolonic diverticulosis.                           -Otherwise normal colonoscopy to TI. Recommendation:           - Patient has a contact number available for                            emergencies. The signs and symptoms of potential                            delayed  complications were discussed with the                            patient. Return to normal activities tomorrow.                            Written discharge instructions were provided to the                            patient.                           - Resume previous diet.                           - Continue present medications.                           - Await pathology results.                           - Repeat colonoscopy for surveillance based on  pathology results.                           - Resume Eliquis (apixaban) at prior dose tomorrow                            PM. Jackquline Denmark, MD 04/12/2018 11:33:42 AM This report has been signed electronically.

## 2018-04-12 NOTE — Progress Notes (Signed)
Report given to PACU, vss 

## 2018-04-12 NOTE — Progress Notes (Signed)
Called to room to assist during endoscopic procedure.  Patient ID and intended procedure confirmed with present staff. Received instructions for my participation in the procedure from the performing physician.  

## 2018-04-12 NOTE — Op Note (Signed)
[place] Patient Name: Dylan Roth Procedure Date: 04/12/2018 10:55 AM MRN: [id number] Endoscopist: Jackquline Denmark , MD Age: 78 Referring MD:  Date of Birth: [birth year] Gender: Male Account #: [id number] Procedure:                Upper GI endoscopy Indications:              GERD/noncardiac chest pains. Medicines:                Monitored Anesthesia Care Procedure:                Pre-Anesthesia Assessment:                           - Prior to the procedure, a History and Physical                            was performed, and patient medications and                            allergies were reviewed. The patient's tolerance of                            previous anesthesia was also reviewed. The risks                            and benefits of the procedure and the sedation                            options and risks were discussed with the patient.                            All questions were answered, and informed consent                            was obtained. Prior Anticoagulants: The patient has                            taken Eliquis (apixaban), last dose was 2 days                            prior to procedure. ASA Grade Assessment: III - A                            patient with severe systemic disease. After                            reviewing the risks and benefits, the patient was                            deemed in satisfactory condition to undergo the                            procedure.  After obtaining informed consent, the endoscope was                            passed under direct vision. Throughout the                            procedure, the patient's blood pressure, pulse, and                            oxygen saturations were monitored continuously. The                            Endoscope was introduced through the mouth, and                            advanced to the second part of duodenum. The upper                 GI endoscopy was accomplished without difficulty.                            The patient tolerated the procedure well. Scope In: Scope Out: Findings:                 The examined esophagus was normal. Biopsies were                            obtained from the proximal and distal esophagus                            with cold forceps for histology of suspected                            eosinophilic esophagitis.                           The Z-line was regular and was found 40 cm from the                            incisors.                           Localized mild inflammation characterized by                            erythema was found in the gastric antrum. Biopsies                            were taken with a cold forceps for histology.                           The examined duodenum was normal. Complications:            No immediate complications. Estimated Blood Loss:     Estimated blood loss: none. Impression:               -Mild gastritis (Biopsied)                           -  Otherwise normal EGD. Recommendation:           - Resume previous diet.                           - Can reduce Protonix to 40 mg p.o. once a day.                           - Await pathology results.                           - Resume Eliquis (apixaban) at prior dose tomorrow. Jackquline Denmark, MD 04/12/2018 11:25:34 AM This report has been signed electronically.

## 2018-04-12 NOTE — Telephone Encounter (Signed)
Pt had EGD col this morning 3.13.20 and has not been able to urinate.  Please advise.

## 2018-04-12 NOTE — Telephone Encounter (Signed)
Returned phone call to patient. He states he has not urinated since midnight but he does report he is having watery stool as he was from his prep. He denies feeling the need to urinate and denies any discomfort. We discussed it is likely due to dehydration and to push fluids. He agrees. We also discussed if he cannot urinate and becomes uncomfortable he would need to go to the local ER for evaluation. Pt states understanding and appreciated the phone call back.

## 2018-04-15 ENCOUNTER — Telehealth: Payer: Self-pay

## 2018-04-15 ENCOUNTER — Telehealth: Payer: Self-pay | Admitting: *Deleted

## 2018-04-15 NOTE — Telephone Encounter (Signed)
Follow up.

## 2018-04-15 NOTE — Telephone Encounter (Signed)
Dylan Roth from [facility] called. She says that the pt does not need to hold Eliquis for retinal surgery tomorrow. Please contact today.  [phone]

## 2018-04-15 NOTE — Telephone Encounter (Signed)
   [facility] Pre-operative Risk Assessment    Request for surgical clearance:  1. What type of surgery is being performed? VITRECTOMY    2. When is this surgery scheduled? 04/16/18   3. What type of clearance is required (medical clearance vs. Pharmacy clearance to hold med vs. Both)? BOTH  4. Are there any medications that need to be held prior to surgery and how long?ELIQUIS   5. Practice name and name of physician performing surgery? [facility]; DR. Hart Carwin [place]   6. What is your office phone number [phone]    7.   What is your office fax number [phone]  8.   Anesthesia type (None, local, MAC, general) ? IV SEDATION   Julaine Hua 04/15/2018, 1:32 PM  _________________________________________________________________   (provider comments below)

## 2018-04-15 NOTE — Telephone Encounter (Signed)
   Primary Cardiologist: Lauree Chandler, MD  Chart reviewed as part of pre-operative protocol coverage. Patient was contacted 04/15/2018 in reference to pre-operative risk assessment for pending surgery as outlined below.  Dylan Roth was last seen on 03/08/2018 by Daune Perch.  Since that day, Dylan Roth has done well. He has chronic intermittent chest discomfort, however no change in degree or frequency lately. He is stable from cardiac perspective  Therefore, based on ACC/AHA guidelines, the patient would be at acceptable risk for the planned procedure without further cardiovascular testing.   I will route this recommendation to the requesting party via Epic fax function and remove from pre-op pool.  Please call with questions. Discussed with the patient, he says his eye doctor told him to hold eliquis for 1 day prior to surgery, his last dose of eliquis was last night. I spoke to [place] [facility] specialist, she says Eliquis does not need to be held. Ideally continue eliquis through the surgery, but if needed, may hold eliquis for 1 day and restart as soon as possible afterward.   [place], [place] 04/15/2018, 1:57 PM

## 2018-04-15 NOTE — Telephone Encounter (Signed)
  Follow up Call-  Call back number 04/12/2018  Post procedure Call Back phone  # [phone]  Permission to leave phone message Yes  Some recent data might be hidden     Patient questions:  Do you have a fever, pain , or abdominal swelling? No. Pain Score  0 *  Have you tolerated food without any problems? Yes.    Have you been able to return to your normal activities? Yes.    Do you have any questions about your discharge instructions: Diet   No. Medications  No. Follow up visit  No.  Do you have questions or concerns about your Care? No.  Actions: * If pain score is 4 or above: No action needed, pain <4.

## 2018-04-15 NOTE — Telephone Encounter (Signed)
Surgery is tomorrow, will check with clinical pharmacist?

## 2018-04-15 NOTE — Telephone Encounter (Signed)
Error no message needed.

## 2018-04-18 ENCOUNTER — Encounter: Payer: Self-pay | Admitting: Gastroenterology

## 2018-04-23 ENCOUNTER — Other Ambulatory Visit: Payer: Self-pay | Admitting: *Deleted

## 2018-04-23 ENCOUNTER — Telehealth: Payer: Self-pay | Admitting: Gastroenterology

## 2018-04-23 MED ORDER — APIXABAN 5 MG PO TABS: 5.0000 mg | ORAL_TABLET | Freq: Two times a day (BID) | ORAL | 5 refills | Status: DC

## 2018-04-23 NOTE — Telephone Encounter (Signed)
The pt was given anti reflux precautions and advised to take his pantoprazole twice daily 20-30 min prior to meals and add pepcid at bedtime.  He will call back if these changes are not helpful

## 2018-04-23 NOTE — Telephone Encounter (Signed)
78 yo, 85.5kg, Scr 1.12 on 02/11/18 Last OV 03/08/18

## 2018-04-23 NOTE — Telephone Encounter (Signed)
Pt called stating that he is still having the acid reflux and is needing some advice from the nurse. He stated he was not giving anything just incase it stated back

## 2018-04-25 MED ORDER — APIXABAN 5 MG PO TABS: 5.0000 mg | ORAL_TABLET | Freq: Two times a day (BID) | ORAL | 1 refills | Status: DC

## 2018-04-25 NOTE — Telephone Encounter (Signed)
Fax received from [facility] for a 90 day supply, rx sent per previous approval on 04/23/18.

## 2018-04-25 NOTE — Addendum Note (Signed)
Addended by: Brynda Peon on: 04/25/2018 11:55 AM   Modules accepted: Orders

## 2018-05-10 ENCOUNTER — Other Ambulatory Visit: Payer: Self-pay | Admitting: Cardiovascular Disease

## 2018-05-20 ENCOUNTER — Other Ambulatory Visit: Payer: Self-pay

## 2018-05-20 ENCOUNTER — Encounter: Payer: Medicare HMO | Admitting: *Deleted

## 2018-05-21 ENCOUNTER — Telehealth: Payer: Self-pay

## 2018-05-21 NOTE — Telephone Encounter (Signed)
Unable to speak  with patient to remind of missed remote transmission 

## 2018-05-28 ENCOUNTER — Encounter: Payer: Self-pay | Admitting: Cardiology

## 2018-06-11 ENCOUNTER — Other Ambulatory Visit: Payer: Self-pay

## 2018-06-11 ENCOUNTER — Encounter: Payer: Medicare HMO | Admitting: *Deleted

## 2018-06-27 ENCOUNTER — Other Ambulatory Visit: Payer: Self-pay | Admitting: Cardiovascular Disease

## 2018-06-27 MED ORDER — NITROGLYCERIN 0.4 MG SL SUBL: SUBLINGUAL_TABLET | SUBLINGUAL | 1 refills | Status: DC

## 2018-06-27 NOTE — Telephone Encounter (Signed)
Pt's medication was sent to pt's pharmacy as requested. Confirmation received.  °

## 2018-07-09 ENCOUNTER — Ambulatory Visit (INDEPENDENT_AMBULATORY_CARE_PROVIDER_SITE_OTHER): Payer: Medicare HMO | Admitting: *Deleted

## 2018-07-09 DIAGNOSIS — I495 Sick sinus syndrome: Secondary | ICD-10-CM

## 2018-07-09 DIAGNOSIS — I4891 Unspecified atrial fibrillation: Secondary | ICD-10-CM

## 2018-07-09 LAB — CUP PACEART REMOTE DEVICE CHECK
Battery Remaining Longevity: 60 mo
Battery Remaining Percentage: 46 %
Battery Voltage: 2.86 V
Brady Statistic AP VP Percent: 21 %
Brady Statistic AP VS Percent: 74 %
Brady Statistic AS VP Percent: 1 %
Brady Statistic AS VS Percent: 3.7 %
Brady Statistic RA Percent Paced: 95 %
Brady Statistic RV Percent Paced: 22 %
Date Time Interrogation Session: 20200608184851
Implantable Lead Implant Date: 20130425
Implantable Lead Implant Date: 20130425
Implantable Lead Location: 753859
Implantable Lead Location: 753860
Implantable Pulse Generator Implant Date: 20130425
Lead Channel Impedance Value: 400 Ohm
Lead Channel Impedance Value: 460 Ohm
Lead Channel Pacing Threshold Amplitude: 0.625 V
Lead Channel Pacing Threshold Amplitude: 0.875 V
Lead Channel Pacing Threshold Pulse Width: 0.4 ms
Lead Channel Pacing Threshold Pulse Width: 0.4 ms
Lead Channel Sensing Intrinsic Amplitude: 3.2 mV
Lead Channel Sensing Intrinsic Amplitude: 4.5 mV
Lead Channel Setting Pacing Amplitude: 1.125
Lead Channel Setting Pacing Amplitude: 1.625
Lead Channel Setting Pacing Pulse Width: 0.4 ms
Lead Channel Setting Sensing Sensitivity: 2 mV
Pulse Gen Model: 2110
Pulse Gen Serial Number: 7322329

## 2018-07-11 ENCOUNTER — Other Ambulatory Visit: Payer: Self-pay | Admitting: Cardiovascular Disease

## 2018-07-16 ENCOUNTER — Telehealth: Payer: Self-pay | Admitting: Cardiovascular Disease

## 2018-07-16 NOTE — Telephone Encounter (Signed)
Pt takes Eliquis for afib with CHADS2VASc score of 4 (age x2, HTN, CAD). Renal function is normal. Ok to hold Eliquis for 2 days prior to procedure as requested.

## 2018-07-16 NOTE — Telephone Encounter (Signed)
Follow Up:    Tim the PA from [facility] called. He was checking on the status of pt's clearance. He needs this today please. Please fax to [phone],

## 2018-07-16 NOTE — Telephone Encounter (Signed)
Pt had surgery scheduled for 03/2018, but could never get done due to covid.  SEE NEW CLEARANCE BELOW:      Dylan Roth    Request for surgical clearance:  1. What type of surgery is being performed?  VITRECTOMY   2. When is this surgery scheduled?  07/18/2018   3. What type of clearance is required (medical clearance vs. Pharmacy clearance to hold med vs. Both)?  PHARMACY  4. Are there any medications that need to be held prior to surgery and how long? ELIQUIS X'S 2 DAYS   5. Practice name and name of physician performing surgery?  [facility] / [facility]   6. What is your office phone number [phone]    7.   What is your office fax number [phone]  8.   Anesthesia type (None, local, MAC, general) ?  EYE DROPS / POSSIBLE IV    Dylan Roth 07/16/2018, 11:01 AM  _________________________________________________________________   (provider comments below)

## 2018-07-17 NOTE — Progress Notes (Signed)
Remote pacemaker transmission.   

## 2018-07-29 ENCOUNTER — Other Ambulatory Visit: Payer: Self-pay | Admitting: Cardiovascular Disease

## 2018-08-15 ENCOUNTER — Telehealth: Payer: Self-pay | Admitting: Cardiovascular Disease

## 2018-09-26 ENCOUNTER — Telehealth: Payer: Self-pay | Admitting: Cardiovascular Disease

## 2018-09-26 NOTE — Telephone Encounter (Signed)
°  [facility] called on behalf of the patient. 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 [facility] Needs Dr. 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   The patient can pick up the letter in person, and is expecting the letter by 09/03. If you could fax a copy of the letter to [facility] for their records as well they would appreciate it.   Their office phone # is: [phone] and their fax # is : [phone]

## 2018-09-27 ENCOUNTER — Encounter: Payer: Self-pay | Admitting: Cardiovascular Disease

## 2018-09-27 NOTE — Telephone Encounter (Signed)
Can you help with this? Thanks, chris

## 2018-09-27 NOTE — Telephone Encounter (Signed)
Left detailed message on VM (DPR) that letter is ready for him to pick up at office lobby downstairs and that a copy will be faxed to [place] at number listed below as requested.  Adv to call office with any questions.

## 2018-10-09 ENCOUNTER — Ambulatory Visit (INDEPENDENT_AMBULATORY_CARE_PROVIDER_SITE_OTHER): Payer: Medicare HMO | Admitting: *Deleted

## 2018-10-09 DIAGNOSIS — I495 Sick sinus syndrome: Secondary | ICD-10-CM | POA: Diagnosis not present

## 2018-10-09 DIAGNOSIS — I480 Paroxysmal atrial fibrillation: Secondary | ICD-10-CM

## 2018-10-10 LAB — CUP PACEART REMOTE DEVICE CHECK
Battery Remaining Longevity: 60 mo
Battery Remaining Percentage: 46 %
Battery Voltage: 2.86 V
Brady Statistic AP VP Percent: 25 %
Brady Statistic AP VS Percent: 72 %
Brady Statistic AS VP Percent: 1 %
Brady Statistic AS VS Percent: 3.1 %
Brady Statistic RA Percent Paced: 96 %
Brady Statistic RV Percent Paced: 26 %
Date Time Interrogation Session: 20200910104534
Implantable Lead Implant Date: 20130425
Implantable Lead Implant Date: 20130425
Implantable Lead Location: 753859
Implantable Lead Location: 753860
Implantable Pulse Generator Implant Date: 20130425
Lead Channel Impedance Value: 410 Ohm
Lead Channel Impedance Value: 510 Ohm
Lead Channel Pacing Threshold Amplitude: 0.625 V
Lead Channel Pacing Threshold Amplitude: 1 V
Lead Channel Pacing Threshold Pulse Width: 0.4 ms
Lead Channel Pacing Threshold Pulse Width: 0.4 ms
Lead Channel Sensing Intrinsic Amplitude: 2.8 mV
Lead Channel Sensing Intrinsic Amplitude: 4.8 mV
Lead Channel Setting Pacing Amplitude: 1.25 V
Lead Channel Setting Pacing Amplitude: 1.625
Lead Channel Setting Pacing Pulse Width: 0.4 ms
Lead Channel Setting Sensing Sensitivity: 2 mV
Pulse Gen Model: 2110
Pulse Gen Serial Number: 7322329

## 2018-10-22 ENCOUNTER — Other Ambulatory Visit: Payer: Self-pay | Admitting: Cardiovascular Disease

## 2018-10-24 NOTE — Progress Notes (Signed)
Remote pacemaker transmission.   

## 2018-10-29 ENCOUNTER — Telehealth: Payer: Self-pay | Admitting: *Deleted

## 2018-10-29 NOTE — Telephone Encounter (Signed)
   [facility] Pre-operative Risk Assessment    Request for surgical clearance:  1. What type of surgery is being performed? RIGHT TKA   2. When is this surgery scheduled? TBD   3. What type of clearance is required (medical clearance vs. Pharmacy clearance to hold med vs. Both)? BOTH  4. Are there any medications that need to be held prior to surgery and how long? ELIQUIS X 3 DAYS    5. Practice name and name of physician performing surgery? [facility]; DR. Lara Mulch   6. What is your office phone number [phone]    7.   What is your office fax number [phone]  8.   Anesthesia type (None, local, MAC, general) ? SPINAL    Dylan Roth 10/29/2018, 4:37 PM  _________________________________________________________________   (provider comments below)

## 2018-10-29 NOTE — Telephone Encounter (Signed)
   Primary Cardiologist:Christopher Angelena Form, MD  Chart reviewed as part of pre-operative protocol coverage. Because of Dylan Roth past medical history and time since last visit, he/she will require a follow-up visit in order to better assess preoperative cardiovascular risk.  Pre-op covering staff: - Pt has an upcoming appts with Dr. Caryl Comes and with Dr. Angelena Form. I will forward to them to address his clearance for TKA.   - Please contact requesting surgeon's office via preferred method (i.e, phone, fax) to inform them of need for appointment prior to surgery.  If applicable, this message will also be routed to pharmacy pool and/or primary cardiologist for input on holding anticoagulant/antiplatelet agent as requested below so that this information is available at time of patient's appointment.   Ledora Bottcher, PA  10/29/2018, 5:39 PM

## 2018-10-29 NOTE — Telephone Encounter (Signed)
Patient with diagnosis of atrial fibrillation on Eliquis for anticoagulation.    Procedure: right TKA Date of procedure: TBD  CHADS2-VASc score of  3 (HTN, AGE, CAD)  CrCl 67.9 Platelet count 198  Per office protocol, patient can hold Eliquis for 3 days prior to procedure.    For orthopedic procedures please be sure to resume therapeutic (not prophylactic) dosing.

## 2018-10-30 NOTE — Telephone Encounter (Signed)
Recommendations sent to requesting office successfully via Epic Fax.

## 2018-10-31 NOTE — Telephone Encounter (Signed)
OK to hold Eliquis prior to procedure.   Dylan Roth

## 2018-10-31 NOTE — Telephone Encounter (Signed)
Have called several times, unable to reach patient.

## 2018-11-04 NOTE — Telephone Encounter (Signed)
   Primary Cardiologist: Lauree Chandler, MD  Chart reviewed as part of pre-operative protocol coverage. Given past medical history and time since last visit, based on ACC/AHA guidelines, Dylan Roth would be at acceptable risk for the planned procedure without further cardiovascular testing.   Patient with diagnosis of atrial fibrillation on Eliquis for anticoagulation with a CHADS2-VASc score of  3 (HTN, AGE, CAD) and CrCl 67.9. Platelet count 198  Per office protocol, patient can hold Eliquis for 3 days prior to procedure.    For orthopedic procedures please be sure to resume therapeutic (not prophylactic) dosing.  Pt has an upcoming appointment with Dr. Angelena Form on 12/02/2018.  Patient would like to proceed with appointment prior to scheduling orthopedic surgery.  We will go ahead and forward clearance, as okayed by Dr. Angelena Form, over to surgical team for review.  I will route this recommendation to the requesting party via Epic fax function and remove from pre-op pool.  Please call with questions.  [name], NP 11/04/2018, 10:44 AM

## 2018-11-11 ENCOUNTER — Other Ambulatory Visit: Payer: Self-pay | Admitting: Orthopedic Surgery

## 2018-11-21 ENCOUNTER — Other Ambulatory Visit: Payer: Self-pay

## 2018-11-21 ENCOUNTER — Encounter: Payer: Self-pay | Admitting: Internal Medicine

## 2018-11-21 ENCOUNTER — Ambulatory Visit: Payer: Medicare HMO | Admitting: Internal Medicine

## 2018-11-21 VITALS — BP 128/74 | HR 62 | Ht 66.0 in | Wt 185.0 lb

## 2018-11-21 DIAGNOSIS — I495 Sick sinus syndrome: Secondary | ICD-10-CM

## 2018-11-21 DIAGNOSIS — Z950 Presence of cardiac pacemaker: Secondary | ICD-10-CM | POA: Diagnosis not present

## 2018-11-21 DIAGNOSIS — Z5181 Encounter for therapeutic drug level monitoring: Secondary | ICD-10-CM

## 2018-11-21 DIAGNOSIS — I480 Paroxysmal atrial fibrillation: Secondary | ICD-10-CM | POA: Diagnosis not present

## 2018-11-21 DIAGNOSIS — Z79899 Other long term (current) drug therapy: Secondary | ICD-10-CM

## 2018-11-21 NOTE — Patient Instructions (Addendum)
Medication Instructions:  Your physician recommends that you continue on your current medications as directed. Please refer to the Current Medication list given to you today.  *If you need a refill on your cardiac medications before your next appointment, please call your pharmacy*  Labwork: Today: BMET, CBC & Magnesium level If you have labs (blood work) drawn today and your tests are completely normal, you will receive your results only by:  [place] (if you have MyChart) OR  A paper copy in the mail If you have any lab test that is abnormal or we need to change your treatment, we will call you to review the results.  Testing/Procedures: None ordered  Follow-Up: Remote monitoring is used to monitor your Pacemaker or ICD from home. This monitoring reduces the number of office visits required to check your device to one time per year. It allows [place] to keep an eye on the functioning of your device to ensure it is working properly. You are scheduled for a device check from home on 01/08/19. You may send your transmission at any time that day. If you have a wireless device, the transmission will be sent automatically. After your physician reviews your transmission, you will receive a postcard with your next transmission date.  At [facility], you and your health needs are our priority.  As part of our continuing mission to provide you with exceptional heart care, we have created designated Provider Care Teams.  These Care Teams include your primary Cardiologist (physician) and Advanced Practice Providers (APPs -  Physician Assistants and Nurse Practitioners) who all work together to provide you with the care you need, when you need it.  You will need a follow up appointment in 12 months.  Please call our office 2 months in advance to schedule this appointment.  You may see Dr Curt Bears or one of the following Advanced Practice Providers on your designated Care Team:    Chanetta Marshall, NP   Tommye Standard, PA-C  Oda Kilts, [place]  Thank you for choosing [facility]!!   Trinidad Curet, RN [phone]  Any Other Special Instructions Will Be Listed Below (If Applicable).

## 2018-11-21 NOTE — Progress Notes (Signed)
Patient Care Team: Spry, Marsh Dolly., MD as PCP - General (Family Medicine) Burnell Blanks, MD as PCP - Cardiology (Cardiology) [name], MD as PCP - Electrophysiology (Cardiology)   HPI  Dylan Roth is a 78 y.o. male Seen in followup for atrial fibrillation with  posttermination pauses for which he underwent pacemaker implantation 4/13. He has been treated with sotalol and Rivaroxaban; he ended up back on warfarin because of bleeding issues  He has a history of ischemic heart disease with prior bypass surgery 2005.    DATE TEST EF   9/14 Echo  55-65   8/17 LHC   % 3/5 grafts patent   1/20 Echo  65-70%     The patient denies chest pain,  nocturnal dyspnea, orthopnea or peripheral edema.  There have been no palpitations, lightheadedness or syncope.  Some dyspnea on exertion.  Abates with sitting down.  Was working in a tree and fell out of the tree and was trashed his hip.  He may need surgery.    Date Cr K Mg Hgb  8/17  0.94 4.4 1.9 14.2   12/17      15.2  2/19 1.04 4.7 2.0   1/20 1.12 4.5  13.0    Date Rwave  7/18 5.0-6.3  10/20 3.1-5.0      Past Medical History:  Diagnosis Date  . Anemia   . Angina   . Arthritis    "in my back"  . Atrial fibrillation/flutter   . BPH (benign prostatic hyperplasia)   . CAD (coronary artery disease) Dec 2005   Hx MI. 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 Cataract    bilateral-removed 6-7 years ago  . Central obesity   . Chronic back pain   . Erectile dysfunction   . GERD (gastroesophageal reflux disease)   . Gout    "once; I have it under control w/medicine"  . History of anemia   . History of colon polyps   . Hyperlipidemia   . Hypertension   . IBS (irritable bowel syndrome)    with diarrhea  . Pacemaker   . Palpitations   . Sinus node dysfunction (HCC)    post termination pauses <5sec    Past Surgical History:  Procedure Laterality Date   . CARDIAC CATHETERIZATION  2005   "that what sent me to CABG"  . CARDIAC CATHETERIZATION N/A 09/22/2015   Procedure: Left Heart Cath and Cors/Grafts Angiography;  Surgeon: Burnell Blanks, MD;  Location: [place] [facility];  Service: Cardiovascular;  Laterality: N/A;  . CATARACT EXTRACTION W/ INTRAOCULAR LENS  IMPLANT, BILATERAL  ~ 01/2011  . CHOLECYSTECTOMY  1978   . COLONOSCOPY  2014   (Polypectomy) colonic polyps status post polypectomy. Pancolonic diverticulosis predominantly in the sigmoid colon. Small internal hemorrhoids. Patient also had perirectal excoriation  . CORONARY ARTERY BYPASS GRAFT  2005   in [place] - LIMA to LAD, SVG to diagonal, SVG to ramus, SVG to OM and SVG to RCA   . PACEMAKER INSERTION  2014  . PERMANENT PACEMAKER INSERTION N/A 05/25/2011   Procedure: PERMANENT PACEMAKER INSERTION;  Surgeon: [name], MD;  Location: [facility];  Service: Cardiovascular;  Laterality: N/A;  . TRANSURETHRAL RESECTION OF PROSTATE N/A 12/29/2013   Procedure: TRANSURETHRAL RESECTION OF THE PROSTATE WITH GYRUS INSTRUMENTS;  Surgeon: [name], MD;  Location: [facility];  Service: Urology;  Laterality: N/A;  Current Outpatient Medications  Medication Sig Dispense Refill  . allopurinol (ZYLOPRIM) 300 MG tablet Take 300 mg by mouth every morning.     Marland Kitchen apixaban (ELIQUIS) 5 MG TABS tablet Take 1 tablet (5 mg total) by mouth 2 (two) times daily. 180 tablet 1  . atorvastatin (LIPITOR) 40 MG tablet TAKE 1 TABLET AT BEDTIME    . carvedilol (COREG) 3.125 MG tablet TAKE 1 TABLET (3.125 MG TOTAL) BY MOUTH 2 (TWO) TIMES DAILY WITH A MEAL. 180 tablet 2  . isosorbide mononitrate (IMDUR) 30 MG 24 hr tablet TAKE 1 TABLET TWICE DAILY 180 tablet 2  . nitroGLYCERIN (NITROSTAT) 0.4 MG SL tablet DISSOLVE ONE TABLET UNDER THE TONGUE EVERY 5 MINUTES AS NEEDED FOR CHEST PAIN.  DO NOT EXCEED A TOTAL OF 3 DOSES IN 15 MINUTES 75 tablet 1  . ranolazine (RANEXA) 500 MG 12 hr tablet TAKE 1 TABLET  TWICE DAILY 180 tablet 1  . sotalol (BETAPACE) 120 MG tablet TAKE 1 TABLET EVERY 12 HOURS 180 tablet 1  . vitamin B-12 (CYANOCOBALAMIN) 1000 MCG tablet Take 1,000 mcg by mouth at bedtime.     No current facility-administered medications for this visit.     Allergies  Allergen Reactions  . Sulfa Antibiotics Other (See Comments)    SWELLING ON PATIENTS LIPS, HE GETS DRIED UP  . Sulfamethoxazole Other (See Comments)    Unknown  . Sulfamethoxazole-Trimethoprim Other (See Comments)    Unknown    Review of Systems negative except from HPI and PMH  Physical Exam BP 128/74   Pulse 62   Ht 5\' 6"  (1.676 m)   Wt 185 lb (83.9 kg)   SpO2 99%   BMI 29.86 kg/m  Well developed and well nourished in no acute distress HENT normal Neck supple with JVP-flat Clear Device pocket well healed; without hematoma or erythema.  There is no tethering  Regular rate and rhythm, no gallop No  murmur Abd-soft with active BS No Clubbing cyanosis   edema Skin-warm and dry A & Oriented  Grossly normal sensory and motor function  ECG  Apace @ 63 28/09/46 TWI 3,F, V3-4  NEW 1/20     Assessment and  Plan  Ischemic heart disease with prior bypass surgery--New ECG changes  Exertional dyspnea-   Sinus node dysfunction and chronotropic incompetence  Pacemaker-St. Jude  The patient's device was interrogated and the information was fully reviewed.  The device was reprogrammed to ADDRESS Gradually decreasing Rwave  High Risk Medication Surveillance-sotalol  AF paroxsymal  AV conduction 1 AVBlock   Infrequent atrial fibrillation  On Anticoagulation;  No bleeding issues   Needs surveillance laboratories for sotalol and anticoagulation  His dyspnea might be related to exuberant rate response algorithm.  Only 60-65% of his beats were in his lowest been.  We took him on 2 walks.  Prior to reprogramming his exercise heart rate was 107; following a decrease in the threshold from 12--10 exercise heart  rate was 85.  He felt considerably better.

## 2018-11-22 ENCOUNTER — Telehealth: Payer: Self-pay

## 2018-11-22 DIAGNOSIS — I495 Sick sinus syndrome: Secondary | ICD-10-CM

## 2018-11-22 LAB — BASIC METABOLIC PANEL
BUN/Creatinine Ratio: 21 (ref 10–24)
BUN: 20 mg/dL (ref 8–27)
CO2: 22 mmol/L (ref 20–29)
Calcium: 9.2 mg/dL (ref 8.6–10.2)
Chloride: 99 mmol/L (ref 96–106)
Creatinine, Ser: 0.97 mg/dL (ref 0.76–1.27)
GFR calc Af Amer: 87 mL/min/{1.73_m2} (ref >59)
GFR calc non Af Amer: 76 mL/min/{1.73_m2} (ref >59)
Sodium: 137 mmol/L (ref 134–144)

## 2018-11-22 LAB — CBC
Hematocrit: 42.2 % (ref 37.5–51.0)
Hemoglobin: 14 g/dL (ref 13.0–17.7)
MCH: 33 pg (ref 26.6–33.0)
MCHC: 33.2 g/dL (ref 31.5–35.7)
MCV: 100 fL — ABNORMAL HIGH (ref 79–97)
Platelets: 202 10*3/uL (ref 150–450)
RBC: 4.24 x10E6/uL (ref 4.14–5.80)
RDW: 13.7 % (ref 11.6–15.4)
WBC: 7.2 10*3/uL (ref 3.4–10.8)

## 2018-11-22 LAB — MAGNESIUM: Magnesium: 1.8 mg/dL (ref 1.6–2.3)

## 2018-11-22 NOTE — Telephone Encounter (Signed)
-----   Message from [name], MD sent at 11/22/2018  2:24 PM EDT ----- Rolanda Lundborg can you plz arrange for repeat K Thanks sk

## 2018-11-22 NOTE — Telephone Encounter (Signed)
LVM for pt to schedule a BMP redraw. The last draw was hemolyzed.

## 2018-11-25 NOTE — Telephone Encounter (Signed)
Spoke to pt about repeat lab needed. Pt reports that he will be in the office next week, Monday 11/2, for his follow up with Dr. Angelena Form.   Advised pt that he may get the blood work that day.  Aware I will forward lab need to [facility]'s nurse.

## 2018-12-02 ENCOUNTER — Encounter: Payer: Self-pay | Admitting: Cardiovascular Disease

## 2018-12-02 ENCOUNTER — Ambulatory Visit: Payer: Medicare HMO | Admitting: Cardiovascular Disease

## 2018-12-02 ENCOUNTER — Other Ambulatory Visit: Payer: Self-pay

## 2018-12-02 ENCOUNTER — Other Ambulatory Visit: Payer: Medicare HMO | Admitting: *Deleted

## 2018-12-02 VITALS — BP 132/74 | HR 60 | Ht 66.0 in | Wt 183.4 lb

## 2018-12-02 DIAGNOSIS — I495 Sick sinus syndrome: Secondary | ICD-10-CM

## 2018-12-02 DIAGNOSIS — E7800 Pure hypercholesterolemia, unspecified: Secondary | ICD-10-CM

## 2018-12-02 DIAGNOSIS — I25119 Atherosclerotic heart disease of native coronary artery with unspecified angina pectoris: Secondary | ICD-10-CM

## 2018-12-02 DIAGNOSIS — I10 Essential (primary) hypertension: Secondary | ICD-10-CM

## 2018-12-02 DIAGNOSIS — I480 Paroxysmal atrial fibrillation: Secondary | ICD-10-CM | POA: Diagnosis not present

## 2018-12-02 LAB — BASIC METABOLIC PANEL
BUN/Creatinine Ratio: 16 (ref 10–24)
BUN: 17 mg/dL (ref 8–27)
CO2: 23 mmol/L (ref 20–29)
Calcium: 9.2 mg/dL (ref 8.6–10.2)
Chloride: 100 mmol/L (ref 96–106)
Creatinine, Ser: 1.04 mg/dL (ref 0.76–1.27)
GFR calc Af Amer: 80 mL/min/{1.73_m2} (ref >59)
GFR calc non Af Amer: 69 mL/min/{1.73_m2} (ref >59)
Glucose: 90 mg/dL (ref 65–99)
Potassium: 4.9 mmol/L (ref 3.5–5.2)
Sodium: 137 mmol/L (ref 134–144)

## 2018-12-02 NOTE — Patient Instructions (Addendum)
Medication Instructions:  No changes *If you need a refill on your cardiac medications before your next appointment, please call your pharmacy*  Lab Work: none If you have labs (blood work) drawn today and your tests are completely normal, you will receive your results only by: Marland Kitchen MyChart Message (if you have MyChart) OR . A paper copy in the mail If you have any lab test that is abnormal or we need to change your treatment, we will call you to review the results.  Testing/Procedures: none3  Follow-Up: At [facility], you and your health needs are our priority.  As part of our continuing mission to provide you with exceptional heart care, we have created designated Provider Care Teams.  These Care Teams include your primary Cardiologist (physician) and Advanced Practice Providers (APPs -  Physician Assistants and Nurse Practitioners) who all work together to provide you with the care you need, when you need it.  Your next appointment:   6 months  The format for your next appointment:   In Person  Provider:   Lauree Chandler, MD  Other Instructions  Pt to lab today to repeat BMET per Dr. Cristy Folks

## 2018-12-02 NOTE — Progress Notes (Signed)
Chief Complaint  Patient presents with  . Follow-up    CAD   History of Present Illness: 78 yo male with history of CAD s/p 5V CABG 2005, paroxysmal atrial fibrillation/flutter, HTN, HLD, sinus node dysfunction s/p pacemaker implantation who is here today for cardiac follow up. His atrial fibrillation/flutter and pacemaker has been followed by Dr. Caryl Comes. He saw Dr. Caryl Comes in April 2013 and was started on dofetilide for atrial fibrillation. He had post-termination pauses and had a pacemaker implanted in April 2013. He has since had Tikosyn stopped due to cost and was started on sotalol. He is on Eliquis. He was seen in our office May 2016 by Richardson Dopp, PA-C with c/o chest pain. Stress myoview was ordered but was cancelled by patient due to cost. I saw him in January 2017 and he c/o daily sharp chest pains. Imdur was added. I saw him in August 2017 and he continued to have chest pain several days per week. Cardiac cath 09/22/15 with 3 patent grafts but could not visualize the other two grafts. The RCA was occluded proximally with no graft to this vessel. We discussed a cardiac CTA but he refused due to claustrophobia. Ranexa added to his regimen in December 2017. Admitted to [facility] in January 2020 with chest pain. Negative troponin. Echo January 2020 with LVEF=65-70%. Thickened aortic valve leaflets but no aortic stenosis. His chest pain was not felt to be cardiac related.   He is here today for follow up. The patient denies any dyspnea, palpitations, lower extremity edema, orthopnea, PND, dizziness, near syncope or syncope. He has rare chest pains when anxious. No exertional chest pain. Overall feeling well. Upcoming knee replacement in two weeks. He has hip pain after a fall.     Primary Care Physician: Verdell Carmine., MD   Past Medical History:  Diagnosis Date  . Anemia   . Angina   . Arthritis    "in my back"  . Atrial fibrillation/flutter   . BPH (benign prostatic hyperplasia)   . CAD  (coronary artery disease) Dec 2005   Hx MI. 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 Cataract    bilateral-removed 6-7 years ago  . Central obesity   . Chronic back pain   . Erectile dysfunction   . GERD (gastroesophageal reflux disease)   . Gout    "once; I have it under control w/medicine"  . History of anemia   . History of colon polyps   . Hyperlipidemia   . Hypertension   . IBS (irritable bowel syndrome)    with diarrhea  . Pacemaker   . Palpitations   . Sinus node dysfunction (HCC)    post termination pauses <5sec    Past Surgical History:  Procedure Laterality Date  . CARDIAC CATHETERIZATION  2005   "that what sent me to CABG"  . CARDIAC CATHETERIZATION N/A 09/22/2015   Procedure: Left Heart Cath and Cors/Grafts Angiography;  Surgeon: Burnell Blanks, MD;  Location: [place] [facility];  Service: Cardiovascular;  Laterality: N/A;  . CATARACT EXTRACTION W/ INTRAOCULAR LENS  IMPLANT, BILATERAL  ~ 01/2011  . CHOLECYSTECTOMY  1978   . COLONOSCOPY  2014   (Polypectomy) colonic polyps status post polypectomy. Pancolonic diverticulosis predominantly in the sigmoid colon. Small internal hemorrhoids. Patient also had perirectal excoriation  . CORONARY ARTERY BYPASS GRAFT  2005   in [place] - LIMA to LAD, SVG to diagonal, SVG  to ramus, SVG to OM and SVG to RCA   . PACEMAKER INSERTION  2014  . PERMANENT PACEMAKER INSERTION N/A 05/25/2011   Procedure: PERMANENT PACEMAKER INSERTION;  Surgeon: [name], MD;  Location: [facility];  Service: Cardiovascular;  Laterality: N/A;  . TRANSURETHRAL RESECTION OF PROSTATE N/A 12/29/2013   Procedure: TRANSURETHRAL RESECTION OF THE PROSTATE WITH GYRUS INSTRUMENTS;  Surgeon: [name], MD;  Location: [facility];  Service: Urology;  Laterality: N/A;    Current Outpatient Medications  Medication Sig Dispense Refill  . allopurinol (ZYLOPRIM) 300 MG tablet Take 300 mg by mouth  every morning.     Marland Kitchen apixaban (ELIQUIS) 5 MG TABS tablet Take 1 tablet (5 mg total) by mouth 2 (two) times daily. 180 tablet 1  . atorvastatin (LIPITOR) 40 MG tablet TAKE 1 TABLET AT BEDTIME    . carvedilol (COREG) 3.125 MG tablet TAKE 1 TABLET (3.125 MG TOTAL) BY MOUTH 2 (TWO) TIMES DAILY WITH A MEAL. 180 tablet 2  . isosorbide mononitrate (IMDUR) 30 MG 24 hr tablet TAKE 1 TABLET TWICE DAILY 180 tablet 2  . nitroGLYCERIN (NITROSTAT) 0.4 MG SL tablet DISSOLVE ONE TABLET UNDER THE TONGUE EVERY 5 MINUTES AS NEEDED FOR CHEST PAIN.  DO NOT EXCEED A TOTAL OF 3 DOSES IN 15 MINUTES 75 tablet 1  . ranolazine (RANEXA) 500 MG 12 hr tablet TAKE 1 TABLET TWICE DAILY 180 tablet 1  . sotalol (BETAPACE) 120 MG tablet TAKE 1 TABLET EVERY 12 HOURS 180 tablet 1  . vitamin B-12 (CYANOCOBALAMIN) 1000 MCG tablet Take 1,000 mcg by mouth at bedtime.     No current facility-administered medications for this visit.     Allergies  Allergen Reactions  . Sulfa Antibiotics Other (See Comments)    SWELLING ON PATIENTS LIPS, HE GETS DRIED UP  . Sulfamethoxazole Other (See Comments)    Unknown  . Sulfamethoxazole-Trimethoprim Other (See Comments)    Unknown    Social History   Socioeconomic History  . Marital status: Married    Spouse name: Not on file  . Number of children: 2  . Years of education: Not on file  . Highest education level: Not on file  Occupational History  . Occupation: retired-truck Education administrator: RETIRED  Social Needs  . Financial resource strain: Not on file  . Food insecurity    Worry: Not on file    Inability: Not on file  . Transportation needs    Medical: Not on file    Non-medical: Not on file  Tobacco Use  . Smoking status: Former Smoker    Packs/day: 0.00    Years: 0.00    Pack years: 0.00    Types: Cigarettes    Quit date: 01/30/1961    Years since quitting: 57.8  . Smokeless tobacco: Never Used  Substance and Sexual Activity  . Alcohol use: No    Comment: 2 drinks  (bourbon) a night  . Drug use: No  . Sexual activity: Yes  Lifestyle  . Physical activity    Days per week: Not on file    Minutes per session: Not on file  . Stress: Not on file  Relationships  . Social Herbalist on phone: Not on file    Gets together: Not on file    Attends religious service: Not on file    Active member of club or organization: Not on file    Attends meetings of clubs or organizations: Not on  file    Relationship status: Not on file  . Intimate partner violence    Fear of current or ex partner: Not on file    Emotionally abused: Not on file    Physically abused: Not on file    Forced sexual activity: Not on file  Other Topics Concern  . Not on file  Social History Narrative   Married. Pt works part-time for a [employer]. Quit tobacco 50 years ago..Both parents are deceased, neither one had cardiac issues and no siblings have cardiac issues.     Family History  Problem Relation Age of Onset  . Cancer Mother   . Stroke Brother   . Heart attack Neg Hx   . Colon cancer Neg Hx   . Colon polyps Neg Hx   . Heart disease Neg Hx   . Esophageal cancer Neg Hx   . Rectal cancer Neg Hx   . Stomach cancer Neg Hx     Review of Systems:  As stated in the HPI and otherwise negative.   BP 132/74   Pulse 60   Ht 5\' 6"  (1.676 m)   Wt 183 lb 6.4 oz (83.2 kg)   SpO2 99%   BMI 29.60 kg/m   Physical Examination:  General: Well developed, well nourished, NAD  HEENT: OP clear, mucus membranes moist  SKIN: warm, dry. No rashes. Neuro: No focal deficits  Musculoskeletal: Muscle strength 5/5 all ext  Psychiatric: Mood and affect normal  Neck: No JVD, no carotid bruits, no thyromegaly, no lymphadenopathy.  Lungs:Clear bilaterally, no wheezes, rhonci, crackles Cardiovascular: Regular rate and rhythm. No murmurs, gallops or rubs. Abdomen:Soft. Bowel sounds present. Non-tender.  Extremities: No lower extremity edema. Pulses are 2 + in the bilateral  DP/PT.   Echo January 2020: - Left ventricle: The cavity size was normal. Wall thickness was   normal. Systolic function was vigorous. The estimated ejection   fraction was in the range of 65% to 70%. Wall motion was normal;   there were no regional wall motion abnormalities. Doppler   parameters are consistent with abnormal left ventricular   relaxation (grade 1 diastolic dysfunction). - Aortic valve: Trileaflet; moderately thickened, moderately   calcified leaflets. - Right ventricle: Pacer wire or catheter noted in right ventricle.  EKG:  EKG is not ordered today. The ekg ordered today demonstrates   Recent Labs: 02/10/2018: ALT 30; B Natriuretic Peptide 141.6 11/21/2018: BUN 20; Creatinine, Ser 0.97; Hemoglobin 14.0; Magnesium 1.8; Platelets 202; Potassium CANCELED; Sodium 137   Lipid Panel Followed in primary care   Wt Readings from Last 3 Encounters:  12/02/18 183 lb 6.4 oz (83.2 kg)  11/21/18 185 lb (83.9 kg)  03/08/18 188 lb 6.4 oz (85.5 kg)     Other studies Reviewed: Additional studies/ records that were reviewed today include: . Review of the above records demonstrates:   Assessment and Plan:   1. CAD s/p CABG with angina: Last cardiac cath in August 2017 but I could not find two of his grafts. He has refused coronary CTA to better define his graft anatomy. Stable rare chest pains mostly when anxious.Will continue statin, beta blocker, Imdur and Ranexa. He has not been on an ASA since he is on Eliquis.   2. Atrial fibrillation, paroxysmal/Sick sinus syndrome: No palpitations. Followed by Dr. Caryl Comes. Pacemaker in place for post termination pauses/sinus node dysfunction. Continue Eliquis, beta blocker and sotalol.      3. HTN: BP is controlled.   4. HLD: His lipids  are followed in primary care. Goal LDL 70. Continue statin.   5. Pre-operative cardiovascular examination: He has no exertional chest pain. No signs of CHF. Heart rhythm issues are stable. No formal  request from surgery yet but I think he can proceed with his surgery. Will need to hold Eliquis 2 days prior to surgery.   Current medicines are reviewed at length with the patient today.  The patient does not have concerns regarding medicines.  The following changes have been made:  no change  Labs/ tests ordered today include:   No orders of the defined types were placed in this encounter.   Disposition:   FU with me in 12 months  Signed, Lauree Chandler, MD 12/02/2018 10:29 AM    [facility] [place], [place], [place]  [address] Phone: [phone]; Fax: [phone]

## 2018-12-03 ENCOUNTER — Other Ambulatory Visit: Payer: Self-pay | Admitting: Cardiovascular Disease

## 2018-12-06 ENCOUNTER — Encounter (HOSPITAL_COMMUNITY): Payer: Self-pay

## 2018-12-06 NOTE — Patient Instructions (Signed)
DUE TO COVID-19 ONLY ONE VISITOR IS ALLOWED TO COME WITH YOU AND STAY IN THE WAITING ROOM ONLY DURING PRE OP AND PROCEDURE DAY OF SURGERY. THE 1 VISITOR MAY VISIT WITH YOU AFTER SURGERY IN YOUR PRIVATE ROOM DURING VISITING HOURS ONLY!  YOU NEED TO HAVE A COVID 19 TEST ON_______ @_______ , THIS TEST MUST BE DONE BEFORE SURGERY, COME  [place] , [address].  ([place]) ONCE YOUR COVID TEST IS COMPLETED, PLEASE BEGIN THE QUARANTINE INSTRUCTIONS AS OUTLINED IN YOUR HANDOUT.                JAMIN SUDBECK  12/06/2018   Your procedure is scheduled on: 12-16-18   Report to [facility] Main  Entrance   Report to admitting at [place]  AM     Call this number if you have problems the morning of surgery [phone]    Remember: NO SOLID FOOD AFTER MIDNIGHT THE NIGHT PRIOR TO SURGERY. NOTHING BY MOUTH EXCEPT CLEAR LIQUIDS UNTIL    0700 am  . PLEASE FINISH ENSURE DRINK PER SURGEON ORDER  WHICH NEEDS TO BE COMPLETED AT      0700 am then nothing by mouth .     CLEAR LIQUID DIET   Foods Allowed                                                                     Foods Excluded  Coffee and tea, regular and decaf                             liquids that you cannot  Plain Jell-O any favor except red or purple                                           see through such as: Fruit ices (not with fruit pulp)                                     milk, soups, orange juice  Iced Popsicles                                    All solid food Carbonated beverages, regular and diet                                    Cranberry, grape and apple juices Sports drinks like Gatorade Lightly seasoned clear broth or consume(fat free) Sugar, honey syrup                                       _____________________________________________________________________    BRUSH YOUR TEETH MORNING OF SURGERY AND RINSE YOUR MOUTH OUT, NO CHEWING GUM CANDY OR MINTS.     Take these  medicines the morning of  surgery with A SIP OF WATER: sotalol, ranexa, imdur, carvedilol, allopurinol                                 You may not have any metal on your body including hair pins and              piercings  Do not wear jewelry,  lotions, powders or perfumes, deodorant                      Men may shave face and neck.   Do not bring valuables to the hospital. [place].  Contacts, dentures or bridgework may not be worn into surgery.      Special Instructions: N/A              Please read over the following fact sheets you were given: _____________________________________________________________________             [facility] - Preparing for Surgery Before surgery, you can play an important role.  Because skin is not sterile, your skin needs to be as free of germs as possible.  You can reduce the number of germs on your skin by washing with CHG (chlorahexidine gluconate) soap before surgery.  CHG is an antiseptic cleaner which kills germs and bonds with the skin to continue killing germs even after washing. Please DO NOT use if you have an allergy to CHG or antibacterial soaps.  If your skin becomes reddened/irritated stop using the CHG and inform your nurse when you arrive at Short Stay. Do not shave (including legs and underarms) for at least 48 hours prior to the first CHG shower.  You may shave your face/neck. Please follow these instructions carefully:  1.  Shower with CHG Soap the night before surgery and the  morning of Surgery.  2.  If you choose to wash your hair, wash your hair first as usual with your  normal  shampoo.  3.  After you shampoo, rinse your hair and body thoroughly to remove the  shampoo.                           4.  Use CHG as you would any other liquid soap.  You can apply chg directly  to the skin and wash                       Gently with a scrungie or clean washcloth.  5.  Apply the CHG Soap to  your body ONLY FROM THE NECK DOWN.   Do not use on face/ open                           Wound or open sores. Avoid contact with eyes, ears mouth and genitals (private parts).                       Wash face,  Genitals (private parts) with your normal soap.             6.  Wash thoroughly, paying special attention to the area where your surgery  will be performed.  7.  Thoroughly rinse your body with warm water from the neck  down.  8.  DO NOT shower/wash with your normal soap after using and rinsing off  the CHG Soap.                9.  Pat yourself dry with a clean towel.            10.  Wear clean pajamas.            11.  Place clean sheets on your bed the night of your first shower and do not  sleep with pets. Day of Surgery : Do not apply any lotions/deodorants the morning of surgery.  Please wear clean clothes to the hospital/surgery center.  FAILURE TO FOLLOW THESE INSTRUCTIONS MAY RESULT IN THE CANCELLATION OF YOUR SURGERY PATIENT SIGNATURE_________________________________  NURSE SIGNATURE__________________________________  ________________________________________________________________________   Adam Phenix  An incentive spirometer is a tool that can help keep your lungs clear and active. This tool measures how well you are filling your lungs with each breath. Taking long deep breaths may help reverse or decrease the chance of developing breathing (pulmonary) problems (especially infection) following:  A long period of time when you are unable to move or be active. BEFORE THE PROCEDURE   If the spirometer includes an indicator to show your best effort, your nurse or respiratory therapist will set it to a desired goal.  If possible, sit up straight or lean slightly forward. Try not to slouch.  Hold the incentive spirometer in an upright position. INSTRUCTIONS FOR USE  1. Sit on the edge of your bed if possible, or sit up as far as you can in bed or on a  chair. 2. Hold the incentive spirometer in an upright position. 3. Breathe out normally. 4. Place the mouthpiece in your mouth and seal your lips tightly around it. 5. Breathe in slowly and as deeply as possible, raising the piston or the ball toward the top of the column. 6. Hold your breath for 3-5 seconds or for as long as possible. Allow the piston or ball to fall to the bottom of the column. 7. Remove the mouthpiece from your mouth and breathe out normally. 8. Rest for a few seconds and repeat Steps 1 through 7 at least 10 times every 1-2 hours when you are awake. Take your time and take a few normal breaths between deep breaths. 9. The spirometer may include an indicator to show your best effort. Use the indicator as a goal to work toward during each repetition. 10. After each set of 10 deep breaths, practice coughing to be sure your lungs are clear. If you have an incision (the cut made at the time of surgery), support your incision when coughing by placing a pillow or rolled up towels firmly against it. Once you are able to get out of bed, walk around indoors and cough well. You may stop using the incentive spirometer when instructed by your caregiver.  RISKS AND COMPLICATIONS  Take your time so you do not get dizzy or light-headed.  If you are in pain, you may need to take or ask for pain medication before doing incentive spirometry. It is harder to take a deep breath if you are having pain. AFTER USE  Rest and breathe slowly and easily.  It can be helpful to keep track of a log of your progress. Your caregiver can provide you with a simple table to help with this. If you are using the spirometer at home, follow these instructions: [place] IF:  You are having difficultly using the spirometer.  You have trouble using the spirometer as often as instructed.  Your pain medication is not giving enough relief while using the spirometer.  You develop fever of 100.5 F  (38.1 C) or higher. SEEK IMMEDIATE MEDICAL CARE IF:   You cough up bloody sputum that had not been present before.  You develop fever of 102 F (38.9 C) or greater.  You develop worsening pain at or near the incision site. MAKE SURE YOU:   Understand these instructions.  Will watch your condition.  Will get help right away if you are not doing well or get worse. Document Released: 05/29/2006 Document Revised: 04/10/2011 Document Reviewed: 07/30/2006 ExitCare Patient Information 2014 Exit[employer], [place].   ________________________________________________________________________  WHAT IS A BLOOD TRANSFUSION? Blood Transfusion Information  A transfusion is the replacement of blood or some of its parts. Blood is made up of multiple cells which provide different functions.  Red blood cells carry oxygen and are used for blood loss replacement.  White blood cells fight against infection.  Platelets control bleeding.  Plasma helps clot blood.  Other blood products are available for specialized needs, such as hemophilia or other clotting disorders. BEFORE THE TRANSFUSION  Who gives blood for transfusions?   Healthy volunteers who are fully evaluated to make sure their blood is safe. This is blood bank blood. Transfusion therapy is the safest it has ever been in the practice of medicine. Before blood is taken from a donor, a complete history is taken to make sure that person has no history of diseases nor engages in risky social behavior (examples are intravenous drug use or sexual activity with multiple partners). The donor's travel history is screened to minimize risk of transmitting infections, such as malaria. The donated blood is tested for signs of infectious diseases, such as HIV and hepatitis. The blood is then tested to be sure it is compatible with you in order to minimize the chance of a transfusion reaction. If you or a relative donates blood, this is often done in anticipation  of surgery and is not appropriate for emergency situations. It takes many days to process the donated blood. RISKS AND COMPLICATIONS Although transfusion therapy is very safe and saves many lives, the main dangers of transfusion include:   Getting an infectious disease.  Developing a transfusion reaction. This is an allergic reaction to something in the blood you were given. Every precaution is taken to prevent this. The decision to have a blood transfusion has been considered carefully by your caregiver before blood is given. Blood is not given unless the benefits outweigh the risks. AFTER THE TRANSFUSION  Right after receiving a blood transfusion, you will usually feel much better and more energetic. This is especially true if your red blood cells have gotten low (anemic). The transfusion raises the level of the red blood cells which carry oxygen, and this usually causes an energy increase.  The nurse administering the transfusion will monitor you carefully for complications. HOME CARE INSTRUCTIONS  No special instructions are needed after a transfusion. You may find your energy is better. Speak with your caregiver about any limitations on activity for underlying diseases you may have. SEEK MEDICAL CARE IF:   Your condition is not improving after your transfusion.  You develop redness or irritation at the intravenous (IV) site. SEEK IMMEDIATE MEDICAL CARE IF:  Any of the following symptoms occur over the next 12 hours:  Shaking chills.  You have a temperature  by mouth above 102 F (38.9 C), not controlled by medicine.  Chest, back, or muscle pain.  People around you feel you are not acting correctly or are confused.  Shortness of breath or difficulty breathing.  Dizziness and fainting.  You get a rash or develop hives.  You have a decrease in urine output.  Your urine turns a dark color or changes to pink, red, or brown. Any of the following symptoms occur over the next 10  days:  You have a temperature by mouth above 102 F (38.9 C), not controlled by medicine.  Shortness of breath.  Weakness after normal activity.  The white part of the eye turns yellow (jaundice).  You have a decrease in the amount of urine or are urinating less often.  Your urine turns a dark color or changes to pink, red, or brown. Document Released: 01/14/2000 Document Revised: 04/10/2011 Document Reviewed: 09/02/2007 [facility] Patient Information 2014 [place], [place].  _______________________________________________________________________

## 2018-12-11 ENCOUNTER — Other Ambulatory Visit: Payer: Self-pay

## 2018-12-11 ENCOUNTER — Encounter (HOSPITAL_COMMUNITY)
Admission: RE | Admit: 2018-12-11 | Discharge: 2018-12-11 | Disposition: A | Discharge status: Home / Self Care | Payer: Medicare HMO | Source: Ambulatory Visit | Attending: Orthopedic Surgery | Admitting: Orthopedic Surgery

## 2018-12-11 ENCOUNTER — Encounter (HOSPITAL_COMMUNITY): Payer: Self-pay

## 2018-12-11 DIAGNOSIS — Z7901 Long term (current) use of anticoagulants: Secondary | ICD-10-CM | POA: Insufficient documentation

## 2018-12-11 DIAGNOSIS — Z87891 Personal history of nicotine dependence: Secondary | ICD-10-CM | POA: Diagnosis not present

## 2018-12-11 DIAGNOSIS — Z9842 Cataract extraction status, left eye: Secondary | ICD-10-CM | POA: Insufficient documentation

## 2018-12-11 DIAGNOSIS — I495 Sick sinus syndrome: Secondary | ICD-10-CM | POA: Diagnosis not present

## 2018-12-11 DIAGNOSIS — Z950 Presence of cardiac pacemaker: Secondary | ICD-10-CM | POA: Insufficient documentation

## 2018-12-11 DIAGNOSIS — I2510 Atherosclerotic heart disease of native coronary artery without angina pectoris: Secondary | ICD-10-CM | POA: Diagnosis not present

## 2018-12-11 DIAGNOSIS — Z01818 Encounter for other preprocedural examination: Secondary | ICD-10-CM | POA: Diagnosis not present

## 2018-12-11 DIAGNOSIS — I4892 Unspecified atrial flutter: Secondary | ICD-10-CM | POA: Diagnosis not present

## 2018-12-11 DIAGNOSIS — Z9841 Cataract extraction status, right eye: Secondary | ICD-10-CM | POA: Diagnosis not present

## 2018-12-11 DIAGNOSIS — Z951 Presence of aortocoronary bypass graft: Secondary | ICD-10-CM | POA: Diagnosis not present

## 2018-12-11 DIAGNOSIS — E785 Hyperlipidemia, unspecified: Secondary | ICD-10-CM | POA: Diagnosis not present

## 2018-12-11 DIAGNOSIS — Z79899 Other long term (current) drug therapy: Secondary | ICD-10-CM | POA: Insufficient documentation

## 2018-12-11 DIAGNOSIS — K219 Gastro-esophageal reflux disease without esophagitis: Secondary | ICD-10-CM | POA: Diagnosis not present

## 2018-12-11 DIAGNOSIS — I480 Paroxysmal atrial fibrillation: Secondary | ICD-10-CM | POA: Insufficient documentation

## 2018-12-11 DIAGNOSIS — Z961 Presence of intraocular lens: Secondary | ICD-10-CM | POA: Diagnosis not present

## 2018-12-11 DIAGNOSIS — M1711 Unilateral primary osteoarthritis, right knee: Secondary | ICD-10-CM | POA: Diagnosis not present

## 2018-12-11 DIAGNOSIS — I10 Essential (primary) hypertension: Secondary | ICD-10-CM | POA: Diagnosis not present

## 2018-12-11 HISTORY — DX: Cardiac arrhythmia, unspecified: I49.9

## 2018-12-11 HISTORY — DX: Acute myocardial infarction, unspecified: I21.9

## 2018-12-11 LAB — SURGICAL PCR SCREEN
MRSA, PCR: NEGATIVE
Staphylococcus aureus: NEGATIVE

## 2018-12-11 LAB — COMPREHENSIVE METABOLIC PANEL
ALT: 18 U/L (ref 0–44)
AST: 22 U/L (ref 15–41)
Albumin: 4 g/dL (ref 3.5–5.0)
Alkaline Phosphatase: 88 U/L (ref 38–126)
Anion gap: 7 (ref 5–15)
BUN: 17 mg/dL (ref 8–23)
CO2: 27 mmol/L (ref 22–32)
Calcium: 9.2 mg/dL (ref 8.9–10.3)
Chloride: 102 mmol/L (ref 98–111)
Creatinine, Ser: 0.92 mg/dL (ref 0.61–1.24)
GFR calc Af Amer: >60 mL/min (ref >60)
GFR calc non Af Amer: >60 mL/min (ref >60)
Glucose, Bld: 92 mg/dL (ref 70–99)
Potassium: 4.7 mmol/L (ref 3.5–5.1)
Sodium: 136 mmol/L (ref 135–145)
Total Bilirubin: 1.2 mg/dL (ref 0.3–1.2)
Total Protein: 7.3 g/dL (ref 6.5–8.1)

## 2018-12-11 LAB — CBC WITH DIFFERENTIAL/PLATELET
Abs Immature Granulocytes: 0.02 10*3/uL (ref 0.00–0.07)
Basophils Absolute: 0 10*3/uL (ref 0.0–0.1)
Basophils Relative: 1 %
Eosinophils Absolute: 0.1 10*3/uL (ref 0.0–0.5)
Eosinophils Relative: 2 %
HCT: 45.2 % (ref 39.0–52.0)
Hemoglobin: 14.8 g/dL (ref 13.0–17.0)
Immature Granulocytes: 0 %
Lymphocytes Relative: 28 %
Lymphs Abs: 1.7 10*3/uL (ref 0.7–4.0)
MCH: 33.6 pg (ref 26.0–34.0)
MCHC: 32.7 g/dL (ref 30.0–36.0)
MCV: 102.5 fL — ABNORMAL HIGH (ref 80.0–100.0)
Monocytes Absolute: 0.6 10*3/uL (ref 0.1–1.0)
Monocytes Relative: 10 %
Neutro Abs: 3.7 10*3/uL (ref 1.7–7.7)
Neutrophils Relative %: 59 %
Platelets: 203 10*3/uL (ref 150–400)
RBC: 4.41 MIL/uL (ref 4.22–5.81)
RDW: 13.5 % (ref 11.5–15.5)
WBC: 6.1 10*3/uL (ref 4.0–10.5)
nRBC: 0 % (ref 0.0–0.2)

## 2018-12-11 NOTE — Telephone Encounter (Signed)
Did not need this encounter °

## 2018-12-11 NOTE — Progress Notes (Signed)
PCP - [name] lov 10-15-18 Cardiologist - Virl Axe and  Jim Desanctis lov 12-02-18  Clearance epic   Chest x-ray - 02-10-18 epic EKG - 11-21-18 epic Stress Test -  ECHO - 02-11-18 epic Cardiac Cath - 2017 Last device check 10-10-18 epic Pacer orders on chart  Sleep Study -  CPAP -   Fasting Blood Sugar -  Checks Blood Sugar _____ times a day  Blood Thinner Instructions:Elequis hold 3 days prior to surgery per Dr. Milton Ferguson per pt. Aspirin Instructions: Last Dose:12-08-18 because pt. Ran out of elequis  Anesthesia review: CABG   2005 in [place] ,PPM, CAD a fib, sss   Patient denies shortness of breath, fever, cough and chest pain at PAT appointment   Patient verbalized understanding of instructions that were given to them at the PAT appointment. Patient was also instructed that they will need to review over the PAT instructions again at home before surgery.

## 2018-12-12 ENCOUNTER — Other Ambulatory Visit (HOSPITAL_COMMUNITY)
Admission: RE | Admit: 2018-12-12 | Discharge: 2018-12-12 | Disposition: A | Discharge status: Home / Self Care | Payer: Medicare HMO | Source: Ambulatory Visit | Attending: Orthopedic Surgery | Admitting: Orthopedic Surgery

## 2018-12-12 DIAGNOSIS — Z20828 Contact with and (suspected) exposure to other viral communicable diseases: Secondary | ICD-10-CM | POA: Insufficient documentation

## 2018-12-12 DIAGNOSIS — Z01812 Encounter for preprocedural laboratory examination: Secondary | ICD-10-CM | POA: Insufficient documentation

## 2018-12-12 NOTE — Progress Notes (Signed)
Anesthesia Chart Review   Case: [id number] Date/Time: 12/16/18 0940   Procedure: TOTAL KNEE ARTHROPLASTY (Right Knee) - 75 mins needed for length of case   Anesthesia type: Spinal   Pre-op diagnosis: Primary Osteoarthritis Right Knee   Location: [facility]   Surgeon: Vickey Huger, MD      DISCUSSION:77 y.o. former smoker ([birth year]) with h/o CAD (CABG 2005), paroxysmal atrial fibrillation/flutter (on Eliquis), HLD, HTN, GERD, pacemaker in place, right knee OA scheduled for above procedure 12/16/2018 with Dr. Vickey Huger.   Pt last seen by cardiologist, Dr. Lauree Chandler, 12/02/2018.  Per OV note, "Pre-operative cardiovascular examination: He has no exertional chest pain. No signs of CHF. Heart rhythm issues are stable. No formal request from surgery yet but I think he can proceed with his surgery. Will need to hold Eliquis 2 days prior to surgery."  Anticipate pt can proceed with planned procedure barring acute status change.   VS: BP 138/79 (BP Location: Right Arm)   Pulse 68   Temp 36.8 C (Oral)   Resp 18   Ht 5\' 6"  (1.676 m)   Wt 82.4 kg   SpO2 99%   BMI 29.30 kg/m   PROVIDERS: Spry, Marsh Dolly., MD is PCP   Lauree Chandler, MD is Cardiologist  LABS: Labs reviewed: Acceptable for surgery. (all labs ordered are listed, but only abnormal results are displayed)  Labs Reviewed  CBC WITH DIFFERENTIAL/PLATELET - Abnormal; Notable for the following components:      Result Value   MCV 102.5 (*)    All other components within normal limits  SURGICAL PCR SCREEN  COMPREHENSIVE METABOLIC PANEL     IMAGES:   EKG: 11/21/2018 Rate 63 bpm Atrial-paced rhythm with prolonged AV conduction Nonspecific T wave abnormality  Prolonged QT  CV: Echo 02/11/2018 Study Conclusions  - Left ventricle: The cavity size was normal. Wall thickness was   normal. Systolic function was vigorous. The estimated ejection   fraction was in the range of 65% to 70%. Wall motion  was normal;   there were no regional wall motion abnormalities. Doppler   parameters are consistent with abnormal left ventricular   relaxation (grade 1 diastolic dysfunction). - Aortic valve: Trileaflet; moderately thickened, moderately   calcified leaflets. - Right ventricle: Pacer wire or catheter noted in right ventricle.  Cardiac Cath 09/22/2015  Prox RCA to Mid RCA lesion, 100 %stenosed.  Ost 2nd Diag lesion, 100 %stenosed.  SVG and is normal in caliber and anatomically normal.  LM lesion, 60 %stenosed.  Ost Cx lesion, 50 %stenosed.  Ost 1st Mrg lesion, 100 %stenosed.  SVG graft was visualized by angiography and is normal in caliber and anatomically normal.  Prox LAD lesion, 100 %stenosed.  LIMA graft was visualized by angiography and is normal in caliber and anatomically normal.  1st Diag lesion, 90 %stenosed.   1. Severe triple vessel CAD s/p ?5V CABG with 3/5 patent bypass grafts. [place]. The documentation in our record over the years states 5V CABG. No vein graft markers found today so I cannot exclude patency of 2 other vein grafts. Unable to identify vein grafts with aortic root angiogram.  2. Occluded mid LAD with filling of the mid and distal vessel from the patent LIMA graft 3. Occluded intermediate and diagonal branches which fill from the patent vein grafts.  4. Cannot identify a graft to the Diagonal or the RCA 5. Moderate left main stenosis supplying the moderate caliber  circumflex.  6. Dilatation of the aortic root.   Recommendations: No targets for PCI but cannot visualize 2 additional vein grafts that are reported in his medical record. Will arrange Coronary CTA to see if we can visualize additional bypass grafts and also to get a better estimate of the size of his aortic root.  Past Medical History:  Diagnosis Date  . Anemia   . Angina   . Arthritis    "in my back"  . Atrial fibrillation/flutter   . BPH (benign  prostatic hyperplasia)   . CAD (coronary artery disease) Dec 2005   Hx MI. 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 Cataract    bilateral-removed 6-7 years ago  . Central obesity   . Chronic back pain   . Dysrhythmia   . Erectile dysfunction   . GERD (gastroesophageal reflux disease)    pt. denies  . Gout    "once; I have it under control w/medicine"  . History of anemia   . History of colon polyps   . Hyperlipidemia   . Hypertension   . IBS (irritable bowel syndrome)    with diarrhea  . Myocardial infarction ([place])    pt. denies at preop  . Pacemaker   . Palpitations   . Sinus node dysfunction (HCC)    post termination pauses <5sec    Past Surgical History:  Procedure Laterality Date  . CARDIAC CATHETERIZATION  2005   "that what sent me to CABG"  . CARDIAC CATHETERIZATION N/A 09/22/2015   Procedure: Left Heart Cath and Cors/Grafts Angiography;  Surgeon: Burnell Blanks, MD;  Location: [facility];  Service: Cardiovascular;  Laterality: N/A;  . CATARACT EXTRACTION W/ INTRAOCULAR LENS  IMPLANT, BILATERAL  ~ 01/2011  . CHOLECYSTECTOMY  1978   . COLONOSCOPY  2014   (Polypectomy) colonic polyps status post polypectomy. Pancolonic diverticulosis predominantly in the sigmoid colon. Small internal hemorrhoids. Patient also had perirectal excoriation  . CORONARY ARTERY BYPASS GRAFT  2005   in [place] - LIMA to LAD, SVG to diagonal, SVG to ramus, SVG to OM and SVG to RCA   . EYE SURGERY     retina surgey right eye in [place]  . PACEMAKER INSERTION  2014  . PERMANENT PACEMAKER INSERTION N/A 05/25/2011   Procedure: PERMANENT PACEMAKER INSERTION;  Surgeon: [name], MD;  Location: [facility] CATH LAB;  Service: Cardiovascular;  Laterality: N/A;  . TRANSURETHRAL RESECTION OF PROSTATE N/A 12/29/2013   Procedure: TRANSURETHRAL RESECTION OF THE PROSTATE WITH GYRUS INSTRUMENTS;  Surgeon: [name], MD;  Location: [facility]  ORS;  Service: Urology;  Laterality: N/A;    MEDICATIONS: . allopurinol (ZYLOPRIM) 300 MG tablet  . apixaban (ELIQUIS) 5 MG TABS tablet  . atorvastatin (LIPITOR) 40 MG tablet  . carvedilol (COREG) 3.125 MG tablet  . isosorbide mononitrate (IMDUR) 30 MG 24 hr tablet  . nitroGLYCERIN (NITROSTAT) 0.4 MG SL tablet  . ranolazine (RANEXA) 500 MG 12 hr tablet  . sotalol (BETAPACE) 120 MG tablet  . vitamin B-12 (CYANOCOBALAMIN) 500 MCG tablet   No current facility-administered medications for this encounter.     Maia [facility] Pre-Surgical Testing [phone] 12/12/18  10:37 AM

## 2018-12-12 NOTE — Anesthesia Preprocedure Evaluation (Addendum)
Anesthesia Evaluation  Patient identified by MRN, date of birth, ID band Patient awake    Reviewed: Allergy & Precautions, NPO status , Patient's Chart, lab work & pertinent test results, reviewed documented beta blocker date and time   Airway Mallampati: II  TM Distance: >3 FB Neck ROM: Full    Dental  (+) Lower Dentures, Upper Dentures   Pulmonary former smoker,    breath sounds clear to auscultation       Cardiovascular hypertension, Pt. on medications and Pt. on home beta blockers (-) angina+ CAD, + Past MI and + CABG  + dysrhythmias Atrial Fibrillation + pacemaker  Rhythm:Regular Rate:Normal     Neuro/Psych negative neurological ROS     GI/Hepatic Neg liver ROS, GERD  ,  Endo/Other  negative endocrine ROS  Renal/GU negative Renal ROS     Musculoskeletal  (+) Arthritis ,   Abdominal   Peds  Hematology negative hematology ROS (+)   Anesthesia Other Findings   Reproductive/Obstetrics                            Anesthesia Physical Anesthesia Plan  ASA: III  Anesthesia Plan: Spinal   Post-op Pain Management:  Regional for Post-op pain   Induction: Intravenous  PONV Risk Score and Plan: 1 and Propofol infusion, Ondansetron and Treatment may vary due to age or medical condition  Airway Management Planned: Natural Airway and Simple Face Mask  Additional Equipment:   Intra-op Plan:   Post-operative Plan:   Informed Consent: I have reviewed the patients History and Physical, chart, labs and discussed the procedure including the risks, benefits and alternatives for the proposed anesthesia with the patient or authorized representative who has indicated his/her understanding and acceptance.     Dental advisory given  Plan Discussed with: CRNA  Anesthesia Plan Comments:        Anesthesia Quick Evaluation

## 2018-12-13 LAB — NOVEL CORONAVIRUS, NAA (HOSP ORDER, SEND-OUT TO REF LAB; TAT 18-24 HRS): SARS-CoV-2, NAA: NOT DETECTED

## 2018-12-15 MED ORDER — BUPIVACAINE LIPOSOME 1.3 % IJ SUSP
20.0000 mL | Freq: Once | INTRAMUSCULAR | Status: DC
  Filled 2018-12-15: qty 20

## 2018-12-16 ENCOUNTER — Ambulatory Visit (HOSPITAL_COMMUNITY): Payer: Medicare HMO | Admitting: Anesthesiology

## 2018-12-16 ENCOUNTER — Encounter (HOSPITAL_COMMUNITY): Payer: Self-pay

## 2018-12-16 ENCOUNTER — Ambulatory Visit (HOSPITAL_COMMUNITY): Payer: Medicare HMO | Admitting: Physician Assistant

## 2018-12-16 ENCOUNTER — Observation Stay (HOSPITAL_COMMUNITY)
Admission: RE | Admit: 2018-12-16 | Discharge: 2018-12-17 | Disposition: A | Discharge status: Home / Self Care | Payer: Medicare HMO | Attending: Orthopedic Surgery | Admitting: Orthopedic Surgery

## 2018-12-16 ENCOUNTER — Other Ambulatory Visit: Payer: Self-pay

## 2018-12-16 ENCOUNTER — Encounter (HOSPITAL_COMMUNITY)
Admission: RE | Disposition: A | Discharge status: Home / Self Care | Payer: Self-pay | Source: Home / Self Care | Attending: Orthopedic Surgery

## 2018-12-16 DIAGNOSIS — Z951 Presence of aortocoronary bypass graft: Secondary | ICD-10-CM | POA: Diagnosis not present

## 2018-12-16 DIAGNOSIS — E7800 Pure hypercholesterolemia, unspecified: Secondary | ICD-10-CM | POA: Diagnosis not present

## 2018-12-16 DIAGNOSIS — Z87891 Personal history of nicotine dependence: Secondary | ICD-10-CM | POA: Diagnosis not present

## 2018-12-16 DIAGNOSIS — I10 Essential (primary) hypertension: Secondary | ICD-10-CM | POA: Insufficient documentation

## 2018-12-16 DIAGNOSIS — E785 Hyperlipidemia, unspecified: Secondary | ICD-10-CM | POA: Diagnosis not present

## 2018-12-16 DIAGNOSIS — Z882 Allergy status to sulfonamides status: Secondary | ICD-10-CM | POA: Insufficient documentation

## 2018-12-16 DIAGNOSIS — M1711 Unilateral primary osteoarthritis, right knee: Secondary | ICD-10-CM | POA: Diagnosis not present

## 2018-12-16 DIAGNOSIS — E668 Other obesity: Secondary | ICD-10-CM | POA: Insufficient documentation

## 2018-12-16 DIAGNOSIS — Z950 Presence of cardiac pacemaker: Secondary | ICD-10-CM | POA: Diagnosis not present

## 2018-12-16 DIAGNOSIS — I252 Old myocardial infarction: Secondary | ICD-10-CM | POA: Diagnosis not present

## 2018-12-16 DIAGNOSIS — N400 Enlarged prostate without lower urinary tract symptoms: Secondary | ICD-10-CM | POA: Insufficient documentation

## 2018-12-16 DIAGNOSIS — Z6829 Body mass index (BMI) 29.0-29.9, adult: Secondary | ICD-10-CM | POA: Diagnosis not present

## 2018-12-16 DIAGNOSIS — Z881 Allergy status to other antibiotic agents status: Secondary | ICD-10-CM | POA: Insufficient documentation

## 2018-12-16 DIAGNOSIS — Z79899 Other long term (current) drug therapy: Secondary | ICD-10-CM | POA: Insufficient documentation

## 2018-12-16 DIAGNOSIS — Z96659 Presence of unspecified artificial knee joint: Secondary | ICD-10-CM

## 2018-12-16 DIAGNOSIS — Z7901 Long term (current) use of anticoagulants: Secondary | ICD-10-CM | POA: Diagnosis not present

## 2018-12-16 DIAGNOSIS — I25110 Atherosclerotic heart disease of native coronary artery with unstable angina pectoris: Secondary | ICD-10-CM | POA: Diagnosis not present

## 2018-12-16 DIAGNOSIS — I495 Sick sinus syndrome: Secondary | ICD-10-CM | POA: Insufficient documentation

## 2018-12-16 DIAGNOSIS — M109 Gout, unspecified: Secondary | ICD-10-CM | POA: Insufficient documentation

## 2018-12-16 DIAGNOSIS — I4891 Unspecified atrial fibrillation: Secondary | ICD-10-CM | POA: Insufficient documentation

## 2018-12-16 HISTORY — PX: TOTAL KNEE ARTHROPLASTY: SHX125

## 2018-12-16 SURGERY — ARTHROPLASTY, KNEE, TOTAL
Anesthesia: Spinal | Site: Knee | Laterality: Right

## 2018-12-16 MED ORDER — TRANEXAMIC ACID-NACL 1000-0.7 MG/100ML-% IV SOLN
1000.0000 mg | INTRAVENOUS | Status: AC
  Administered 2018-12-16: 1000 mg via INTRAVENOUS
  Filled 2018-12-16: qty 100

## 2018-12-16 MED ORDER — PHENYLEPHRINE HCL-NACL 10-0.9 MG/250ML-% IV SOLN
INTRAVENOUS | Status: DC | PRN
  Administered 2018-12-16: 40 ug/min via INTRAVENOUS

## 2018-12-16 MED ORDER — BUPIVACAINE IN DEXTROSE 0.75-8.25 % IT SOLN
INTRATHECAL | Status: DC | PRN
  Administered 2018-12-16: 1.6 mL via INTRATHECAL

## 2018-12-16 MED ORDER — METOCLOPRAMIDE HCL 5 MG/ML IJ SOLN: 5.0000 mg | Freq: Three times a day (TID) | INTRAMUSCULAR | Status: DC | PRN

## 2018-12-16 MED ORDER — FENTANYL CITRATE (PF) 100 MCG/2ML IJ SOLN: 25.0000 ug | INTRAMUSCULAR | Status: DC | PRN

## 2018-12-16 MED ORDER — RANOLAZINE ER 500 MG PO TB12
500.0000 mg | ORAL_TABLET | Freq: Two times a day (BID) | ORAL | Status: DC
  Administered 2018-12-16 – 2018-12-17 (×2): 500 mg via ORAL
  Filled 2018-12-16 (×3): qty 1

## 2018-12-16 MED ORDER — DEXAMETHASONE SODIUM PHOSPHATE 10 MG/ML IJ SOLN
10.0000 mg | Freq: Once | INTRAMUSCULAR | Status: AC
  Administered 2018-12-17: 10 mg via INTRAVENOUS
  Filled 2018-12-16: qty 1

## 2018-12-16 MED ORDER — ONDANSETRON HCL 4 MG PO TABS: 4.0000 mg | ORAL_TABLET | Freq: Four times a day (QID) | ORAL | Status: DC | PRN

## 2018-12-16 MED ORDER — CEFAZOLIN SODIUM-DEXTROSE 2-4 GM/100ML-% IV SOLN
2.0000 g | INTRAVENOUS | Status: AC
  Administered 2018-12-16: 2 g via INTRAVENOUS
  Filled 2018-12-16: qty 100

## 2018-12-16 MED ORDER — DIPHENHYDRAMINE HCL 12.5 MG/5ML PO ELIX
12.5000 mg | ORAL_SOLUTION | ORAL | Status: DC | PRN
  Administered 2018-12-16: 25 mg via ORAL
  Filled 2018-12-16: qty 10

## 2018-12-16 MED ORDER — ACETAMINOPHEN 500 MG PO TABS
1000.0000 mg | ORAL_TABLET | Freq: Once | ORAL | Status: AC
  Administered 2018-12-16: 1000 mg via ORAL
  Filled 2018-12-16: qty 2

## 2018-12-16 MED ORDER — BUPIVACAINE LIPOSOME 1.3 % IJ SUSP
INTRAMUSCULAR | Status: DC | PRN
  Administered 2018-12-16: 20 mL

## 2018-12-16 MED ORDER — CHLORHEXIDINE GLUCONATE 4 % EX LIQD: 60.0000 mL | Freq: Once | CUTANEOUS | Status: DC

## 2018-12-16 MED ORDER — PROPOFOL 500 MG/50ML IV EMUL
INTRAVENOUS | Status: DC | PRN
  Administered 2018-12-16: 75 ug/kg/min via INTRAVENOUS

## 2018-12-16 MED ORDER — FLEET ENEMA 7-19 GM/118ML RE ENEM: 1.0000 | ENEMA | Freq: Once | RECTAL | Status: DC | PRN

## 2018-12-16 MED ORDER — TRANEXAMIC ACID-NACL 1000-0.7 MG/100ML-% IV SOLN
1000.0000 mg | Freq: Once | INTRAVENOUS | Status: AC
  Administered 2018-12-16: 1000 mg via INTRAVENOUS
  Filled 2018-12-16: qty 100

## 2018-12-16 MED ORDER — POVIDONE-IODINE 10 % EX SWAB
2.0000 "application " | Freq: Once | CUTANEOUS | Status: AC
  Administered 2018-12-16: 2 via TOPICAL

## 2018-12-16 MED ORDER — ALUM & MAG HYDROXIDE-SIMETH 200-200-20 MG/5ML PO SUSP: 30.0000 mL | ORAL | Status: DC | PRN

## 2018-12-16 MED ORDER — PHENYLEPHRINE HCL-NACL 10-0.9 MG/250ML-% IV SOLN: 0.0000 ug/min | INTRAVENOUS | Status: DC

## 2018-12-16 MED ORDER — TRAMADOL HCL 50 MG PO TABS
50.0000 mg | ORAL_TABLET | Freq: Four times a day (QID) | ORAL | Status: DC
  Administered 2018-12-16 – 2018-12-17 (×4): 50 mg via ORAL
  Filled 2018-12-16 (×4): qty 1

## 2018-12-16 MED ORDER — METHOCARBAMOL 500 MG PO TABS
500.0000 mg | ORAL_TABLET | Freq: Four times a day (QID) | ORAL | Status: DC | PRN
  Administered 2018-12-16: 500 mg via ORAL
  Filled 2018-12-16: qty 1

## 2018-12-16 MED ORDER — DOCUSATE SODIUM 100 MG PO CAPS
100.0000 mg | ORAL_CAPSULE | Freq: Two times a day (BID) | ORAL | Status: DC
  Administered 2018-12-16 – 2018-12-17 (×3): 100 mg via ORAL
  Filled 2018-12-16 (×3): qty 1

## 2018-12-16 MED ORDER — ROPIVACAINE HCL 5 MG/ML IJ SOLN
INTRAMUSCULAR | Status: DC | PRN
  Administered 2018-12-16: 20 mL via PERINEURAL

## 2018-12-16 MED ORDER — WATER FOR IRRIGATION, STERILE IR SOLN
Status: DC | PRN
  Administered 2018-12-16 (×2): 1000 mL

## 2018-12-16 MED ORDER — GABAPENTIN 300 MG PO CAPS
300.0000 mg | ORAL_CAPSULE | Freq: Once | ORAL | Status: AC
  Administered 2018-12-16: 300 mg via ORAL
  Filled 2018-12-16: qty 1

## 2018-12-16 MED ORDER — ONDANSETRON HCL 4 MG/2ML IJ SOLN: 4.0000 mg | Freq: Four times a day (QID) | INTRAMUSCULAR | Status: DC | PRN

## 2018-12-16 MED ORDER — MENTHOL 3 MG MT LOZG: 1.0000 | LOZENGE | OROMUCOSAL | Status: DC | PRN

## 2018-12-16 MED ORDER — SODIUM CHLORIDE 0.9 % IR SOLN
Status: DC | PRN
  Administered 2018-12-16: 1000 mL

## 2018-12-16 MED ORDER — CARVEDILOL 3.125 MG PO TABS
3.1250 mg | ORAL_TABLET | Freq: Two times a day (BID) | ORAL | Status: DC
  Administered 2018-12-16 – 2018-12-17 (×2): 3.125 mg via ORAL
  Filled 2018-12-16 (×2): qty 1

## 2018-12-16 MED ORDER — MIDAZOLAM HCL 2 MG/2ML IJ SOLN
1.0000 mg | Freq: Once | INTRAMUSCULAR | Status: DC
  Filled 2018-12-16: qty 2

## 2018-12-16 MED ORDER — CEFAZOLIN SODIUM-DEXTROSE 2-4 GM/100ML-% IV SOLN
2.0000 g | Freq: Four times a day (QID) | INTRAVENOUS | Status: AC
  Administered 2018-12-16 (×2): 2 g via INTRAVENOUS
  Filled 2018-12-16 (×2): qty 100

## 2018-12-16 MED ORDER — METOCLOPRAMIDE HCL 5 MG PO TABS: 5.0000 mg | ORAL_TABLET | Freq: Three times a day (TID) | ORAL | Status: DC | PRN

## 2018-12-16 MED ORDER — APIXABAN 5 MG PO TABS
5.0000 mg | ORAL_TABLET | Freq: Two times a day (BID) | ORAL | Status: DC
  Administered 2018-12-17: 5 mg via ORAL
  Filled 2018-12-16: qty 1

## 2018-12-16 MED ORDER — PROPOFOL 500 MG/50ML IV EMUL
INTRAVENOUS | Status: AC
  Filled 2018-12-16: qty 50

## 2018-12-16 MED ORDER — SOTALOL HCL 120 MG PO TABS
120.0000 mg | ORAL_TABLET | Freq: Two times a day (BID) | ORAL | Status: DC
  Administered 2018-12-16 – 2018-12-17 (×2): 120 mg via ORAL
  Filled 2018-12-16 (×3): qty 1

## 2018-12-16 MED ORDER — ATORVASTATIN CALCIUM 40 MG PO TABS
40.0000 mg | ORAL_TABLET | Freq: Every day | ORAL | Status: DC
  Administered 2018-12-16: 40 mg via ORAL
  Filled 2018-12-16: qty 1

## 2018-12-16 MED ORDER — BUPIVACAINE-EPINEPHRINE 0.25% -1:200000 IJ SOLN
INTRAMUSCULAR | Status: DC | PRN
  Administered 2018-12-16: 20 mL

## 2018-12-16 MED ORDER — SODIUM CHLORIDE 0.9 % IV SOLN
INTRAVENOUS | Status: DC
  Administered 2018-12-16: 15:00:00 via INTRAVENOUS

## 2018-12-16 MED ORDER — OXYCODONE HCL 5 MG PO TABS
5.0000 mg | ORAL_TABLET | ORAL | Status: DC | PRN
  Administered 2018-12-16: 5 mg via ORAL
  Filled 2018-12-16 (×2): qty 1

## 2018-12-16 MED ORDER — BUPIVACAINE-EPINEPHRINE 0.25% -1:200000 IJ SOLN
INTRAMUSCULAR | Status: AC
  Filled 2018-12-16: qty 1

## 2018-12-16 MED ORDER — ONDANSETRON HCL 4 MG/2ML IJ SOLN
INTRAMUSCULAR | Status: DC | PRN
  Administered 2018-12-16: 4 mg via INTRAVENOUS

## 2018-12-16 MED ORDER — HYDROMORPHONE HCL 1 MG/ML IJ SOLN: 0.5000 mg | INTRAMUSCULAR | Status: DC | PRN

## 2018-12-16 MED ORDER — FERROUS SULFATE 325 (65 FE) MG PO TABS
325.0000 mg | ORAL_TABLET | Freq: Three times a day (TID) | ORAL | Status: DC
  Administered 2018-12-16 – 2018-12-17 (×3): 325 mg via ORAL
  Filled 2018-12-16 (×3): qty 1

## 2018-12-16 MED ORDER — PANTOPRAZOLE SODIUM 40 MG PO TBEC
40.0000 mg | DELAYED_RELEASE_TABLET | Freq: Every day | ORAL | Status: DC
  Administered 2018-12-16 – 2018-12-17 (×2): 40 mg via ORAL
  Filled 2018-12-16 (×2): qty 1

## 2018-12-16 MED ORDER — SODIUM CHLORIDE 0.9% FLUSH
INTRAVENOUS | Status: DC | PRN
  Administered 2018-12-16: 20 mL

## 2018-12-16 MED ORDER — METHOCARBAMOL 500 MG IVPB - SIMPLE MED
500.0000 mg | Freq: Four times a day (QID) | INTRAVENOUS | Status: DC | PRN
  Filled 2018-12-16: qty 50

## 2018-12-16 MED ORDER — PHENYLEPHRINE HCL (PRESSORS) 10 MG/ML IV SOLN
INTRAVENOUS | Status: AC
  Filled 2018-12-16: qty 1

## 2018-12-16 MED ORDER — SODIUM CHLORIDE (PF) 0.9 % IJ SOLN
INTRAMUSCULAR | Status: AC
  Filled 2018-12-16: qty 20

## 2018-12-16 MED ORDER — LACTATED RINGERS IV SOLN
INTRAVENOUS | Status: DC
  Administered 2018-12-16 (×2): via INTRAVENOUS

## 2018-12-16 MED ORDER — PROMETHAZINE HCL 25 MG/ML IJ SOLN: 6.2500 mg | INTRAMUSCULAR | Status: DC | PRN

## 2018-12-16 MED ORDER — SODIUM CHLORIDE 0.9 % IR SOLN
Status: DC | PRN
  Administered 2018-12-16 (×2): 1000 mL

## 2018-12-16 MED ORDER — FENTANYL CITRATE (PF) 100 MCG/2ML IJ SOLN
50.0000 ug | Freq: Once | INTRAMUSCULAR | Status: AC
  Administered 2018-12-16: 50 ug via INTRAVENOUS
  Filled 2018-12-16: qty 2

## 2018-12-16 MED ORDER — ALLOPURINOL 300 MG PO TABS
300.0000 mg | ORAL_TABLET | Freq: Every day | ORAL | Status: DC
  Administered 2018-12-17: 300 mg via ORAL
  Filled 2018-12-16: qty 1

## 2018-12-16 MED ORDER — ONDANSETRON HCL 4 MG/2ML IJ SOLN
INTRAMUSCULAR | Status: AC
  Filled 2018-12-16: qty 2

## 2018-12-16 MED ORDER — DEXAMETHASONE SODIUM PHOSPHATE 10 MG/ML IJ SOLN
INTRAMUSCULAR | Status: AC
  Filled 2018-12-16: qty 1

## 2018-12-16 MED ORDER — DEXAMETHASONE SODIUM PHOSPHATE 10 MG/ML IJ SOLN
8.0000 mg | Freq: Once | INTRAMUSCULAR | Status: AC
  Administered 2018-12-16: 8 mg via INTRAVENOUS

## 2018-12-16 MED ORDER — PHENOL 1.4 % MT LIQD: 1.0000 | OROMUCOSAL | Status: DC | PRN

## 2018-12-16 MED ORDER — GABAPENTIN 300 MG PO CAPS
300.0000 mg | ORAL_CAPSULE | Freq: Three times a day (TID) | ORAL | Status: DC
  Administered 2018-12-16 – 2018-12-17 (×3): 300 mg via ORAL
  Filled 2018-12-16 (×3): qty 1

## 2018-12-16 MED ORDER — ACETAMINOPHEN 500 MG PO TABS
1000.0000 mg | ORAL_TABLET | Freq: Four times a day (QID) | ORAL | Status: AC
  Administered 2018-12-16 – 2018-12-17 (×4): 1000 mg via ORAL
  Filled 2018-12-16 (×4): qty 2

## 2018-12-16 MED ORDER — ISOSORBIDE MONONITRATE ER 30 MG PO TB24
30.0000 mg | ORAL_TABLET | Freq: Two times a day (BID) | ORAL | Status: DC
  Administered 2018-12-16 – 2018-12-17 (×2): 30 mg via ORAL
  Filled 2018-12-16 (×3): qty 1

## 2018-12-16 MED ORDER — ZOLPIDEM TARTRATE 5 MG PO TABS: 5.0000 mg | ORAL_TABLET | Freq: Every evening | ORAL | Status: DC | PRN

## 2018-12-16 MED ORDER — SENNOSIDES-DOCUSATE SODIUM 8.6-50 MG PO TABS: 1.0000 | ORAL_TABLET | Freq: Every evening | ORAL | Status: DC | PRN

## 2018-12-16 MED ORDER — BISACODYL 5 MG PO TBEC: 5.0000 mg | DELAYED_RELEASE_TABLET | Freq: Every day | ORAL | Status: DC | PRN

## 2018-12-16 SURGICAL SUPPLY — 63 items
ARTISURF 10M PLYR 10-11EF KNEE (Knees) ×2 IMPLANT
BAG SPEC THK2 15X12 ZIP CLS (MISCELLANEOUS) ×1
BAG ZIPLOCK 12X15 (MISCELLANEOUS) ×3 IMPLANT
BLADE SAGITTAL 13X1.27X60 (BLADE) ×2 IMPLANT
BLADE SAGITTAL 13X1.27X60MM (BLADE) ×1
BLADE SAW SGTL 83.5X18.5 (BLADE) ×3 IMPLANT
BLADE SURG 15 STRL LF DISP TIS (BLADE) ×1 IMPLANT
BLADE SURG 15 STRL SS (BLADE) ×3
BLADE SURG SZ10 CARB STEEL (BLADE) ×6 IMPLANT
BNDG ELASTIC 6X5.8 VLCR STR LF (GAUZE/BANDAGES/DRESSINGS) ×3 IMPLANT
BOWL SMART MIX CTS (DISPOSABLE) ×3 IMPLANT
BSPLAT TIB 5D F CMNT STM RT (Knees) ×1 IMPLANT
CEMENT BONE SIMPLEX SPEEDSET (Cement) ×6 IMPLANT
CLOSURE STERI-STRIP 1/2X4 (GAUZE/BANDAGES/DRESSINGS) ×1
CLOSURE WOUND 1/2 X4 (GAUZE/BANDAGES/DRESSINGS) ×1
CLSR STERI-STRIP ANTIMIC 1/2X4 (GAUZE/BANDAGES/DRESSINGS) ×1 IMPLANT
COVER SURGICAL LIGHT HANDLE (MISCELLANEOUS) ×3 IMPLANT
COVER WAND RF STERILE (DRAPES) IMPLANT
CUFF TOURN SGL QUICK 34 (TOURNIQUET CUFF) ×3
CUFF TRNQT CYL 34X4.125X (TOURNIQUET CUFF) ×1 IMPLANT
DECANTER SPIKE VIAL GLASS SM (MISCELLANEOUS) ×6 IMPLANT
DRAPE INCISE IOBAN 66X45 STRL (DRAPES) ×6 IMPLANT
DRAPE U-SHAPE 47X51 STRL (DRAPES) ×3 IMPLANT
DRSG AQUACEL AG ADV 3.5X10 (GAUZE/BANDAGES/DRESSINGS) ×3 IMPLANT
DURAPREP 26ML APPLICATOR (WOUND CARE) ×6 IMPLANT
ELECT REM PT RETURN 15FT ADLT (MISCELLANEOUS) ×3 IMPLANT
FEMUR  CMT CCR STD SZ10 R KNEE (Knees) ×2 IMPLANT
FEMUR CMT CCR STD SZ10 R KNEE (Knees) ×1 IMPLANT
FEMUR CMTD CCR STD SZ10 R KNEE (Knees) IMPLANT
GLOVE BIOGEL M STRL SZ7.5 (GLOVE) ×3 IMPLANT
GLOVE BIOGEL PI IND STRL 7.5 (GLOVE) ×1 IMPLANT
GLOVE BIOGEL PI IND STRL 8.5 (GLOVE) ×2 IMPLANT
GLOVE BIOGEL PI INDICATOR 7.5 (GLOVE) ×2
GLOVE BIOGEL PI INDICATOR 8.5 (GLOVE) ×4
GLOVE SURG ORTHO 8.0 STRL STRW (GLOVE) ×9 IMPLANT
GOWN STRL REUS W/ TWL XL LVL3 (GOWN DISPOSABLE) ×2 IMPLANT
GOWN STRL REUS W/TWL XL LVL3 (GOWN DISPOSABLE) ×6
HANDPIECE INTERPULSE COAX TIP (DISPOSABLE) ×3
HOLDER FOLEY CATH W/STRAP (MISCELLANEOUS) ×3 IMPLANT
HOOD PEEL AWAY FLYTE STAYCOOL (MISCELLANEOUS) ×9 IMPLANT
KIT TURNOVER KIT A (KITS) IMPLANT
MANIFOLD NEPTUNE II (INSTRUMENTS) ×3 IMPLANT
NEEDLE HYPO 22GX1.5 SAFETY (NEEDLE) ×3 IMPLANT
NS IRRIG 1000ML POUR BTL (IV SOLUTION) ×3 IMPLANT
PACK TOTAL KNEE CUSTOM (KITS) ×3 IMPLANT
PROTECTOR NERVE ULNAR (MISCELLANEOUS) ×3 IMPLANT
SET HNDPC FAN SPRY TIP SCT (DISPOSABLE) ×1 IMPLANT
STEM POLY PAT PLY 32M KNEE (Knees) ×2 IMPLANT
STEM TIBIA 5 DEG SZ F R KNEE (Knees) IMPLANT
STRIP CLOSURE SKIN 1/2X4 (GAUZE/BANDAGES/DRESSINGS) ×2 IMPLANT
SUT BONE WAX W31G (SUTURE) ×3 IMPLANT
SUT MNCRL AB 3-0 PS2 18 (SUTURE) ×3 IMPLANT
SUT STRATAFIX 0 PDS 27 VIOLET (SUTURE) ×3
SUT STRATAFIX PDS+ 0 24IN (SUTURE) ×3 IMPLANT
SUT VIC AB 1 CT1 36 (SUTURE) ×3 IMPLANT
SUTURE STRATFX 0 PDS 27 VIOLET (SUTURE) ×1 IMPLANT
SYR CONTROL 10ML LL (SYRINGE) ×6 IMPLANT
TIBIA STEM 5 DEG SZ F R KNEE (Knees) ×3 IMPLANT
TRAY FOLEY MTR SLVR 16FR STAT (SET/KITS/TRAYS/PACK) ×3 IMPLANT
TROCAR PINS ×2 IMPLANT
WATER STERILE IRR 1000ML POUR (IV SOLUTION) ×6 IMPLANT
WRAP KNEE MAXI GEL POST OP (GAUZE/BANDAGES/DRESSINGS) ×3 IMPLANT
YANKAUER SUCT BULB TIP 10FT TU (MISCELLANEOUS) ×3 IMPLANT

## 2018-12-16 NOTE — Evaluation (Addendum)
Physical Therapy Evaluation Patient Details Name: Dylan Roth MRN: [id number] DOB: [birth year] Today's Date: 12/16/2018   History of Present Illness  Pt is 78 yo male s/p R TKA on 12/16/18.  Clinical Impression  Pt is s/p R TKA resulting in the deficits listed below (see PT Problem List). Pt making good progress on DOS, able to ambulate 100' with min guard.  Good ROM for DOS.  Pt very motivated with excellent rehab potential.  Pt will benefit from skilled PT to increase their independence and safety with mobility to allow discharge to the venue listed below.      Follow Up Recommendations Follow surgeon's recommendation for DC plan and follow-up therapies    Equipment Recommendations  None recommended by PT    Recommendations for Other Services       Precautions / Restrictions Precautions Precautions: Fall Restrictions Weight Bearing Restrictions: Yes RLE Weight Bearing: Weight bearing as tolerated      Mobility  Bed Mobility Overal bed mobility: Needs Assistance Bed Mobility: Supine to Sit     Supine to sit: Supervision        Transfers Overall transfer level: Needs assistance Equipment used: Rolling walker (2 wheeled) Transfers: Sit to/from Stand Sit to Stand: Min guard         General transfer comment: cues for safe hand placement  Ambulation/Gait Ambulation/Gait assistance: Supervision Gait Distance (Feet): 100 Feet Assistive device: Rolling walker (2 wheeled) Gait Pattern/deviations: Decreased stance time - right;Decreased step length - right     General Gait Details: mild decrease in speed; partial reciprocal pattern  Stairs            Wheelchair Mobility    Modified Rankin (Stroke Patients Only)       Balance Overall balance assessment: Needs assistance Sitting-balance support: Feet supported;No upper extremity supported Sitting balance-Leahy Scale: Normal     Standing balance support: Bilateral upper extremity  supported;During functional activity Standing balance-Leahy Scale: Fair                               Pertinent Vitals/Pain Pain Assessment: 0-10 Pain Score: 6  Pain Location: R knee Pain Descriptors / Indicators: Discomfort Pain Intervention(s): Repositioned;Ice applied;Premedicated before session;Limited activity within patient's tolerance    Home Living Family/patient expects to be discharged to:: Private residence Living Arrangements: Spouse/significant other Available Help at Discharge: Family;Available 24 hours/day Type of Home: House Home Access: Stairs to enter Entrance Stairs-Rails: (In the middle) Entrance Stairs-Number of Steps: 4 Home Layout: One level Home Equipment: Walker - 2 wheels;Bedside commode;Cane - single point      Prior Function Level of Independence: Independent         Comments: Pt does report a fall about a month ago with pain in L hip that has limited him somewhat compared to baseline (not able to ambulate as far or lift as much)     Hand Dominance        Extremity/Trunk Assessment   Upper Extremity Assessment Upper Extremity Assessment: Overall WFL for tasks assessed(Overall [facility] except L shoulder elevation to ~85 degrees due to frozen shoulder)    Lower Extremity Assessment Lower Extremity Assessment: RLE deficits/detail RLE Deficits / Details: R ankle and hip ROM WFL; R knee lacks 5 ext 85 flex; R ankle DF MMT 5/5; R hip and knee 3/5       Communication   Communication: No difficulties  Cognition Arousal/Alertness: Awake/alert Behavior During Therapy: [facility]  for tasks assessed/performed Overall Cognitive Status: Within Functional Limits for tasks assessed                                        General Comments General comments (skin integrity, edema, etc.): VSS    Exercises  Performed 20 rep bil ankle pumps; 10 reps R quad set; 10 rep AAROM R heel slide   Assessment/Plan    PT Assessment Patient  needs continued PT services  PT Problem List Decreased strength;Decreased mobility;Decreased range of motion;Decreased activity tolerance;Decreased knowledge of use of DME;Decreased balance       PT Treatment Interventions DME instruction;Therapeutic activities;Gait training;Modalities;Therapeutic exercise;Patient/family education;Stair training;Balance training;Functional mobility training;Neuromuscular re-education    PT Goals (Current goals can be found in the Care Plan section)  Acute Rehab PT Goals Patient Stated Goal: return home PT Goal Formulation: With patient/family Time For Goal Achievement: 12/30/18 Potential to Achieve Goals: Good Additional Goals Additional Goal #1: Pt will have R knee ROM 0 to 90 degrees for gait and stairs    Frequency 7X/week   Barriers to discharge        Co-evaluation               AM-PAC PT "6 Clicks" Mobility  Outcome Measure Help needed turning from your back to your side while in a flat bed without using bedrails?: None Help needed moving from lying on your back to sitting on the side of a flat bed without using bedrails?: None Help needed moving to and from a bed to a chair (including a wheelchair)?: None Help needed standing up from a chair using your arms (e.g., wheelchair or bedside chair)?: None Help needed to walk in hospital room?: A Little Help needed climbing 3-5 steps with a railing? : A Little 6 Click Score: 22    End of Session Equipment Utilized During Treatment: Gait belt Activity Tolerance: Patient tolerated treatment well Patient left: in chair;with chair alarm set;with call bell/phone within reach;with family/visitor present Nurse Communication: Mobility status PT Visit Diagnosis: Other abnormalities of gait and mobility (R26.89);Muscle weakness (generalized) (M62.81)    Time: [id number] PT Time Calculation (min) (ACUTE ONLY): 33 min   Charges:   PT Evaluation $PT Eval Low Complexity: 1 Low PT  Treatments $Therapeutic Exercise: 8-22 mins        Maggie Font, PT Acute Rehab Services Pager [phone] [facility] [phone] [facility] [phone]   Dylan Roth 12/16/2018, 5:11 PM

## 2018-12-16 NOTE — Plan of Care (Signed)
Problem: Education: Goal: Knowledge of General Education information will improve Description: Including pain rating scale, medication(s)/side effects and non-pharmacologic comfort measures Outcome: Progressing   Problem: Health Behavior/Discharge Planning: Goal: Ability to manage health-related needs will improve Outcome: Progressing   Problem: Clinical Measurements: Goal: Ability to maintain clinical measurements within normal limits will improve Outcome: Progressing Goal: Will remain free from infection Outcome: Progressing Goal: Diagnostic test results will improve Outcome: Progressing Goal: Respiratory complications will improve Outcome: Progressing Goal: Cardiovascular complication will be avoided Outcome: Progressing   Problem: Activity: Goal: Risk for activity intolerance will decrease Outcome: Progressing   Problem: Nutrition: Goal: Adequate nutrition will be maintained Outcome: Progressing   Problem: Coping: Goal: Level of anxiety will decrease Outcome: Progressing   Problem: Elimination: Goal: Will not experience complications related to bowel motility Outcome: Progressing Goal: Will not experience complications related to urinary retention Outcome: Progressing   Problem: Pain Managment: Goal: General experience of comfort will improve Outcome: Progressing   Problem: Safety: Goal: Ability to remain free from injury will improve Outcome: Progressing   Problem: Skin Integrity: Goal: Risk for impaired skin integrity will decrease Outcome: Progressing   Problem: Education: Goal: Knowledge of the prescribed therapeutic regimen will improve Outcome: Progressing Goal: Individualized Educational Video(s) Outcome: Progressing   Problem: Activity: Goal: Ability to avoid complications of mobility impairment will improve Outcome: Progressing Goal: Range of joint motion will improve Outcome: Progressing   Problem: Clinical Measurements: Goal:  Postoperative complications will be avoided or minimized Outcome: Progressing   Problem: Pain Management: Goal: Pain level will decrease with appropriate interventions Outcome: Progressing   Problem: Skin Integrity: Goal: Will show signs of wound healing Outcome: Progressing   Problem: Education: Goal: Knowledge of General Education information will improve Description: Including pain rating scale, medication(s)/side effects and non-pharmacologic comfort measures Outcome: Progressing   Problem: Health Behavior/Discharge Planning: Goal: Ability to manage health-related needs will improve Outcome: Progressing   Problem: Clinical Measurements: Goal: Ability to maintain clinical measurements within normal limits will improve Outcome: Progressing Goal: Will remain free from infection Outcome: Progressing Goal: Diagnostic test results will improve Outcome: Progressing Goal: Respiratory complications will improve Outcome: Progressing Goal: Cardiovascular complication will be avoided Outcome: Progressing   Problem: Activity: Goal: Risk for activity intolerance will decrease Outcome: Progressing   Problem: Nutrition: Goal: Adequate nutrition will be maintained Outcome: Progressing   Problem: Coping: Goal: Level of anxiety will decrease Outcome: Progressing   Problem: Elimination: Goal: Will not experience complications related to bowel motility Outcome: Progressing Goal: Will not experience complications related to urinary retention Outcome: Progressing   Problem: Pain Managment: Goal: General experience of comfort will improve Outcome: Progressing   Problem: Safety: Goal: Ability to remain free from injury will improve Outcome: Progressing   Problem: Skin Integrity: Goal: Risk for impaired skin integrity will decrease Outcome: Progressing   Problem: Education: Goal: Knowledge of the prescribed therapeutic regimen will improve Outcome: Progressing Goal:  Individualized Educational Video(s) Outcome: Progressing   Problem: Activity: Goal: Ability to avoid complications of mobility impairment will improve Outcome: Progressing Goal: Range of joint motion will improve Outcome: Progressing   Problem: Clinical Measurements: Goal: Postoperative complications will be avoided or minimized Outcome: Progressing   Problem: Pain Management: Goal: Pain level will decrease with appropriate interventions Outcome: Progressing   Problem: Skin Integrity: Goal: Will show signs of wound healing Outcome: Progressing   Problem: Education: Goal: Knowledge of General Education information will improve Description: Including pain rating scale, medication(s)/side effects and non-pharmacologic comfort measures Outcome: Progressing  Problem: Health Behavior/Discharge Planning: Goal: Ability to manage health-related needs will improve Outcome: Progressing   Problem: Clinical Measurements: Goal: Ability to maintain clinical measurements within normal limits will improve Outcome: Progressing Goal: Will remain free from infection Outcome: Progressing Goal: Diagnostic test results will improve Outcome: Progressing Goal: Respiratory complications will improve Outcome: Progressing Goal: Cardiovascular complication will be avoided Outcome: Progressing   Problem: Activity: Goal: Risk for activity intolerance will decrease Outcome: Progressing   Problem: Nutrition: Goal: Adequate nutrition will be maintained Outcome: Progressing   Problem: Coping: Goal: Level of anxiety will decrease Outcome: Progressing   Problem: Elimination: Goal: Will not experience complications related to bowel motility Outcome: Progressing Goal: Will not experience complications related to urinary retention Outcome: Progressing   Problem: Pain Managment: Goal: General experience of comfort will improve Outcome: Progressing   Problem: Safety: Goal: Ability to remain  free from injury will improve Outcome: Progressing   Problem: Skin Integrity: Goal: Risk for impaired skin integrity will decrease Outcome: Progressing   Problem: Education: Goal: Knowledge of the prescribed therapeutic regimen will improve Outcome: Progressing Goal: Individualized Educational Video(s) Outcome: Progressing   Problem: Activity: Goal: Ability to avoid complications of mobility impairment will improve Outcome: Progressing Goal: Range of joint motion will improve Outcome: Progressing   Problem: Clinical Measurements: Goal: Postoperative complications will be avoided or minimized Outcome: Progressing   Problem: Pain Management: Goal: Pain level will decrease with appropriate interventions Outcome: Progressing   Problem: Skin Integrity: Goal: Will show signs of wound healing Outcome: Progressing

## 2018-12-16 NOTE — Anesthesia Procedure Notes (Signed)
Anesthesia Regional Block: Adductor canal block   Pre-Anesthetic Checklist: ,, timeout performed, Correct Patient, Correct Site, Correct Laterality, Correct Procedure, Correct Position, site marked, Risks and benefits discussed,  Surgical consent,  Pre-op evaluation,  At surgeon's request and post-op pain management  Laterality: Right  Prep: chloraprep       Needles:  Injection technique: Single-shot  Needle Type: Echogenic Needle     Needle Length: 9cm  Needle Gauge: 21     Additional Needles:   Procedures:,,,, ultrasound used (permanent image in chart),,,,  Narrative:  Start time: 12/16/2018 9:55 AM End time: 12/16/2018 9:59 AM Injection made incrementally with aspirations every 5 mL.  Performed by: Personally  Anesthesiologist: Suzette Battiest, MD

## 2018-12-16 NOTE — Progress Notes (Signed)
AssistedDr. Rob Fitzgerald with right, ultrasound guided, adductor canal block. Side rails up, monitors on throughout procedure. See vital signs in flow sheet. Tolerated Procedure well.  

## 2018-12-16 NOTE — Transfer of Care (Signed)
Immediate Anesthesia Transfer of Care Note  Patient: Dylan Roth  Procedure(s) Performed: Procedure(s) with comments: TOTAL KNEE ARTHROPLASTY (Right) - 75 mins needed for length of case  Patient Location: PACU  Anesthesia Type:Spinal  Level of Consciousness:  sedated, patient cooperative and responds to stimulation  Airway & Oxygen Therapy:Patient Spontanous Breathing and Patient connected to face mask oxgen  Post-op Assessment:  Report given to PACU RN and Post -op Vital signs reviewed and stable  Post vital signs:  Reviewed and stable  Last Vitals:  Vitals:   12/16/18 1005 12/16/18 1020  BP:  (!) 151/88  Pulse: 63 (!) 59  Resp: (!) 23 19  Temp:    SpO2: 123XX123 123XX123    Complications: No apparent anesthesia complications

## 2018-12-16 NOTE — H&P (Signed)
Dylan Roth MRN:  [id number] DOB/SEX:  1941-01-21/male  CHIEF COMPLAINT:  Painful right Knee  HISTORY: Patient is a 78 y.o. male presented with a history of pain in the right knee. Onset of symptoms was gradual starting a few years ago with gradually worsening course since that time. Patient has been treated conservatively with over-the-counter NSAIDs and activity modification. Patient currently rates pain in the knee at 10 out of 10 with activity. There is pain at night.  PAST MEDICAL HISTORY: Patient Active Problem List   Diagnosis Date Noted  . Preop cardiovascular exam 03/08/2018  . HTN (hypertension) 02/10/2018  . Gout 02/10/2018  . CAD (coronary artery disease) 02/10/2018  . 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 Hyperkalemia 02/10/2018  . Chest pain 02/10/2018  . Encounter for laboratory examination 09/24/2015  . Hematuria, gross 01/29/2014  . 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 Anticoagulant long-term use 05/23/2011  . Atrial fibrillation/flutter   . Sinus node dysfunction (HCC)   . HYPERCHOLESTEROLEMIA 10/16/2008  . Essential hypertension, benign 10/16/2008  . Coronary artery disease involving native coronary artery of native heart with unstable angina pectoris ([place]) 10/16/2008   Past Medical History:  Diagnosis Date  . Anemia   . Angina   . Arthritis    "in my back"  . Atrial fibrillation/flutter   . BPH (benign prostatic hyperplasia)   . CAD (coronary artery disease) Dec 2005   Hx MI. 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 Cataract    bilateral-removed 6-7 years ago  . Central obesity   . Chronic back pain   . Dysrhythmia   . Erectile dysfunction   . GERD (gastroesophageal reflux disease)    pt. denies  . Gout    "once; I have it under control w/medicine"  . History of anemia   . History of colon polyps   . Hyperlipidemia   . Hypertension   . IBS (irritable bowel  syndrome)    with diarrhea  . Myocardial infarction ([place])    pt. denies at preop  . Pacemaker   . Palpitations   . Sinus node dysfunction (HCC)    post termination pauses <5sec   Past Surgical History:  Procedure Laterality Date  . CARDIAC CATHETERIZATION  2005   "that what sent me to CABG"  . CARDIAC CATHETERIZATION N/A 09/22/2015   Procedure: Left Heart Cath and Cors/Grafts Angiography;  Surgeon: Burnell Blanks, MD;  Location: [facility];  Service: Cardiovascular;  Laterality: N/A;  . CATARACT EXTRACTION W/ INTRAOCULAR LENS  IMPLANT, BILATERAL  ~ 01/2011  . CHOLECYSTECTOMY  1978   . COLONOSCOPY  2014   (Polypectomy) colonic polyps status post polypectomy. Pancolonic diverticulosis predominantly in the sigmoid colon. Small internal hemorrhoids. Patient also had perirectal excoriation  . CORONARY ARTERY BYPASS GRAFT  2005   in [place] - [place] to LAD, SVG to diagonal, SVG to ramus, SVG to OM and SVG to RCA   . EYE SURGERY     retina surgey right eye in [place]  . PACEMAKER INSERTION  2014  . PERMANENT PACEMAKER INSERTION N/A 05/25/2011   Procedure: PERMANENT PACEMAKER INSERTION;  Surgeon: [name], MD;  Location: [facility];  Service: Cardiovascular;  Laterality: N/A;  . TRANSURETHRAL RESECTION OF PROSTATE N/A 12/29/2013   Procedure: TRANSURETHRAL RESECTION OF THE PROSTATE WITH GYRUS INSTRUMENTS;  Surgeon: [name], MD;  Location: [facility];  Service: Urology;  Laterality: N/A;     MEDICATIONS:   Medications Prior to Admission  Medication Sig Dispense Refill Last Dose  . allopurinol (ZYLOPRIM) 300 MG tablet Take 300 mg by mouth daily.    12/16/2018 at Unknown time  . apixaban (ELIQUIS) 5 MG TABS tablet Take 1 tablet (5 mg total) by mouth 2 (two) times daily. 180 tablet 1 12/08/2018  . atorvastatin (LIPITOR) 40 MG tablet Take 40 mg by mouth at bedtime.    12/15/2018 at Unknown time  . carvedilol (COREG) 3.125 MG tablet TAKE 1 TABLET  BY MOUTH 2 (TWO)  TIMES DAILY WITH A MEAL. (Patient taking differently: Take 3.125 mg by mouth 2 (two) times daily with a meal. ) 180 tablet 3 12/16/2018 at 0600  . isosorbide mononitrate (IMDUR) 30 MG 24 hr tablet TAKE 1 TABLET TWICE DAILY (Patient taking differently: Take 30 mg by mouth 2 (two) times daily. ) 180 tablet 2 12/16/2018 at 0600  . nitroGLYCERIN (NITROSTAT) 0.4 MG SL tablet DISSOLVE ONE TABLET UNDER THE TONGUE EVERY 5 MINUTES AS NEEDED FOR CHEST PAIN.&nbsp;&nbsp;DO NOT EXCEED A TOTAL OF 3 DOSES IN 15 MINUTES 75 tablet 1 Past Month at Unknown time  . ranolazine (RANEXA) 500 MG 12 hr tablet TAKE 1 TABLET TWICE DAILY (Patient taking differently: Take 500 mg by mouth 2 (two) times daily. ) 180 tablet 1 12/16/2018 at Unknown time  . sotalol (BETAPACE) 120 MG tablet TAKE 1 TABLET EVERY 12 HOURS (Patient taking differently: Take 120 mg by mouth 2 (two) times daily. ) 180 tablet 1 12/16/2018 at 0600  . vitamin B-12 (CYANOCOBALAMIN) 500 MCG tablet Take 500 mcg by mouth daily.   12/15/2018 at Unknown time    ALLERGIES:   Allergies  Allergen Reactions  . Sulfa Antibiotics Other (See Comments)    SWELLING ON PATIENTS LIPS, HE GETS DRIED UP  . Sulfamethoxazole Other (See Comments)    Unknown  . Sulfamethoxazole-Trimethoprim Other (See Comments)    Unknown    REVIEW OF SYSTEMS:  A comprehensive review of systems was negative except for: Musculoskeletal: positive for arthralgias and bone pain   FAMILY HISTORY:   Family History  Problem Relation Age of Onset  . Cancer Mother   . Stroke Brother   . Heart attack Neg Hx   . Colon cancer Neg Hx   . Colon polyps Neg Hx   . Heart disease Neg Hx   . Esophageal cancer Neg Hx   . Rectal cancer Neg Hx   . Stomach cancer Neg Hx     SOCIAL HISTORY:   Social History   Tobacco Use  . Smoking status: Former Smoker    Packs/day: 0.00    Years: 0.00    Pack years: 0.00    Types: Cigarettes    Quit date: 01/30/1961    Years since quitting: 57.9  . Smokeless  tobacco: Never Used  Substance Use Topics  . Alcohol use: No    Comment: 2 drinks (bourbon) a night     EXAMINATION:  Vital signs in last 24 hours: Temp:  [97.8 F (36.6 C)] 97.8 F (36.6 C) (11/16 0741) Pulse Rate:  [64] 64 (11/16 0741) Resp:  [19] 19 (11/16 0741) BP: (150)/(78) 150/78 (11/16 0741) SpO2:  [100 %] 100 % (11/16 0741)  BP (!) 150/78   Pulse 64   Temp 97.8 F (36.6 C) (Oral)   Resp 19   SpO2 100%   General Appearance:    Alert, cooperative, no distress, appears stated  age  Head:    Normocephalic, without obvious abnormality, atraumatic  Eyes:    PERRL, conjunctiva/corneas clear, EOM's intact, fundi    benign, both eyes       Ears:    Normal TM's and external ear canals, both ears  Nose:   Nares normal, septum midline, mucosa normal, no drainage    or sinus tenderness  Throat:   Lips, mucosa, and tongue normal; teeth and gums normal  Neck:   Supple, symmetrical, trachea midline, no adenopathy;       thyroid:  No enlargement/tenderness/nodules; no carotid   bruit or JVD  Back:     Symmetric, no curvature, ROM normal, no CVA tenderness  Lungs:     Clear to auscultation bilaterally, respirations unlabored  Chest wall:    No tenderness or deformity  Heart:    Regular rate and rhythm, S1 and S2 normal, no murmur, rub   or gallop  Abdomen:     Soft, non-tender, bowel sounds active all four quadrants,    no masses, no organomegaly  Genitalia:    Normal male without lesion, discharge or tenderness  Rectal:    Normal tone, normal prostate, no masses or tenderness;   guaiac negative stool  Extremities:   Extremities normal, atraumatic, no cyanosis or edema  Pulses:   2+ and symmetric all extremities  Skin:   Skin color, texture, turgor normal, no rashes or lesions  Lymph nodes:   Cervical, supraclavicular, and axillary nodes normal  Neurologic:   CNII-XII intact. Normal strength, sensation and reflexes      throughout    Musculoskeletal:  ROM 0-120, Ligaments  intact,  Imaging Review Plain radiographs demonstrate severe degenerative joint disease of the right knee. The overall alignment is neutral. The bone quality appears to be good for age and reported activity level.  Assessment/Plan: Primary osteoarthritis, right knee   The patient history, physical examination and imaging studies are consistent with advanced degenerative joint disease of the right knee. The patient has failed conservative treatment.  The clearance notes were reviewed.  After discussion with the patient it was felt that Total Knee Replacement was indicated. The procedure,  risks, and benefits of total knee arthroplasty were presented and reviewed. The risks including but not limited to aseptic loosening, infection, blood clots, vascular injury, stiffness, patella tracking problems complications among others were discussed. The patient acknowledged the explanation, agreed to proceed with the plan.  Preoperative templating of the joint replacement has been completed, documented, and submitted to the Operating Room personnel in order to optimize intra-operative equipment management.    Patient's anticipated LOS is less than 2 midnights, meeting these requirements: - Lives within 1 hour of care - Has a competent adult at home to recover with post-op recover - NO history of  - Chronic pain requiring opiods  - Diabetes  - Coronary Artery Disease  - Heart failure  - Heart attack  - Stroke  - DVT/VTE  - Cardiac arrhythmia  - Respiratory Failure/COPD  - Renal failure  - Anemia  - Advanced Liver disease       Donia Ast 12/16/2018, 8:46 AM

## 2018-12-16 NOTE — Anesthesia Procedure Notes (Signed)
Spinal  Patient location during procedure: OR Start time: 12/16/2018 10:49 AM End time: 12/16/2018 10:54 AM Reason for block: at surgeon's request Staffing Resident/CRNA: Anne Fu, CRNA Performed: resident/CRNA  Preanesthetic Checklist Completed: patient identified, site marked, surgical consent, pre-op evaluation, timeout performed, IV checked, risks and benefits discussed and monitors and equipment checked Spinal Block Patient position: sitting Prep: DuraPrep Patient monitoring: heart rate, continuous pulse ox and blood pressure Approach: midline Location: L4-5 Injection technique: single-shot Needle Needle type: Pencan  Needle gauge: 24 G Needle length: 9 cm Assessment Sensory level: T6 Additional Notes  Functioning IV was confirmed and monitors were applied. Expiration date of kit checked and confirmed. Sterile prep and drape, including hand hygiene and sterile gloves were used. The patient was positioned and the spine was prepped. The skin was anesthetized with lidocaine.  Free flow of clear CSF was obtained prior to injecting local anesthetic into the CSF X 1 attempt.  The spinal needle aspirated freely following injection.  The needle was carefully withdrawn. Patient tolerated procedure well, without complications. Loss of motor and sensory on exam post injection.

## 2018-12-16 NOTE — Op Note (Signed)
TOTAL KNEE REPLACEMENT OPERATIVE NOTE:  12/16/2018  1:26 PM  PATIENT:  Dylan Roth  78 y.o. male  PRE-OPERATIVE DIAGNOSIS:  Primary Osteoarthritis Right Knee  POST-OPERATIVE DIAGNOSIS:  Primary Osteoarthritis Right Knee  PROCEDURE:  Procedure(s): TOTAL KNEE ARTHROPLASTY  SURGEON:  Surgeon(s): Vickey Huger, MD  PHYSICIAN ASSISTANT:  Nehemiah Massed, PA-C  ANESTHESIA:   spinal  SPECIMEN: None  COUNTS:  Correct  TOURNIQUET:   Total Tourniquet Time Documented: Thigh (Right) - 4 minutes Thigh (Right) - 42 minutes Total: Thigh (Right) - 46 minutes   DICTATION:  Indication for procedure:    The patient is a 78 y.o. male who has failed conservative treatment for Primary Osteoarthritis Right Knee.  Informed consent was obtained prior to anesthesia. The risks versus benefits of the operation were explain and in a way the patient can, and did, understand.    Description of procedure:     The patient was taken to the operating room and placed under anesthesia.  The patient was positioned in the usual fashion taking care that all body parts were adequately padded and/or protected.  A tourniquet was applied and the leg prepped and draped in the usual sterile fashion.  The extremity was exsanguinated with the esmarch and tourniquet inflated to 350 mmHg.  Pre-operative range of motion was normal.     A midline incision approximately 6-7 inches long was made with a #10 blade.  A new blade was used to make a parapatellar arthrotomy going 2-3 cm into the quadriceps tendon, over the patella, and alongside the medial aspect of the patellar tendon.  A synovectomy was then performed with the #10 blade and forceps. I then elevated the deep MCL off the medial tibial metaphysis subperiosteally around to the semimembranosus attachment.    I everted the patella and used calipers to measure patellar thickness.  I used the reamer to ream down to appropriate thickness to recreate the native  thickness.  I then removed excess bone with the rongeur and sagittal saw.  I used the appropriately sized template and drilled the three lug holes.  I then put the trial in place and measured the thickness with the calipers to ensure recreation of the native thickness.  The trial was then removed and the patella subluxed and the knee brought into flexion.  A homan retractor was place to retract and protect the patella and lateral structures.  A Z-retractor was place medially to protect the medial structures.  The extra-medullary alignment system was used to make cut the tibial articular surface perpendicular to the anamotic axis of the tibia and in 3 degrees of posterior slope.  The cut surface and alignment jig was removed.  I then used the intramedullary alignment guide to make a  valgus cut on the distal femur.  I then marked out the epicondylar axis on the distal femur. I then used the anterior referencing sizer and measured the femur to be a size 10.  The 4-In-1 cutting block was screwed into place in external rotation matching the posterior condylar angle, making our cuts perpendicular to the epicondylar axis.  Anterior, posterior and chamfer cuts were made with the sagittal saw.  The cutting block and cut pieces were removed.  A lamina spreader was placed in 90 degrees of flexion.  The ACL, PCL, menisci, and posterior condylar osteophytes were removed.  A 10 mm spacer blocked was found to offer good flexion and extension gap balance after minimal in degree releasing.   The scoop retractor  was then placed and the femoral finishing block was pinned in place.  The small sagittal saw was used as well as the lug drill to finish the femur.  The block and cut surfaces were removed and the medullary canal hole filled with autograft bone from the cut pieces.  The tibia was delivered forward in deep flexion and external rotation.  A size F tray was selected and pinned into place centered on the medial 1/3 of  the tibial tubercle.  The reamer and keel was used to prepare the tibia through the tray.    I then trialed with the size 10 femur, size F tibia, a 10 mm insert and the 32 patella.  I had excellent flexion/extension gap balance, excellent patella tracking.  Flexion was full and beyond 120 degrees; extension was zero.  These components were chosen and the staff opened them to me on the back table while the knee was lavaged copiously and the cement mixed.  The soft tissue was infiltrated with 60cc of exparel 1.3% through a 21 gauge needle.  I cemented in the components and removed all excess cement.  The polyethylene tibial component was snapped into place and the knee placed in extension while cement was hardening.  The capsule was infilltrated with a 60cc exparel/marcaine/saline mixture.   Once the cement was hard, the tourniquet was let down.  Hemostasis was obtained.  The arthrotomy was closed using a #1 stratofix running suture.  The deep soft tissues were closed with #0 vicryls and the subcuticular layer closed with #2-0 vicryl.  The skin was reapproximated and closed with 3.0 Monocryl.  The wound was covered with steristrips, aquacel dressing, and a TED stocking.   The patient was then awakened, extubated, and taken to the recovery room in stable condition.  BLOOD LOSS:  0000000 COMPLICATIONS:  None.  PLAN OF CARE: Admit for overnight observation  PATIENT DISPOSITION:  PACU - hemodynamically stable.    Please fax a copy of this op note to my office at [phone] (please only include page 1 and 2 of the Case Information op note)

## 2018-12-17 ENCOUNTER — Encounter (HOSPITAL_COMMUNITY): Payer: Self-pay | Admitting: Orthopedic Surgery

## 2018-12-17 DIAGNOSIS — M1711 Unilateral primary osteoarthritis, right knee: Secondary | ICD-10-CM | POA: Diagnosis not present

## 2018-12-17 LAB — CBC
HCT: 38.8 % — ABNORMAL LOW (ref 39.0–52.0)
Hemoglobin: 12.6 g/dL — ABNORMAL LOW (ref 13.0–17.0)
MCH: 33.3 pg (ref 26.0–34.0)
MCHC: 32.5 g/dL (ref 30.0–36.0)
MCV: 102.6 fL — ABNORMAL HIGH (ref 80.0–100.0)
Platelets: 187 10*3/uL (ref 150–400)
RBC: 3.78 MIL/uL — ABNORMAL LOW (ref 4.22–5.81)
RDW: 13.3 % (ref 11.5–15.5)
WBC: 10.7 10*3/uL — ABNORMAL HIGH (ref 4.0–10.5)
nRBC: 0 % (ref 0.0–0.2)

## 2018-12-17 LAB — BASIC METABOLIC PANEL
Anion gap: 9 (ref 5–15)
BUN: 20 mg/dL (ref 8–23)
CO2: 23 mmol/L (ref 22–32)
Calcium: 8.6 mg/dL — ABNORMAL LOW (ref 8.9–10.3)
Chloride: 101 mmol/L (ref 98–111)
Creatinine, Ser: 1.02 mg/dL (ref 0.61–1.24)
GFR calc Af Amer: >60 mL/min (ref >60)
GFR calc non Af Amer: >60 mL/min (ref >60)
Glucose, Bld: 161 mg/dL — ABNORMAL HIGH (ref 70–99)
Potassium: 4.8 mmol/L (ref 3.5–5.1)
Sodium: 133 mmol/L — ABNORMAL LOW (ref 135–145)

## 2018-12-17 MED ORDER — OXYCODONE HCL 5 MG PO TABS: 5.0000 mg | ORAL_TABLET | ORAL | 0 refills | Status: DC | PRN

## 2018-12-17 MED ORDER — METHOCARBAMOL 500 MG PO TABS: 500.0000 mg | ORAL_TABLET | Freq: Four times a day (QID) | ORAL | 0 refills | Status: DC | PRN

## 2018-12-17 NOTE — Progress Notes (Signed)
SPORTS MEDICINE AND JOINT REPLACEMENT  Lara Mulch, MD    Carlyon Shadow, PA-C [address], [place],   [id number]                             [phone]   PROGRESS NOTE  Subjective:  negative for Chest Pain  negative for Shortness of Breath  negative for Nausea/Vomiting   negative for Calf Pain  negative for Bowel Movement   Tolerating Diet: yes         Patient reports pain as 3 on 0-10 scale.    Objective: Vital signs in last 24 hours:    Patient Vitals for the past 24 hrs:  BP Temp Temp src Pulse Resp SpO2 Height Weight  12/17/18 0608 105/71 97.7 F (36.5 C) Oral 60 17 100 % - -  12/17/18 0207 109/72 97.6 F (36.4 C) Oral 60 18 100 % - -  12/16/18 2100 (!) 153/82 (!) 97.5 F (36.4 C) Oral 63 14 100 % - -  12/16/18 1727 122/82 - - 63 - 100 % - -  12/16/18 1620 (!) 141/76 97.7 F (36.5 C) Oral 60 16 100 % - -  12/16/18 1515 123/86 97.9 F (36.6 C) Oral 60 16 100 % - -  12/16/18 1401 130/74 97.6 F (36.4 C) Oral (!) 59 18 100 % 5\' 6"  (1.676 m) 81.2 kg  12/16/18 1345 109/67 (!) 97.5 F (36.4 C) - 68 17 100 % - -  12/16/18 1330 110/67 - - (!) 59 15 100 % - -  12/16/18 1315 97/73 - - 64 17 98 % - -  12/16/18 1300 (!) 97/53 - - 66 (!) 22 99 % - -  12/16/18 1252 98/69 97.7 F (36.5 C) - 68 16 100 % - -  12/16/18 1020 (!) 151/88 - - (!) 59 19 100 % - -  12/16/18 1005 - - - 63 (!) 23 100 % - -  12/16/18 1000 (!) 165/87 - - (!) 59 17 100 % - -    @flow {1959:LAST@   Intake/Output from previous day:   11/16 0701 - 11/17 0700 In: 3650 [P.O.:1020; I.V.:2130] Out: 880 [Urine:830]   Intake/Output this shift:   No intake/output data recorded.   Intake/Output      11/16 0701 - 11/17 0700 11/17 0701 - 11/18 0700   P.O. 1020    I.V. (mL/kg) 2130 (26.2)    IV Piggyback 500    Total Intake(mL/kg) 3650 (45)    Urine (mL/kg/hr) 830    Blood 50    Total Output 880    Net +2770         Urine Occurrence 2 x       LABORATORY DATA: Recent Labs   12/11/18 1139 12/17/18 0314  WBC 6.1 10.7*  HGB 14.8 12.6*  HCT 45.2 38.8*  PLT 203 187   Recent Labs    12/11/18 1139 12/17/18 0314  NA 136 133*  K 4.7 4.8  CL 102 101  CO2 27 23  BUN 17 20  CREATININE 0.92 1.02  GLUCOSE 92 161*  CALCIUM 9.2 8.6*   Lab Results  Component Value Date   INR 2.6 03/08/2018   INR 4.2 (A) 02/22/2018   INR 2.28 02/11/2018    Examination:  General appearance: alert, cooperative and no distress Extremities: extremities normal, atraumatic, no cyanosis or edema  Wound Exam: clean, dry, intact   Drainage:  None: wound tissue  dry  Motor Exam: Quadriceps and Hamstrings Intact  Sensory Exam: Superficial Peroneal, Deep Peroneal and Tibial normal   Assessment:    1 Day Post-Op  Procedure(s) (LRB): TOTAL KNEE ARTHROPLASTY (Right)  ADDITIONAL DIAGNOSIS:  Active Problems:   S/P total knee replacement     Plan: Physical Therapy as ordered Weight Bearing as Tolerated (WBAT)  DVT Prophylaxis:  Aspirin  DISCHARGE PLAN: Home      Patient doing well, expect d/c home today  Patient's anticipated LOS is less than 2 midnights, meeting these requirements: - Lives within 1 hour of care - Has a competent adult at home to recover with post-op recover - NO history of  - Chronic pain requiring opiods  - Diabetes  - Coronary Artery Disease  - Heart failure  - Heart attack  - Stroke  - DVT/VTE  - Cardiac arrhythmia  - Respiratory Failure/COPD  - Renal failure  - Anemia  - Advanced Liver disease        Donia Ast 12/17/2018, 7:45 AM

## 2018-12-17 NOTE — Progress Notes (Signed)
Physical Therapy Treatment Patient Details Name: Dylan Roth MRN: [id number] DOB: Aug 03, 1940 Today's Date: 12/17/2018    History of Present Illness Pt is 78 yo male s/p R TKA on 12/16/18.    PT Comments    Pt is POD # 1 and is progressing well.  Pt demonstrates safe gait & transfers in order to return home from PT perspective once discharged by MD.  While in hospital, will continue to benefit from PT for skilled therapy to advance mobility and exercises.      Follow Up Recommendations  Follow surgeon's recommendation for DC plan and follow-up therapies     Equipment Recommendations  None recommended by PT    Recommendations for Other Services       Precautions / Restrictions Precautions Precautions: Fall Restrictions Weight Bearing Restrictions: Yes RLE Weight Bearing: Weight bearing as tolerated    Mobility  Bed Mobility               General bed mobility comments: in chair at arrival  Transfers Overall transfer level: Needs assistance Equipment used: Rolling walker (2 wheeled) Transfers: Sit to/from Stand Sit to Stand: Supervision            Ambulation/Gait Ambulation/Gait assistance: Supervision Gait Distance (Feet): 250 Feet Assistive device: Rolling walker (2 wheeled)       General Gait Details: mild decrease in speed; partial reciprocal pattern-progressed to full step through pattern with cues   Stairs Stairs: Yes Stairs assistance: Min guard Stair Management: Step to pattern;One rail Left Number of Stairs: 5 General stair comments: cued for sequence   Wheelchair Mobility    Modified Rankin (Stroke Patients Only)       Balance   Sitting-balance support: Feet supported;No upper extremity supported Sitting balance-Leahy Scale: Normal     Standing balance support: Bilateral upper extremity supported;During functional activity Standing balance-Leahy Scale: Good                              Cognition  Arousal/Alertness: Awake/alert   Overall Cognitive Status: Within Functional Limits for tasks assessed                                        Exercises Total Joint Exercises Ankle Circles/Pumps: AROM;Both;20 reps;Seated Quad Sets: AROM;10 reps;Right;Seated Towel Squeeze: AROM;Both;10 reps;Seated Short Arc Quad: AROM;Right;10 reps;Seated Heel Slides: AAROM;Right;10 reps;Seated Hip ABduction/ADduction: AROM;Right;10 reps;Seated Straight Leg Raises: AROM;10 reps;Right;Seated Knee Flexion: AROM;10 reps;Right;Seated Goniometric ROM: R knee lacks 5 ext; 90 flex    General Comments General comments (skin integrity, edema, etc.): Pt was on 2 LPM O2 at arrival with sats 99%; on RA sats maintained 96%; left on RA with continuous pulse oximeter      Pertinent Vitals/Pain Pain Assessment: No/denies pain    Home Living                      Prior Function            PT Goals (current goals can now be found in the care plan section) Progress towards PT goals: Progressing toward goals    Frequency    7X/week      PT Plan Current plan remains appropriate    Co-evaluation              AM-PAC PT "6 Clicks" Mobility   Outcome Measure  Help needed turning from your back to your side while in a flat bed without using bedrails?: None Help needed moving from lying on your back to sitting on the side of a flat bed without using bedrails?: None Help needed moving to and from a bed to a chair (including a wheelchair)?: None Help needed standing up from a chair using your arms (e.g., wheelchair or bedside chair)?: None Help needed to walk in hospital room?: None Help needed climbing 3-5 steps with a railing? : None 6 Click Score: 24    End of Session Equipment Utilized During Treatment: Gait belt Activity Tolerance: Patient tolerated treatment well Patient left: in chair;with chair alarm set;with call bell/phone within reach Nurse Communication:  Mobility status PT Visit Diagnosis: Other abnormalities of gait and mobility (R26.89);Muscle weakness (generalized) (M62.81)     Time: [id number] PT Time Calculation (min) (ACUTE ONLY): 30 min  Charges:  $Gait Training: 8-22 mins $Therapeutic Exercise: 8-22 mins                     Maggie Font, PT Acute Rehab Services Pager [phone] [facility] [phone]    Karlton Lemon 12/17/2018, 11:05 AM

## 2018-12-17 NOTE — Progress Notes (Signed)
CSW confirmed therapy plan with practice manager Myer Haff. Therapy Plan: Outpatient PT Has DME

## 2018-12-17 NOTE — Discharge Summary (Signed)
SPORTS MEDICINE & JOINT REPLACEMENT   Dylan Mulch, MD   Dylan Shadow, PA-C [place], [place], [place]  [address]                             [phone]  PATIENT ID: Dylan Roth        MRN:  [id number]          DOB/AGE: 77/23/1942 / 77 y.o.    DISCHARGE SUMMARY  ADMISSION DATE:    12/16/2018 DISCHARGE DATE:   12/17/2018   ADMISSION DIAGNOSIS: Primary Osteoarthritis Right Knee    DISCHARGE DIAGNOSIS:  Primary Osteoarthritis Right Knee    ADDITIONAL DIAGNOSIS: Active Problems:   S/P total knee replacement  Past Medical History:  Diagnosis Date  . Anemia   . Angina   . Arthritis    "in my back"  . Atrial fibrillation/flutter   . BPH (benign prostatic hyperplasia)   . CAD (coronary artery disease) Dec 2005   Hx MI. 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 Cataract    bilateral-removed 6-7 years ago  . Central obesity   . Chronic back pain   . Dysrhythmia   . Erectile dysfunction   . GERD (gastroesophageal reflux disease)    pt. denies  . Gout    "once; I have it under control w/medicine"  . History of anemia   . History of colon polyps   . Hyperlipidemia   . Hypertension   . IBS (irritable bowel syndrome)    with diarrhea  . Myocardial infarction ([facility])    pt. denies at preop  . Pacemaker   . Palpitations   . Sinus node dysfunction ([facility])    post termination pauses <5sec    PROCEDURE: Procedure(s): TOTAL KNEE ARTHROPLASTY on 12/16/2018  CONSULTS:    HISTORY:  See H&P in chart  HOSPITAL COURSE:  Dylan Roth is a 78 y.o. admitted on 12/16/2018 and found to have a diagnosis of Primary Osteoarthritis Right Knee.  After appropriate laboratory studies were obtained  they were taken to the operating room on 12/16/2018 and underwent Procedure(s): TOTAL KNEE ARTHROPLASTY.   They were given perioperative antibiotics:  Anti-infectives (From admission, onward)   Start     Dose/Rate  Route Frequency Ordered Stop   12/16/18 1700  ceFAZolin (ANCEF) IVPB 2g/100 mL premix     2 g 200 mL/hr over 30 Minutes Intravenous Every 6 hours 12/16/18 1404 12/16/18 2349   12/16/18 0745  ceFAZolin (ANCEF) IVPB 2g/100 mL premix     2 g 200 mL/hr over 30 Minutes Intravenous On call to O.R. 12/16/18 0743 12/16/18 1057    .  Patient given tranexamic acid IV or topical and exparel intra-operatively.  Tolerated the procedure well.    POD# 1: Vital signs were stable.  Patient denied Chest pain, shortness of breath, or calf pain.  Patient was started on Aspirin twice daily at 8am.  Consults to PT, OT, and care management were made.  The patient was weight bearing as tolerated.  CPM was placed on the operative leg 0-90 degrees for 6-8 hours a day. When out of the CPM, patient was placed in the foam block to achieve full extension. Incentive spirometry was taught.  Dressing was changed.       POD #2, Continued  PT for ambulation and exercise program.  IV saline locked.  O2  discontinued.    The remainder of the hospital course was dedicated to ambulation and strengthening.   The patient was discharged on 1 Day Post-Op in  Good condition.  Blood products given:none  DIAGNOSTIC STUDIES: Recent vital signs:  Patient Vitals for the past 24 hrs:  BP Temp Temp src Pulse Resp SpO2 Height Weight  12/17/18 0608 105/71 97.7 F (36.5 C) Oral 60 17 100 % - -  12/17/18 0207 109/72 97.6 F (36.4 C) Oral 60 18 100 % - -  12/16/18 2100 (!) 153/82 (!) 97.5 F (36.4 C) Oral 63 14 100 % - -  12/16/18 1727 122/82 - - 63 - 100 % - -  12/16/18 1620 (!) 141/76 97.7 F (36.5 C) Oral 60 16 100 % - -  12/16/18 1515 123/86 97.9 F (36.6 C) Oral 60 16 100 % - -  12/16/18 1401 130/74 97.6 F (36.4 C) Oral (!) 59 18 100 % 5\' 6"  (1.676 m) 81.2 kg  12/16/18 1345 109/67 (!) 97.5 F (36.4 C) - 68 17 100 % - -  12/16/18 1330 110/67 - - (!) 59 15 100 % - -  12/16/18 1315 97/73 - - 64 17 98 % - -  12/16/18 1300  (!) 97/53 - - 66 (!) 22 99 % - -  12/16/18 1252 98/69 97.7 F (36.5 C) - 68 16 100 % - -  12/16/18 1020 (!) 151/88 - - (!) 59 19 100 % - -  12/16/18 1005 - - - 63 (!) 23 100 % - -  12/16/18 1000 (!) 165/87 - - (!) 59 17 100 % - -       Recent laboratory studies: Recent Labs    12/11/18 1139 12/17/18 0314  WBC 6.1 10.7*  HGB 14.8 12.6*  HCT 45.2 38.8*  PLT 203 187   Recent Labs    12/11/18 1139 12/17/18 0314  NA 136 133*  K 4.7 4.8  CL 102 101  CO2 27 23  BUN 17 20  CREATININE 0.92 1.02  GLUCOSE 92 161*  CALCIUM 9.2 8.6*   Lab Results  Component Value Date   INR 2.6 03/08/2018   INR 4.2 (A) 02/22/2018   INR 2.28 02/11/2018     Recent Radiographic Studies :  No results found.  DISCHARGE INSTRUCTIONS: Discharge Instructions    Call MD / Call 911   Complete by: As directed    If you experience chest pain or shortness of breath, CALL 911 and be transported to the hospital emergency room.  If you develope a fever above 101 F, pus (white drainage) or increased drainage or redness at the wound, or calf pain, call your surgeon's office.   Constipation Prevention   Complete by: As directed    Drink plenty of fluids.  Prune juice may be helpful.  You may use a stool softener, such as Colace (over the counter) 100 mg twice a day.  Use MiraLax (over the counter) for constipation as needed.   Diet - low sodium heart healthy   Complete by: As directed    Discharge instructions   Complete by: As directed    INSTRUCTIONS AFTER JOINT REPLACEMENT   Remove items at home which could result in a fall. This includes throw rugs or furniture in walking pathways ICE to the affected joint every three hours while awake for 30 minutes at a time, for at least the first 3-5 days, and then as needed for pain and swelling.  Continue to use  ice for pain and swelling. You may notice swelling that will progress down to the foot and ankle.  This is normal after surgery.  Elevate your leg when you  are not up walking on it.   Continue to use the breathing machine you got in the hospital (incentive spirometer) which will help keep your temperature down.  It is common for your temperature to cycle up and down following surgery, especially at night when you are not up moving around and exerting yourself.  The breathing machine keeps your lungs expanded and your temperature down.   DIET:  As you were doing prior to hospitalization, we recommend a well-balanced diet.  DRESSING / WOUND CARE / SHOWERING  Keep the surgical dressing until follow up.  The dressing is water proof, so you can shower without any extra covering.  IF THE DRESSING FALLS OFF or the wound gets wet inside, change the dressing with sterile gauze.  Please use good hand washing techniques before changing the dressing.  Do not use any lotions or creams on the incision until instructed by your surgeon.    ACTIVITY  Increase activity slowly as tolerated, but follow the weight bearing instructions below.   No driving for 6 weeks or until further direction given by your physician.  You cannot drive while taking narcotics.  No lifting or carrying greater than 10 lbs. until further directed by your surgeon. Avoid periods of inactivity such as sitting longer than an hour when not asleep. This helps prevent blood clots.  You may return to work once you are authorized by your doctor.     WEIGHT BEARING   Weight bearing as tolerated with assist device (walker, cane, etc) as directed, use it as long as suggested by your surgeon or therapist, typically at least 4-6 weeks.   EXERCISES  Results after joint replacement surgery are often greatly improved when you follow the exercise, range of motion and muscle strengthening exercises prescribed by your doctor. Safety measures are also important to protect the joint from further injury. Any time any of these exercises cause you to have increased pain or swelling, decrease what you are  doing until you are comfortable again and then slowly increase them. If you have problems or questions, call your caregiver or physical therapist for advice.   Rehabilitation is important following a joint replacement. After just a few days of immobilization, the muscles of the leg can become weakened and shrink (atrophy).  These exercises are designed to build up the tone and strength of the thigh and leg muscles and to improve motion. Often times heat used for twenty to thirty minutes before working out will loosen up your tissues and help with improving the range of motion but do not use heat for the first two weeks following surgery (sometimes heat can increase post-operative swelling).   These exercises can be done on a training (exercise) mat, on the floor, on a table or on a bed. Use whatever works the best and is most comfortable for you.    Use music or television while you are exercising so that the exercises are a pleasant break in your day. This will make your life better with the exercises acting as a break in your routine that you can look forward to.   Perform all exercises about fifteen times, three times per day or as directed.  You should exercise both the operative leg and the other leg as well.   Exercises include:   [employer]  Sets - Tighten up the muscle on the front of the thigh (Quad) and hold for 5-10 seconds.   Straight Leg Raises - With your knee straight (if you were given a brace, keep it on), lift the leg to 60 degrees, hold for 3 seconds, and slowly lower the leg.  Perform this exercise against resistance later as your leg gets stronger.  Leg Slides: Lying on your back, slowly slide your foot toward your buttocks, bending your knee up off the floor (only go as far as is comfortable). Then slowly slide your foot back down until your leg is flat on the floor again.  Angel Wings: Lying on your back spread your legs to the side as far apart as you can without causing discomfort.   Hamstring Strength:  Lying on your back, push your heel against the floor with your leg straight by tightening up the muscles of your buttocks.  Repeat, but this time bend your knee to a comfortable angle, and push your heel against the floor.  You may put a pillow under the heel to make it more comfortable if necessary.   A rehabilitation program following joint replacement surgery can speed recovery and prevent re-injury in the future due to weakened muscles. Contact your doctor or a physical therapist for more information on knee rehabilitation.    CONSTIPATION  Constipation is defined medically as fewer than three stools per week and severe constipation as less than one stool per week.  Even if you have a regular bowel pattern at home, your normal regimen is likely to be disrupted due to multiple reasons following surgery.  Combination of anesthesia, postoperative narcotics, change in appetite and fluid intake all can affect your bowels.   YOU MUST use at least one of the following options; they are listed in order of increasing strength to get the job done.  They are all available over the counter, and you may need to use some, POSSIBLY even all of these options:    Drink plenty of fluids (prune juice may be helpful) and high fiber foods Colace 100 mg by mouth twice a day  Senokot for constipation as directed and as needed Dulcolax (bisacodyl), take with full glass of water  Miralax (polyethylene glycol) once or twice a day as needed.  If you have tried all these things and are unable to have a bowel movement in the first 3-4 days after surgery call either your surgeon or your primary doctor.    If you experience loose stools or diarrhea, hold the medications until you stool forms back up.  If your symptoms do not get better within 1 week or if they get worse, check with your doctor.  If you experience "the worst abdominal pain ever" or develop nausea or vomiting, please contact the office  immediately for further recommendations for treatment.   ITCHING:  If you experience itching with your medications, try taking only a single pain pill, or even half a pain pill at a time.  You can also use Benadryl over the counter for itching or also to help with sleep.   TED HOSE STOCKINGS:  Use stockings on both legs until for at least 2 weeks or as directed by physician office. They may be removed at night for sleeping.  MEDICATIONS:  See your medication summary on the "After Visit Summary" that nursing will review with you.  You may have some home medications which will be placed on hold until you complete the course of  blood thinner medication.  It is important for you to complete the blood thinner medication as prescribed.  PRECAUTIONS:  If you experience chest pain or shortness of breath - call 911 immediately for transfer to the hospital emergency department.   If you develop a fever greater that 101 F, purulent drainage from wound, increased redness or drainage from wound, foul odor from the wound/dressing, or calf pain - CONTACT YOUR SURGEON.                                                   FOLLOW-UP APPOINTMENTS:  If you do not already have a post-op appointment, please call the office for an appointment to be seen by your surgeon.  Guidelines for how soon to be seen are listed in your "After Visit Summary", but are typically between 1-4 weeks after surgery.  OTHER INSTRUCTIONS:   Knee Replacement:  Do not place pillow under knee, focus on keeping the knee straight while resting. CPM instructions: 0-90 degrees, 2 hours in the morning, 2 hours in the afternoon, and 2 hours in the evening. Place foam block, curve side up under heel at all times except when in CPM or when walking.  DO NOT modify, tear, cut, or change the foam block in any way.  MAKE SURE YOU:  Understand these instructions.  Get help right away if you are not doing well or get worse.    Thank you for letting us be a  part of your medical care team.  It is a privilege we respect greatly.  We hope these instructions will help you stay on track for a fast and full recovery!   Increase activity slowly as tolerated   Complete by: As directed       DISCHARGE MEDICATIONS:   Allergies as of 12/17/2018      Reactions   Sulfa Antibiotics Other (See Comments)   SWELLING ON PATIENTS LIPS, HE GETS DRIED UP   Sulfamethoxazole Other (See Comments)   Unknown   Sulfamethoxazole-trimethoprim Other (See Comments)   Unknown      Medication List    TAKE these medications   allopurinol 300 MG tablet Commonly known as: ZYLOPRIM Take 300 mg by mouth daily.   apixaban 5 MG Tabs tablet Commonly known as: Eliquis Take 1 tablet (5 mg total) by mouth 2 (two) times daily.   atorvastatin 40 MG tablet Commonly known as: LIPITOR Take 40 mg by mouth at bedtime.   carvedilol 3.125 MG tablet Commonly known as: COREG TAKE 1 TABLET  BY MOUTH 2 (TWO) TIMES DAILY WITH A MEAL. What changed: See the new instructions.   isosorbide mononitrate 30 MG 24 hr tablet Commonly known as: IMDUR TAKE 1 TABLET TWICE DAILY   methocarbamol 500 MG tablet Commonly known as: ROBAXIN Take 1-2 tablets (500-1,000 mg total) by mouth every 6 (six) hours as needed for muscle spasms.   nitroGLYCERIN 0.4 MG SL tablet Commonly known as: NITROSTAT DISSOLVE ONE TABLET UNDER THE TONGUE EVERY 5 MINUTES AS NEEDED FOR CHEST PAIN.&nbsp;&nbsp;DO NOT EXCEED A TOTAL OF 3 DOSES IN 15 MINUTES   oxyCODONE 5 MG immediate release tablet Commonly known as: Oxy IR/ROXICODONE Take 1 tablet (5 mg total) by mouth every 4 (four) hours as needed for moderate pain (pain score 4-6).   ranolazine 500 MG 12 hr tablet Commonly known as: RANEXA  TAKE 1 TABLET TWICE DAILY   sotalol 120 MG tablet Commonly known as: BETAPACE TAKE 1 TABLET EVERY 12 HOURS What changed: when to take this   vitamin B-12 500 MCG tablet Commonly known as: CYANOCOBALAMIN Take 500 mcg by  mouth daily.            Durable Medical Equipment  (From admission, onward)         Start     Ordered   12/16/18 1405  DME Walker rolling  Once    Question:  Patient needs a walker to treat with the following condition  Answer:  S/P total knee replacement   12/16/18 1404   12/16/18 1405  DME 3 n 1  Once     12/16/18 1404   12/16/18 1405  DME Bedside commode  Once    Question:  Patient needs a bedside commode to treat with the following condition  Answer:  S/P total knee replacement   12/16/18 1404          FOLLOW UP VISIT:    DISPOSITION: HOME VS. SNF  CONDITION:  Good   Donia Ast 12/17/2018, 7:48 AM

## 2018-12-17 NOTE — Anesthesia Postprocedure Evaluation (Signed)
Anesthesia Post Note  Patient: Dylan Roth  Procedure(s) Performed: TOTAL KNEE ARTHROPLASTY (Right Knee)     Patient location during evaluation: PACU Anesthesia Type: Spinal Level of consciousness: awake and alert Pain management: pain level controlled Vital Signs Assessment: post-procedure vital signs reviewed and stable Respiratory status: spontaneous breathing and respiratory function stable Cardiovascular status: blood pressure returned to baseline and stable Postop Assessment: spinal receding Anesthetic complications: no    Last Vitals:  Vitals:   12/17/18 0608 12/17/18 0916  BP: 105/71 100/78  Pulse: 60 60  Resp: 17 16  Temp: 36.5 C 36.4 C  SpO2: 100% 99%    Last Pain:  Vitals:   12/17/18 0938  TempSrc:   PainSc: 0-No pain                 Tiajuana Amass

## 2019-01-21 ENCOUNTER — Other Ambulatory Visit: Payer: Self-pay | Admitting: Cardiovascular Disease

## 2019-02-10 ENCOUNTER — Other Ambulatory Visit: Payer: Self-pay | Admitting: Cardiovascular Disease

## 2019-02-11 NOTE — Telephone Encounter (Signed)
Eliquis 5mg  refill request received, pt is 79yrs old, weight-81.2kg, Crea-1.02 on 12/17/2018, Diagnosis-Afib, and last seen by Dr. Angelena Form on 12/02/2018. Dose is appropriate based on dosing criteria. Will send in refill to requested pharmacy.

## 2019-03-17 ENCOUNTER — Other Ambulatory Visit: Payer: Self-pay | Admitting: Cardiovascular Disease

## 2019-04-30 ENCOUNTER — Telehealth: Payer: Self-pay | Admitting: *Deleted

## 2019-04-30 NOTE — Telephone Encounter (Signed)
LVM to speak with patient as he has not been seen since November of 2020 per Pre-op protocol.

## 2019-04-30 NOTE — Telephone Encounter (Signed)
   [facility] Pre-operative Risk Assessment    Request for surgical clearance:  1. What type of surgery is being performed? LEFT TOTAL HIP REPLACEMENT-ANTERIOR   2. When is this surgery scheduled? 05/06/19   3. What type of clearance is required (medical clearance vs. Pharmacy clearance to hold med vs. Both)? BOTH  4. Are there any medications that need to be held prior to surgery and how long? ELIQUIS   5. Practice name and name of physician performing surgery? [facility]; DR. Ellard Artis SMITH   6. What is your office phone number [phone]    7.   What is your office fax number [phone]  8.   Anesthesia type (None, local, MAC, general) ? NOT LISTED, GENERAL, SPINAL ?   Julaine Hua 04/30/2019, 2:36 PM  _________________________________________________________________   (provider comments below)

## 2019-04-30 NOTE — Telephone Encounter (Signed)
   Primary Cardiologist: Lauree Chandler, MD  Chart reviewed as part of pre-operative protocol coverage. Given past medical history and time since last visit, based on ACC/AHA guidelines, NAETOCHUKWU MASTANDREA would be at acceptable risk for the planned procedure without further cardiovascular testing.   Per Pharmacy:  CHADS2-VASc score of  4 (HTN, AGE, CAD, AGE)  CrCl 61 ml/min  Per office protocol, patient can hold Eliquis for 3 days prior to procedure.    Spoke with the patient by phone. He is without cardiac symptoms. I have instructed him to hold Eliquis on 05/03/2019 (3 days prior to surgery), and begin again ASAP thereafter.  He will call to make follow up appt after surgery to be seen by Dr. Angelena Form.   I will route this recommendation to the requesting party via Epic fax function and remove from pre-op pool.  Please call with questions.  Phill Myron. West Pugh, ANP, AACC  04/30/2019, 4:39 PM

## 2019-04-30 NOTE — Telephone Encounter (Signed)
Routed to pharmacy for recommendations on Eliquis.

## 2019-04-30 NOTE — Telephone Encounter (Signed)
Patient with diagnosis of afib on Eliquis for anticoagulation.    Procedure: LEFT TOTAL HIP REPLACEMENT-ANTERIOR  Date of procedure: 05/06/19  CHADS2-VASc score of  4 (HTN, AGE, CAD, AGE)  CrCl 61 ml/min  Per office protocol, patient can hold Eliquis for 3 days prior to procedure.

## 2019-06-18 ENCOUNTER — Other Ambulatory Visit: Payer: Self-pay

## 2019-06-18 ENCOUNTER — Telehealth: Payer: Self-pay

## 2019-06-18 ENCOUNTER — Encounter: Payer: Self-pay | Admitting: Cardiovascular Disease

## 2019-06-18 ENCOUNTER — Ambulatory Visit: Payer: Medicare HMO | Admitting: Cardiovascular Disease

## 2019-06-18 VITALS — BP 122/64 | HR 60 | Ht 66.0 in | Wt 181.0 lb

## 2019-06-18 DIAGNOSIS — I25119 Atherosclerotic heart disease of native coronary artery with unspecified angina pectoris: Secondary | ICD-10-CM | POA: Diagnosis not present

## 2019-06-18 DIAGNOSIS — I10 Essential (primary) hypertension: Secondary | ICD-10-CM

## 2019-06-18 DIAGNOSIS — I480 Paroxysmal atrial fibrillation: Secondary | ICD-10-CM

## 2019-06-18 DIAGNOSIS — E7800 Pure hypercholesterolemia, unspecified: Secondary | ICD-10-CM

## 2019-06-18 DIAGNOSIS — I495 Sick sinus syndrome: Secondary | ICD-10-CM | POA: Diagnosis not present

## 2019-06-18 NOTE — Patient Instructions (Signed)

## 2019-06-18 NOTE — Telephone Encounter (Signed)
Called patient in regards to missed scheduled remote on 04/09/19. Home remote monitor is not connecting to device per Merlin. Unable to leave VM on answering machine.

## 2019-06-18 NOTE — Progress Notes (Signed)
Chief Complaint  Patient presents with  . Follow-up    CAD   History of Present Illness: 79 yo male with history of CAD s/p 5V CABG 2005, paroxysmal atrial fibrillation/flutter, HTN, HLD, sinus node dysfunction s/p pacemaker implantation who is here today for cardiac follow up. His atrial fibrillation/flutter and pacemaker has been followed by Dr. Caryl Comes. He saw Dr. Caryl Comes in April 2013 and was started on dofetilide for atrial fibrillation. He had post-termination pauses and had a pacemaker implanted in April 2013. He has since had Tikosyn stopped due to cost and was started on sotalol. He is on Eliquis. He was seen in our office May 2016 by Richardson Dopp, PA-C with c/o chest pain. Stress myoview was ordered but was cancelled by patient due to cost. I saw him in January 2017 and he c/o daily sharp chest pains. Imdur was added. I saw him in August 2017 and he continued to have chest pain several days per week. Cardiac cath 09/22/15 with 3 patent grafts but could not visualize the other two grafts. The RCA was occluded proximally with no graft to this vessel. We discussed a cardiac CTA but he refused due to claustrophobia. Ranexa added to his regimen in December 2017. Admitted to [facility] in January 2020 with chest pain. Negative troponin. Echo January 2020 with LVEF=65-70%. Thickened aortic valve leaflets but no aortic stenosis. His chest pain was not felt to be cardiac related.   He is here today for follow up. The patient denies any chest pain, dyspnea, palpitations, lower extremity edema, orthopnea, PND, dizziness, near syncope or syncope.   Primary Care Physician: Verdell Carmine., MD   Past Medical History:  Diagnosis Date  . Anemia   . Angina   . Arthritis    "in my back"  . Atrial fibrillation/flutter   . BPH (benign prostatic hyperplasia)   . CAD (coronary artery disease) Dec 2005   Hx MI. 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 Cataract     bilateral-removed 6-7 years ago  . Central obesity   . Chronic back pain   . Dysrhythmia   . Erectile dysfunction   . GERD (gastroesophageal reflux disease)    pt. denies  . Gout    "once; I have it under control w/medicine"  . History of anemia   . History of colon polyps   . Hyperlipidemia   . Hypertension   . IBS (irritable bowel syndrome)    with diarrhea  . Myocardial infarction ([place])    pt. denies at preop  . Pacemaker   . Palpitations   . Sinus node dysfunction (HCC)    post termination pauses <5sec    Past Surgical History:  Procedure Laterality Date  . CARDIAC CATHETERIZATION  2005   "that what sent me to CABG"  . CARDIAC CATHETERIZATION N/A 09/22/2015   Procedure: Left Heart Cath and Cors/Grafts Angiography;  Surgeon: Burnell Blanks, MD;  Location: [place] [facility];  Service: Cardiovascular;  Laterality: N/A;  . CATARACT EXTRACTION W/ INTRAOCULAR LENS  IMPLANT, BILATERAL  ~ 01/2011  . CHOLECYSTECTOMY  1978   . COLONOSCOPY  2014   (Polypectomy) colonic polyps status post polypectomy. Pancolonic diverticulosis predominantly in the sigmoid colon. Small internal hemorrhoids. Patient also had perirectal excoriation  . CORONARY ARTERY BYPASS GRAFT  2005   in [place] - LIMA to LAD, SVG to diagonal, SVG to ramus, SVG to OM and SVG  to RCA   . EYE SURGERY     retina surgey right eye in [place]  . PACEMAKER INSERTION  2014  . PERMANENT PACEMAKER INSERTION N/A 05/25/2011   Procedure: PERMANENT PACEMAKER INSERTION;  Surgeon: [name], MD;  Location: [facility] CATH LAB;  Service: Cardiovascular;  Laterality: N/A;  . TOTAL KNEE ARTHROPLASTY Right 12/16/2018   Procedure: TOTAL KNEE ARTHROPLASTY;  Surgeon: Vickey Huger, MD;  Location: [facility];  Service: Orthopedics;  Laterality: Right;  75 mins needed for length of case  . TRANSURETHRAL RESECTION OF PROSTATE N/A 12/29/2013   Procedure: TRANSURETHRAL RESECTION OF THE PROSTATE WITH GYRUS INSTRUMENTS;  Surgeon: [name], MD;  Location: [facility];  Service: Urology;  Laterality: N/A;    Current Outpatient Medications  Medication Sig Dispense Refill  . allopurinol (ZYLOPRIM) 300 MG tablet Take 300 mg by mouth daily.     Marland Kitchen atorvastatin (LIPITOR) 40 MG tablet Take 40 mg by mouth at bedtime.     . carvedilol (COREG) 3.125 MG tablet TAKE 1 TABLET  BY MOUTH 2 (TWO) TIMES DAILY WITH A MEAL. (Patient taking differently: Take 3.125 mg by mouth 2 (two) times daily with a meal. ) 180 tablet 3  . ELIQUIS 5 MG TABS tablet TAKE 1 TABLET TWICE DAILY 180 tablet 2  . isosorbide mononitrate (IMDUR) 30 MG 24 hr tablet TAKE 1 TABLET TWICE DAILY 180 tablet 2  . methocarbamol (ROBAXIN) 500 MG tablet Take 1-2 tablets (500-1,000 mg total) by mouth every 6 (six) hours as needed for muscle spasms. 60 tablet 0  . nitroGLYCERIN (NITROSTAT) 0.4 MG SL tablet DISSOLVE ONE TABLET UNDER THE TONGUE EVERY 5 MINUTES AS NEEDED FOR CHEST PAIN.  DO NOT EXCEED A TOTAL OF 3 DOSES IN 15 MINUTES 75 tablet 1  . oxyCODONE (OXY IR/ROXICODONE) 5 MG immediate release tablet Take 1 tablet (5 mg total) by mouth every 4 (four) hours as needed for moderate pain (pain score 4-6). 40 tablet 0  . ranolazine (RANEXA) 500 MG 12 hr tablet TAKE 1 TABLET TWICE DAILY 180 tablet 2  . sotalol (BETAPACE) 120 MG tablet TAKE 1 TABLET EVERY 12 HOURS 180 tablet 3  . vitamin B-12 (CYANOCOBALAMIN) 500 MCG tablet Take 500 mcg by mouth daily.     No current facility-administered medications for this visit.    Allergies  Allergen Reactions  . Sulfa Antibiotics Other (See Comments)    SWELLING ON PATIENTS LIPS, HE GETS DRIED UP  . Sulfamethoxazole Other (See Comments)    Unknown  . Sulfamethoxazole-Trimethoprim Other (See Comments)    Unknown    Social History   Socioeconomic History  . Marital status: Married    Spouse name: Not on file  . Number of children: 2  . Years of education: Not on file  . Highest education level: Not on file  Occupational History  .  Occupation: retired-truck Education administrator: RETIRED  Tobacco Use  . Smoking status: Former Smoker    Packs/day: 0.00    Years: 0.00    Pack years: 0.00    Types: Cigarettes    Quit date: 01/30/1961    Years since quitting: 58.4  . Smokeless tobacco: Never Used  Substance and Sexual Activity  . Alcohol use: No    Comment: 2 drinks (bourbon) a night  . Drug use: No  . Sexual activity: Yes  Other Topics Concern  . Not on file  Social History Narrative   Married. Pt works part-time for a [employer].  Quit tobacco 50 years ago..Both parents are deceased, neither one had cardiac issues and no siblings have cardiac issues.    Social Determinants of Health   Financial Resource Strain:   . Difficulty of Paying Living Expenses:   Food Insecurity:   . Worried About Charity fundraiser in the Last Year:   . Arboriculturist in the Last Year:   Transportation Needs:   . Film/video editor (Medical):   Marland Kitchen Lack of Transportation (Non-Medical):   Physical Activity:   . Days of Exercise per Week:   . Minutes of Exercise per Session:   Stress:   . Feeling of Stress :   Social Connections:   . Frequency of Communication with Friends and Family:   . Frequency of Social Gatherings with Friends and Family:   . Attends Religious Services:   . Active Member of Clubs or Organizations:   . Attends Archivist Meetings:   Marland Kitchen Marital Status:   Intimate Partner Violence:   . Fear of Current or Ex-Partner:   . Emotionally Abused:   Marland Kitchen Physically Abused:   . Sexually Abused:     Family History  Problem Relation Age of Onset  . Cancer Mother   . Stroke Brother   . Heart attack Neg Hx   . Colon cancer Neg Hx   . Colon polyps Neg Hx   . Heart disease Neg Hx   . Esophageal cancer Neg Hx   . Rectal cancer Neg Hx   . Stomach cancer Neg Hx     Review of Systems:  As stated in the HPI and otherwise negative.   BP 122/64   Pulse 60   Ht 5\' 6"  (1.676 m)   Wt 181 lb (82.1 kg)    SpO2 99%   BMI 29.21 kg/m   Physical Examination:  General: Well developed, well nourished, NAD  HEENT: OP clear, mucus membranes moist  SKIN: warm, dry. No rashes. Neuro: No focal deficits  Musculoskeletal: Muscle strength 5/5 all ext  Psychiatric: Mood and affect normal  Neck: No JVD, no carotid bruits, no thyromegaly, no lymphadenopathy.  Lungs:Clear bilaterally, no wheezes, rhonci, crackles Cardiovascular: Regular rate and rhythm. No murmurs, gallops or rubs. Abdomen:Soft. Bowel sounds present. Non-tender.  Extremities: No lower extremity edema. Pulses are 2 + in the bilateral DP/PT.  Echo January 2020: - Left ventricle: The cavity size was normal. Wall thickness was   normal. Systolic function was vigorous. The estimated ejection   fraction was in the range of 65% to 70%. Wall motion was normal;   there were no regional wall motion abnormalities. Doppler   parameters are consistent with abnormal left ventricular   relaxation (grade 1 diastolic dysfunction). - Aortic valve: Trileaflet; moderately thickened, moderately   calcified leaflets. - Right ventricle: Pacer wire or catheter noted in right ventricle.  EKG:  EKG is not ordered today. The ekg ordered today demonstrates   Recent Labs: 11/21/2018: Magnesium 1.8 12/11/2018: ALT 18 12/17/2018: BUN 20; Creatinine, Ser 1.02; Hemoglobin 12.6; Platelets 187; Potassium 4.8; Sodium 133   Lipid Panel Followed in primary care   Wt Readings from Last 3 Encounters:  06/18/19 181 lb (82.1 kg)  12/16/18 179 lb (81.2 kg)  12/11/18 181 lb 9 oz (82.4 kg)     Other studies Reviewed: Additional studies/ records that were reviewed today include: . Review of the above records demonstrates:   Assessment and Plan:   1. CAD s/p CABG with angina: Last cardiac  cath in August 2017 but I could not find two of his grafts. He has refused coronary CTA to better define his graft anatomy. Rare chest pains. Continue statin, Imdur, Ranexa and  beta blocker. He has not been on an ASA since he is on Eliquis.   2. Atrial fibrillation, paroxysmal/Sick sinus syndrome: He has no palpitations. Followed by Dr. Caryl Comes. Pacemaker in place for post termination pauses/sinus node dysfunction. Continue Eliquis, beta blocker and sotalol.      3. HTN: BP is well controlled. Continue current therapy  4. HLD: His lipids are followed in primary care.  Recent increase in Lipitor to 80 mg daily. Continue statin. .     Current medicines are reviewed at length with the patient today.  The patient does not have concerns regarding medicines.  The following changes have been made:  no change  Labs/ tests ordered today include:   No orders of the defined types were placed in this encounter.   Disposition:   FU with me in 12 months  Signed, Lauree Chandler, MD 06/18/2019 4:35 PM    [facility] [address], [place], [place]  [address] Phone: [phone]; Fax: [phone]

## 2019-06-24 ENCOUNTER — Ambulatory Visit (INDEPENDENT_AMBULATORY_CARE_PROVIDER_SITE_OTHER): Payer: Medicare HMO | Admitting: *Deleted

## 2019-06-24 DIAGNOSIS — I495 Sick sinus syndrome: Secondary | ICD-10-CM

## 2019-06-24 LAB — CUP PACEART REMOTE DEVICE CHECK
Battery Remaining Longevity: 37 mo
Battery Remaining Percentage: 29 %
Battery Voltage: 2.81 V
Brady Statistic AP VP Percent: 32 %
Brady Statistic AP VS Percent: 66 %
Brady Statistic AS VP Percent: 1 %
Brady Statistic AS VS Percent: 2.2 %
Brady Statistic RA Percent Paced: 94 %
Brady Statistic RV Percent Paced: 34 %
Date Time Interrogation Session: 20210525160055
Implantable Lead Implant Date: 20130425
Implantable Lead Implant Date: 20130425
Implantable Lead Location: 753859
Implantable Lead Location: 753860
Implantable Pulse Generator Implant Date: 20130425
Lead Channel Impedance Value: 390 Ohm
Lead Channel Impedance Value: 410 Ohm
Lead Channel Pacing Threshold Amplitude: 0.625 V
Lead Channel Pacing Threshold Amplitude: 0.875 V
Lead Channel Pacing Threshold Pulse Width: 0.4 ms
Lead Channel Pacing Threshold Pulse Width: 0.4 ms
Lead Channel Sensing Intrinsic Amplitude: 1.8 mV
Lead Channel Sensing Intrinsic Amplitude: 3 mV
Lead Channel Setting Pacing Amplitude: 1.125
Lead Channel Setting Pacing Amplitude: 1.625
Lead Channel Setting Pacing Pulse Width: 0.4 ms
Lead Channel Setting Sensing Sensitivity: 0.7 mV
Pulse Gen Model: 2110
Pulse Gen Serial Number: 7322329

## 2019-06-24 NOTE — Telephone Encounter (Signed)
Spoke with pt.  He reports monitor is plugged in at bedside and has been for a while.  Directed patient to unplug monitor and wait 10-15 seconds before plugging back in in order to reset monitor.  Since monitor uses landline advised pt to follow instructions for manual transmission once we hang up the phone.  Will watch for transmission and call him back in about 10-15 minutes.    Manual transmission received, reviewed.  No concerns.

## 2019-06-25 NOTE — Progress Notes (Signed)
Remote pacemaker transmission.   

## 2019-08-26 ENCOUNTER — Other Ambulatory Visit: Payer: Self-pay

## 2019-08-26 IMAGING — CR DG CHEST 2V
2 series · 2 of 2 positions shown · non-contrast
Comparison: 12/23/2013 and earlier.

CLINICAL DATA: 77-year-old two-day history of chest pain,
generalized weakness and shortness of breath on exertion.

EXAM:
CHEST - 2 VIEW

[chest lat]
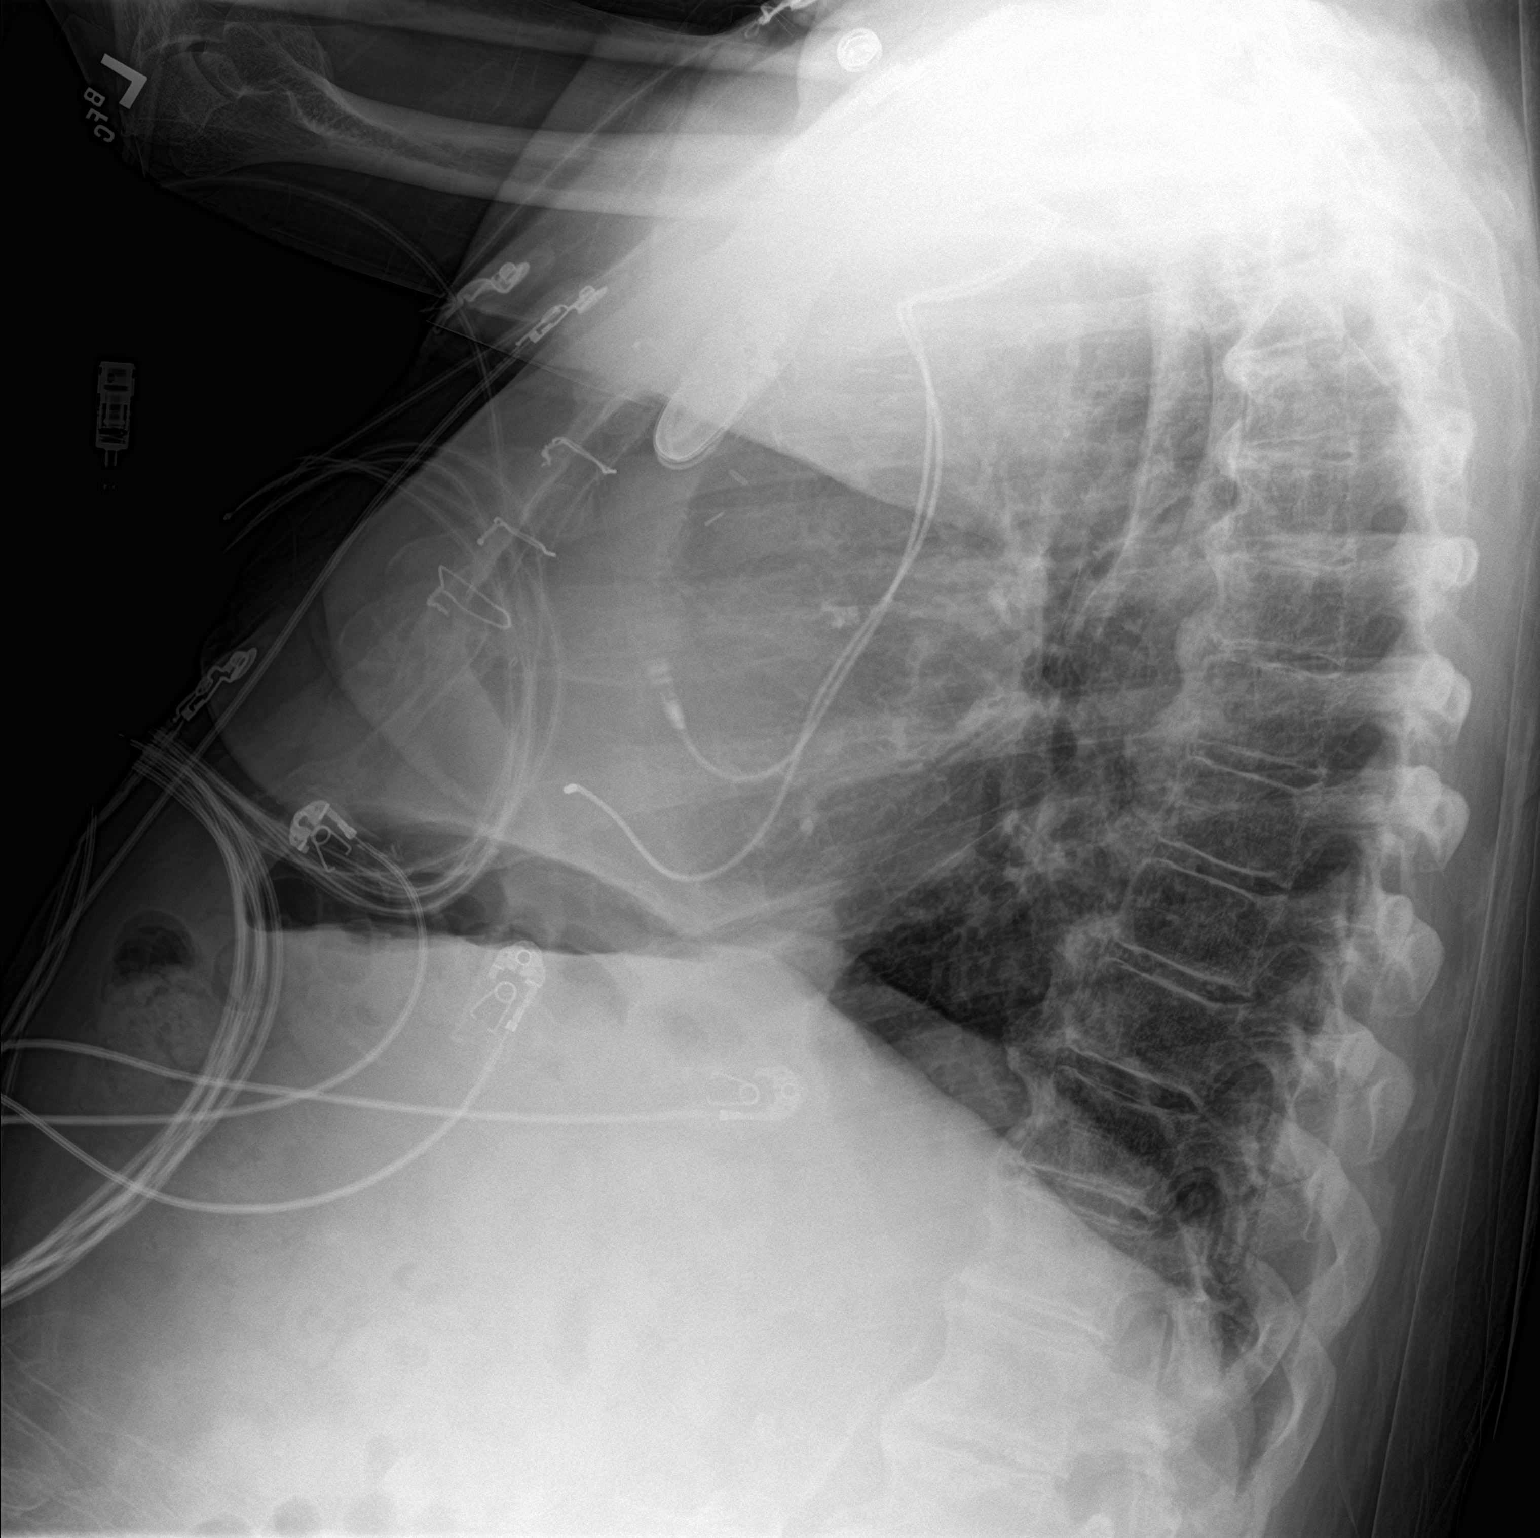

[chest ap]
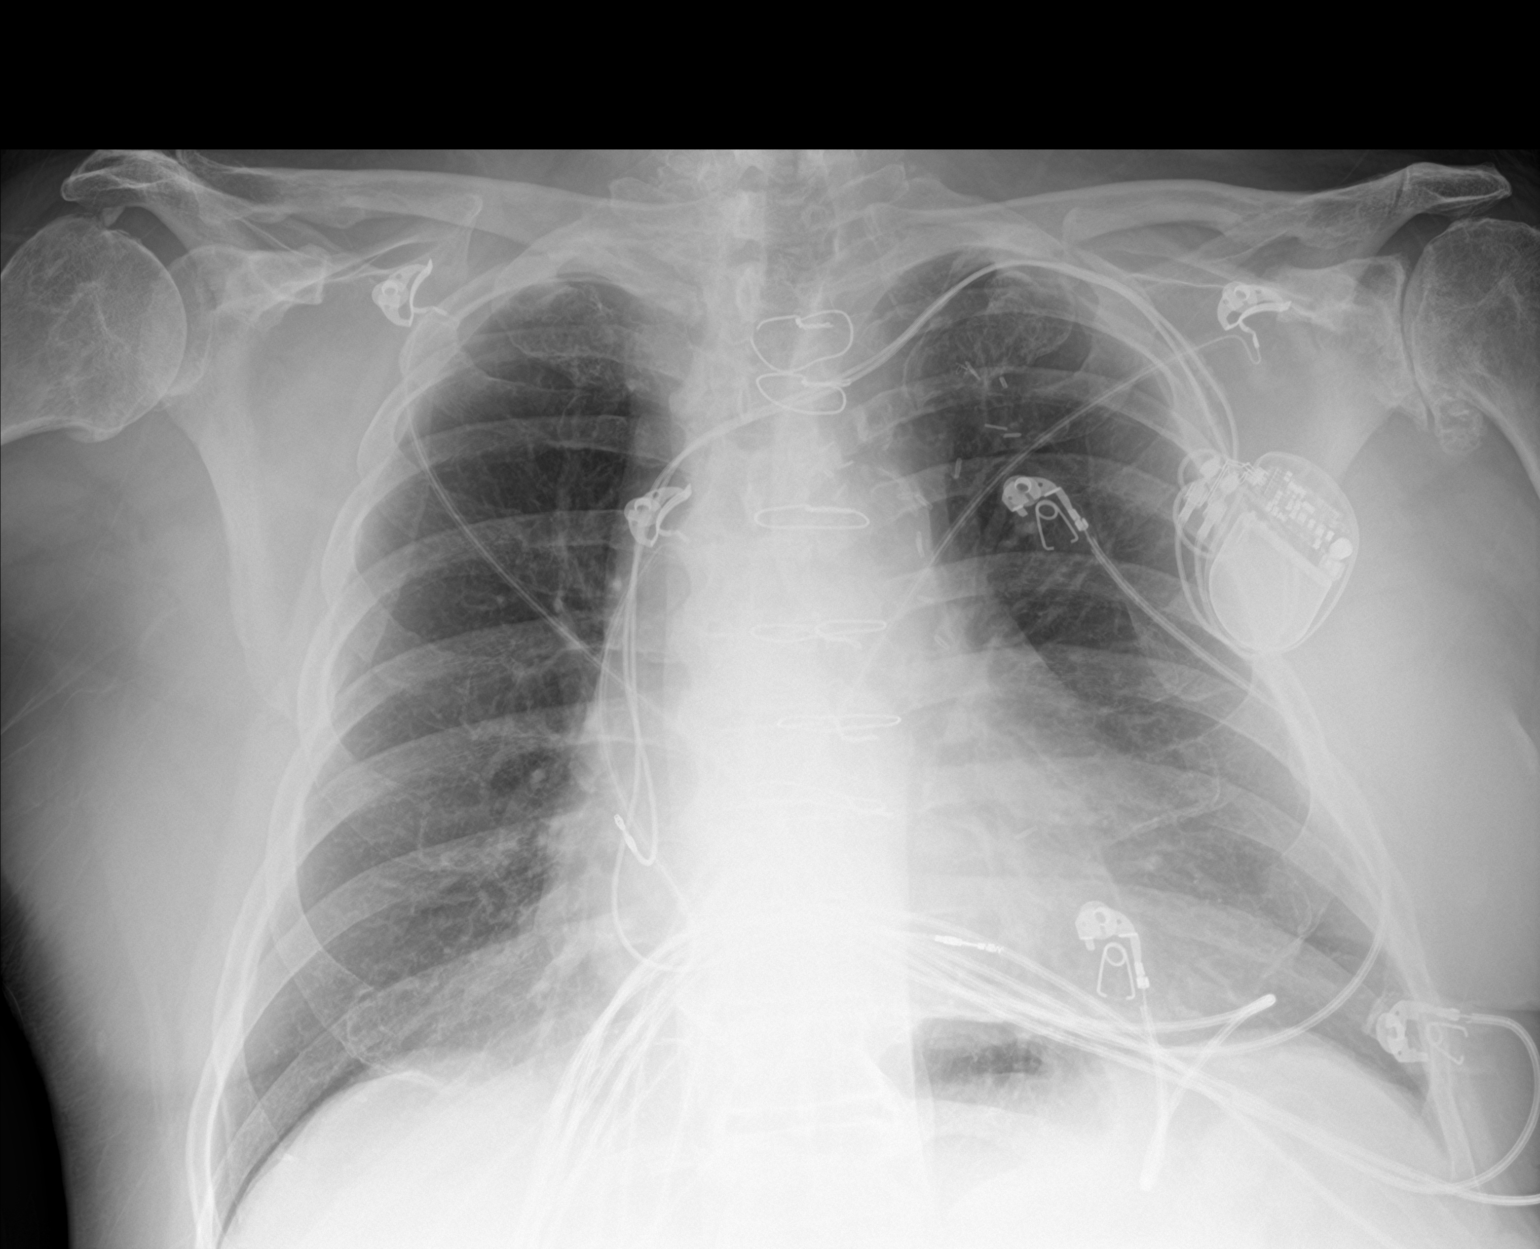

[2 of 2 positions shown; findings below may reference images not displayed]

FINDINGS: Prior sternotomy for CABG. Cardiac silhouette moderately enlarged,
unchanged. LEFT subclavian dual lead transvenous pacemaker lead tips
at the expected location of the RIGHT atrial appendage and RV apex,
unchanged. Lungs clear. Bronchovascular markings normal. Pulmonary
vascularity normal. No visible pleural effusions. No pneumothorax.
Degenerative changes and DISH involving the thoracic spine. Severe
degenerative changes involving the LEFT shoulder.
IMPRESSION: Stable cardiomegaly. No acute cardiopulmonary disease.

## 2019-08-26 MED ORDER — NITROGLYCERIN 0.4 MG SL SUBL: 0.4000 mg | SUBLINGUAL_TABLET | SUBLINGUAL | 1 refills | Status: DC | PRN

## 2019-09-23 ENCOUNTER — Ambulatory Visit (INDEPENDENT_AMBULATORY_CARE_PROVIDER_SITE_OTHER): Payer: Medicare HMO | Admitting: *Deleted

## 2019-09-23 DIAGNOSIS — I495 Sick sinus syndrome: Secondary | ICD-10-CM | POA: Diagnosis not present

## 2019-09-23 LAB — CUP PACEART REMOTE DEVICE CHECK
Battery Remaining Longevity: 32 mo
Battery Remaining Percentage: 25 %
Battery Voltage: 2.8 V
Brady Statistic AP VP Percent: 42 %
Brady Statistic AP VS Percent: 56 %
Brady Statistic AS VP Percent: 1 %
Brady Statistic AS VS Percent: 1.6 %
Brady Statistic RA Percent Paced: 96 %
Brady Statistic RV Percent Paced: 43 %
Date Time Interrogation Session: 20210824120708
Implantable Lead Implant Date: 20130425
Implantable Lead Implant Date: 20130425
Implantable Lead Location: 753859
Implantable Lead Location: 753860
Implantable Pulse Generator Implant Date: 20130425
Lead Channel Impedance Value: 410 Ohm
Lead Channel Impedance Value: 450 Ohm
Lead Channel Pacing Threshold Amplitude: 0.75 V
Lead Channel Pacing Threshold Amplitude: 1 V
Lead Channel Pacing Threshold Pulse Width: 0.4 ms
Lead Channel Pacing Threshold Pulse Width: 0.4 ms
Lead Channel Sensing Intrinsic Amplitude: 2.1 mV
Lead Channel Sensing Intrinsic Amplitude: 4 mV
Lead Channel Setting Pacing Amplitude: 1.25 V
Lead Channel Setting Pacing Amplitude: 1.75 V
Lead Channel Setting Pacing Pulse Width: 0.4 ms
Lead Channel Setting Sensing Sensitivity: 0.7 mV
Pulse Gen Model: 2110
Pulse Gen Serial Number: 7322329

## 2019-09-29 NOTE — Progress Notes (Signed)
Remote pacemaker transmission.   

## 2019-11-05 ENCOUNTER — Other Ambulatory Visit: Payer: Self-pay | Admitting: Cardiovascular Disease

## 2019-11-26 ENCOUNTER — Other Ambulatory Visit: Payer: Self-pay | Admitting: Cardiovascular Disease

## 2019-11-27 NOTE — Telephone Encounter (Signed)
Eliquis 5mg  refill request received. Patient is 79 years old, weight-82.1kg, Crea-1.02 on 12/17/2018, Diagnosis-Afib, and last seen by Dr. Angelena Form on 06/18/2019. Dose is appropriate based on dosing criteria. Will send in refill to requested pharmacy.

## 2019-12-23 ENCOUNTER — Ambulatory Visit (INDEPENDENT_AMBULATORY_CARE_PROVIDER_SITE_OTHER): Payer: Medicare HMO

## 2019-12-23 DIAGNOSIS — I495 Sick sinus syndrome: Secondary | ICD-10-CM | POA: Diagnosis not present

## 2019-12-24 LAB — CUP PACEART REMOTE DEVICE CHECK
Battery Remaining Longevity: 24 mo
Battery Remaining Percentage: 19 %
Battery Voltage: 2.77 V
Brady Statistic AP VP Percent: 48 %
Brady Statistic AP VS Percent: 50 %
Brady Statistic AS VP Percent: 1 %
Brady Statistic AS VS Percent: 1.2 %
Brady Statistic RA Percent Paced: 96 %
Brady Statistic RV Percent Paced: 50 %
Date Time Interrogation Session: 20211123163248
Implantable Lead Implant Date: 20130425
Implantable Lead Implant Date: 20130425
Implantable Lead Location: 753859
Implantable Lead Location: 753860
Implantable Pulse Generator Implant Date: 20130425
Lead Channel Impedance Value: 390 Ohm
Lead Channel Impedance Value: 430 Ohm
Lead Channel Pacing Threshold Amplitude: 0.625 V
Lead Channel Pacing Threshold Amplitude: 1 V
Lead Channel Pacing Threshold Pulse Width: 0.4 ms
Lead Channel Pacing Threshold Pulse Width: 0.4 ms
Lead Channel Sensing Intrinsic Amplitude: 1.6 mV
Lead Channel Sensing Intrinsic Amplitude: 3.7 mV
Lead Channel Setting Pacing Amplitude: 1.25 V
Lead Channel Setting Pacing Amplitude: 1.625
Lead Channel Setting Pacing Pulse Width: 0.4 ms
Lead Channel Setting Sensing Sensitivity: 0.7 mV
Pulse Gen Model: 2110
Pulse Gen Serial Number: 7322329

## 2019-12-30 NOTE — Progress Notes (Signed)
Remote pacemaker transmission.   

## 2020-01-02 ENCOUNTER — Other Ambulatory Visit: Payer: Self-pay | Admitting: Cardiovascular Disease

## 2020-01-09 ENCOUNTER — Other Ambulatory Visit: Payer: Self-pay | Admitting: Cardiovascular Disease

## 2020-03-02 ENCOUNTER — Encounter: Payer: Self-pay | Admitting: Internal Medicine

## 2020-03-02 ENCOUNTER — Ambulatory Visit: Payer: Medicare HMO | Admitting: Internal Medicine

## 2020-03-02 ENCOUNTER — Other Ambulatory Visit: Payer: Self-pay

## 2020-03-02 VITALS — BP 118/68 | HR 64 | Ht 66.0 in | Wt 190.0 lb

## 2020-03-02 DIAGNOSIS — Z79899 Other long term (current) drug therapy: Secondary | ICD-10-CM

## 2020-03-02 DIAGNOSIS — I480 Paroxysmal atrial fibrillation: Secondary | ICD-10-CM | POA: Diagnosis not present

## 2020-03-02 DIAGNOSIS — I495 Sick sinus syndrome: Secondary | ICD-10-CM | POA: Diagnosis not present

## 2020-03-02 DIAGNOSIS — Z950 Presence of cardiac pacemaker: Secondary | ICD-10-CM

## 2020-03-02 NOTE — Patient Instructions (Signed)
Medication Instructions:  Your physician recommends that you continue on your current medications as directed. Please refer to the Current Medication list given to you today.  *If you need a refill on your cardiac medications before your next appointment, please call your pharmacy*   Lab Work: Mg today  If you have labs (blood work) drawn today and your tests are completely normal, you will receive your results only by: Marland Kitchen MyChart Message (if you have MyChart) OR . A paper copy in the mail If you have any lab test that is abnormal or we need to change your treatment, we will call you to review the results.   Testing/Procedures: None ordered.    Follow-Up: At [facility], you and your health needs are our priority.  As part of our continuing mission to provide you with exceptional heart care, we have created designated Provider Care Teams.  These Care Teams include your primary Cardiologist (physician) and Advanced Practice Providers (APPs -  Physician Assistants and Nurse Practitioners) who all work together to provide you with the care you need, when you need it.  We recommend signing up for the patient portal called "MyChart".  Sign up information is provided on this After Visit Summary.  MyChart is used to connect with patients for Virtual Visits (Telemedicine).  Patients are able to view lab/test results, encounter notes, upcoming appointments, etc.  Non-urgent messages can be sent to your provider as well.   To learn more about what you can do with MyChart, go to [email or web address].    Your next appointment:   6 month(s)  The format for your next appointment:   In Person  Provider:   You will see one of the following Advanced Practice Providers on your designated Care Team:    Chanetta Marshall, NP  Tommye Standard, PA-C  Legrand Como "[place]" [place], [place]

## 2020-03-02 NOTE — Progress Notes (Signed)
Patient Care Team: Spry, Marsh Dolly., MD as PCP - General (Family Medicine) Burnell Blanks, MD as PCP - Cardiology (Cardiology) [name], MD as PCP - Electrophysiology (Cardiology)   HPI  Dylan Roth is a 80 y.o. male Seen in followup for atrial fibrillation with  posttermination pauses for which he underwent pacemaker implantation 4/13. He has been treated with sotalol and Rivaroxaban; he ended up back on warfarin because of bleeding issues  He has a history of ischemic heart disease with prior bypass surgery 2005.    DATE TEST EF   9/14 Echo  55-65   8/17 LHC   % 3/5 grafts patent   1/20 Echo  65-70%    At his last visit 10/21, dyspnea with exertion associated with an exuberant rate response.  Slope was downgraded with improvement in symptoms and this has persisted   No sob  Does complain of exertional chest pain, similar to what he had when last cathed without progression.    Was working in a tree and fell out of the tree and was trashed his hip.  This has improved considerably following surgery     Date Cr K LDL Mg Hgb  8/17  0.94 4.4  1.9 14.2   12/17       15.2  2/19 1.04 4.7  2.0   1/20 1.12 4.5   13.0  10/21 0.99 4.7 45  13.1    Date Rwave  7/18 5.0-6.3  10/20 3.1-5.0  1/22 3.9-4.5      Past Medical History:  Diagnosis Date  . Anemia   . Angina   . Arthritis    "in my back"  . Atrial fibrillation/flutter   . BPH (benign prostatic hyperplasia)   . CAD (coronary artery disease) Dec 2005   Hx MI. 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 Cataract    bilateral-removed 6-7 years ago  . Central obesity   . Chronic back pain   . Dysrhythmia   . Erectile dysfunction   . GERD (gastroesophageal reflux disease)    pt. denies  . Gout    "once; I have it under control w/medicine"  . History of anemia   . History of colon polyps   . Hyperlipidemia   . Hypertension   . IBS (irritable  bowel syndrome)    with diarrhea  . Myocardial infarction ([place])    pt. denies at preop  . Pacemaker   . Palpitations   . Sinus node dysfunction (HCC)    post termination pauses <5sec    Past Surgical History:  Procedure Laterality Date  . CARDIAC CATHETERIZATION  2005   "that what sent me to CABG"  . CARDIAC CATHETERIZATION N/A 09/22/2015   Procedure: Left Heart Cath and Cors/Grafts Angiography;  Surgeon: Burnell Blanks, MD;  Location: [place] CV LAB;  Service: Cardiovascular;  Laterality: N/A;  . CATARACT EXTRACTION W/ INTRAOCULAR LENS  IMPLANT, BILATERAL  ~ 01/2011  . CHOLECYSTECTOMY  1978   . COLONOSCOPY  2014   (Polypectomy) colonic polyps status post polypectomy. Pancolonic diverticulosis predominantly in the sigmoid colon. Small internal hemorrhoids. Patient also had perirectal excoriation  . CORONARY ARTERY BYPASS GRAFT  2005   in [place] - LIMA to LAD, SVG to diagonal, SVG to ramus, SVG to OM and SVG to RCA   . EYE SURGERY     retina surgey right eye in [place]  . PACEMAKER  INSERTION  2014  . PERMANENT PACEMAKER INSERTION N/A 05/25/2011   Procedure: PERMANENT PACEMAKER INSERTION;  Surgeon: [name], MD;  Location: [facility] CATH LAB;  Service: Cardiovascular;  Laterality: N/A;  . TOTAL KNEE ARTHROPLASTY Right 12/16/2018   Procedure: TOTAL KNEE ARTHROPLASTY;  Surgeon: Vickey Huger, MD;  Location: [facility];  Service: Orthopedics;  Laterality: Right;  75 mins needed for length of case  . TRANSURETHRAL RESECTION OF PROSTATE N/A 12/29/2013   Procedure: TRANSURETHRAL RESECTION OF THE PROSTATE WITH GYRUS INSTRUMENTS;  Surgeon: [name], MD;  Location: [facility];  Service: Urology;  Laterality: N/A;    Current Outpatient Medications  Medication Sig Dispense Refill  . allopurinol (ZYLOPRIM) 300 MG tablet Take 300 mg by mouth daily.    Marland Kitchen atorvastatin (LIPITOR) 40 MG tablet Take 40 mg by mouth at bedtime.     . carvedilol (COREG) 3.125 MG tablet TAKE 1 TABLET TWICE  DAILY WITH A MEAL 90 tablet 0  . ELIQUIS 5 MG TABS tablet TAKE 1 TABLET TWICE DAILY 180 tablet 1  . isosorbide mononitrate (IMDUR) 30 MG 24 hr tablet Take 1 tablet (30 mg total) by mouth 2 (two) times daily. 180 tablet 1  . methocarbamol (ROBAXIN) 500 MG tablet Take 1-2 tablets (500-1,000 mg total) by mouth every 6 (six) hours as needed for muscle spasms. 60 tablet 0  . nitroGLYCERIN (NITROSTAT) 0.4 MG SL tablet Place 1 tablet (0.4 mg total) under the tongue every 5 (five) minutes as needed for chest pain. DO NOT EXCEED A TOTAL OF 3 DOSES IN 15 MINUTES 75 tablet 1  . ranolazine (RANEXA) 500 MG 12 hr tablet TAKE 1 TABLET TWICE DAILY 180 tablet 1  . sotalol (BETAPACE) 120 MG tablet TAKE 1 TABLET EVERY 12 HOURS 180 tablet 1  . vitamin B-12 (CYANOCOBALAMIN) 500 MCG tablet Take 500 mcg by mouth daily.    Marland Kitchen escitalopram (LEXAPRO) 10 MG tablet daily in the afternoon.     No current facility-administered medications for this visit.    Allergies  Allergen Reactions  . Sulfa Antibiotics Other (See Comments)    SWELLING ON PATIENTS LIPS, HE GETS DRIED UP  . Sulfamethoxazole Other (See Comments)    Unknown  . Sulfamethoxazole-Trimethoprim Other (See Comments)    Unknown    Review of Systems negative except from HPI and PMH  Physical Exam BP 118/68   Pulse 64   Ht 5\' 6"  (1.676 m)   Wt 190 lb (86.2 kg)   SpO2 97%   BMI 30.67 kg/m  Well developed and well nourished in no acute distress HENT normal Neck supple with JVP-flat Clear Device pocket well healed; without hematoma or erythema.  There is no tethering  Regular rate and rhythm, no * murmur Abd-soft with active BS No Clubbing cyanosis   edema Skin-warm and dry A & Oriented  Grossly normal sensory and motor function  ECG *AVpacing at 64  25/15/48     Assessment and  Plan  Ischemic heart disease with prior bypass surgery--New ECG changes  Exertional dyspnea-   Sinus node dysfunction and chronotropic  incompetence  Pacemaker-St. Jude The patient's device was interrogated and the information was fully reviewed.  The device was reprogrammed (As Below)   High Risk Medication Surveillance-sotalol  AF paroxsymal  AV conduction 1 AVBlock   Infrequent atrial fibrillation  On Anticoagulation;  No bleeding issues   Excess VPacing about 50%   Mostly at the lower rate limit.  VIP is already active so we increased  the AVdelay 200->>250 msec and decreased LRL 60>>55--this largely derives from his 1 AVB Concern re exertional chest discomfort, but has been stable and DR CMac aware--I will alert him as they have followup scheduled later this month

## 2020-03-03 LAB — CUP PACEART INCLINIC DEVICE CHECK
Battery Remaining Longevity: 18 mo
Battery Voltage: 2.74 V
Brady Statistic RA Percent Paced: 96 %
Brady Statistic RV Percent Paced: 54 %
Date Time Interrogation Session: 20220201145100
Implantable Lead Implant Date: 20130425
Implantable Lead Implant Date: 20130425
Implantable Lead Location: 753859
Implantable Lead Location: 753860
Implantable Pulse Generator Implant Date: 20130425
Lead Channel Impedance Value: 387.5 Ohm
Lead Channel Impedance Value: 450 Ohm
Lead Channel Pacing Threshold Amplitude: 0.625 V
Lead Channel Pacing Threshold Amplitude: 1 V
Lead Channel Pacing Threshold Pulse Width: 0.4 ms
Lead Channel Pacing Threshold Pulse Width: 0.4 ms
Lead Channel Sensing Intrinsic Amplitude: 2.5 mV
Lead Channel Sensing Intrinsic Amplitude: 3.9 mV
Lead Channel Setting Pacing Amplitude: 1.25 V
Lead Channel Setting Pacing Amplitude: 1.625
Lead Channel Setting Pacing Pulse Width: 0.4 ms
Lead Channel Setting Sensing Sensitivity: 0.7 mV
Pulse Gen Model: 2110
Pulse Gen Serial Number: 7322329

## 2020-03-03 LAB — MAGNESIUM: Magnesium: 2.1 mg/dL (ref 1.6–2.3)

## 2020-03-10 ENCOUNTER — Telehealth: Payer: Self-pay

## 2020-03-10 NOTE — Telephone Encounter (Signed)
Spoke with pt's wife, DPR, advised per Dr Caryl Comes pt's labs are normal.  Pt's wife verbalized understanding and thanked RN for the call.

## 2020-03-10 NOTE — Telephone Encounter (Signed)
-----   Message from [name], MD sent at 03/09/2020  1:40 PM EST -----  Please inform patient that drug surveillance labs are normal

## 2020-03-23 ENCOUNTER — Ambulatory Visit (INDEPENDENT_AMBULATORY_CARE_PROVIDER_SITE_OTHER): Payer: Medicare HMO

## 2020-03-23 DIAGNOSIS — I495 Sick sinus syndrome: Secondary | ICD-10-CM | POA: Diagnosis not present

## 2020-03-24 LAB — CUP PACEART REMOTE DEVICE CHECK
Battery Remaining Longevity: 19 mo
Battery Remaining Percentage: 15 %
Battery Voltage: 2.74 V
Brady Statistic AP VP Percent: 49 %
Brady Statistic AP VS Percent: 49 %
Brady Statistic AS VP Percent: 1 %
Brady Statistic AS VS Percent: 1.4 %
Brady Statistic RA Percent Paced: 98 %
Brady Statistic RV Percent Paced: 50 %
Date Time Interrogation Session: 20220222173030
Implantable Lead Implant Date: 20130425
Implantable Lead Implant Date: 20130425
Implantable Lead Location: 753859
Implantable Lead Location: 753860
Implantable Pulse Generator Implant Date: 20130425
Lead Channel Impedance Value: 390 Ohm
Lead Channel Impedance Value: 410 Ohm
Lead Channel Pacing Threshold Amplitude: 0.625 V
Lead Channel Pacing Threshold Amplitude: 1 V
Lead Channel Pacing Threshold Pulse Width: 0.4 ms
Lead Channel Pacing Threshold Pulse Width: 0.4 ms
Lead Channel Sensing Intrinsic Amplitude: 1.8 mV
Lead Channel Sensing Intrinsic Amplitude: 3.6 mV
Lead Channel Setting Pacing Amplitude: 1.25 V
Lead Channel Setting Pacing Amplitude: 1.625
Lead Channel Setting Pacing Pulse Width: 0.4 ms
Lead Channel Setting Sensing Sensitivity: 0.7 mV
Pulse Gen Model: 2110
Pulse Gen Serial Number: 7322329

## 2020-03-26 ENCOUNTER — Ambulatory Visit: Payer: Medicare HMO | Admitting: Cardiovascular Disease

## 2020-03-26 ENCOUNTER — Other Ambulatory Visit: Payer: Self-pay

## 2020-03-26 ENCOUNTER — Encounter: Payer: Self-pay | Admitting: Cardiovascular Disease

## 2020-03-26 VITALS — BP 118/70 | HR 80 | Ht 66.0 in | Wt 189.0 lb

## 2020-03-26 DIAGNOSIS — I480 Paroxysmal atrial fibrillation: Secondary | ICD-10-CM

## 2020-03-26 DIAGNOSIS — E7800 Pure hypercholesterolemia, unspecified: Secondary | ICD-10-CM

## 2020-03-26 DIAGNOSIS — I495 Sick sinus syndrome: Secondary | ICD-10-CM

## 2020-03-26 DIAGNOSIS — I25119 Atherosclerotic heart disease of native coronary artery with unspecified angina pectoris: Secondary | ICD-10-CM

## 2020-03-26 DIAGNOSIS — I10 Essential (primary) hypertension: Secondary | ICD-10-CM

## 2020-03-26 NOTE — Patient Instructions (Signed)

## 2020-03-26 NOTE — Progress Notes (Signed)
Chief Complaint  Patient presents with  . Follow-up    CAD   History of Present Illness: 80 yo Roth with history of CAD s/p 5V CABG 2005, paroxysmal atrial fibrillation/flutter, HTN, HLD, sinus node dysfunction s/p pacemaker implantation who is here today for cardiac follow up. His atrial fibrillation/flutter and pacemaker has been followed by Dr. Caryl Comes. He saw Dr. Caryl Comes in April 2013 and was started on dofetilide for atrial fibrillation. He had post-termination pauses and had a pacemaker implanted in April 2013. He has since had Tikosyn stopped due to cost and was started on sotalol. He is on Eliquis. He was seen in our office May 2016 by Richardson Dopp, PA-C with c/o chest pain. Stress myoview was ordered but was cancelled by patient due to cost. I saw him in January 2017 and he c/o daily sharp chest pains. Imdur was added. I saw him in August 2017 and he continued to have chest pain several days per week. Cardiac cath 09/22/15 with 3 patent grafts but could not visualize the other two grafts. The RCA was occluded proximally with no graft to this vessel. We discussed a cardiac CTA but he refused due to claustrophobia. Ranexa added to his regimen in December 2017. Admitted to [facility] in January 2020 with chest pain. Negative troponin. Echo January 2020 with LVEF=65-70%. Thickened aortic valve leaflets but no aortic stenosis. His chest pain was not felt to be cardiac related.   He is here today for follow up. The patient denies any chest pain, dyspnea, palpitations, lower extremity edema, orthopnea, PND, dizziness, near syncope or syncope.   Primary Care Physician: Verdell Carmine., MD   Past Medical History:  Diagnosis Date  . Anemia   . Angina   . Arthritis    "in my back"  . Atrial fibrillation/flutter   . BPH (benign prostatic hyperplasia)   . CAD (coronary artery disease) Dec 2005   Hx MI. 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 Cataract     bilateral-removed 6-7 years ago  . Central obesity   . Chronic back pain   . Dysrhythmia   . Erectile dysfunction   . GERD (gastroesophageal reflux disease)    pt. denies  . Gout    "once; I have it under control w/medicine"  . History of anemia   . History of colon polyps   . Hyperlipidemia   . Hypertension   . IBS (irritable bowel syndrome)    with diarrhea  . Myocardial infarction ([place])    pt. denies at preop  . Pacemaker   . Palpitations   . Sinus node dysfunction (H[facility])    post termination pauses <5sec    Past Surgical History:  Procedure Laterality Date  . CARDIAC CATHETERIZATION  2005   "that what sent me to CABG"  . CARDIAC CATHETERIZATION N/A 09/22/2015   Procedure: Left Heart Cath and Cors/Grafts Angiography;  Surgeon: Burnell Blanks, MD;  Location: [place] [facility];  Service: Cardiovascular;  Laterality: N/A;  . CATARACT EXTRACTION W/ INTRAOCULAR LENS  IMPLANT, BILATERAL  ~ 01/2011  . CHOLECYSTECTOMY  1978   . COLONOSCOPY  2014   (Polypectomy) colonic polyps status post polypectomy. Pancolonic diverticulosis predominantly in the sigmoid colon. Small internal hemorrhoids. Patient also had perirectal excoriation  . CORONARY ARTERY BYPASS GRAFT  2005   in [place] - LIMA to LAD, SVG to diagonal, SVG to ramus, SVG to OM and SVG  to RCA   . EYE SURGERY     retina surgey right eye in [place]  . PACEMAKER INSERTION  2014  . PERMANENT PACEMAKER INSERTION N/A 05/25/2011   Procedure: PERMANENT PACEMAKER INSERTION;  Surgeon: [name], MD;  [facility];  Service: Cardiovascular;  Laterality: N/A;  . TOTAL KNEE ARTHROPLASTY Right 12/16/2018   Procedure: TOTAL KNEE ARTHROPLASTY;  Surgeon: Vickey Huger, MD;  Location: [facility];  Service: Orthopedics;  Laterality: Right;  75 mins needed for length of case  . TRANSURETHRAL RESECTION OF PROSTATE N/A 12/29/2013   Procedure: TRANSURETHRAL RESECTION OF THE PROSTATE WITH GYRUS INSTRUMENTS;  Surgeon: [name], MD;  Location: [facility];  Service: Urology;  Laterality: N/A;    Current Outpatient Medications  Medication Sig Dispense Refill  . allopurinol (ZYLOPRIM) 300 MG tablet Take 300 mg by mouth daily.    Marland Kitchen atorvastatin (LIPITOR) 40 MG tablet Take 40 mg by mouth at bedtime.     . carvedilol (COREG) 3.125 MG tablet TAKE 1 TABLET TWICE DAILY WITH A MEAL 90 tablet 0  . ELIQUIS 5 MG TABS tablet TAKE 1 TABLET TWICE DAILY 180 tablet 1  . escitalopram (LEXAPRO) 10 MG tablet daily in the afternoon.    . isosorbide mononitrate (IMDUR) 30 MG 24 hr tablet Take 1 tablet (30 mg total) by mouth 2 (two) times daily. 180 tablet 1  . methocarbamol (ROBAXIN) 500 MG tablet Take 1-2 tablets (500-1,000 mg total) by mouth every 6 (six) hours as needed for muscle spasms. 60 tablet 0  . nitroGLYCERIN (NITROSTAT) 0.4 MG SL tablet Place 1 tablet (0.4 mg total) under the tongue every 5 (five) minutes as needed for chest pain. DO NOT EXCEED A TOTAL OF 3 DOSES IN 15 MINUTES 75 tablet 1  . ranolazine (RANEXA) 500 MG 12 hr tablet TAKE 1 TABLET TWICE DAILY 180 tablet 1  . sotalol (BETAPACE) 120 MG tablet TAKE 1 TABLET EVERY 12 HOURS 180 tablet 1  . vitamin B-12 (CYANOCOBALAMIN) 500 MCG tablet Take 500 mcg by mouth daily.     No current facility-administered medications for this visit.    Allergies  Allergen Reactions  . Sulfa Antibiotics Other (See Comments)    SWELLING ON PATIENTS LIPS, HE GETS DRIED UP  . Sulfamethoxazole Other (See Comments)    Unknown  . Sulfamethoxazole-Trimethoprim Other (See Comments)    Unknown    Social History   Socioeconomic History  . Marital status: Married    Spouse name: Not on file  . Number of children: 2  . Years of education: Not on file  . Highest education level: Not on file  Occupational History  . Occupation: retired-truck Education administrator: RETIRED  Tobacco Use  . Smoking status: Former Smoker    Packs/day: 0.00    Years: 0.00    Pack years: 0.00    Types:  Cigarettes    Quit date: 01/30/1961    Years since quitting: 59.1  . Smokeless tobacco: Never Used  Vaping Use  . Vaping Use: Never used  Substance and Sexual Activity  . Alcohol use: No    Comment: 2 drinks (bourbon) a night  . Drug use: No  . Sexual activity: Yes  Other Topics Concern  . Not on file  Social History Narrative   Married. Pt works part-time for a [employer]. Quit tobacco 50 years ago..Both parents are deceased, neither one had cardiac issues and no siblings have cardiac issues.    Social  Determinants of Health   Financial Resource Strain: Not on file  Food Insecurity: Not on file  Transportation Needs: Not on file  Physical Activity: Not on file  Stress: Not on file  Social Connections: Not on file  Intimate Partner Violence: Not on file    Family History  Problem Relation Age of Onset  . Cancer Mother   . Stroke Brother   . Heart attack Neg Hx   . Colon cancer Neg Hx   . Colon polyps Neg Hx   . Heart disease Neg Hx   . Esophageal cancer Neg Hx   . Rectal cancer Neg Hx   . Stomach cancer Neg Hx     Review of Systems:  As stated in the HPI and otherwise negative.   BP 118/70   Pulse 80   Ht 5\' 6"  (1.676 m)   Wt 189 lb (85.7 kg)   SpO2 97%   BMI 30.51 kg/m   Physical Examination: General: Well developed, well nourished, NAD  HEENT: OP clear, mucus membranes moist  SKIN: warm, dry. No rashes. Neuro: No focal deficits  Musculoskeletal: Muscle strength 5/5 all ext  Psychiatric: Mood and affect normal  Neck: No JVD, no carotid bruits, no thyromegaly, no lymphadenopathy.  Lungs:Clear bilaterally, no wheezes, rhonci, crackles Cardiovascular: Regular rate and rhythm. No murmurs, gallops or rubs. Abdomen:Soft. Bowel sounds present. Non-tender.  Extremities: No lower extremity edema. Pulses are 2 + in the bilateral DP/PT.  Echo January 2020: - Left ventricle: The cavity size was normal. Wall thickness was   normal. Systolic function was vigorous.  The estimated ejection   fraction was in the range of 65% to 70%. Wall motion was normal;   there were no regional wall motion abnormalities. Doppler   parameters are consistent with abnormal left ventricular   relaxation (grade 1 diastolic dysfunction). - Aortic valve: Trileaflet; moderately thickened, moderately   calcified leaflets. - Right ventricle: Pacer wire or catheter noted in right ventricle.  EKG:  EKG is not ordered today. The ekg ordered today demonstrates   Recent Labs: 03/02/2020: Magnesium 2.1   Lipid Panel Followed in primary care   Wt Readings from Last 3 Encounters:  03/26/20 189 lb (85.7 kg)  03/02/20 190 lb (86.2 kg)  06/18/19 181 lb (82.1 kg)     Other studies Reviewed: Additional studies/ records that were reviewed today include: . Review of the above records demonstrates:   Assessment and Plan:   1. CAD s/p CABG with angina: Last cardiac cath in August 2017 but I could not find two of his grafts. He has refused coronary CTA to better define his graft anatomy. He has rare chest pains. Will continue statin, Ranexa, Imdur and beta blocker. He has not been on an ASA since he is on Eliquis.   2. Atrial fibrillation, paroxysmal/Sick sinus syndrome: Sinus on exam today. He has no palpitations. Followed by Dr. Caryl Comes. Pacemaker in place for post termination pauses/sinus node dysfunction. Continue beta blocker, sotalol, Eliquis.   3. HTN: BP is well controlled.   4. HLD: His lipids are followed in primary care.  Continue statin.     Current medicines are reviewed at length with the patient today.  The patient does not have concerns regarding medicines.  The following changes have been made:  no change  Labs/ tests ordered today include:   No orders of the defined types were placed in this encounter.   Disposition:   FU with me in 12 months  Signed,  Lauree Chandler, MD 03/26/2020 3:59 PM    [facility] [address],  [place], [place]  [address] Phone: [phone]; Fax: [phone]

## 2020-03-31 NOTE — Progress Notes (Signed)
Remote pacemaker transmission.   

## 2020-04-13 ENCOUNTER — Other Ambulatory Visit: Payer: Self-pay | Admitting: Cardiovascular Disease

## 2020-05-01 ENCOUNTER — Other Ambulatory Visit: Payer: Self-pay | Admitting: Cardiovascular Disease

## 2020-05-03 NOTE — Telephone Encounter (Signed)
Pt's age 80, wt 85.7 kg, SCr 0.99, CrCl 74.54, last ov w/ CM 03/26/20.

## 2020-06-07 ENCOUNTER — Other Ambulatory Visit: Payer: Self-pay | Admitting: Cardiovascular Disease

## 2020-06-22 ENCOUNTER — Ambulatory Visit (INDEPENDENT_AMBULATORY_CARE_PROVIDER_SITE_OTHER): Payer: Medicare HMO

## 2020-06-22 DIAGNOSIS — I495 Sick sinus syndrome: Secondary | ICD-10-CM

## 2020-06-23 LAB — CUP PACEART REMOTE DEVICE CHECK
Battery Remaining Longevity: 14 mo
Battery Remaining Percentage: 10 %
Battery Voltage: 2.71 V
Brady Statistic AP VP Percent: 55 %
Brady Statistic AP VS Percent: 43 %
Brady Statistic AS VP Percent: 1 %
Brady Statistic AS VS Percent: 1 %
Brady Statistic RA Percent Paced: 98 %
Brady Statistic RV Percent Paced: 56 %
Date Time Interrogation Session: 20220524124513
Implantable Lead Implant Date: 20130425
Implantable Lead Implant Date: 20130425
Implantable Lead Location: 753859
Implantable Lead Location: 753860
Implantable Pulse Generator Implant Date: 20130425
Lead Channel Impedance Value: 390 Ohm
Lead Channel Impedance Value: 430 Ohm
Lead Channel Pacing Threshold Amplitude: 0.625 V
Lead Channel Pacing Threshold Amplitude: 1.125 V
Lead Channel Pacing Threshold Pulse Width: 0.4 ms
Lead Channel Pacing Threshold Pulse Width: 0.4 ms
Lead Channel Sensing Intrinsic Amplitude: 2.1 mV
Lead Channel Sensing Intrinsic Amplitude: 3.4 mV
Lead Channel Setting Pacing Amplitude: 1.375
Lead Channel Setting Pacing Amplitude: 1.625
Lead Channel Setting Pacing Pulse Width: 0.4 ms
Lead Channel Setting Sensing Sensitivity: 0.7 mV
Pulse Gen Model: 2110
Pulse Gen Serial Number: 7322329

## 2020-07-14 NOTE — Progress Notes (Signed)
Remote pacemaker transmission.   

## 2020-09-21 ENCOUNTER — Ambulatory Visit (INDEPENDENT_AMBULATORY_CARE_PROVIDER_SITE_OTHER): Payer: Medicare HMO

## 2020-09-21 ENCOUNTER — Telehealth: Payer: Self-pay

## 2020-09-21 DIAGNOSIS — I495 Sick sinus syndrome: Secondary | ICD-10-CM

## 2020-09-21 LAB — CUP PACEART REMOTE DEVICE CHECK
Battery Remaining Longevity: 3 mo
Battery Remaining Percentage: 2 %
Battery Voltage: 2.66 V
Brady Statistic AP VP Percent: 58 %
Brady Statistic AP VS Percent: 41 %
Brady Statistic AS VP Percent: 1 %
Brady Statistic AS VS Percent: 1 %
Brady Statistic RA Percent Paced: 98 %
Brady Statistic RV Percent Paced: 59 %
Date Time Interrogation Session: 20220822134018
Implantable Lead Implant Date: 20130425
Implantable Lead Implant Date: 20130425
Implantable Lead Location: 753859
Implantable Lead Location: 753860
Implantable Pulse Generator Implant Date: 20130425
Lead Channel Impedance Value: 380 Ohm
Lead Channel Impedance Value: 410 Ohm
Lead Channel Pacing Threshold Amplitude: 0.625 V
Lead Channel Pacing Threshold Amplitude: 1 V
Lead Channel Pacing Threshold Pulse Width: 0.4 ms
Lead Channel Pacing Threshold Pulse Width: 0.4 ms
Lead Channel Sensing Intrinsic Amplitude: 1.7 mV
Lead Channel Sensing Intrinsic Amplitude: 3.4 mV
Lead Channel Setting Pacing Amplitude: 1.25 V
Lead Channel Setting Pacing Amplitude: 1.625
Lead Channel Setting Pacing Pulse Width: 0.4 ms
Lead Channel Setting Sensing Sensitivity: 0.7 mV
Pulse Gen Model: 2110
Pulse Gen Serial Number: 7322329

## 2020-09-21 NOTE — Telephone Encounter (Signed)
Alert received for battery longevity <3 months secondary to [employer]. Patient notified. Monthly remotes scheduled. Patient appreciative of call. Noted patient has appointment with Dr. Caryl Comes 11/01/20. Will continue to monitor.

## 2020-10-06 NOTE — Progress Notes (Signed)
Remote pacemaker transmission.   

## 2020-10-30 ENCOUNTER — Other Ambulatory Visit: Payer: Self-pay | Admitting: Cardiovascular Disease

## 2020-11-01 ENCOUNTER — Ambulatory Visit: Payer: Medicare HMO | Admitting: Internal Medicine

## 2020-11-01 ENCOUNTER — Other Ambulatory Visit: Payer: Self-pay

## 2020-11-01 ENCOUNTER — Encounter: Payer: Self-pay | Admitting: Internal Medicine

## 2020-11-01 VITALS — BP 130/80 | HR 54 | Ht 66.0 in | Wt 193.4 lb

## 2020-11-01 DIAGNOSIS — I480 Paroxysmal atrial fibrillation: Secondary | ICD-10-CM | POA: Diagnosis not present

## 2020-11-01 DIAGNOSIS — Z79899 Other long term (current) drug therapy: Secondary | ICD-10-CM

## 2020-11-01 DIAGNOSIS — Z950 Presence of cardiac pacemaker: Secondary | ICD-10-CM

## 2020-11-01 DIAGNOSIS — I495 Sick sinus syndrome: Secondary | ICD-10-CM

## 2020-11-01 MED ORDER — ISOSORBIDE MONONITRATE ER 30 MG PO TB24: 60.0000 mg | ORAL_TABLET | Freq: Every day | ORAL | 3 refills | Status: DC

## 2020-11-01 MED ORDER — FUROSEMIDE 40 MG PO TABS: ORAL_TABLET | ORAL | 3 refills | Status: DC

## 2020-11-01 NOTE — Progress Notes (Signed)
Patient Care Team: Spry, Marsh Dolly., MD as PCP - General (Family Medicine) Burnell Blanks, MD as PCP - Cardiology (Cardiology) [name], MD as PCP - Electrophysiology (Cardiology)   HPI  Dylan Roth is a 80 y.o. male Seen in followup for atrial fibrillation with  posttermination pauses for which he underwent pacemaker implantation 4/13. He has been treated with sotalol and Rivaroxaban then on warfarin and now on apixaban  He has a history of ischemic heart disease with prior bypass surgery 2005.    DATE TEST EF   9/14 Echo  55-65   8/17 LHC   % 3/5 grafts patent   1/20 Echo  65-70%     While he is active, he has noticed becoming short of breath more easily. He walks up an incline to get the mail or take the trash out. At the top he is short winded and has minor chest tightness. Very rarely he has associated pain between his shoulder blades that will dissipate with rest. Normally he knows when to stop and rest before over-exertion. He is able to continue with tasks after a brief rest.  Also, he endorses chest pains that have occurred intermittently for years. When it occurs this will feel like a sharp muscle pain. Lately he has not experienced this much.  The patient denies nocturnal dyspnea, orthopnea or peripheral edema.  There have been no palpitations, lightheadedness or syncope.  Complains of bendopnea.    Date Cr K LDL Mg Hgb  8/17  0.94 4.4  1.9 14.2   12/17       15.2  2/19 1.04 4.7  2.0   1/20 1.12 4.5   13.0  10/21 0.99 4.7 45  13.1  4/22 (CE) 1.06 4.7   12.2    Date Rwave  7/18 5.0-6.3  10/20 3.1-5.0  1/22 3.9-4.5  10/22 3.6      Past Medical History:  Diagnosis Date   Anemia    Angina    Arthritis    "in my back"   Atrial fibrillation/flutter    BPH (benign prostatic hyperplasia)    CAD (coronary artery disease) Dec 2005   Hx MI. 80% left main, 80% LAD, 90% ramus, 50% circ, 90% in nondominant RCA,  EF normal  2010--echo   Cataract     bilateral-removed 6-7 years ago   Central obesity    Chronic back pain    Dysrhythmia    Erectile dysfunction    GERD (gastroesophageal reflux disease)    pt. denies   Gout    "once; I have it under control w/medicine"   History of anemia    History of colon polyps    Hyperlipidemia    Hypertension    IBS (irritable bowel syndrome)    with diarrhea   Myocardial infarction ([place])    pt. denies at preop   Pacemaker    Palpitations    Sinus node dysfunction ([place])    post termination pauses <5sec    Past Surgical History:  Procedure Laterality Date   CARDIAC CATHETERIZATION  2005   "that what sent me to CABG"   CARDIAC CATHETERIZATION N/A 09/22/2015   Procedure: Left Heart Cath and Cors/Grafts Angiography;  Surgeon: Burnell Blanks, MD;  Location: [place] [facility];  Service: Cardiovascular;  Laterality: N/A;   CATARACT EXTRACTION W/ INTRAOCULAR LENS  IMPLANT, BILATERAL  ~ 01/2011   CHOLECYSTECTOMY  1978    COLONOSCOPY  2014   (Polypectomy) colonic polyps status post  polypectomy. Pancolonic diverticulosis predominantly in the sigmoid colon. Small internal hemorrhoids. Patient also had perirectal excoriation   CORONARY ARTERY BYPASS GRAFT  2005   in [place] - LIMA to LAD, SVG to diagonal, SVG to ramus, SVG to OM and SVG to RCA    EYE SURGERY     retina surgey right eye in [place]   PACEMAKER INSERTION  2014   PERMANENT PACEMAKER INSERTION N/A 05/25/2011   Procedure: PERMANENT PACEMAKER INSERTION;  Surgeon: [name], MD;  Location: [facility] CATH LAB;  Service: Cardiovascular;  Laterality: N/A;   TOTAL KNEE ARTHROPLASTY Right 12/16/2018   Procedure: TOTAL KNEE ARTHROPLASTY;  Surgeon: Vickey Huger, MD;  Location: [facility];  Service: Orthopedics;  Laterality: Right;  75 mins needed for length of case   TRANSURETHRAL RESECTION OF PROSTATE N/A 12/29/2013   Procedure: TRANSURETHRAL RESECTION OF THE PROSTATE WITH GYRUS INSTRUMENTS;  Surgeon: [name], MD;  Location:  [facility];  Service: Urology;  Laterality: N/A;    Current Outpatient Medications  Medication Sig Dispense Refill   allopurinol (ZYLOPRIM) 300 MG tablet Take 300 mg by mouth daily.     apixaban (ELIQUIS) 5 MG TABS tablet Take 1 tablet (5 mg total) by mouth 2 (two) times daily. 180 tablet 1   atorvastatin (LIPITOR) 40 MG tablet Take 40 mg by mouth at bedtime.      carvedilol (COREG) 3.125 MG tablet TAKE 1 TABLET TWICE DAILY WITH A MEAL 180 tablet 3   escitalopram (LEXAPRO) 10 MG tablet daily in the afternoon.     isosorbide mononitrate (IMDUR) 30 MG 24 hr tablet TAKE 1 TABLET TWICE DAILY 180 tablet 3   methocarbamol (ROBAXIN) 500 MG tablet Take 1-2 tablets (500-1,000 mg total) by mouth every 6 (six) hours as needed for muscle spasms. 60 tablet 0   nitroGLYCERIN (NITROSTAT) 0.4 MG SL tablet Place 1 tablet (0.4 mg total) under the tongue every 5 (five) minutes as needed for chest pain. DO NOT EXCEED A TOTAL OF 3 DOSES IN 15 MINUTES 75 tablet 1   ranolazine (RANEXA) 500 MG 12 hr tablet TAKE 1 TABLET TWICE DAILY 180 tablet 3   sotalol (BETAPACE) 120 MG tablet TAKE 1 TABLET EVERY 12 HOURS 180 tablet 3   vitamin B-12 (CYANOCOBALAMIN) 500 MCG tablet Take 500 mcg by mouth daily.     No current facility-administered medications for this visit.    Allergies  Allergen Reactions   Sulfa Antibiotics Other (See Comments)    SWELLING ON PATIENTS LIPS, HE GETS DRIED UP   Sulfamethoxazole Other (See Comments)    Unknown   Sulfamethoxazole-Trimethoprim Other (See Comments)    Unknown    Review of Systems negative except from HPI and PMH  Physical Exam BP 130/80   Pulse (!) 54   Ht 5\' 6"  (1.676 m)   Wt 193 lb 6.4 oz (87.7 kg)   SpO2 96%   BMI 31.22 kg/m  Well developed and well nourished in no acute distress HENT normal Neck supple with JVP-flat, CVP 8 Clear Device pocket well healed; without hematoma or erythema.  There is no tethering  Regular rate and rhythm, no  gallop No murmur Abd-soft  with active BS No Clubbing cyanosis  edema Skin-warm and dry A & Oriented  Grossly normal sensory and motor function  ECG AV pacing at 54  Device interrogation demonstrates 100% atrial pacing 60% ventricular pacing heart rate excursion is reasonable      Assessment and  Plan  Ischemic heart disease with  prior bypass surgery--New ECG changes  Exertional dyspnea-   Sinus node dysfunction and chronotropic incompetence  Pacemaker-[employer]    High Risk Medication Surveillance-sotalol  AF paroxsymal  AV conduction 1 AVBlock  Infrequent and brief recurrent atrial fibrillation.  We will continue him on sotalol 120 twice daily and apixaban 5 twice daily.  He will need a CBC as well as surveillance laboratories for follow-up.  Regarding his QT interval is adequate.  Exertional dyspnea may be related to left ventricular dysfunction worsening in the setting of increased ventricular pacing.  We will check an echocardiogram as we are approaching ERI to be be appropriate to consider CRT upgrade.  He has some extra volume.  We will put him on a short dose of diuretics and see how he responds, I also worry, particularly with his exertional chest discomfort and intrascapular pain that there may be an anginal equivalent contributing and we will change his nitrates to daily instead of twice daily to give him a nitrate free interval and have him take 60 mg every morning.  Continue ranolazine 500 twice daily and carvedilol 3.125.    I,Mathew Stumpf,acting as a scribe for Virl Axe, MD.,have documented all relevant documentation on the behalf of Virl Axe, MD,as directed by  Virl Axe, MD while in the presence of Virl Axe, MD.   I, Virl Axe, MD, have reviewed all documentation for this visit. The documentation on 11/01/20 for the exam, diagnosis, procedures, and orders are all accurate and complete.

## 2020-11-01 NOTE — Telephone Encounter (Signed)
Prescription refill request for Eliquis received. Indication: afib  Last office visit: [place], 03/26/2020 Scr: 1.06, 05/04/2020 Age: 80 yo  Weight: 85.7 kg   Refill sent.

## 2020-11-01 NOTE — Progress Notes (Signed)
Error

## 2020-11-01 NOTE — Patient Instructions (Addendum)
Medication Instructions:  Your physician has recommended you make the following change in your medication:   ** Begin Furosemide 40mg  - 1 tablet by mouth daily x 3 days then decrease to 1 tablet by mouth twice weekly.  ** Take your Imdur 30mg  - 2 tablet by mouth in the mornings.  *If you need a refill on your cardiac medications before your next appointment, please call your pharmacy*   Lab Work: CBC, Mg and BMET  If you have labs (blood work) drawn today and your tests are completely normal, you will receive your results only by: [place] (if you have MyChart) OR A paper copy in the mail If you have any lab test that is abnormal or we need to change your treatment, we will call you to review the results.   Testing/Procedures: Your physician has requested that you have an echocardiogram. Echocardiography is a painless test that uses sound waves to create images of your heart. It provides your doctor with information about the size and shape of your heart and how well your heart's chambers and valves are working. This procedure takes approximately one hour. There are no restrictions for this procedure.    Follow-Up: At [facility], you and your health needs are our priority.  As part of our continuing mission to provide you with exceptional heart care, we have created designated Provider Care Teams.  These Care Teams include your primary Cardiologist (physician) and Advanced Practice Providers (APPs -  Physician Assistants and Nurse Practitioners) who all work together to provide you with the care you need, when you need it.  We recommend signing up for the patient portal called "MyChart".  Sign up information is provided on this After Visit Summary.  MyChart is used to connect with patients for Virtual Visits (Telemedicine).  Patients are able to view lab/test results, encounter notes, upcoming appointments, etc.  Non-urgent messages can be sent to your provider as well.   To  learn more about what you can do with MyChart, go to [email or web address].    Your next appointment:   Dr Julianne Handler 12/2020  Follow up with Dr Caryl Comes in 6 months   Other Instructions  Please send you pacemaker remote checks monthly now as your device is getting close to needing replacement.

## 2020-11-02 LAB — BASIC METABOLIC PANEL
BUN/Creatinine Ratio: 16 (ref 10–24)
BUN: 16 mg/dL (ref 8–27)
CO2: 25 mmol/L (ref 20–29)
Calcium: 9.5 mg/dL (ref 8.6–10.2)
Chloride: 98 mmol/L (ref 96–106)
Creatinine, Ser: 1 mg/dL (ref 0.76–1.27)
Glucose: 80 mg/dL (ref 70–99)
Potassium: 4.9 mmol/L (ref 3.5–5.2)
Sodium: 138 mmol/L (ref 134–144)
eGFR: 77 mL/min/{1.73_m2} (ref >59)

## 2020-11-02 LAB — MAGNESIUM: Magnesium: 1.8 mg/dL (ref 1.6–2.3)

## 2020-11-02 LAB — CBC
Hematocrit: 39.5 % (ref 37.5–51.0)
Hemoglobin: 13.8 g/dL (ref 13.0–17.7)
MCH: 35 pg — ABNORMAL HIGH (ref 26.6–33.0)
MCHC: 34.9 g/dL (ref 31.5–35.7)
MCV: 100 fL — ABNORMAL HIGH (ref 79–97)
Platelets: 161 10*3/uL (ref 150–450)
RBC: 3.94 x10E6/uL — ABNORMAL LOW (ref 4.14–5.80)
RDW: 13.2 % (ref 11.6–15.4)
WBC: 5.6 10*3/uL (ref 3.4–10.8)

## 2020-11-08 ENCOUNTER — Telehealth: Payer: Self-pay

## 2020-11-08 NOTE — Telephone Encounter (Signed)
Attempted phone call to pt.  Per Epic ok to leave voicemail message.  Advised per Dr Caryl Comes labs are normal.  Contact RN at [phone] for further questions or concerns.

## 2020-11-08 NOTE — Telephone Encounter (Signed)
-----   Message from [name], MD sent at 11/03/2020  7:40 PM EDT ----- Please Inform Patient that labs are normal  Thanks

## 2020-11-15 ENCOUNTER — Ambulatory Visit (HOSPITAL_COMMUNITY): Payer: Medicare HMO | Attending: Internal Medicine

## 2020-11-15 ENCOUNTER — Other Ambulatory Visit: Payer: Self-pay

## 2020-11-15 DIAGNOSIS — I480 Paroxysmal atrial fibrillation: Secondary | ICD-10-CM | POA: Diagnosis present

## 2020-11-15 LAB — ECHOCARDIOGRAM COMPLETE
Area-P 1/2: 3.39 cm2
P 1/2 time: 529 msec
S' Lateral: 3 cm

## 2020-11-19 ENCOUNTER — Telehealth: Payer: Self-pay

## 2020-11-19 NOTE — Telephone Encounter (Signed)
-----   Message from [name], MD sent at 11/17/2020 12:41 PM EDT ----- Please Inform Patient Dylan Roth showed  normal heart muscle function so no need to consider adding the wires when we change battery  We need to work more on the shortness of breath

## 2020-11-19 NOTE — Telephone Encounter (Signed)
Spoke with pt and advised per Dr Caryl Comes echo shows normal heart muscle function.  Pt advised no need to add wires when battery is changed out.

## 2020-11-21 ENCOUNTER — Other Ambulatory Visit: Payer: Self-pay | Admitting: Cardiovascular Disease

## 2020-11-22 ENCOUNTER — Ambulatory Visit (INDEPENDENT_AMBULATORY_CARE_PROVIDER_SITE_OTHER): Payer: Medicare HMO

## 2020-11-22 DIAGNOSIS — I495 Sick sinus syndrome: Secondary | ICD-10-CM

## 2020-11-22 LAB — CUP PACEART REMOTE DEVICE CHECK
Battery Remaining Longevity: 1 mo
Battery Remaining Percentage: 1 %
Battery Voltage: 2.63 V
Brady Statistic AP VP Percent: 54 %
Brady Statistic AP VS Percent: 45 %
Brady Statistic AS VP Percent: 1 %
Brady Statistic AS VS Percent: 1 %
Brady Statistic RA Percent Paced: 98 %
Brady Statistic RV Percent Paced: 55 %
Date Time Interrogation Session: 20221024131646
Implantable Lead Implant Date: 20130425
Implantable Lead Implant Date: 20130425
Implantable Lead Location: 753859
Implantable Lead Location: 753860
Implantable Pulse Generator Implant Date: 20130425
Lead Channel Impedance Value: 390 Ohm
Lead Channel Impedance Value: 410 Ohm
Lead Channel Pacing Threshold Amplitude: 0.625 V
Lead Channel Pacing Threshold Amplitude: 0.875 V
Lead Channel Pacing Threshold Pulse Width: 0.4 ms
Lead Channel Pacing Threshold Pulse Width: 0.4 ms
Lead Channel Sensing Intrinsic Amplitude: 1.7 mV
Lead Channel Sensing Intrinsic Amplitude: 3.9 mV
Lead Channel Setting Pacing Amplitude: 1.125
Lead Channel Setting Pacing Amplitude: 1.625
Lead Channel Setting Pacing Pulse Width: 0.4 ms
Lead Channel Setting Sensing Sensitivity: 0.7 mV
Pulse Gen Model: 2110
Pulse Gen Serial Number: 7322329

## 2020-11-30 NOTE — Progress Notes (Signed)
Remote pacemaker transmission.   

## 2020-12-27 ENCOUNTER — Ambulatory Visit (INDEPENDENT_AMBULATORY_CARE_PROVIDER_SITE_OTHER): Payer: Medicare HMO

## 2020-12-27 DIAGNOSIS — I495 Sick sinus syndrome: Secondary | ICD-10-CM

## 2020-12-31 LAB — CUP PACEART REMOTE DEVICE CHECK
Battery Remaining Longevity: 1 mo
Battery Remaining Percentage: 0.5 %
Battery Voltage: 2.62 V
Brady Statistic AP VP Percent: 59 %
Brady Statistic AP VS Percent: 40 %
Brady Statistic AS VP Percent: 1 %
Brady Statistic AS VS Percent: 1 %
Brady Statistic RA Percent Paced: 98 %
Brady Statistic RV Percent Paced: 59 %
Date Time Interrogation Session: 20221129123038
Implantable Lead Implant Date: 20130425
Implantable Lead Implant Date: 20130425
Implantable Lead Location: 753859
Implantable Lead Location: 753860
Implantable Pulse Generator Implant Date: 20130425
Lead Channel Impedance Value: 430 Ohm
Lead Channel Impedance Value: 430 Ohm
Lead Channel Pacing Threshold Amplitude: 0.625 V
Lead Channel Pacing Threshold Amplitude: 0.875 V
Lead Channel Pacing Threshold Pulse Width: 0.4 ms
Lead Channel Pacing Threshold Pulse Width: 0.4 ms
Lead Channel Sensing Intrinsic Amplitude: 2.3 mV
Lead Channel Sensing Intrinsic Amplitude: 4.3 mV
Lead Channel Setting Pacing Amplitude: 1.125
Lead Channel Setting Pacing Amplitude: 1.625
Lead Channel Setting Pacing Pulse Width: 0.4 ms
Lead Channel Setting Sensing Sensitivity: 0.7 mV
Pulse Gen Model: 2110
Pulse Gen Serial Number: 7322329

## 2021-01-03 ENCOUNTER — Encounter: Payer: Self-pay | Admitting: Cardiovascular Disease

## 2021-01-03 ENCOUNTER — Ambulatory Visit: Payer: Medicare HMO | Admitting: Cardiovascular Disease

## 2021-01-03 ENCOUNTER — Other Ambulatory Visit: Payer: Self-pay

## 2021-01-03 VITALS — BP 128/74 | HR 70 | Ht 66.0 in | Wt 191.6 lb

## 2021-01-03 DIAGNOSIS — E7800 Pure hypercholesterolemia, unspecified: Secondary | ICD-10-CM

## 2021-01-03 DIAGNOSIS — I10 Essential (primary) hypertension: Secondary | ICD-10-CM

## 2021-01-03 DIAGNOSIS — I25119 Atherosclerotic heart disease of native coronary artery with unspecified angina pectoris: Secondary | ICD-10-CM | POA: Diagnosis not present

## 2021-01-03 DIAGNOSIS — I480 Paroxysmal atrial fibrillation: Secondary | ICD-10-CM

## 2021-01-03 DIAGNOSIS — I495 Sick sinus syndrome: Secondary | ICD-10-CM

## 2021-01-03 NOTE — Patient Instructions (Signed)
Medication Instructions:  ?No changes ?*If you need a refill on your cardiac medications before your next appointment, please call your pharmacy* ? ? ?Lab Work: ?none ?If you have labs (blood work) drawn today and your tests are completely normal, you will receive your results only by: ?MyChart Message (if you have MyChart) OR ?A paper copy in the mail ?If you have any lab test that is abnormal or we need to change your treatment, we will call you to review the results. ? ? ?Testing/Procedures: ?none ? ? ?Follow-Up: ?At [facility], you and your health needs are our priority.  As part of our continuing mission to provide you with exceptional heart care, we have created designated Provider Care Teams.  These Care Teams include your primary Cardiologist (physician) and Advanced Practice Providers (APPs -  Physician Assistants and Nurse Practitioners) who all work together to provide you with the care you need, when you need it. ? ?We recommend signing up for the patient portal called "MyChart".  Sign up information is provided on this After Visit Summary.  MyChart is used to connect with patients for Virtual Visits (Telemedicine).  Patients are able to view lab/test results, encounter notes, upcoming appointments, etc.  Non-urgent messages can be sent to your provider as well.   ?To learn more about what you can do with MyChart, go to [email or web address].   ? ?Your next appointment:   ?12 month(s) ? ?The format for your next appointment:   ?In Person ? ?Provider:   ?Christopher McAlhany, MD   ? ? ?Other Instructions ?  ?

## 2021-01-03 NOTE — Progress Notes (Signed)
Chief Complaint  Patient presents with   Follow-up    CAD    History of Present Illness: 80 yo male with history of CAD s/p 5V CABG 2005, paroxysmal atrial fibrillation/flutter, HTN, HLD, sinus node dysfunction s/p pacemaker implantation who is here today for cardiac follow up. His atrial fibrillation/flutter and pacemaker has been followed by Dr. Caryl Comes. He saw Dr. Caryl Comes in April 2013 and was started on dofetilide for atrial fibrillation. He had post-termination pauses and had a pacemaker implanted in April 2013. He has since had Tikosyn stopped due to cost and was started on sotalol. He is on Eliquis.  I saw him in January 2017 and he c/o daily sharp chest pains. Imdur was added. I saw him in August 2017 and he continued to have chest pain several days per week. Cardiac cath 09/22/15 with 3 patent grafts but could not visualize the other two grafts. The RCA was occluded proximally with no graft to this vessel. We discussed a cardiac CTA but he refused due to claustrophobia. Ranexa added to his regimen in December 2017. Admitted to [facility] in January 2020 with chest pain. Negative troponin. Echo January 2020 with LVEF=65-70%. Thickened aortic valve leaflets but no aortic stenosis. His chest pain was not felt to be cardiac related. Echo October 2022 with LVEF=55%, mild MR, mild to moderate AI.   He is here today for follow up. The patient denies any chest pain, palpitations, lower extremity edema, orthopnea, PND, dizziness, near syncope or syncope. He has baseline dyspnea on exertion. No change. He is planning to have his pacemaker replacement soon with Dr. Caryl Comes.    Primary Care Physician: Verdell Carmine., MD  Past Medical History:  Diagnosis Date   Anemia    Angina    Arthritis    "in my back"   Atrial fibrillation/flutter    BPH (benign prostatic hyperplasia)    CAD (coronary artery disease) Dec 2005   Hx MI. 80% left main, 80% LAD, 90% ramus, 50% circ, 90% in nondominant RCA,  EF normal   2010--echo   Cataract    bilateral-removed 6-7 years ago   Central obesity    Chronic back pain    Dysrhythmia    Erectile dysfunction    GERD (gastroesophageal reflux disease)    pt. denies   Gout    "once; I have it under control w/medicine"   History of anemia    History of colon polyps    Hyperlipidemia    Hypertension    IBS (irritable bowel syndrome)    with diarrhea   Myocardial infarction ([place])    pt. denies at preop   Pacemaker    Palpitations    Sinus node dysfunction ([place])    post termination pauses <5sec    Past Surgical History:  Procedure Laterality Date   CARDIAC CATHETERIZATION  2005   "that what sent me to CABG"   CARDIAC CATHETERIZATION N/A 09/22/2015   Procedure: Left Heart Cath and Cors/Grafts Angiography;  Surgeon: Burnell Blanks, MD;  Location: [place] [facility];  Service: Cardiovascular;  Laterality: N/A;   CATARACT EXTRACTION W/ INTRAOCULAR LENS  IMPLANT, BILATERAL  ~ 01/2011   CHOLECYSTECTOMY  1978    COLONOSCOPY  2014   (Polypectomy) colonic polyps status post polypectomy. Pancolonic diverticulosis predominantly in the sigmoid colon. Small internal hemorrhoids. Patient also had perirectal excoriation   CORONARY ARTERY BYPASS GRAFT  2005   in [place] - LIMA to LAD, SVG to diagonal, SVG to ramus, SVG  to OM and SVG to RCA    EYE SURGERY     retina surgey right eye in [place]   PACEMAKER INSERTION  2014   PERMANENT PACEMAKER INSERTION N/A 05/25/2011   Procedure: PERMANENT PACEMAKER INSERTION;  Surgeon: [name], MD;  Location: [facility] CATH LAB;  Service: Cardiovascular;  Laterality: N/A;   TOTAL KNEE ARTHROPLASTY Right 12/16/2018   Procedure: TOTAL KNEE ARTHROPLASTY;  Surgeon: Vickey Huger, MD;  Location: [facility];  Service: Orthopedics;  Laterality: Right;  75 mins needed for length of case   TRANSURETHRAL RESECTION OF PROSTATE N/A 12/29/2013   Procedure: TRANSURETHRAL RESECTION OF THE PROSTATE WITH GYRUS INSTRUMENTS;  Surgeon: [name], MD;  Location: [facility];  Service: Urology;  Laterality: N/A;    Current Outpatient Medications  Medication Sig Dispense Refill   allopurinol (ZYLOPRIM) 300 MG tablet Take 300 mg by mouth daily.     apixaban (ELIQUIS) 5 MG TABS tablet Take 1 tablet (5 mg total) by mouth 2 (two) times daily. 180 tablet 1   atorvastatin (LIPITOR) 40 MG tablet Take 40 mg by mouth at bedtime.      carvedilol (COREG) 3.125 MG tablet TAKE 1 TABLET TWICE DAILY WITH MEALS 120 tablet 1   diphenhydramine-acetaminophen (TYLENOL PM) 25-500 MG TABS tablet Take 2 tablets by mouth at bedtime.     escitalopram (LEXAPRO) 10 MG tablet daily in the afternoon.     isosorbide mononitrate (IMDUR) 30 MG 24 hr tablet Take 2 tablets (60 mg total) by mouth daily. Take medication as prescribed in the mornings. 180 tablet 3   methocarbamol (ROBAXIN) 500 MG tablet Take 1-2 tablets (500-1,000 mg total) by mouth every 6 (six) hours as needed for muscle spasms. 60 tablet 0   nitroGLYCERIN (NITROSTAT) 0.4 MG SL tablet Place 1 tablet (0.4 mg total) under the tongue every 5 (five) minutes as needed for chest pain. DO NOT EXCEED A TOTAL OF 3 DOSES IN 15 MINUTES 75 tablet 1   Omega-3 Fatty Acids (FISH OIL) 1000 MG CAPS Take 1 capsule by mouth daily.     ranolazine (RANEXA) 500 MG 12 hr tablet TAKE 1 TABLET TWICE DAILY 180 tablet 3   sotalol (BETAPACE) 120 MG tablet TAKE 1 TABLET EVERY 12 HOURS 180 tablet 3   Turmeric 500 MG CAPS Take 1 tablet by mouth daily.     No current facility-administered medications for this visit.    Allergies  Allergen Reactions   Sulfa Antibiotics Other (See Comments)    SWELLING ON PATIENTS LIPS, HE GETS DRIED UP   Sulfamethoxazole Other (See Comments)    Unknown   Sulfamethoxazole-Trimethoprim Other (See Comments)    Unknown    Social History   Socioeconomic History   Marital status: Married    Spouse name: Not on file   Number of children: 2   Years of education: Not on file   Highest education  level: Not on file  Occupational History   Occupation: retired-truck Education administrator: RETIRED  Tobacco Use   Smoking status: Former    Packs/day: 0.00    Years: 0.00    Pack years: 0.00    Types: Cigarettes    Quit date: 01/30/1961    Years since quitting: 59.9   Smokeless tobacco: Never  Vaping Use   Vaping Use: Never used  Substance and Sexual Activity   Alcohol use: No    Comment: 2 drinks (bourbon) a night   Drug use: No   Sexual activity:  Yes  Other Topics Concern   Not on file  Social History Narrative   Married. Pt works part-time for a [employer]. Quit tobacco 50 years ago..Both parents are deceased, neither one had cardiac issues and no siblings have cardiac issues.    Social Determinants of Health   Financial Resource Strain: Not on file  Food Insecurity: Not on file  Transportation Needs: Not on file  Physical Activity: Not on file  Stress: Not on file  Social Connections: Not on file  Intimate Partner Violence: Not on file    Family History  Problem Relation Age of Onset   Cancer Mother    Stroke Brother    Heart attack Neg Hx    Colon cancer Neg Hx    Colon polyps Neg Hx    Heart disease Neg Hx    Esophageal cancer Neg Hx    Rectal cancer Neg Hx    Stomach cancer Neg Hx     Review of Systems:  As stated in the HPI and otherwise negative.   BP 128/74 (BP Location: Left Arm, Patient Position: Sitting, Cuff Size: Normal)   Pulse 70   Ht 5\' 6"  (1.676 m)   Wt 191 lb 9.6 oz (86.9 kg)   SpO2 95%   BMI 30.93 kg/m   Physical Examination: General: Well developed, well nourished, NAD  HEENT: OP clear, mucus membranes moist  SKIN: warm, dry. No rashes. Neuro: No focal deficits  Musculoskeletal: Muscle strength 5/5 all ext  Psychiatric: Mood and affect normal  Neck: No JVD, no carotid bruits, no thyromegaly, no lymphadenopathy.  Lungs:Clear bilaterally, no wheezes, rhonci, crackles Cardiovascular: Regular rate and rhythm. No murmurs, gallops or  rubs. Abdomen:Soft. Bowel sounds present. Non-tender.  Extremities: No lower extremity edema. Pulses are 2 + in the bilateral DP/PT.  Echo .11/15/20:  1. Left ventricular ejection fraction, by estimation, is 55%. The left  ventricle has normal function. The left ventricle has no regional wall  motion abnormalities. There is moderate asymmetric left ventricular  hypertrophy of the basal-septal segment (14   mm). Left ventricular diastolic parameters are consistent with Grade I  diastolic dysfunction (impaired relaxation).   2. Right ventricular systolic function is normal. The right ventricular  size is normal.   3. Left atrial size was mildly dilated.   4. Right atrial size was mildly dilated.   5. The mitral valve is normal in structure. Mild mitral valve  regurgitation. No evidence of mitral stenosis.   6. The aortic valve was not well visualized for morphology. There is mild  calcification of the aortic valve. Aortic valve regurgitation is  mild-moderate. No aortic stenosis is present.   EKG:  EKG is not ordered today. The ekg ordered today demonstrates   Recent Labs: 11/01/2020: BUN 16; Creatinine, Ser 1.00; Hemoglobin 13.8; Magnesium 1.8; Platelets 161; Potassium 4.9; Sodium 138   Lipid Panel Followed in primary care   Wt Readings from Last 3 Encounters:  01/03/21 191 lb 9.6 oz (86.9 kg)  11/01/20 193 lb 6.4 oz (87.7 kg)  03/26/20 189 lb (85.7 kg)     Other studies Reviewed: Additional studies/ records that were reviewed today include: . Review of the above records demonstrates:   Assessment and Plan:   1. CAD s/p CABG with angina: Last cardiac cath in August 2017 but I could not find two of his grafts. He has refused coronary CTA to better define his graft anatomy. He continues to have occasional chest pains but no stable pattern  to suggest unstable angina. Will continue Ranexa, Imdur, beta blocker and statin. He has not been on an ASA since he is on Eliquis.   2.  Atrial fibrillation, paroxysmal/Sick sinus syndrome: Regular on exam today. Followed by Dr. Caryl Comes. Pacemaker in place for post termination pauses/sinus node dysfunction. Continue beta blocker, sotalol, Eliquis.   3. HTN: BP is controlled. No changes today  4. HLD: His lipids are followed in primary care.  No LDL on the KPN form. Will continue statin.     Current medicines are reviewed at length with the patient today.  The patient does not have concerns regarding medicines.  The following changes have been made:  no change  Labs/ tests ordered today include:   No orders of the defined types were placed in this encounter.   Disposition:   F/U with me in 12 months  Signed, Lauree Chandler, MD 01/03/2021 3:47 PM    [facility] [address], [place], [place]  [address] Phone: [phone]; Fax: [phone]

## 2021-01-04 NOTE — Progress Notes (Signed)
Remote pacemaker transmission.   

## 2021-01-25 ENCOUNTER — Ambulatory Visit (INDEPENDENT_AMBULATORY_CARE_PROVIDER_SITE_OTHER): Payer: Medicare HMO

## 2021-01-25 DIAGNOSIS — I495 Sick sinus syndrome: Secondary | ICD-10-CM

## 2021-01-25 LAB — CUP PACEART REMOTE DEVICE CHECK
Battery Remaining Longevity: 1 mo
Battery Remaining Percentage: 0.5 %
Battery Voltage: 2.6 V
Brady Statistic AP VP Percent: 60 %
Brady Statistic AP VS Percent: 39 %
Brady Statistic AS VP Percent: 1 %
Brady Statistic AS VS Percent: 1 %
Brady Statistic RA Percent Paced: 98 %
Brady Statistic RV Percent Paced: 61 %
Date Time Interrogation Session: 20221227124720
Implantable Lead Implant Date: 20130425
Implantable Lead Implant Date: 20130425
Implantable Lead Location: 753859
Implantable Lead Location: 753860
Implantable Pulse Generator Implant Date: 20130425
Lead Channel Impedance Value: 390 Ohm
Lead Channel Impedance Value: 430 Ohm
Lead Channel Pacing Threshold Amplitude: 0.75 V
Lead Channel Pacing Threshold Amplitude: 0.875 V
Lead Channel Pacing Threshold Pulse Width: 0.4 ms
Lead Channel Pacing Threshold Pulse Width: 0.4 ms
Lead Channel Sensing Intrinsic Amplitude: 2 mV
Lead Channel Sensing Intrinsic Amplitude: 4.3 mV
Lead Channel Setting Pacing Amplitude: 1.125
Lead Channel Setting Pacing Amplitude: 1.75 V
Lead Channel Setting Pacing Pulse Width: 0.4 ms
Lead Channel Setting Sensing Sensitivity: 0.7 mV
Pulse Gen Model: 2110
Pulse Gen Serial Number: 7322329

## 2021-01-26 ENCOUNTER — Telehealth: Payer: Self-pay

## 2021-01-26 NOTE — Progress Notes (Signed)
Cardiology Office Note Date:  01/27/2021  Patient ID:  Dylan Roth, DOB [birth year], MRN  PCP:  Verdell Carmine., MD  Cardiologist:  Dr. Angelena Form Electrophysiologist: Dr. Caryl Comes   Chief Complaint: ERI  History of Present Illness: Dylan Roth is a 80 y.o. male with history of CAD (CABG 2005, Cardiac cath 09/22/15 with 3 patent grafts but could not visualize the other two grafts. The RCA was occluded proximally with no graft to this vesse), AFib, post termination pauses/sinus node dysfunction w/PPM, HTN, HLD, and Obesity  He comes in today to be seen for Dr. Caryl Comes, last seen by him Oct 2022, discussed some exertional symptoms found to be volume OL with plans for echo and short course of diuretic added, his nitrate adjusted as well. TTE noted preserved LVEF  He saw Dr. Angelena Form 01/03/21, at that time no new cardiac complaints, with some stable angina, no changes were made.   TODAY He is accompanied by his wife He feels well, thinks he is generally slowing a bit, feels more tired then he used to.  Timeline is hard, not an acute change. Still active gets his ADLs done, intentionally stays active/moving. His wife states that when he feels tired/gets winded he is doing heavy work, more then he "should be at his age" No near syncope or syncope No palpitations or CP  No bleeding or signs of bleeding  Device Information SJM dual chamber PPM implanted 05/25/11, Dr. Lovena Le, [facility]  AFib Hx Dx 2012, ?flutter Tikosyn stopped 2/2 cost >> Sotalol remains current Xarelto stopped 2/2 cost ? bleeding>> coumadin    Past Medical History:  Diagnosis Date   Anemia    Angina    Arthritis    "in my back"   Atrial fibrillation/flutter    BPH (benign prostatic hyperplasia)    CAD (coronary artery disease) Dec 2005   Hx MI. 80% left main, 80% LAD, 90% ramus, 50% circ, 90% in nondominant RCA,  EF normal  2010--echo   Cataract    bilateral-removed 6-7 years ago   Central obesity     Chronic back pain    Dysrhythmia    Erectile dysfunction    GERD (gastroesophageal reflux disease)    pt. denies   Gout    "once; I have it under control w/medicine"   History of anemia    History of colon polyps    Hyperlipidemia    Hypertension    IBS (irritable bowel syndrome)    with diarrhea   Myocardial infarction ([place])    pt. denies at preop   Pacemaker    Palpitations    Sinus node dysfunction ([place])    post termination pauses <5sec    Past Surgical History:  Procedure Laterality Date   CARDIAC CATHETERIZATION  2005   "that what sent me to CABG"   CARDIAC CATHETERIZATION N/A 09/22/2015   Procedure: Left Heart Cath and Cors/Grafts Angiography;  Surgeon: Burnell Blanks, MD;  Location: [place] [facility];  Service: Cardiovascular;  Laterality: N/A;   CATARACT EXTRACTION W/ INTRAOCULAR LENS  IMPLANT, BILATERAL  ~ 01/2011   CHOLECYSTECTOMY  1978    COLONOSCOPY  2014   (Polypectomy) colonic polyps status post polypectomy. Pancolonic diverticulosis predominantly in the sigmoid colon. Small internal hemorrhoids. Patient also had perirectal excoriation   CORONARY ARTERY BYPASS GRAFT  2005   in [place] - LIMA to LAD, SVG to diagonal, SVG to ramus, SVG to OM and SVG to RCA    EYE SURGERY  retina surgey right eye in [place]   PACEMAKER INSERTION  2014   PERMANENT PACEMAKER INSERTION N/A 05/25/2011   Procedure: PERMANENT PACEMAKER INSERTION;  Surgeon: [name], MD;  Location: [facility] CATH LAB;  Service: Cardiovascular;  Laterality: N/A;   TOTAL KNEE ARTHROPLASTY Right 12/16/2018   Procedure: TOTAL KNEE ARTHROPLASTY;  Surgeon: Vickey Huger, MD;  [facility]: [facility];  Service: Orthopedics;  Laterality: Right;  75 mins needed for length of case   TRANSURETHRAL RESECTION OF PROSTATE N/A 12/29/2013   Procedure: TRANSURETHRAL RESECTION OF THE PROSTATE WITH GYRUS INSTRUMENTS;  Surgeon: [name], MD;  Location: [facility];  Service: Urology;  Laterality: N/A;    Current  Outpatient Medications  Medication Sig Dispense Refill   allopurinol (ZYLOPRIM) 300 MG tablet Take 300 mg by mouth daily.     apixaban (ELIQUIS) 5 MG TABS tablet Take 1 tablet (5 mg total) by mouth 2 (two) times daily. 180 tablet 1   atorvastatin (LIPITOR) 40 MG tablet Take 40 mg by mouth at bedtime.      carvedilol (COREG) 3.125 MG tablet TAKE 1 TABLET TWICE DAILY WITH MEALS 120 tablet 1   diphenhydramine-acetaminophen (TYLENOL PM) 25-500 MG TABS tablet Take 2 tablets by mouth at bedtime.     escitalopram (LEXAPRO) 10 MG tablet daily in the afternoon.     isosorbide mononitrate (IMDUR) 30 MG 24 hr tablet Take 2 tablets (60 mg total) by mouth daily. Take medication as prescribed in the mornings. 180 tablet 3   methocarbamol (ROBAXIN) 500 MG tablet Take 1-2 tablets (500-1,000 mg total) by mouth every 6 (six) hours as needed for muscle spasms. 60 tablet 0   nitroGLYCERIN (NITROSTAT) 0.4 MG SL tablet Place 1 tablet (0.4 mg total) under the tongue every 5 (five) minutes as needed for chest pain. DO NOT EXCEED A TOTAL OF 3 DOSES IN 15 MINUTES 75 tablet 1   Omega-3 Fatty Acids (FISH OIL) 1000 MG CAPS Take 1 capsule by mouth daily.     ranolazine (RANEXA) 500 MG 12 hr tablet TAKE 1 TABLET TWICE DAILY 180 tablet 3   sotalol (BETAPACE) 120 MG tablet TAKE 1 TABLET EVERY 12 HOURS 180 tablet 3   Turmeric 500 MG CAPS Take 1 tablet by mouth daily.     No current facility-administered medications for this visit.    Allergies:   Sulfa antibiotics, Sulfamethoxazole, and Sulfamethoxazole-trimethoprim   Social History:  The patient  reports that he quit smoking about 60 years ago. His smoking use included cigarettes. He has never used smokeless tobacco. He reports that he does not drink alcohol and does not use drugs.   Family History:  The patient's family history includes Cancer in his mother; Stroke in his brother.  ROS:  Please see the history of present illness.    All other systems are reviewed and  otherwise negative.   PHYSICAL EXAM:  VS:  BP 130/60    Pulse 79    Ht 5\' 6"  (1.676 m)    Wt 194 lb (88 kg)    SpO2 97%    BMI 31.31 kg/m  BMI: Body mass index is 31.31 kg/m. Well nourished, well developed, in no acute distress  HEENT: normocephalic, atraumatic  Neck: no JVD, carotid bruits or masses Cardiac:  RRR no significant murmurs, no rubs, or gallops Lungs:  CTA b/l, no wheezing, rhonchi or rales  Abd: soft, nontender MS: no deformity, age appropriate atrophy Ext: no edema  Skin: warm and dry, no rash Neuro:  No gross deficits appreciated Psych: euthymic mood, full affect  PPM site is stable, no tethering or discomfort   EKG:  Done today and reviewed by myself  AP/VS 55bpm  PPM interrogation done today and reviewed by myself  Battery has reached ERI by voltage and magnet rate (not yet triggered ERI) Lead measurements are good + AMS Burden <1%  Echo .11/15/20:  1. Left ventricular ejection fraction, by estimation, is 55%. The left  ventricle has normal function. The left ventricle has no regional wall  motion abnormalities. There is moderate asymmetric left ventricular  hypertrophy of the basal-septal segment (14   mm). Left ventricular diastolic parameters are consistent with Grade I  diastolic dysfunction (impaired relaxation).   2. Right ventricular systolic function is normal. The right ventricular  size is normal.   3. Left atrial size was mildly dilated.   4. Right atrial size was mildly dilated.   5. The mitral valve is normal in structure. Mild mitral valve  regurgitation. No evidence of mitral stenosis.   6. The aortic valve was not well visualized for morphology. There is mild  calcification of the aortic valve. Aortic valve regurgitation is  mild-moderate. No aortic stenosis is present.   10/29/12: TTE Study Conclusions Left ventricle: The cavity size was normal. Wall thickness was increased in a pattern of mild LVH. Systolic function was normal. The  estimated ejection fraction was in the range of 55% to 60%. Wall motion was normal; there were no regional wall motion abnormalities. Doppler parameters are consistent with abnormal left ventricular relaxation (grade 1 diastolic dysfunction).  09/22/15: LHC Conclusion    Prox RCA to Mid RCA lesion, 100 %stenosed. Ost 2nd Diag lesion, 100 %stenosed. SVG and is normal in caliber and anatomically normal. LM lesion, 60 %stenosed. Ost Cx lesion, 50 %stenosed. Ost 1st Mrg lesion, 100 %stenosed. SVG graft was visualized by angiography and is normal in caliber and anatomically normal. Prox LAD lesion, 100 %stenosed. LIMA graft was visualized by angiography and is normal in caliber and anatomically normal. 1st Diag lesion, 90 %stenosed.   1. Severe triple vessel CAD s/p ?5V CABG with 3/5 patent bypass grafts. [place]. The documentation in our record over the years states 5V CABG. No vein graft markers found today so I cannot exclude patency of 2 other vein grafts. Unable to identify vein grafts with aortic root angiogram.  2. Occluded mid LAD with filling of the mid and distal vessel from the patent LIMA graft 3. Occluded intermediate and diagonal branches which fill from the patent vein grafts.  4. Cannot identify a graft to the Diagonal or the RCA 5. Moderate left main stenosis supplying the moderate caliber circumflex.  6. Dilatation of the aortic root.    Recommendations: No targets for PCI but cannot visualize 2 additional vein grafts that are reported in his medical record. Will arrange Coronary CTA to see if we can visualize additional bypass grafts and also to get a better estimate of the size of his aortic root.     Recent Labs: 11/01/2020: BUN 16; Creatinine, Ser 1.00; Hemoglobin 13.8; Magnesium 1.8; Platelets 161; Potassium 4.9; Sodium 138  No results found for requested labs within last 8760 hours.   CrCl cannot be calculated (Patient's most recent  lab result is older than the maximum 21 days allowed.).   Wt Readings from Last 3 Encounters:  01/27/21 194 lb (88 kg)  01/03/21 191 lb 9.6 oz (86.9 kg)  11/01/20 193  lb 6.4 oz (87.7 kg)     Other studies reviewed: Additional studies/records reviewed today include: summarized above  ASSESSMENT AND PLAN:  1. Sick sinus syndrome w/PPM     D/w [employer] rep, voltage and magnet rate qualifies for ERI    Discussed generator change procedure, potential risks/benefits, he is agreeable to proceed He is atrial dependence  2. Paroxysmal AFib     CHA2DS2Vasc is at least 4 on Eliquis     Sotalol QTc is stable, labs OK in Oct      3. CAD     No anginal sounding complaints     C/w Dr. Angelena Form  4. HTN     Looks OK   Disposition: routine post procedure follow up   Current medicines are reviewed at length with the patient today.  The patient did not have any concerns regarding medicines.  Haywood Lasso, PA-C 01/27/2021 5:52 PM     [address] [place] [id number] [phone] (office)  [phone] (fax)

## 2021-01-26 NOTE — Telephone Encounter (Signed)
Scheduled remote reviewed. Normal device function.   Known PAF, on OAC, AF burden is 1% of the time. Battery voltage is 2.60 volts and ERI is 2.60 volts.  The device has reached ERI, sent to triage  Pt last OV 11/01/20 , ERI discussed. Per notes pt as to be eval for possible upgrade.  Echo completed 11/15/20, notes indicate device upgrade not needed.    Spoke with pt wife, Rise Paganini (DPR on file).  Advised device reached ERI, will forward message to Dr. Olin Pia nurse Rosann Auerbach for scheduling.

## 2021-01-26 NOTE — H&P (View-Only) (Signed)
Cardiology Office Note Date:  01/27/2021  Patient ID:  Dylan Roth, DOB Nov 23, 1940, MRN  PCP:  Dylan Carmine., MD  Cardiologist:  Dr. Angelena Roth Electrophysiologist: Dr. Caryl Roth   Chief Complaint: ERI  History of Present Illness: Dylan Roth is a 80 y.o. male with history of CAD (CABG 2005, Cardiac cath 09/22/15 with 3 patent grafts but could not visualize the other two grafts. The RCA was occluded proximally with no graft to this vesse), AFib, post termination pauses/sinus node dysfunction w/PPM, HTN, HLD, and Obesity  He Roth in today to be seen for Dr. Caryl Roth, last seen by him Oct 2022, discussed some exertional symptoms found to be volume OL with plans for echo and short course of diuretic added, his nitrate adjusted as well. TTE noted preserved LVEF  He saw Dr. Angelena Roth 01/03/21, at that time no new cardiac complaints, with some stable angina, no changes were made.   TODAY He is accompanied by his wife He feels well, thinks he is generally slowing a bit, feels more tired then he used to.  Timeline is hard, not an acute change. Still active gets his ADLs done, intentionally stays active/moving. His wife states that when he feels tired/gets winded he is doing heavy work, more then he "should be at his age" No near syncope or syncope No palpitations or CP  No bleeding or signs of bleeding  Device Information SJM dual chamber PPM implanted 05/25/11, Dr. Lovena Le, SSSx  AFib Hx Dx 2012, ?flutter Tikosyn stopped 2/2 cost >> Sotalol remains current Xarelto stopped 2/2 cost ? bleeding>> coumadin    Past Medical History:  Diagnosis Date   Anemia    Angina    Arthritis    "in my back"   Atrial fibrillation/flutter    BPH (benign prostatic hyperplasia)    CAD (coronary artery disease) Dec 2005   Hx MI. 80% left main, 80% LAD, 90% ramus, 50% circ, 90% in nondominant RCA,  EF normal  2010--echo   Cataract    bilateral-removed 6-7 years ago   Central obesity     Chronic back pain    Dysrhythmia    Erectile dysfunction    GERD (gastroesophageal reflux disease)    pt. denies   Gout    "once; I have it under control w/medicine"   History of anemia    History of colon polyps    Hyperlipidemia    Hypertension    IBS (irritable bowel syndrome)    with diarrhea   Myocardial infarction ([place])    pt. denies at preop   Pacemaker    Palpitations    Sinus node dysfunction ([place])    post termination pauses <5sec    Past Surgical History:  Procedure Laterality Date   CARDIAC CATHETERIZATION  2005   "that what sent me to CABG"   CARDIAC CATHETERIZATION N/A 09/22/2015   Procedure: Left Heart Cath and Cors/Grafts Angiography;  Surgeon: Burnell Blanks, MD;  Location: [facility];  Service: Cardiovascular;  Laterality: N/A;   CATARACT EXTRACTION W/ INTRAOCULAR LENS  IMPLANT, BILATERAL  ~ 01/2011   CHOLECYSTECTOMY  1978    COLONOSCOPY  2014   (Polypectomy) colonic polyps status post polypectomy. Pancolonic diverticulosis predominantly in the sigmoid colon. Small internal hemorrhoids. Patient also had perirectal excoriation   CORONARY ARTERY BYPASS GRAFT  2005   in [place] - LIMA to LAD, SVG to diagonal, SVG to ramus, SVG to OM and SVG to RCA    EYE SURGERY  retina surgey right eye in [place]   PACEMAKER INSERTION  2014   PERMANENT PACEMAKER INSERTION N/A 05/25/2011   Procedure: PERMANENT PACEMAKER INSERTION;  Surgeon: [name], MD;  Location: [facility];  Service: Cardiovascular;  Laterality: N/A;   TOTAL KNEE ARTHROPLASTY Right 12/16/2018   Procedure: TOTAL KNEE ARTHROPLASTY;  Surgeon: Vickey Huger, MD;  Location: [facility];  Service: Orthopedics;  Laterality: Right;  75 mins needed for length of case   TRANSURETHRAL RESECTION OF PROSTATE N/A 12/29/2013   Procedure: TRANSURETHRAL RESECTION OF THE PROSTATE WITH GYRUS INSTRUMENTS;  Surgeon: [name], MD;  Location: [facility];  Service: Urology;  Laterality: N/A;    Current  Outpatient Medications  Medication Sig Dispense Refill   allopurinol (ZYLOPRIM) 300 MG tablet Take 300 mg by mouth daily.     apixaban (ELIQUIS) 5 MG TABS tablet Take 1 tablet (5 mg total) by mouth 2 (two) times daily. 180 tablet 1   atorvastatin (LIPITOR) 40 MG tablet Take 40 mg by mouth at bedtime.      carvedilol (COREG) 3.125 MG tablet TAKE 1 TABLET TWICE DAILY WITH MEALS 120 tablet 1   diphenhydramine-acetaminophen (TYLENOL PM) 25-500 MG TABS tablet Take 2 tablets by mouth at bedtime.     escitalopram (LEXAPRO) 10 MG tablet daily in the afternoon.     isosorbide mononitrate (IMDUR) 30 MG 24 hr tablet Take 2 tablets (60 mg total) by mouth daily. Take medication as prescribed in the mornings. 180 tablet 3   methocarbamol (ROBAXIN) 500 MG tablet Take 1-2 tablets (500-1,000 mg total) by mouth every 6 (six) hours as needed for muscle spasms. 60 tablet 0   nitroGLYCERIN (NITROSTAT) 0.4 MG SL tablet Place 1 tablet (0.4 mg total) under the tongue every 5 (five) minutes as needed for chest pain. DO NOT EXCEED A TOTAL OF 3 DOSES IN 15 MINUTES 75 tablet 1   Omega-3 Fatty Acids (FISH OIL) 1000 MG CAPS Take 1 capsule by mouth daily.     ranolazine (RANEXA) 500 MG 12 hr tablet TAKE 1 TABLET TWICE DAILY 180 tablet 3   sotalol (BETAPACE) 120 MG tablet TAKE 1 TABLET EVERY 12 HOURS 180 tablet 3   Turmeric 500 MG CAPS Take 1 tablet by mouth daily.     No current facility-administered medications for this visit.    Allergies:   Sulfa antibiotics, Sulfamethoxazole, and Sulfamethoxazole-trimethoprim   Social History:  The patient  reports that he quit smoking about 60 years ago. His smoking use included cigarettes. He has never used smokeless tobacco. He reports that he does not drink alcohol and does not use drugs.   Family History:  The patient's family history includes Cancer in his mother; Stroke in his brother.  ROS:  Please see the history of present illness.    All other systems are reviewed and  otherwise negative.   PHYSICAL EXAM:  VS:  BP 130/60    Pulse 79    Ht 5\' 6"  (1.676 m)    Wt 194 lb (88 kg)    SpO2 97%    BMI 31.31 kg/m  BMI: Body mass index is 31.31 kg/m. Well nourished, well developed, in no acute distress  HEENT: normocephalic, atraumatic  Neck: no JVD, carotid bruits or masses Cardiac:  RRR no significant murmurs, no rubs, or gallops Lungs:  CTA b/l, no wheezing, rhonchi or rales  Abd: soft, nontender MS: no deformity, age appropriate atrophy Ext: no edema  Skin: warm and dry, no rash Neuro:  No gross deficits appreciated Psych: euthymic mood, full affect  PPM site is stable, no tethering or discomfort   EKG:  Done today and reviewed by myself  AP/VS 55bpm  PPM interrogation done today and reviewed by myself  Battery has reached ERI by voltage and magnet rate (not yet triggered ERI) Lead measurements are good + AMS Burden <1%  Echo .11/15/20:  1. Left ventricular ejection fraction, by estimation, is 55%. The left  ventricle has normal function. The left ventricle has no regional wall  motion abnormalities. There is moderate asymmetric left ventricular  hypertrophy of the basal-septal segment (14   mm). Left ventricular diastolic parameters are consistent with Grade I  diastolic dysfunction (impaired relaxation).   2. Right ventricular systolic function is normal. The right ventricular  size is normal.   3. Left atrial size was mildly dilated.   4. Right atrial size was mildly dilated.   5. The mitral valve is normal in structure. Mild mitral valve  regurgitation. No evidence of mitral stenosis.   6. The aortic valve was not well visualized for morphology. There is mild  calcification of the aortic valve. Aortic valve regurgitation is  mild-moderate. No aortic stenosis is present.   10/29/12: TTE Study Conclusions Left ventricle: The cavity size was normal. Wall thickness was increased in a pattern of mild LVH. Systolic function was normal. The  estimated ejection fraction was in the range of 55% to 60%. Wall motion was normal; there were no regional wall motion abnormalities. Doppler parameters are consistent with abnormal left ventricular relaxation (grade 1 diastolic dysfunction).  09/22/15: LHC Conclusion    Prox RCA to Mid RCA lesion, 100 %stenosed. Ost 2nd Diag lesion, 100 %stenosed. SVG and is normal in caliber and anatomically normal. LM lesion, 60 %stenosed. Ost Cx lesion, 50 %stenosed. Ost 1st Mrg lesion, 100 %stenosed. SVG graft was visualized by angiography and is normal in caliber and anatomically normal. Prox LAD lesion, 100 %stenosed. LIMA graft was visualized by angiography and is normal in caliber and anatomically normal. 1st Diag lesion, 90 %stenosed.   1. Severe triple vessel CAD s/p ?5V CABG with 3/5 patent bypass grafts. His CAB[place] state. The documentation in our record over the years states 5V CABG. No vein graft markers found today so I cannot exclude patency of 2 other vein grafts. Unable to identify vein grafts with aortic root angiogram.  2. Occluded mid LAD with filling of the mid and distal vessel from the patent LIMA graft 3. Occluded intermediate and diagonal branches which fill from the patent vein grafts.  4. Cannot identify a graft to the Diagonal or the RCA 5. Moderate left main stenosis supplying the moderate caliber circumflex.  6. Dilatation of the aortic root.    Recommendations: No targets for PCI but cannot visualize 2 additional vein grafts that are reported in his medical record. Will arrange Coronary CTA to see if we can visualize additional bypass grafts and also to get a better estimate of the size of his aortic root.     Recent Labs: 11/01/2020: BUN 16; Creatinine, Ser 1.00; Hemoglobin 13.8; Magnesium 1.8; Platelets 161; Potassium 4.9; Sodium 138  No results found for requested labs within last 8760 hours.   CrCl cannot be calculated (Patient's most recent  lab result is older than the maximum 21 days allowed.).   Wt Readings from Last 3 Encounters:  01/27/21 194 lb (88 kg)  01/03/21 191 lb 9.6 oz (86.9 kg)  11/01/20 193  lb 6.4 oz (87.7 kg)     Other studies reviewed: Additional studies/records reviewed today include: summarized above  ASSESSMENT AND PLAN:  1. Sick sinus syndrome w/PPM     D/w [employer] rep, voltage and magnet rate qualifies for ERI    Discussed generator change procedure, potential risks/benefits, he is agreeable to proceed He is atrial dependence  2. Paroxysmal AFib     CHA2DS2Vasc is at least 4 on Eliquis     Sotalol QTc is stable, labs OK in Oct      3. CAD     No anginal sounding complaints     C/w Dr. Angelena Roth  4. HTN     Looks OK   Disposition: routine post procedure follow up   Current medicines are reviewed at length with the patient today.  The patient did not have any concerns regarding medicines.  Haywood Lasso, PA-C 01/27/2021 5:52 PM     [address] [place] [address] [phone] (office)  [phone] (fax)

## 2021-01-27 ENCOUNTER — Encounter: Payer: Self-pay | Admitting: Physician Assistant

## 2021-01-27 ENCOUNTER — Other Ambulatory Visit: Payer: Self-pay

## 2021-01-27 ENCOUNTER — Ambulatory Visit: Payer: Medicare HMO | Admitting: Physician Assistant

## 2021-01-27 ENCOUNTER — Encounter: Payer: Self-pay | Admitting: *Deleted

## 2021-01-27 VITALS — BP 130/60 | HR 79 | Ht 66.0 in | Wt 194.0 lb

## 2021-01-27 DIAGNOSIS — I480 Paroxysmal atrial fibrillation: Secondary | ICD-10-CM | POA: Diagnosis not present

## 2021-01-27 DIAGNOSIS — I2510 Atherosclerotic heart disease of native coronary artery without angina pectoris: Secondary | ICD-10-CM | POA: Diagnosis not present

## 2021-01-27 DIAGNOSIS — Z45010 Encounter for checking and testing of cardiac pacemaker pulse generator [battery]: Secondary | ICD-10-CM

## 2021-01-27 DIAGNOSIS — I10 Essential (primary) hypertension: Secondary | ICD-10-CM | POA: Diagnosis not present

## 2021-01-27 LAB — CUP PACEART INCLINIC DEVICE CHECK
Battery Remaining Longevity: 0 mo
Battery Voltage: 2.6 V
Brady Statistic RA Percent Paced: 98 %
Brady Statistic RV Percent Paced: 61 %
Date Time Interrogation Session: 20221229183154
Implantable Lead Implant Date: 20130425
Implantable Lead Implant Date: 20130425
Implantable Lead Location: 753859
Implantable Lead Location: 753860
Implantable Pulse Generator Implant Date: 20130425
Lead Channel Impedance Value: 387.5 Ohm
Lead Channel Impedance Value: 425 Ohm
Lead Channel Pacing Threshold Amplitude: 0.625 V
Lead Channel Pacing Threshold Amplitude: 1 V
Lead Channel Pacing Threshold Pulse Width: 0.4 ms
Lead Channel Pacing Threshold Pulse Width: 0.4 ms
Lead Channel Sensing Intrinsic Amplitude: 2 mV
Lead Channel Sensing Intrinsic Amplitude: 3.9 mV
Lead Channel Setting Pacing Amplitude: 1.25 V
Lead Channel Setting Pacing Amplitude: 1.625
Lead Channel Setting Pacing Pulse Width: 0.4 ms
Lead Channel Setting Sensing Sensitivity: 0.7 mV
Pulse Gen Model: 2110
Pulse Gen Serial Number: 7322329

## 2021-01-27 NOTE — Patient Instructions (Signed)
Medication Instructions:   Your physician recommends that you continue on your current medications as directed. Please refer to the Current Medication list given to you today.   *If you need a refill on your cardiac medications before your next appointment, please call your pharmacy*   Lab Work: BMET AND CBC TODAY    If you have labs (blood work) drawn today and your tests are completely normal, you will receive your results only by: [place] (if you have MyChart) OR A paper copy in the mail If you have any lab test that is abnormal or we need to change your treatment, we will call you to review the results.   Testing/Procedures:  SEE LETTER  FOR GEN CHANGE ON 02-23-21 WITH DR Caryl Comes      Follow-Up: At [facility], you and your health needs are our priority.  As part of our continuing mission to provide you with exceptional heart care, we have created designated Provider Care Teams.  These Care Teams include your primary Cardiologist (physician) and Advanced Practice Providers (APPs -  Physician Assistants and Nurse Practitioners) who all work together to provide you with the care you need, when you need it.  We recommend signing up for the patient portal called "MyChart".  Sign up information is provided on this After Visit Summary.  MyChart is used to connect with patients for Virtual Visits (Telemedicine).  Patients are able to view lab/test results, encounter notes, upcoming appointments, etc.  Non-urgent messages can be sent to your provider as well.   To learn more about what you can do with MyChart, go to [email or web address].    Your next appointment:  AFTER 02-23-21 10-14 DAY WOUND CHECK VISIT    AND   3 month(s)  The format for your next appointment:   In Person  Provider:   Virl Axe, MD    Other Instructions Pacemaker Battery Change A pacemaker battery usually lasts 5-15 years (6-7 years on average). A few times a year, you may be asked to visit  your health care provider to have a full evaluation of your pacemaker. When the battery is low, your pacemaker will be completely replaced. Most often, this procedure is simpler than the first surgery because the wires (leads) that connect the pacemaker to the heart are already in place. There are many things that affect how long a pacemaker battery will last, including: The age of the pacemaker. The number of leads you have(1, 2, or 3). The use or workload of the pacemaker. If the pacemaker is helping the heart more often, the battery will not last as long. Power (voltage) settings. Tell a health care provider about: Any allergies you have. All medicines you are taking, including vitamins, herbs, eye drops, creams, and over-the-counter medicines. Any problems you or family members have had with anesthetic medicines. Any blood disorders you have. Any surgeries you have had, especially any surgeries you have had since your last pacemaker was placed. Any medical conditions you have. Whether you are pregnant or may be pregnant. What are the risks? Generally, this is a safe procedure. However, problems may occur, including: Bleeding. Infection. Nerve damage. Allergic reaction to medicines. Damage to the leads that go to the heart. What happens before the procedure? Staying hydrated Follow instructions from your health care provider about hydration, which may include: Up to 2 hours before the procedure - you may continue to drink clear liquids, such as water, clear fruit juice, black coffee, and plain tea. Eating and  drinking restrictions Follow instructions from your health care provider about eating and drinking restrictions, which may include: 8 hours before the procedure - stop eating heavy meals or foods, such as meat, fried foods, or fatty foods. 6 hours before the procedure - stop eating light meals or foods, such as toast or cereal. 6 hours before the procedure - stop drinking milk or  drinks that contain milk. 2 hours before the procedure - stop drinking clear liquids. Medicines Ask your health care provider about: Changing or stopping your regular medicines. This is especially important if you are taking diabetes medicines or blood thinners. Taking medicines such as aspirin and ibuprofen. These medicines can thin your blood. Do not take these medicines unless your health care provider tells you to take them. Taking over-the-counter medicines, vitamins, herbs, and supplements. General instructions Ask your health care provider what steps will be taken to help prevent infection. These may include: Removing hair at the surgery site. Washing skin with a germ-killing soap. Receiving antibiotic medicine. Plan to have someone take you home from the hospital or clinic. If you will be going home right after the procedure, plan to have someone with you for 24 hours. What happens during the procedure? An IV will be inserted into one of your veins. You will be given one or more of the following: A medicine to help you relax (sedative). A medicine to numb the area where the pacemaker is located (local anesthetic). Your health care provider will make an incision to reopen the pocket holding the pacemaker. The old pacemaker will be disconnected from the leads. The leads will be tested. If needed, the leads will be replaced. If the leads are functioning properly, the new pacemaker will be connected to the existing leads. A heart monitor and a pacemaker programmer will be used to make sure that the newly implanted pacemaker is working properly. The incision site will be closed with stitches (sutures), adhesive strips, or skin glue. A bandage (dressing) will be placed over the pacemaker site. The procedure may vary among health care providers and hospitals. What happens after the procedure? Your blood pressure, heart rate, breathing rate, and blood oxygen level will be monitored until  you leave the hospital or clinic. You may be given antibiotics. Your health care provider will tell you when your pacemaker will need to be tested again, or when to return to the office for removal of the dressing and sutures. If you were given a sedative during the procedure, it can affect you for several hours. Do not drive or operate machinery until your health care provider says that it is safe. You will be given a pacemaker identification card. This card lists the implant date, device model, and manufacturer of your pacemaker. Summary A pacemaker battery usually lasts 5-15 years (6-7 years on average). When the battery is low, your pacemaker will need to be replaced. Most often, this procedure is simpler than the first surgery because the wires (leads) that connect the pacemaker to the heart are already in place. Risks of this procedure include bleeding, infection, and allergic reactions to medicines. This information is not intended to replace advice given to you by your health care provider. Make sure you discuss any questions you have with your health care provider. Document Revised: 12/19/2018 Document Reviewed: 12/19/2018 Elsevier Patient Education  2022 [employer].

## 2021-01-28 LAB — CBC
Hematocrit: 38.4 % (ref 37.5–51.0)
Hemoglobin: 13.6 g/dL (ref 13.0–17.7)
MCH: 35.2 pg — ABNORMAL HIGH (ref 26.6–33.0)
MCHC: 35.4 g/dL (ref 31.5–35.7)
MCV: 100 fL — ABNORMAL HIGH (ref 79–97)
Platelets: 186 10*3/uL (ref 150–450)
RBC: 3.86 x10E6/uL — ABNORMAL LOW (ref 4.14–5.80)
RDW: 13.2 % (ref 11.6–15.4)
WBC: 6 10*3/uL (ref 3.4–10.8)

## 2021-01-28 LAB — BASIC METABOLIC PANEL
BUN/Creatinine Ratio: 19 (ref 10–24)
BUN: 22 mg/dL (ref 8–27)
CO2: 24 mmol/L (ref 20–29)
Calcium: 8.5 mg/dL — ABNORMAL LOW (ref 8.6–10.2)
Chloride: 101 mmol/L (ref 96–106)
Creatinine, Ser: 1.16 mg/dL (ref 0.76–1.27)
Glucose: 80 mg/dL (ref 70–99)
Potassium: 5 mmol/L (ref 3.5–5.2)
Sodium: 137 mmol/L (ref 134–144)
eGFR: 64 mL/min/{1.73_m2} (ref >59)

## 2021-02-02 NOTE — Progress Notes (Signed)
Remote pacemaker transmission.   

## 2021-02-04 ENCOUNTER — Telehealth: Payer: Self-pay

## 2021-02-04 NOTE — Telephone Encounter (Signed)
Spoke with pt and advised pt's procedure arrival time for generator change on 02/23/2021 has been changed to 1pm.  All other instructions remain the same.  Pt verbalizes understanding and agrees with current plan.

## 2021-02-23 ENCOUNTER — Ambulatory Visit (HOSPITAL_COMMUNITY)
Admission: RE | Admit: 2021-02-23 | Discharge: 2021-02-23 | Disposition: A | Discharge status: Home / Self Care | Payer: Medicare HMO | Source: Ambulatory Visit | Attending: Internal Medicine | Admitting: Internal Medicine

## 2021-02-23 ENCOUNTER — Ambulatory Visit (HOSPITAL_COMMUNITY)
Admission: RE | Disposition: A | Discharge status: Home / Self Care | Payer: Self-pay | Source: Ambulatory Visit | Attending: Internal Medicine

## 2021-02-23 ENCOUNTER — Other Ambulatory Visit: Payer: Self-pay

## 2021-02-23 DIAGNOSIS — I480 Paroxysmal atrial fibrillation: Secondary | ICD-10-CM | POA: Insufficient documentation

## 2021-02-23 DIAGNOSIS — E669 Obesity, unspecified: Secondary | ICD-10-CM | POA: Diagnosis not present

## 2021-02-23 DIAGNOSIS — I495 Sick sinus syndrome: Secondary | ICD-10-CM | POA: Diagnosis not present

## 2021-02-23 DIAGNOSIS — Z87891 Personal history of nicotine dependence: Secondary | ICD-10-CM | POA: Insufficient documentation

## 2021-02-23 DIAGNOSIS — E785 Hyperlipidemia, unspecified: Secondary | ICD-10-CM | POA: Insufficient documentation

## 2021-02-23 DIAGNOSIS — Z45010 Encounter for checking and testing of cardiac pacemaker pulse generator [battery]: Secondary | ICD-10-CM

## 2021-02-23 DIAGNOSIS — I10 Essential (primary) hypertension: Secondary | ICD-10-CM | POA: Diagnosis not present

## 2021-02-23 DIAGNOSIS — Z7901 Long term (current) use of anticoagulants: Secondary | ICD-10-CM | POA: Diagnosis not present

## 2021-02-23 DIAGNOSIS — I2510 Atherosclerotic heart disease of native coronary artery without angina pectoris: Secondary | ICD-10-CM | POA: Insufficient documentation

## 2021-02-23 DIAGNOSIS — Z6831 Body mass index (BMI) 31.0-31.9, adult: Secondary | ICD-10-CM | POA: Insufficient documentation

## 2021-02-23 DIAGNOSIS — Z951 Presence of aortocoronary bypass graft: Secondary | ICD-10-CM | POA: Diagnosis not present

## 2021-02-23 HISTORY — PX: PPM GENERATOR CHANGEOUT: EP1233

## 2021-02-23 SURGERY — PPM GENERATOR CHANGEOUT

## 2021-02-23 MED ORDER — SODIUM CHLORIDE 0.9 % IV SOLN
80.0000 mg | INTRAVENOUS | Status: AC
  Administered 2021-02-23: 15:00:00 80 mg

## 2021-02-23 MED ORDER — LIDOCAINE HCL 1 % IJ SOLN
INTRAMUSCULAR | Status: AC
  Filled 2021-02-23: qty 20

## 2021-02-23 MED ORDER — LIDOCAINE HCL (PF) 1 % IJ SOLN
INTRAMUSCULAR | Status: DC | PRN
  Administered 2021-02-23: 15:00:00 60 mL

## 2021-02-23 MED ORDER — SODIUM CHLORIDE 0.9 % IV SOLN: INTRAVENOUS | Status: DC

## 2021-02-23 MED ORDER — SODIUM CHLORIDE 0.9 % IV SOLN
INTRAVENOUS | Status: AC
  Filled 2021-02-23: qty 2

## 2021-02-23 MED ORDER — MIDAZOLAM HCL 5 MG/5ML IJ SOLN
INTRAMUSCULAR | Status: AC
  Filled 2021-02-23: qty 5

## 2021-02-23 MED ORDER — FENTANYL CITRATE (PF) 100 MCG/2ML IJ SOLN
INTRAMUSCULAR | Status: DC | PRN
  Administered 2021-02-23: 25 ug via INTRAVENOUS
  Administered 2021-02-23: 50 ug via INTRAVENOUS

## 2021-02-23 MED ORDER — ACETAMINOPHEN 325 MG PO TABS: 325.0000 mg | ORAL_TABLET | ORAL | Status: DC | PRN

## 2021-02-23 MED ORDER — CHLORHEXIDINE GLUCONATE 4 % EX LIQD
4.0000 | Freq: Once | CUTANEOUS | Status: DC
Start: 2021-02-23 — End: 2021-02-23

## 2021-02-23 MED ORDER — CEFAZOLIN SODIUM-DEXTROSE 2-4 GM/100ML-% IV SOLN
2.0000 g | INTRAVENOUS | Status: AC
  Administered 2021-02-23: 15:00:00 2 g via INTRAVENOUS

## 2021-02-23 MED ORDER — MIDAZOLAM HCL 5 MG/5ML IJ SOLN
INTRAMUSCULAR | Status: DC | PRN
  Administered 2021-02-23: 2 mg via INTRAVENOUS
  Administered 2021-02-23: 1 mg via INTRAVENOUS

## 2021-02-23 MED ORDER — FENTANYL CITRATE (PF) 100 MCG/2ML IJ SOLN
INTRAMUSCULAR | Status: AC
  Filled 2021-02-23: qty 2

## 2021-02-23 MED ORDER — CEFAZOLIN SODIUM-DEXTROSE 2-4 GM/100ML-% IV SOLN
INTRAVENOUS | Status: AC
  Filled 2021-02-23: qty 100

## 2021-02-23 SURGICAL SUPPLY — 4 items
CABLE SURGICAL S-101-97-12 (CABLE) ×2 IMPLANT
PACEMAKER ASSURITY DR-RF (Pacemaker) ×1 IMPLANT
PAD DEFIB RADIO PHYSIO CONN (PAD) ×2 IMPLANT
TRAY PACEMAKER INSERTION (PACKS) ×2 IMPLANT

## 2021-02-23 NOTE — Discharge Instructions (Signed)
PPM Battery Change, Care After  This sheet gives you information about how to care for yourself after your procedure. Your health care provider may also give you more specific instructions. If you have problems or questions, contact your health care provider. What can I expect after the procedure? After your procedure, it is common to have: Pain or soreness at the site where the cardiac device was inserted. Swelling at the site where the cardiac device was inserted. You should received an information card for your new device in 4-8 weeks. Follow these instructions at home: Incision care  Keep the incision clean and dry. Do not take baths, swim, or use a hot tub until after your wound check.  Do not shower for at least 7 days, or as directed by your health care provider. Pat the area dry with a clean towel. Do not rub the area. This may cause bleeding. Follow instructions from your health care provider about how to take care of your incision. Make sure you: Leave stitches (sutures), skin glue, or adhesive strips in place. These skin closures may need to stay in place for 2 weeks or longer. If adhesive strip edges start to loosen and curl up, you may trim the loose edges. Do not remove adhesive strips completely unless your health care provider tells you to do that. Check your incision area every day for signs of infection. Check for: More redness, swelling, or pain. More fluid or blood. Warmth. Pus or a bad smell. Activity Do not lift anything that is heavier than 10 lb (4.5 kg) until your health care provider says it is okay to do so. For the first week, or as long as told by your health care provider: Avoid lifting your affected arm higher than your shoulder. After 1 week, Be gentle when you move your arms over your head. It is okay to raise your arm to comb your hair. Avoid strenuous exercise. Ask your health care provider when it is okay to: Resume your normal activities. Return to  work or school. Resume sexual activity. Eating and drinking Eat a heart-healthy diet. This should include plenty of fresh fruits and vegetables, whole grains, low-fat dairy products, and lean protein like chicken and fish. Limit alcohol intake to no more than 1 drink a day for non-pregnant women and 2 drinks a day for men. One drink equals 12 oz of beer, 5 oz of wine, or 1 oz of hard liquor. Check ingredients and nutrition facts on packaged foods and beverages. Avoid the following types of food: Food that is high in salt (sodium). Food that is high in saturated fat, like full-fat dairy or red meat. Food that is high in trans fat, like fried food. Food and drinks that are high in sugar. Lifestyle Do not use any products that contain nicotine or tobacco, such as cigarettes and e-cigarettes. If you need help quitting, ask your health care provider. Take steps to manage and control your weight. Once cleared, get regular exercise. Aim for 150 minutes of moderate-intensity exercise (such as walking or yoga) or 75 minutes of vigorous exercise (such as running or swimming) each week. Manage other health problems, such as diabetes or high blood pressure. Ask your health care provider how you can manage these conditions. General instructions Do not drive for 24 hours after your procedure if you were given a medicine to help you relax (sedative). Take over-the-counter and prescription medicines only as told by your health care provider. Avoid putting pressure on the   area where the cardiac device was placed. If you need an MRI after your cardiac device has been placed, be sure to tell the health care provider who orders the MRI that you have a cardiac device. Avoid close and prolonged exposure to electrical devices that have strong magnetic fields. These include: Cell phones. Avoid keeping them in a pocket near the cardiac device, and try using the ear opposite the cardiac device. MP3 players. Household  appliances, like microwaves. Metal detectors. Electric generators. High-tension wires. Keep all follow-up visits as directed by your health care provider. This is important. Contact a health care provider if: You have pain at the incision site that is not relieved by over-the-counter or prescription medicines. You have any of these around your incision site or coming from it: More redness, swelling, or pain. Fluid or blood. Warmth to the touch. Pus or a bad smell. You have a fever. You feel brief, occasional palpitations, light-headedness, or any symptoms that you think might be related to your heart. Get help right away if: You experience chest pain that is different from the pain at the cardiac device site. You develop a red streak that extends above or below the incision site. You experience shortness of breath. You have palpitations or an irregular heartbeat. You have light-headedness that does not go away quickly. You faint or have dizzy spells. Your pulse suddenly drops or increases rapidly and does not return to normal. You begin to gain weight and your legs and ankles swell. Summary After your procedure, it is common to have pain, soreness, and some swelling where the cardiac device was inserted. Make sure to keep your incision clean and dry. Follow instructions from your health care provider about how to take care of your incision. Check your incision every day for signs of infection, such as more pain or swelling, pus or a bad smell, warmth, or leaking fluid and blood. Avoid strenuous exercise and lifting your left arm higher than your shoulder for 2 weeks, or as long as told by your health care provider. This information is not intended to replace advice given to you by your health care provider. Make sure you discuss any questions you have with your health care provider.  

## 2021-02-23 NOTE — Interval H&P Note (Signed)
History and Physical Interval Note:  02/23/2021 12:08 PM  Dylan Roth  has presented today for surgery, with the diagnosis of ERI.  The various methods of treatment have been discussed with the patient and family. After consideration of risks, benefits and other options for treatment, the patient has consented to  Procedure(s): PPM GENERATOR CHANGEOUT (N/A) as a surgical intervention.  The patient's history has been reviewed, patient examined, no change in status, stable for surgery.  I have reviewed the patient's chart and labs.  Questions were answered to the patient's satisfaction.     Virl Axe

## 2021-02-24 ENCOUNTER — Encounter (HOSPITAL_COMMUNITY): Payer: Self-pay | Admitting: Internal Medicine

## 2021-02-24 MED FILL — Lidocaine HCl Local Inj 1%: INTRAMUSCULAR | Qty: 60 | Status: AC

## 2021-03-09 ENCOUNTER — Ambulatory Visit: Payer: Medicare HMO

## 2021-03-09 ENCOUNTER — Other Ambulatory Visit: Payer: Self-pay

## 2021-03-09 DIAGNOSIS — I495 Sick sinus syndrome: Secondary | ICD-10-CM

## 2021-03-09 NOTE — Patient Instructions (Addendum)
° °  After Your Pacemaker    Monitor your pacemaker site for redness, swelling, and drainage. Call the device clinic at [phone] if you experience these symptoms or fever/chills.  Please use warm compresses to your incision 3-4 times a day, gently rubbing to soften and dissolve scabbed area.  Do not put any powders, lotions, ointments, or dressing covers on the incision.   You may drive, unless driving has been restricted by your healthcare providers.  Your Pacemaker is MRI compatible.  Remote monitoring is used to monitor your pacemaker from home. This monitoring is scheduled every 91 days by our office. It allows [place] to keep an eye on the functioning of your device to ensure it is working properly. You will routinely see your Electrophysiologist annually (more often if necessary).

## 2021-03-10 LAB — CUP PACEART INCLINIC DEVICE CHECK
Battery Remaining Longevity: 112 mo
Battery Voltage: 3.1 V
Brady Statistic RA Percent Paced: 97 %
Brady Statistic RV Percent Paced: 90 %
Date Time Interrogation Session: 20230208145700
Implantable Lead Implant Date: 20130425
Implantable Lead Implant Date: 20130425
Implantable Lead Location: 753859
Implantable Lead Location: 753860
Implantable Pulse Generator Implant Date: 20230125
Lead Channel Impedance Value: 387.5 Ohm
Lead Channel Impedance Value: 425 Ohm
Lead Channel Pacing Threshold Amplitude: 0.625 V
Lead Channel Pacing Threshold Amplitude: 0.875 V
Lead Channel Pacing Threshold Pulse Width: 0.4 ms
Lead Channel Pacing Threshold Pulse Width: 0.4 ms
Lead Channel Setting Pacing Amplitude: 1.125
Lead Channel Setting Pacing Amplitude: 1.625
Lead Channel Setting Pacing Pulse Width: 0.4 ms
Lead Channel Setting Sensing Sensitivity: 0.5 mV
Pulse Gen Model: 2272
Pulse Gen Serial Number: 3997722

## 2021-03-10 NOTE — Progress Notes (Signed)
Wound check appointment s/p PPM gen change 02/23/21. Dermabond has been removed with showering. Moderate scabbing noted to incision (see pic in epic). Discussed with Dr. Caryl Comes who ordered warm compresses 3-4 times daily with gentle rubbing to soften scab and patient to return 03/14/21 for reassessment. Normal device function. Thresholds, sensing, and impedances consistent with implant measurements. Thresholds programmed for chronic lead safety margins. Histogram distribution appropriate for patient and level of activity. No high ventricular rates noted. +AMS with longest AT/AF episode 2 hours, 42 seconds. Burden 1.9%. +OAC. Known history. Patient enrolled in remote monitoring with next transmission scheduled 05/25/21. 91 day follow up with Dr. Caryl Comes 06/13/21.  Patient educated about wound care, arm mobility, lifting restrictions.

## 2021-03-14 ENCOUNTER — Ambulatory Visit: Payer: Medicare HMO

## 2021-03-14 ENCOUNTER — Other Ambulatory Visit: Payer: Self-pay

## 2021-03-14 DIAGNOSIS — Z950 Presence of cardiac pacemaker: Secondary | ICD-10-CM

## 2021-03-14 NOTE — Progress Notes (Signed)
Wound re-check, see patient instructions for SK recommendations. ROV 03/21/21 with SK in office.

## 2021-03-14 NOTE — Patient Instructions (Signed)
Wash and massage incision site with clean wash clothe warm soapy water 4 times daily, using new clean cloth each time and  wear clean shirts everyday and clean shirt to bed if applicable, if incision site starts to bleed apply pressure for 5 minutes with clean gauze and apply guaze and tape to incision site.

## 2021-03-21 ENCOUNTER — Ambulatory Visit (INDEPENDENT_AMBULATORY_CARE_PROVIDER_SITE_OTHER): Payer: Medicare HMO

## 2021-03-21 ENCOUNTER — Other Ambulatory Visit: Payer: Self-pay

## 2021-03-21 DIAGNOSIS — I495 Sick sinus syndrome: Secondary | ICD-10-CM

## 2021-03-21 NOTE — Progress Notes (Signed)
Patient in for wound recheck.  Dr. Caryl Comes in for assessment.  Patient to continue with same care (see AVS instructions) return in 1 week for recheck    Picture taken after bacitracin applied

## 2021-03-21 NOTE — Patient Instructions (Signed)
Wash and massage incision site with clean wash cloth warm soapy water 4 times daily, using new clean cloth each time and  wear clean shirts everyday and clean shirt to bed if applicable, if incision site starts to bleed apply pressure for 5 minutes with clean gauze and apply guaze and tape to incision site.

## 2021-03-29 ENCOUNTER — Other Ambulatory Visit: Payer: Self-pay

## 2021-03-29 ENCOUNTER — Ambulatory Visit (INDEPENDENT_AMBULATORY_CARE_PROVIDER_SITE_OTHER): Payer: Medicare HMO

## 2021-03-29 DIAGNOSIS — I495 Sick sinus syndrome: Secondary | ICD-10-CM

## 2021-03-29 NOTE — Progress Notes (Signed)
Scheduled wound check appointment. Wound healing well without s/s of infection. Patient is advised to continue treatment as outlined by Dr. Caryl Comes. Will review with Dr. Caryl Comes and update patient with additional appointments or recommendations if needed. Patient is advised to call device clinic at [phone] if wound should worsen. Patient agreeable.

## 2021-03-29 NOTE — Patient Instructions (Signed)
Please continue with current treatment as outlined at last appointment. Will discuss with Dr. Caryl Comes and you will be notified if additional appointments are needed. Please call device clinic at [phone] with any changes in wound status.

## 2021-04-11 ENCOUNTER — Telehealth: Payer: Self-pay

## 2021-04-11 NOTE — Telephone Encounter (Signed)
I have tried to call patient to setup an office visit for patient to discuss a recall colonoscopy. Patients voicemail has not been set up.  ?

## 2021-04-25 ENCOUNTER — Encounter: Payer: Self-pay | Admitting: Gastroenterology

## 2021-04-28 ENCOUNTER — Other Ambulatory Visit: Payer: Self-pay | Admitting: Cardiovascular Disease

## 2021-05-02 ENCOUNTER — Encounter: Payer: Medicare HMO | Admitting: Internal Medicine

## 2021-05-25 ENCOUNTER — Ambulatory Visit (INDEPENDENT_AMBULATORY_CARE_PROVIDER_SITE_OTHER): Payer: Medicare HMO

## 2021-05-25 DIAGNOSIS — I495 Sick sinus syndrome: Secondary | ICD-10-CM | POA: Diagnosis not present

## 2021-05-26 LAB — CUP PACEART REMOTE DEVICE CHECK
Battery Remaining Longevity: 118 mo
Battery Remaining Percentage: 95.5 %
Battery Voltage: 3.02 V
Brady Statistic AP VP Percent: 84 %
Brady Statistic AP VS Percent: 15 %
Brady Statistic AS VP Percent: 1.5 %
Brady Statistic AS VS Percent: 1 %
Brady Statistic RA Percent Paced: 97 %
Brady Statistic RV Percent Paced: 85 %
Date Time Interrogation Session: 20230427092329
Implantable Lead Implant Date: 20130425
Implantable Lead Implant Date: 20130425
Implantable Lead Location: 753859
Implantable Lead Location: 753860
Implantable Pulse Generator Implant Date: 20230125
Lead Channel Impedance Value: 380 Ohm
Lead Channel Impedance Value: 440 Ohm
Lead Channel Pacing Threshold Amplitude: 0.625 V
Lead Channel Pacing Threshold Amplitude: 0.875 V
Lead Channel Pacing Threshold Pulse Width: 0.4 ms
Lead Channel Pacing Threshold Pulse Width: 0.4 ms
Lead Channel Sensing Intrinsic Amplitude: 1.9 mV
Lead Channel Sensing Intrinsic Amplitude: 4.1 mV
Lead Channel Setting Pacing Amplitude: 1.125
Lead Channel Setting Pacing Amplitude: 1.625
Lead Channel Setting Pacing Pulse Width: 0.4 ms
Lead Channel Setting Sensing Sensitivity: 0.5 mV
Pulse Gen Model: 2272
Pulse Gen Serial Number: 3997722

## 2021-06-09 NOTE — Progress Notes (Signed)
Remote pacemaker transmission.   

## 2021-06-13 ENCOUNTER — Encounter: Payer: Medicare HMO | Admitting: Internal Medicine

## 2021-06-20 ENCOUNTER — Other Ambulatory Visit: Payer: Self-pay | Admitting: Cardiovascular Disease

## 2021-06-22 ENCOUNTER — Other Ambulatory Visit: Payer: Self-pay | Admitting: Cardiovascular Disease

## 2021-06-25 ENCOUNTER — Other Ambulatory Visit: Payer: Self-pay | Admitting: Cardiovascular Disease

## 2021-07-06 ENCOUNTER — Ambulatory Visit (INDEPENDENT_AMBULATORY_CARE_PROVIDER_SITE_OTHER): Payer: Medicare HMO | Admitting: Internal Medicine

## 2021-07-06 ENCOUNTER — Encounter: Payer: Self-pay | Admitting: Internal Medicine

## 2021-07-06 VITALS — BP 98/66 | HR 61 | Ht 66.0 in | Wt 185.8 lb

## 2021-07-06 DIAGNOSIS — Z950 Presence of cardiac pacemaker: Secondary | ICD-10-CM | POA: Diagnosis not present

## 2021-07-06 DIAGNOSIS — I480 Paroxysmal atrial fibrillation: Secondary | ICD-10-CM

## 2021-07-06 DIAGNOSIS — I495 Sick sinus syndrome: Secondary | ICD-10-CM

## 2021-07-06 NOTE — Progress Notes (Signed)
Patient Care Team: Spry, Marsh Dolly., MD as PCP - General (Family Medicine) Burnell Blanks, MD as PCP - Cardiology (Cardiology) [name], MD as PCP - Electrophysiology (Cardiology)   HPI  Dylan Roth is a 81 y.o. male Seen in followup for atrial fibrillation with  posttermination pauses for which he underwent pacemaker implantation 4/13. He has been treated with sotalol and Rivaroxaban then on warfarin and now on apixaban  He has a history of ischemic heart disease with prior bypass surgery 2005.    DATE TEST EF   9/14 Echo  55-65 LVH 13/40m  8/17 LHC   % 3/5 grafts patent   1/20 Echo  65-70%   10/22 Echo  55% Hypertrophy 14/159m The patient denies chest pain, nocturnal dyspnea, orthopnea or peripheral edema.  There have been no palpitations, lightheadedness or syncope.  Complains of some dyspnea on exertion when climbing inclines.     Date Cr K LDL Mg Hgb  8/17  0.94 4.4  1.9 14.2   12/17       15.2  2/19 1.04 4.7  2.0   1/20 1.12 4.5   13.0  10/21 0.99 4.7 45  13.1  4/22 (CE) 1.06 4.7   12.2  12/22 1.16 5.0  1.8 (10/22) 13.6  5/23 1.14 4.5 49  13.7    Date Rwave  7/18 5.0-6.3  10/20 3.1-5.0  1/22 3.9-4.5  10/22 3.6  6/23 3.7      Past Medical History:  Diagnosis Date   Anemia    Angina    Arthritis    "in my back"   Atrial fibrillation/flutter    BPH (benign prostatic hyperplasia)    CAD (coronary artery disease) Dec 2005   Hx MI. 80% left main, 80% LAD, 90% ramus, 50% circ, 90% in nondominant RCA,  EF normal  2010--echo   Cataract    bilateral-removed 6-7 years ago   Central obesity    Chronic back pain    Dysrhythmia    Erectile dysfunction    GERD (gastroesophageal reflux disease)    pt. denies   Gout    "once; I have it under control w/medicine"   History of anemia    History of colon polyps    Hyperlipidemia    Hypertension    IBS (irritable bowel syndrome)    with diarrhea   Myocardial infarction ([place]   pt. denies  at preop   Pacemaker    Palpitations    Sinus node dysfunction ([place]   post termination pauses <5sec    Past Surgical History:  Procedure Laterality Date   CARDIAC CATHETERIZATION  2005   "that what sent me to CABG"   CARDIAC CATHETERIZATION N/A 09/22/2015   Procedure: Left Heart Cath and Cors/Grafts Angiography;  Surgeon: ChBurnell BlanksMD;  [facility];  Service: Cardiovascular;  Laterality: N/A;   CATARACT EXTRACTION W/ INTRAOCULAR LENS  IMPLANT, BILATERAL  ~ 01/2011   CHOLECYSTECTOMY  1978    COLONOSCOPY  2014   (Polypectomy) colonic polyps status post polypectomy. Pancolonic diverticulosis predominantly in the sigmoid colon. Small internal hemorrhoids. Patient also had perirectal excoriation   CORONARY ARTERY [place]PASS [place]RA[place]  [place]   [place], SVG to diagonal, SVG to ramus, SVG to OM and SVG to RCA    EYE SURGERY     retina surgey right eye in [place]   PACEMAKER INSERTION  2014   PERMANENT PACEMAKER INSERTION N/A 05/25/2011  Procedure: PERMANENT PACEMAKER INSERTION;  Surgeon: [name], MD;  Location: [facility] CATH LAB;  Service: Cardiovascular;  Laterality: N/A;   PPM GENERATOR CHANGEOUT N/A 02/23/2021   Procedure: PPM GENERATOR CHANGEOUT;  Surgeon: [name], MD;  Location: [facility];  Service: Cardiovascular;  Laterality: N/A;   TOTAL KNEE ARTHROPLASTY Right 12/16/2018   Procedure: TOTAL KNEE ARTHROPLASTY;  Surgeon: Vickey Huger, MD;  [facility]: [facility];  Service: Orthopedics;  Laterality: Right;  75 mins needed for length of case   TRANSURETHRAL RESECTION OF PROSTATE N/A 12/29/2013   Procedure: TRANSURETHRAL RESECTION OF THE PROSTATE WITH GYRUS INSTRUMENTS;  Surgeon: [name], MD;  Location: [facility];  Service: Urology;  Laterality: N/A;    Current Outpatient Medications  Medication Sig Dispense Refill   allopurinol (ZYLOPRIM) 300 MG tablet Take 300 mg by mouth daily.     apixaban (ELIQUIS) 5 MG TABS tablet Take 1  tablet (5 mg total) by mouth 2 (two) times daily. 180 tablet 1   atorvastatin (LIPITOR) 80 MG tablet Take 80 mg by mouth at bedtime.     carvedilol (COREG) 3.125 MG tablet TAKE 1 TABLET TWICE DAILY WITH MEALS 180 tablet 1   diclofenac Sodium (VOLTAREN) 1 % GEL Apply 1 application topically 2 (two) times daily as needed (pain).     diphenhydramine-acetaminophen (TYLENOL PM) 25-500 MG TABS tablet Take 2 tablets by mouth at bedtime.     escitalopram (LEXAPRO) 10 MG tablet Take 10 mg by mouth every evening.     isosorbide mononitrate (IMDUR) 30 MG 24 hr tablet TAKE 1 TABLET TWICE DAILY (Patient taking differently: 30 mg daily.) 180 tablet 2   methocarbamol (ROBAXIN) 500 MG tablet Take 1-2 tablets (500-1,000 mg total) by mouth every 6 (six) hours as needed for muscle spasms. (Patient taking differently: Take 500 mg by mouth in the morning and at bedtime.) 60 tablet 0   nitroGLYCERIN (NITROSTAT) 0.4 MG SL tablet Place 1 tablet (0.4 mg total) under the tongue every 5 (five) minutes as needed for chest pain. DO NOT EXCEED A TOTAL OF 3 DOSES IN 15 MINUTES 75 tablet 1   Omega-3 Fatty Acids (FISH OIL) 1000 MG CAPS Take 1,000 mg by mouth daily.     ranolazine (RANEXA) 500 MG 12 hr tablet TAKE 1 TABLET TWICE DAILY 180 tablet 1   sotalol (BETAPACE) 120 MG tablet Take 1 tablet (120 mg total) by mouth every 12 (twelve) hours. Please keep upcoming appointment for future refills. Thank you. 60 tablet 0   Turmeric 500 MG CAPS Take 500 mg by mouth daily.     No current facility-administered medications for this visit.    Allergies  Allergen Reactions   Sulfa Antibiotics Other (See Comments)    SWELLING ON PATIENTS LIPS, HE GETS DRIED UP   Sulfamethoxazole Other (See Comments)    Unknown   Sulfamethoxazole-Trimethoprim Other (See Comments)    Unknown    Review of Systems negative except from HPI and PMH  Physical Exam BP 98/66   Pulse 61   Ht '5\' 6"'$  (1.676 m)   Wt 185 lb 12.8 oz (84.3 kg)   SpO2 95%    BMI 29.99 kg/m  Well developed and well nourished in no acute distress HENT normal Neck supple with JVP-7 Clear Device pocket well healed; without hematoma or erythema.  There is no tethering  Regular rate and rhythm, no  gallop No  murmur Abd-soft with active BS No Clubbing cyanosis 1+ edema Skin-warm and dry  A & Oriented  Grossly normal sensory and motor function  ECG   AV pacing  Assessment and  Plan  Ischemic heart disease with prior bypass surgery   Exertional dyspnea-   Sinus node dysfunction and chronotropic incompetence  Pacemaker-[employer]    Low voltage R wave  High Risk Medication Surveillance-sotalol  AF paroxsymal  AV conduction 1 AVBlock   Dyspnea continues to be a little bit of issue.  Mostly with inclines.  We took him for a hall walk, increased his slope from 10--11 and maybe it was a bit better.  His heart rate went from 90-100 with exertion.  We will continue him on sotalol 120 mg twice daily and Eliquis 5 mg twice daily.  Surveillance laboratories are normal  No angina but perhaps some ischemia contributing to his dyspnea.  Continue ranolazine 500 twice daily and isosorbide.  Last LDL was at goal at 49  The patient's device was interrogated.  The information was reviewed.  .  RV amplitude is stable

## 2021-07-06 NOTE — Patient Instructions (Signed)
Medication Instructions:  Your physician recommends that you continue on your current medications as directed. Please refer to the Current Medication list given to you today.  *If you need a refill on your cardiac medications before your next appointment, please call your pharmacy*   Lab Work: None ordered.  If you have labs (blood work) drawn today and your tests are completely normal, you will receive your results only by: [facility] (if you have MyChart) OR A paper copy in the mail If you have any lab test that is abnormal or we need to change your treatment, we will call you to review the results.   Testing/Procedures: None ordered.    Follow-Up: At [facility], you and your health needs are our priority.  As part of our continuing mission to provide you with exceptional heart care, we have created designated Provider Care Teams.  These Care Teams include your primary Cardiologist (physician) and Advanced Practice Providers (APPs -  Physician Assistants and Nurse Practitioners) who all work together to provide you with the care you need, when you need it.  We recommend signing up for the patient portal called "MyChart".  Sign up information is provided on this After Visit Summary.  MyChart is used to connect with patients for Virtual Visits (Telemedicine).  Patients are able to view lab/test results, encounter notes, upcoming appointments, etc.  Non-urgent messages can be sent to your provider as well.   To learn more about what you can do with MyChart, go to [email or web address].    Your next appointment:   9 month(s)  The format for your next appointment:   In Person  Provider:   You will see one of the following Advanced Practice Providers on your designated Care Team:   Tommye Standard, [place] Legrand Como "Jonni Sanger" Chalmers Cater, PA-C :1}   Important Information About Sugar

## 2021-08-12 ENCOUNTER — Other Ambulatory Visit: Payer: Self-pay | Admitting: Cardiovascular Disease

## 2021-08-24 ENCOUNTER — Ambulatory Visit (INDEPENDENT_AMBULATORY_CARE_PROVIDER_SITE_OTHER): Payer: Medicare HMO

## 2021-08-24 DIAGNOSIS — I495 Sick sinus syndrome: Secondary | ICD-10-CM | POA: Diagnosis not present

## 2021-08-25 LAB — CUP PACEART REMOTE DEVICE CHECK
Battery Remaining Longevity: 113 mo
Battery Remaining Percentage: 95.5 %
Battery Voltage: 3.01 V
Brady Statistic AP VP Percent: 85 %
Brady Statistic AP VS Percent: 15 %
Brady Statistic AS VP Percent: 1 %
Brady Statistic AS VS Percent: 1 %
Brady Statistic RA Percent Paced: 99 %
Brady Statistic RV Percent Paced: 85 %
Date Time Interrogation Session: 20230727112433
Implantable Lead Implant Date: 20130425
Implantable Lead Implant Date: 20130425
Implantable Lead Location: 753859
Implantable Lead Location: 753860
Implantable Pulse Generator Implant Date: 20230125
Lead Channel Impedance Value: 380 Ohm
Lead Channel Impedance Value: 410 Ohm
Lead Channel Pacing Threshold Amplitude: 0.625 V
Lead Channel Pacing Threshold Amplitude: 0.875 V
Lead Channel Pacing Threshold Pulse Width: 0.4 ms
Lead Channel Pacing Threshold Pulse Width: 0.4 ms
Lead Channel Sensing Intrinsic Amplitude: 2.1 mV
Lead Channel Sensing Intrinsic Amplitude: 3.6 mV
Lead Channel Setting Pacing Amplitude: 1.125
Lead Channel Setting Pacing Amplitude: 1.625
Lead Channel Setting Pacing Pulse Width: 0.4 ms
Lead Channel Setting Sensing Sensitivity: 0.5 mV
Pulse Gen Model: 2272
Pulse Gen Serial Number: 3997722

## 2021-09-07 ENCOUNTER — Other Ambulatory Visit: Payer: Self-pay | Admitting: Cardiovascular Disease

## 2021-09-07 NOTE — Telephone Encounter (Signed)
Prescription refill request for Eliquis received. Indication: PAF Last office visit: 07/06/21  Olin Pia MD Scr: 1.14 on 06/22/21 Age: 81 Weight: 84.3kg  Based on above findings Eliquis '5mg'$  twice daily is the appropriate dose.  Refill approved.

## 2021-09-16 NOTE — Progress Notes (Signed)
Remote pacemaker transmission.   

## 2021-11-23 ENCOUNTER — Ambulatory Visit (INDEPENDENT_AMBULATORY_CARE_PROVIDER_SITE_OTHER): Payer: Medicare HMO

## 2021-11-23 DIAGNOSIS — I495 Sick sinus syndrome: Secondary | ICD-10-CM | POA: Diagnosis not present

## 2021-11-25 LAB — CUP PACEART REMOTE DEVICE CHECK
Battery Remaining Longevity: 112 mo
Battery Remaining Percentage: 95 %
Battery Voltage: 3.01 V
Brady Statistic AP VP Percent: 86 %
Brady Statistic AP VS Percent: 14 %
Brady Statistic AS VP Percent: 1 %
Brady Statistic AS VS Percent: 1 %
Brady Statistic RA Percent Paced: 99 %
Brady Statistic RV Percent Paced: 86 %
Date Time Interrogation Session: 20231026190727
Implantable Lead Connection Status: 753985
Implantable Lead Connection Status: 753985
Implantable Lead Implant Date: 20130425
Implantable Lead Implant Date: 20130425
Implantable Lead Location: 753859
Implantable Lead Location: 753860
Implantable Pulse Generator Implant Date: 20230125
Lead Channel Impedance Value: 410 Ohm
Lead Channel Impedance Value: 440 Ohm
Lead Channel Pacing Threshold Amplitude: 0.5 V
Lead Channel Pacing Threshold Amplitude: 0.875 V
Lead Channel Pacing Threshold Pulse Width: 0.4 ms
Lead Channel Pacing Threshold Pulse Width: 0.4 ms
Lead Channel Sensing Intrinsic Amplitude: 2 mV
Lead Channel Sensing Intrinsic Amplitude: 4.1 mV
Lead Channel Setting Pacing Amplitude: 1.125
Lead Channel Setting Pacing Amplitude: 1.5 V
Lead Channel Setting Pacing Pulse Width: 0.4 ms
Lead Channel Setting Sensing Sensitivity: 0.5 mV
Pulse Gen Model: 2272
Pulse Gen Serial Number: 3997722

## 2021-12-06 NOTE — Progress Notes (Signed)
Remote pacemaker transmission.   

## 2022-02-22 ENCOUNTER — Ambulatory Visit: Payer: Medicare HMO | Attending: Internal Medicine

## 2022-02-22 DIAGNOSIS — I495 Sick sinus syndrome: Secondary | ICD-10-CM

## 2022-02-24 LAB — CUP PACEART REMOTE DEVICE CHECK
Battery Remaining Longevity: 106 mo
Battery Remaining Percentage: 92 %
Battery Voltage: 3.01 V
Brady Statistic AP VP Percent: 85 %
Brady Statistic AP VS Percent: 15 %
Brady Statistic AS VP Percent: 1 %
Brady Statistic AS VS Percent: 1 %
Brady Statistic RA Percent Paced: 98 %
Brady Statistic RV Percent Paced: 85 %
Date Time Interrogation Session: 20240126051401
Implantable Lead Connection Status: 753985
Implantable Lead Connection Status: 753985
Implantable Lead Implant Date: 20130425
Implantable Lead Implant Date: 20130425
Implantable Lead Location: 753859
Implantable Lead Location: 753860
Implantable Pulse Generator Implant Date: 20230125
Lead Channel Impedance Value: 390 Ohm
Lead Channel Impedance Value: 390 Ohm
Lead Channel Pacing Threshold Amplitude: 0.5 V
Lead Channel Pacing Threshold Amplitude: 0.875 V
Lead Channel Pacing Threshold Pulse Width: 0.4 ms
Lead Channel Pacing Threshold Pulse Width: 0.4 ms
Lead Channel Sensing Intrinsic Amplitude: 2.5 mV
Lead Channel Sensing Intrinsic Amplitude: 4.5 mV
Lead Channel Setting Pacing Amplitude: 1.125
Lead Channel Setting Pacing Amplitude: 1.5 V
Lead Channel Setting Pacing Pulse Width: 0.4 ms
Lead Channel Setting Sensing Sensitivity: 0.5 mV
Pulse Gen Model: 2272
Pulse Gen Serial Number: 3997722

## 2022-03-15 NOTE — Progress Notes (Signed)
Remote pacemaker transmission.   

## 2022-04-10 ENCOUNTER — Encounter: Payer: Self-pay | Admitting: Internal Medicine

## 2022-04-10 ENCOUNTER — Encounter: Payer: Self-pay | Admitting: Cardiovascular Disease

## 2022-04-10 ENCOUNTER — Other Ambulatory Visit: Payer: Self-pay | Admitting: Internal Medicine

## 2022-04-10 ENCOUNTER — Ambulatory Visit: Payer: Medicare HMO | Attending: Internal Medicine | Admitting: Internal Medicine

## 2022-04-10 ENCOUNTER — Ambulatory Visit: Payer: Medicare HMO | Admitting: Cardiovascular Disease

## 2022-04-10 ENCOUNTER — Other Ambulatory Visit: Payer: Self-pay | Admitting: Cardiovascular Disease

## 2022-04-10 VITALS — BP 110/74 | HR 62 | Ht 66.0 in | Wt 189.6 lb

## 2022-04-10 VITALS — BP 110/74 | HR 62 | Ht 66.0 in | Wt 189.0 lb

## 2022-04-10 DIAGNOSIS — Z950 Presence of cardiac pacemaker: Secondary | ICD-10-CM

## 2022-04-10 DIAGNOSIS — I480 Paroxysmal atrial fibrillation: Secondary | ICD-10-CM

## 2022-04-10 DIAGNOSIS — I25119 Atherosclerotic heart disease of native coronary artery with unspecified angina pectoris: Secondary | ICD-10-CM | POA: Diagnosis not present

## 2022-04-10 DIAGNOSIS — I10 Essential (primary) hypertension: Secondary | ICD-10-CM

## 2022-04-10 DIAGNOSIS — I495 Sick sinus syndrome: Secondary | ICD-10-CM | POA: Diagnosis not present

## 2022-04-10 DIAGNOSIS — E7800 Pure hypercholesterolemia, unspecified: Secondary | ICD-10-CM

## 2022-04-10 NOTE — Progress Notes (Signed)
Chief Complaint  Patient presents with   Follow-up    CAD   History of Present Illness: 82 yo male with history of CAD s/p 5V CABG 2005, paroxysmal atrial fibrillation/flutter, HTN, HLD, sinus node dysfunction s/p pacemaker implantation who is here today for cardiac follow up. His atrial fibrillation/flutter and pacemaker has been followed by Dr. Caryl Comes. He saw Dr. Caryl Comes in April 2013 and was started on dofetilide for atrial fibrillation. He had post-termination pauses and had a pacemaker implanted in April 2013. He has since had Tikosyn stopped due to cost and was started on sotalol. He is on Eliquis.  I saw him in January 2017 and he c/o daily sharp chest pains. Imdur was added. I saw him in August 2017 and he continued to have chest pain several days per week. Cardiac cath 09/22/15 with 3 patent grafts but could not visualize the other two grafts. The RCA was occluded proximally with no graft to this vessel. We discussed a cardiac CTA but he refused due to claustrophobia. Ranexa added to his regimen in December 2017. Ad[facility] to [facility] in January 2020 with chest pain. Negative troponin. Echo January 2020 with LVEF=65-70%. Thickened aortic valve leaflets but no aortic stenosis. His chest pain was not felt to be cardiac related. Echo October 2022 with LVEF=55%, mild MR, mild to moderate AI. His pacemaker generator was changed in January 2023.   He is here today for follow up. The patient denies any chest pain, dyspnea, palpitations, lower extremity edema, orthopnea, PND, dizziness, near syncope or syncope.    Primary Care Physician: Verdell Carmine., MD  Past Medical History:  Diagnosis Date   Anemia    Angina    Arthritis    "in my back"   Atrial fibrillation/flutter    BPH (benign prostatic hyperplasia)    CAD (coronary artery disease) Dec 2005   Hx MI. 80% left main, 80% LAD, 90% ramus, 50% circ, 90% in nondominant RCA,  EF normal  2010--echo   Cataract    bilateral-removed 6-7 years ago    Central obesity    Chronic back pain    Dysrhythmia    Erectile dysfunction    GERD (gastroesophageal reflux disease)    pt. denies   Gout    "once; I have it under control w/medicine"   History of anemia    History of colon polyps    Hyperlipidemia    Hypertension    IBS (irritable bowel syndrome)    with diarrhea   Myocardial infarction ([place])    pt. denies at preop   Pacemaker    Palpitations    Sinus node dysfunction ([place])    post termination pauses <5sec    Past Surgical History:  Procedure Laterality Date   CARDIAC CATHETERIZATION  2005   "that what sent me to CABG"   CARDIAC CATHETERIZATION N/A 09/22/2015   Procedure: Left Heart Cath and Cors/Grafts Angiography;  Surgeon: Burnell Blanks, MD;  Location: [facility];  Service: Cardiovascular;  Laterality: N/A;   CATARACT EXTRACTION W/ INTRAOCULAR LENS  IMPLANT, BILATERAL  ~ 01/2011   CHOLECYSTECTOMY  1978    COLONOSCOPY  2014   (Polypectomy) colonic polyps status post polypectomy. Pancolonic diverticulosis predominantly in the sigmoid colon. Small internal hemorrhoids. Patient also had perirectal excoriation   CORONARY ARTERY BYPASS GRAFT  2005   in [place] - LIMA to LAD, SVG to diagonal, SVG to ramus, SVG to OM and SVG to RCA    EYE SURGERY  retina surgey right eye in [place]   PACEMAKER INSERTION  2014   PERMANENT PACEMAKER INSERTION N/A 05/25/2011   Procedure: PERMANENT PACEMAKER INSERTION;  Surgeon: [name], MD;  Location: [facility] CATH LAB;  Service: Cardiovascular;  Laterality: N/A;   PPM GENERATOR CHANGEOUT N/A 02/23/2021   Procedure: PPM GENERATOR CHANGEOUT;  Surgeon: [name], MD;  Location: [facility] [facility];  Service: Cardiovascular;  Laterality: N/A;   TOTAL KNEE ARTHROPLASTY Right 12/16/2018   Procedure: TOTAL KNEE ARTHROPLASTY;  Surgeon: Vickey Huger, MD;  [facility]: [facility];  Service: Orthopedics;  Laterality: Right;  75 mins needed for length of case   TRANSURETHRAL  RESECTION OF PROSTATE N/A 12/29/2013   Procedure: TRANSURETHRAL RESECTION OF THE PROSTATE WITH GYRUS INSTRUMENTS;  Surgeon: [name], MD;  Location: [facility];  Service: Urology;  Laterality: N/A;    Current Outpatient Medications  Medication Sig Dispense Refill   allopurinol (ZYLOPRIM) 300 MG tablet Take 300 mg by mouth daily.     atorvastatin (LIPITOR) 80 MG tablet Take 80 mg by mouth at bedtime.     carvedilol (COREG) 3.125 MG tablet TAKE 1 TABLET TWICE DAILY WITH MEALS 180 tablet 3   diclofenac Sodium (VOLTAREN) 1 % GEL Apply 1 application topically 2 (two) times daily as needed (pain).     diphenhydramine-acetaminophen (TYLENOL PM) 25-500 MG TABS tablet Take 2 tablets by mouth at bedtime.     ELIQUIS 5 MG TABS tablet TAKE 1 TABLET TWICE DAILY 180 tablet 1   escitalopram (LEXAPRO) 10 MG tablet Take 10 mg by mouth every evening.     isosorbide mononitrate (IMDUR) 30 MG 24 hr tablet TAKE 1 TABLET TWICE DAILY (Patient taking differently: 30 mg in the morning and at bedtime.) 180 tablet 2   methocarbamol (ROBAXIN) 500 MG tablet Take 1-2 tablets (500-1,000 mg total) by mouth every 6 (six) hours as needed for muscle spasms. (Patient taking differently: Take 500 mg by mouth in the morning and at bedtime.) 60 tablet 0   nitroGLYCERIN (NITROSTAT) 0.4 MG SL tablet Place 1 tablet (0.4 mg total) under the tongue every 5 (five) minutes as needed for chest pain. DO NOT EXCEED A TOTAL OF 3 DOSES IN 15 MINUTES 75 tablet 1   Omega-3 Fatty Acids (FISH OIL) 1000 MG CAPS Take 1,000 mg by mouth daily.     ranolazine (RANEXA) 500 MG 12 hr tablet TAKE 1 TABLET TWICE DAILY 180 tablet 1   sotalol (BETAPACE) 120 MG tablet TAKE 1 TABLET EVERY 12 HOURS (PLEASE KEEP UPCOMING APPOINTMENT FOR FUTURE REFILLS. THANK YOU) 180 tablet 1   Turmeric 500 MG CAPS Take 500 mg by mouth daily.     No current facility-administered medications for this visit.    Allergies  Allergen Reactions   Sulfa Antibiotics Other (See  Comments)    SWELLING ON PATIENTS LIPS, HE GETS DRIED UP   Sulfamethoxazole Other (See Comments)    Unknown   Sulfamethoxazole-Trimethoprim Other (See Comments)    Unknown    Social History   Socioeconomic History   Marital status: Married    Spouse name: Not on file   Number of children: 2   Years of education: Not on file   Highest education level: Not on file  Occupational History   Occupation: retired-truck Education administrator: RETIRED  Tobacco Use   Smoking status: Former    Packs/day: 0.00    Years: 0.00    Total pack years: 0.00    Types: Cigarettes  Quit date: 01/30/1961    Years since quitting: 61.2   Smokeless tobacco: Never  Vaping Use   Vaping Use: Never used  Substance and Sexual Activity   Alcohol use: No    Comment: 2 drinks (bourbon) a night   Drug use: No   Sexual activity: Yes  Other Topics Concern   Not on file  Social History Narrative   Married. Pt works part-time for a [employer]. Quit tobacco 50 years ago..Both parents are deceased, neither one had cardiac issues and no siblings have cardiac issues.    Social Determinants of Health   Financial Resource Strain: Not on file  Food Insecurity: Not on file  Transportation Needs: Not on file  Physical Activity: Not on file  Stress: Not on file  Social Connections: Not on file  Intimate Partner Violence: Not on file    Family History  Problem Relation Age of Onset   Cancer Mother    Stroke Brother    Heart attack Neg Hx    Colon cancer Neg Hx    Colon polyps Neg Hx    Heart disease Neg Hx    Esophageal cancer Neg Hx    Rectal cancer Neg Hx    Stomach cancer Neg Hx     Review of Systems:  As stated in the HPI and otherwise negative.   BP 110/74   Pulse 62   Ht '5\' 6"'$  (1.676 m)   Wt 85.7 kg   SpO2 96%   BMI 30.51 kg/m   Physical Examination: General: Well developed, well nourished, NAD  HEENT: OP clear, mucus membranes moist  SKIN: warm, dry. No rashes. Neuro: No focal  deficits  Musculoskeletal: Muscle strength 5/5 all ext  Psychiatric: Mood and affect normal  Neck: No JVD, no carotid bruits, no thyromegaly, no lymphadenopathy.  Lungs:Clear bilaterally, no wheezes, rhonci, crackles Cardiovascular: Regular rate and rhythm. No murmurs, gallops or rubs. Abdomen:Soft. Bowel sounds present. Non-tender.  Extremities: No lower extremity edema. Pulses are 2 + in the bilateral DP/PT.  Echo 11/15/20:  1. Left ventricular ejection fraction, by estimation, is 55%. The left  ventricle has normal function. The left ventricle has no regional wall  motion abnormalities. There is moderate asymmetric left ventricular  hypertrophy of the basal-septal segment (14   mm). Left ventricular diastolic parameters are consistent with Grade I  diastolic dysfunction (impaired relaxation).   2. Right ventricular systolic function is normal. The right ventricular  size is normal.   3. Left atrial size was mildly dilated.   4. Right atrial size was mildly dilated.   5. The mitral valve is normal in structure. Mild mitral valve  regurgitation. No evidence of mitral stenosis.   6. The aortic valve was not well visualized for morphology. There is mild  calcification of the aortic valve. Aortic valve regurgitation is  mild-moderate. No aortic stenosis is present.   EKG:  EKG is  ordered today. The ekg ordered today demonstrates:  paced rhythm  Recent Labs: No results found for requested labs within last 365 days.   Lipid Panel Followed in primary care   Wt Readings from Last 3 Encounters:  04/10/22 85.7 kg  04/10/22 86 kg  07/06/21 84.3 kg    Assessment and Plan:   1. CAD s/p CABG with angina: Last cardiac cath in August 2017 but I could not find two of his grafts. He has refused coronary CTA to better define his graft anatomy. No chest pain suggestive of unstable angina. Continue  statin, beta blocker, Imdur and Ranexa. He has not been on an ASA since he is on Eliquis.   2.  Atrial fibrillation, paroxysmal/Sick sinus syndrome: Sinus exam today. He is followed in the EP clinic by Dr. Caryl Comes. Pacemaker in place . Will continue beta blocker, sotalol and Eliquis.    3. HTN: BP is well controlled. No changes  4. HLD: His lipids are followed in primary care.  Continue statin.      Labs/ tests ordered today include:  No orders of the defined types were placed in this encounter.  Disposition:   F/U with me in 12 months  Signed, Lauree Chandler, MD 04/10/2022 4:41 PM    [facility] [address], [place], [place]  [address] Phone: [phone]; Fax: [phone]

## 2022-04-10 NOTE — Patient Instructions (Signed)
Medication Instructions:  Your physician recommends that you continue on your current medications as directed. Please refer to the Current Medication list given to you today.  *If you need a refill on your cardiac medications before your next appointment, please call your pharmacy*   Lab Work: None ordered.  If you have labs (blood work) drawn today and your tests are completely normal, you will receive your results only by: MyChart Message (if you have MyChart) OR A paper copy in the mail If you have any lab test that is abnormal or we need to change your treatment, we will call you to review the results.   Testing/Procedures: None ordered.    Follow-Up: At [facility], you and your health needs are our priority.  As part of our continuing mission to provide you with exceptional heart care, we have created designated Provider Care Teams.  These Care Teams include your primary Cardiologist (physician) and Advanced Practice Providers (APPs -  Physician Assistants and Nurse Practitioners) who all work together to provide you with the care you need, when you need it.  We recommend signing up for the patient portal called "MyChart".  Sign up information is provided on this After Visit Summary.  MyChart is used to connect with patients for Virtual Visits (Telemedicine).  Patients are able to view lab/test results, encounter notes, upcoming appointments, etc.  Non-urgent messages can be sent to your provider as well.   To learn more about what you can do with MyChart, go to [email or web address].    Your next appointment:   12 months with Dr Klein 

## 2022-04-10 NOTE — Patient Instructions (Signed)
Medication Instructions:  Your physician recommends that you continue on your current medications as directed. Please refer to the Current Medication list given to you today. *If you need a refill on your cardiac medications before your next appointment, please call your pharmacy*   Lab Work: None If you have labs (blood work) drawn today and your tests are completely normal, you will receive your results only by: [place] (if you have MyChart) OR A paper copy in the mail If you have any lab test that is abnormal or we need to change your treatment, we will call you to review the results.   Testing/Procedures: None   Follow-Up: At [facility], you and your health needs are our priority.  As part of our continuing mission to provide you with exceptional heart care, we have created designated Provider Care Teams.  These Care Teams include your primary Cardiologist (physician) and Advanced Practice Providers (APPs -  Physician Assistants and Nurse Practitioners) who all work together to provide you with the care you need, when you need it.  We recommend signing up for the patient portal called "MyChart".  Sign up information is provided on this After Visit Summary.  MyChart is used to connect with patients for Virtual Visits (Telemedicine).  Patients are able to view lab/test results, encounter notes, upcoming appointments, etc.  Non-urgent messages can be sent to your provider as well.   To learn more about what you can do with MyChart, go to [email or web address].    Your next appointment:   6 month(s)  Provider:   Lauree Chandler, MD

## 2022-04-10 NOTE — Progress Notes (Signed)
Patient Care Team: Spry, Marsh Dolly., MD as PCP - General (Family Medicine) Burnell Blanks, MD as PCP - Cardiology (Cardiology) [name], MD as PCP - Electrophysiology (Cardiology)   HPI  Dylan Roth is a 82 y.o. male Seen in followup for atrial fibrillation with  posttermination pauses for which he underwent pacemaker implantation 4/13. He has been treated with sotalol and Rivaroxaban then on warfarin and now on apixaban  He has a history of ischemic heart disease with prior bypass surgery 2005.    DATE TEST EF   9/14 Echo  55-65 LVH 13/80m  8/17 LHC   % 3/5 grafts patent   1/20 Echo  65-70%   10/22 Echo  55% Hypertrophy 14/11m The patient denies chest pain, shortness of breath, nocturnal dyspnea, orthopnea or peripheral edema.  There have been no palpitations, lightheadedness or syncope.    His wife beverly passed away 6/07-06-24ery rapidly from gastric cancer.  It noted other since high school.  Holding up pretty well.  Daughters keep his refrigerator stocked    Date Cr K LDL Mg Hgb  8/17  0.94 4.4  1.9 14.2   12/17       15.2  2/19 1.04 4.7  2.0   1/20 1.12 4.5   13.0  10/21 0.99 4.7 45  13.1  4/22 (CE) 1.06 4.7   12.2  12/22 1.16 5.0  1.8 (10/22) 13.6  5/23 1.14 4.5 49  13.7  11/23 1.05 4.7        Date Rwave  7/18 5.0-6.3  10/20 3.1-5.0  1/22 3.9-4.5  10/22 3.6  6/07-06-24.7      Past Medical History:  Diagnosis Date   Anemia    Angina    Arthritis    "in my back"   Atrial fibrillation/flutter    BPH (benign prostatic hyperplasia)    CAD (coronary artery disease) Dec 2005   Hx MI. 80% left main, 80% LAD, 90% ramus, 50% circ, 90% in nondominant RCA,  EF normal  2010--echo   Cataract    bilateral-removed 6-7 years ago   Central obesity    Chronic back pain    Dysrhythmia    Erectile dysfunction    GERD (gastroesophageal reflux disease)    pt. denies   Gout    "once; I have it under control w/medicine"   History of anemia    History  of colon polyps    Hyperlipidemia    Hypertension    IBS (irritable bowel syndrome)    with diarrhea   [place] [place]   [place]. denies at [place]   Pacemaker    Palpitations    S[place] node dysfunction ([facility]   post termination pauses <5sec    Past Surgical History:  Procedure Laterality Date   CARDIAC CATHETERIZATION  2005   "that what sent me to CABG"   CARDIAC CATHETERIZATION N/A 09/22/2015   Procedure: Left Heart Cath and Cors/Grafts Angiography;  Surgeon: ChBurnell BlanksMD;  [facility];  Service: Cardiovascular;  Laterality: N/A;   CATARACT EXTRACTION W/ INTRAOCULAR LENS  IMPLANT, BILATERAL  ~ 01/2011   CHOLECYSTECTOMY  1978    COLONOSCOPY  2014   (Polypectomy) colonic polyps status post polypectomy. Pancolonic diverticulosis predominantly in the sigmoid colon. Small internal hemorrhoids. Patient also had perirectal excoriation   CORONARY ARTERY [place]PASS GRA[place]  [place], SVG to diagonal, SVG to ramus, SVG to OM  and SVG to RCA    EYE SURGERY     retina surgey right eye in [place]   PACEMAKER INSERTION  2014   PERMANENT PACEMAKER INSERTION N/A 05/25/2011   Procedure: PERMANENT PACEMAKER INSERTION;  Surgeon: [name], MD;  Location: [facility];  Service: Cardiovascular;  Laterality: N/A;   PPM GENERATOR CHANGEOUT N/A 02/23/2021   Procedure: PPM GENERATOR CHANGEOUT;  Surgeon: [name], MD;  Location: [facility];  Service: Cardiovascular;  Laterality: N/A;   TOTAL KNEE ARTHROPLASTY Right 12/16/2018   Procedure: TOTAL KNEE ARTHROPLASTY;  Surgeon: Vickey Huger, MD;  Location: [facility];  Service: Orthopedics;  Laterality: Right;  75 mins needed for length of case   TRANSURETHRAL RESECTION OF PROSTATE N/A 12/29/2013   Procedure: TRANSURETHRAL RESECTION OF THE PROSTATE WITH GYRUS INSTRUMENTS;  Surgeon: [name], MD;  Location: [facility];  Service: Urology;  Laterality: N/A;    Current Outpatient  Medications  Medication Sig Dispense Refill   allopurinol (ZYLOPRIM) 300 MG tablet Take 300 mg by mouth daily.     atorvastatin (LIPITOR) 80 MG tablet Take 80 mg by mouth at bedtime.     carvedilol (COREG) 3.125 MG tablet TAKE 1 TABLET TWICE DAILY WITH MEALS 180 tablet 3   diclofenac Sodium (VOLTAREN) 1 % GEL Apply 1 application topically 2 (two) times daily as needed (pain).     diphenhydramine-acetaminophen (TYLENOL PM) 25-500 MG TABS tablet Take 2 tablets by mouth at bedtime.     ELIQUIS 5 MG TABS tablet TAKE 1 TABLET TWICE DAILY 180 tablet 1   escitalopram (LEXAPRO) 10 MG tablet Take 10 mg by mouth every evening.     isosorbide mononitrate (IMDUR) 30 MG 24 hr tablet TAKE 1 TABLET TWICE DAILY (Patient taking differently: 30 mg in the morning and at bedtime.) 180 tablet 2   methocarbamol (ROBAXIN) 500 MG tablet Take 1-2 tablets (500-1,000 mg total) by mouth every 6 (six) hours as needed for muscle spasms. (Patient taking differently: Take 500 mg by mouth in the morning and at bedtime.) 60 tablet 0   nitroGLYCERIN (NITROSTAT) 0.4 MG SL tablet Place 1 tablet (0.4 mg total) under the tongue every 5 (five) minutes as needed for chest pain. DO NOT EXCEED A TOTAL OF 3 DOSES IN 15 MINUTES 75 tablet 1   Omega-3 Fatty Acids (FISH OIL) 1000 MG CAPS Take 1,000 mg by mouth daily.     ranolazine (RANEXA) 500 MG 12 hr tablet TAKE 1 TABLET TWICE DAILY 180 tablet 1   sotalol (BETAPACE) 120 MG tablet TAKE 1 TABLET EVERY 12 HOURS (PLEASE KEEP UPCOMING APPOINTMENT FOR FUTURE REFILLS. THANK YOU) 180 tablet 1   Turmeric 500 MG CAPS Take 500 mg by mouth daily.     No current facility-administered medications for this visit.    Allergies  Allergen Reactions   Sulfa Antibiotics Other (See Comments)    SWELLING ON PATIENTS LIPS, HE GETS DRIED UP   Sulfamethoxazole Other (See Comments)    Unknown   Sulfamethoxazole-Trimethoprim Other (See Comments)    Unknown    Review of Systems negative except from HPI and  PMH  Physical Exam BP 110/74   Pulse 62   Ht '5\' 6"'$  (1.676 m)   Wt 189 lb 9.6 oz (86 kg)   SpO2 96%   BMI 30.60 kg/m  Well developed and well nourished in no acute distress HENT normal Neck supple with JVP-flat Clear Device pocket well healed; without hematoma or erythema.  There is  no tethering  Regular rate and rhythm, no murmur Abd-soft with active BS No Clubbing cyanosis  edema Skin-warm and dry A & Oriented  Grossly normal sensory and motor function  ECG: AV pacing at 62 Intervals 32/15/48  Device function is normal. Programming changes inactivated VIP and shortness PA VA from 250--180 See Paceart for details    Assessment and  Plan  Ischemic heart disease with prior bypass surgery   Exertional dyspnea-   Sinus node dysfunction and chronotropic incompetence  Pacemaker-[employer]    Low voltage R wave  High Risk Medication Surveillance-sotalol  AF paroxsymal  AV conduction 1 AVBlock   Inability to conduct intrinsically over a rate of about 60.  Will inactivated VIP to maintain AV synchrony  No interval atrial fibrillation of note.  Continue sotalol surveillance laboratories were okay in 11/23 no bleeding on Eliquis.  Dosed appropriately for weight and renal function

## 2022-04-11 LAB — CUP PACEART INCLINIC DEVICE CHECK
Battery Remaining Longevity: 99 mo
Battery Voltage: 3.01 V
Brady Statistic RA Percent Paced: 98 %
Brady Statistic RV Percent Paced: 86 %
Date Time Interrogation Session: 20240311164700
Implantable Lead Connection Status: 753985
Implantable Lead Connection Status: 753985
Implantable Lead Implant Date: 20130425
Implantable Lead Implant Date: 20130425
Implantable Lead Location: 753859
Implantable Lead Location: 753860
Implantable Pulse Generator Implant Date: 20230125
Lead Channel Impedance Value: 410 Ohm
Lead Channel Impedance Value: 430 Ohm
Lead Channel Pacing Threshold Amplitude: 0.5 V
Lead Channel Pacing Threshold Amplitude: 0.625 V
Lead Channel Pacing Threshold Amplitude: 0.875 V
Lead Channel Pacing Threshold Amplitude: 1 V
Lead Channel Pacing Threshold Pulse Width: 0.4 ms
Lead Channel Pacing Threshold Pulse Width: 0.4 ms
Lead Channel Pacing Threshold Pulse Width: 0.4 ms
Lead Channel Pacing Threshold Pulse Width: 0.4 ms
Lead Channel Sensing Intrinsic Amplitude: 2.4 mV
Lead Channel Sensing Intrinsic Amplitude: 4.8 mV
Lead Channel Setting Pacing Amplitude: 1.125
Lead Channel Setting Pacing Amplitude: 1.625
Lead Channel Setting Pacing Pulse Width: 0.4 ms
Lead Channel Setting Sensing Sensitivity: 0.5 mV
Pulse Gen Model: 2272
Pulse Gen Serial Number: 3997722

## 2022-05-01 ENCOUNTER — Other Ambulatory Visit: Payer: Self-pay | Admitting: Cardiovascular Disease

## 2022-05-01 DIAGNOSIS — I480 Paroxysmal atrial fibrillation: Secondary | ICD-10-CM

## 2022-05-01 NOTE — Telephone Encounter (Signed)
Prescription refill request for Eliquis received. Indication: a fib Last office visit: 04/10/22 Scr: 1.11 02/24/22 care everywehre Age: 82 Weight: 85kg

## 2022-05-24 ENCOUNTER — Ambulatory Visit (INDEPENDENT_AMBULATORY_CARE_PROVIDER_SITE_OTHER): Payer: Medicare HMO

## 2022-05-24 DIAGNOSIS — I495 Sick sinus syndrome: Secondary | ICD-10-CM

## 2022-05-24 LAB — CUP PACEART REMOTE DEVICE CHECK
Battery Remaining Longevity: 103 mo
Battery Remaining Percentage: 89 %
Battery Voltage: 3.01 V
Brady Statistic AP VP Percent: 97 %
Brady Statistic AP VS Percent: 1 %
Brady Statistic AS VP Percent: 3.3 %
Brady Statistic AS VS Percent: 1 %
Brady Statistic RA Percent Paced: 96 %
Brady Statistic RV Percent Paced: 99 %
Date Time Interrogation Session: 20240424025457
Implantable Lead Connection Status: 753985
Implantable Lead Connection Status: 753985
Implantable Lead Implant Date: 20130425
Implantable Lead Implant Date: 20130425
Implantable Lead Location: 753859
Implantable Lead Location: 753860
Implantable Pulse Generator Implant Date: 20230125
Lead Channel Impedance Value: 390 Ohm
Lead Channel Impedance Value: 410 Ohm
Lead Channel Pacing Threshold Amplitude: 0.5 V
Lead Channel Pacing Threshold Amplitude: 1 V
Lead Channel Pacing Threshold Pulse Width: 0.4 ms
Lead Channel Pacing Threshold Pulse Width: 0.4 ms
Lead Channel Sensing Intrinsic Amplitude: 2.5 mV
Lead Channel Sensing Intrinsic Amplitude: 3.9 mV
Lead Channel Setting Pacing Amplitude: 1.25 V
Lead Channel Setting Pacing Amplitude: 1.5 V
Lead Channel Setting Pacing Pulse Width: 0.4 ms
Lead Channel Setting Sensing Sensitivity: 0.5 mV
Pulse Gen Model: 2272
Pulse Gen Serial Number: 3997722

## 2022-06-20 NOTE — Progress Notes (Signed)
Remote pacemaker transmission.   

## 2022-08-23 ENCOUNTER — Ambulatory Visit (INDEPENDENT_AMBULATORY_CARE_PROVIDER_SITE_OTHER): Payer: Medicare HMO

## 2022-08-23 DIAGNOSIS — I495 Sick sinus syndrome: Secondary | ICD-10-CM | POA: Diagnosis not present

## 2022-08-24 LAB — CUP PACEART REMOTE DEVICE CHECK
Battery Remaining Longevity: 98 mo
Battery Remaining Percentage: 86 %
Battery Voltage: 3.01 V
Brady Statistic AP VS Percent: 1 %
Brady Statistic AS VP Percent: 5.1 %
Brady Statistic AS VS Percent: 1 %
Brady Statistic RA Percent Paced: 93 %
Brady Statistic RV Percent Paced: 99 %
Date Time Interrogation Session: 20240724093055
Implantable Lead Connection Status: 753985
Implantable Lead Connection Status: 753985
Implantable Lead Implant Date: 20130425
Implantable Lead Implant Date: 20130425
Implantable Lead Location: 753860
Implantable Pulse Generator Implant Date: 20230125
Lead Channel Impedance Value: 380 Ohm
Lead Channel Impedance Value: 390 Ohm
Lead Channel Pacing Threshold Amplitude: 0.625 V
Lead Channel Pacing Threshold Amplitude: 1 V
Lead Channel Pacing Threshold Pulse Width: 0.4 ms
Lead Channel Pacing Threshold Pulse Width: 0.4 ms
Lead Channel Sensing Intrinsic Amplitude: 2 mV
Lead Channel Sensing Intrinsic Amplitude: 4.5 mV
Lead Channel Setting Pacing Amplitude: 1.25 V
Lead Channel Setting Pacing Amplitude: 1.625
Lead Channel Setting Pacing Pulse Width: 0.4 ms
Lead Channel Setting Sensing Sensitivity: 0.5 mV
Pulse Gen Model: 2272
Pulse Gen Serial Number: 3997722

## 2022-09-05 NOTE — Progress Notes (Signed)
Remote pacemaker transmission.   

## 2022-10-04 ENCOUNTER — Telehealth: Payer: Self-pay | Admitting: *Deleted

## 2022-10-04 NOTE — Telephone Encounter (Signed)
Patient with diagnosis of afib on Eliquis for anticoagulation.    Procedure: total knee replacement Date of procedure: TBD  CHA2DS2-VASc Score = 4  This indicates a 4.8% annual risk of stroke. The patient's score is based upon: CHF History: 0 HTN History: 1 Diabetes History: 0 Stroke History: 0 Vascular Disease History: 1 Age Score: 2 Gender Score: 0   CrCl 66mL/min Platelet count 157K  Per office protocol, patient can hold Eliquis for 3 days prior to procedure.    **This guidance is not considered finalized until pre-operative APP has relayed final recommendations.**

## 2022-10-04 NOTE — Telephone Encounter (Signed)
Pt hs been scheduled for tele pre op appt 10/11/22 @ 1:40. Med rec and consent are done.     Patient Consent for Virtual Visit        Dylan Roth has provided verbal consent on 10/04/2022 for a virtual visit (video or telephone).   CONSENT FOR VIRTUAL VISIT FOR:  Dylan Roth  By participating in this virtual visit I agree to the following:  I hereby voluntarily request, consent and authorize [facility] and its employed or contracted physicians, physician assistants, nurse practitioners or other licensed health care professionals (the Practitioner), to provide me with telemedicine health care services (the "Services") as deemed necessary by the treating Practitioner. I acknowledge and consent to receive the Services by the Practitioner via telemedicine. I understand that the telemedicine visit will involve communicating with the Practitioner through live audiovisual communication technology and the disclosure of certain medical information by electronic transmission. I acknowledge that I have been given the opportunity to request an in-person assessment or other available alternative prior to the telemedicine visit and am voluntarily participating in the telemedicine visit.  I understand that I have the right to withhold or withdraw my consent to the use of telemedicine in the course of my care at any time, without affecting my right to future care or treatment, and that the Practitioner or I may terminate the telemedicine visit at any time. I understand that I have the right to inspect all information obtained and/or recorded in the course of the telemedicine visit and may receive copies of available information for a reasonable fee.  I understand that some of the potential risks of receiving the Services via telemedicine include:  Delay or interruption in medical evaluation due to technological equipment failure or disruption; Information transmitted may not be sufficient (e.g.  poor resolution of images) to allow for appropriate medical decision making by the Practitioner; and/or  In rare instances, security protocols could fail, causing a breach of personal health information.  Furthermore, I acknowledge that it is my responsibility to provide information about my medical history, conditions and care that is complete and accurate to the best of my ability. I acknowledge that Practitioner's advice, recommendations, and/or decision may be based on factors not within their control, such as incomplete or inaccurate data provided by me or distortions of diagnostic images or specimens that may result from electronic transmissions. I understand that the practice of medicine is not an exact science and that Practitioner makes no warranties or guarantees regarding treatment outcomes. I acknowledge that a copy of this consent can be made available to me via my patient portal [facility] MyChart), or I can request a printed copy by calling the office of [facility].    I understand that my insurance will be billed for this visit.   I have read or had this consent read to me. I understand the contents of this consent, which adequately explains the benefits and risks of the Services being provided via telemedicine.  I have been provided ample opportunity to ask questions regarding this consent and the Services and have had my questions answered to my satisfaction. I give my informed consent for the services to be provided through the use of telemedicine in my medical care

## 2022-10-04 NOTE — Telephone Encounter (Signed)
Pt hs been scheduled for tele pre op appt 10/11/22 @ 1:40. Med rec and consent are done.

## 2022-10-04 NOTE — Telephone Encounter (Signed)
   Pre-operative Risk Assessment    Patient Name: Dylan Roth  DOB: [birth year] MRN: [id number]      Request for Surgical Clearance    Procedure:   Total Knee Replacement  Date of Surgery:  Clearance TBD                                 Surgeon:  Dr. Georgena Spurling Surgeon's Group or Practice Name:  [facility]  Phone number:  [phone] Fax number:  [phone]   Type of Clearance Requested:   - Medical  - Pharmacy:  Hold Apixaban (Eliquis) Not Indicated.   Type of Anesthesia:  Spinal   Additional requests/questions:    Signed, Emmit Pomfret   10/04/2022, 7:08 AM

## 2022-10-04 NOTE — Telephone Encounter (Signed)
   Name: Dylan Roth  DOB: [birth year]  MRN: [id number]  Primary Cardiologist: Verne Carrow, MD   Preoperative team, please contact this patient and set up a phone call appointment for further preoperative risk assessment. Please obtain consent and complete medication review. Thank you for your help.  I confirm that guidance regarding antiplatelet and oral anticoagulation therapy has been completed and, if necessary, noted below.  Per office protocol, patient can hold Eliquis for 3 days prior to procedure.       Joylene Grapes, NP 10/04/2022, 10:09 AM [facility]

## 2022-10-11 ENCOUNTER — Ambulatory Visit: Payer: Medicare HMO | Attending: Cardiovascular Disease

## 2022-10-11 DIAGNOSIS — Z01810 Encounter for preprocedural cardiovascular examination: Secondary | ICD-10-CM | POA: Diagnosis not present

## 2022-10-11 NOTE — Progress Notes (Signed)
Virtual Visit via Telephone Note   Because of Dylan Roth's co-morbid illnesses, he is at least at moderate risk for complications without adequate follow up.  This format is felt to be most appropriate for this patient at this time.  The patient did not have access to video technology/had technical difficulties with video requiring transitioning to audio format only (telephone).  All issues noted in this document were discussed and addressed.  No physical exam could be performed with this format.  Please refer to the patient's chart for his consent to telehealth for [facility].  Evaluation Performed:  Preoperative cardiovascular risk assessment _____________   Date:  10/11/2022   Patient ID:  Dylan Roth, DOB 1940-06-04, MRN [id number] Patient Location:  Home Provider location:   Office  Primary Care Provider:  Raynelle Roth., MD Primary Cardiologist:  Verne Carrow, MD  Chief Complaint / Patient Profile   82 y.o. y/o male with a h/o coronary artery disease status post CABG x 5, paroxysmal atrial fibrillation/flutter, hypertension, hyperlipidemia who is pending total knee replacement and presents today for telephonic preoperative cardiovascular risk assessment.  History of Present Illness    Dylan Roth is a 82 y.o. male who presents via audio/video conferencing for a telehealth visit today.  Pt was last seen in cardiology clinic on 04/10/2022 by Dr. Clifton James.  At that time Dylan Roth was doing well .  The patient is now pending procedure as outlined above. Since his last visit, he remains stable from a cardiac standpoint.  Today he denies , shortness of breath, lower extremity edema, fatigue, palpitations, melena, hematuria, hemoptysis, diaphoresis, weakness, presyncope, syncope, orthopnea, and PND.   Past Medical History    Past Medical History:  Diagnosis Date   Anemia    Angina    Arthritis    "in my back"   Atrial  fibrillation/flutter    BPH (benign prostatic hyperplasia)    CAD (coronary artery disease) Dec 2005   Hx MI. 80% left main, 80% LAD, 90% ramus, 50% circ, 90% in nondominant RCA,  EF normal  2010--echo   Cataract    bilateral-removed 6-7 years ago   Central obesity    Chronic back pain    Dysrhythmia    Erectile dysfunction    GERD (gastroesophageal reflux disease)    pt. denies   Gout    "once; I have it under control w/medicine"   History of anemia    History of colon polyps    Hyperlipidemia    Hypertension    IBS (irritable bowel syndrome)    with diarrhea   Myocardial infarction (HCC)    pt. denies at preop   Pacemaker    Palpitations    Sinus node dysfunction (HCC)    post termination pauses <5sec   Past Surgical History:  Procedure Laterality Date   CARDIAC CATHETERIZATION  2005   "that what sent me to CABG"   CARDIAC CATHETERIZATION N/A 09/22/2015   Procedure: Left Heart Cath and Cors/Grafts Angiography;  Surgeon: Kathleene Hazel, MD;  Location: [facility] INVASIVE CV LAB;  Service: Cardiovascular;  Laterality: N/A;   CATARACT EXTRACTION W/ INTRAOCULAR LENS  IMPLANT, BILATERAL  ~ 01/2011   CHOLECYSTECTOMY  1978    COLONOSCOPY  2014   (Polypectomy) colonic polyps status post polypectomy. Pancolonic diverticulosis predominantly in the sigmoid colon. Small internal hemorrhoids. Patient also had perirectal excoriation   CORONARY ARTERY BYPASS GRAFT  2005   in [place] - LIMA to LAD,  SVG to diagonal, SVG to ramus, SVG to OM and SVG to RCA    EYE SURGERY     retina surgey right eye in [place]   PACEMAKER INSERTION  2014   PERMANENT PACEMAKER INSERTION N/A 05/25/2011   Procedure: PERMANENT PACEMAKER INSERTION;  Surgeon: Duke Salvia, MD;  Location: [facility] CATH LAB;  Service: Cardiovascular;  Laterality: N/A;   PPM GENERATOR CHANGEOUT N/A 02/23/2021   Procedure: PPM GENERATOR CHANGEOUT;  Surgeon: Duke Salvia, MD;  Location: [facility] INVASIVE CV LAB;  Service: Cardiovascular;   Laterality: N/A;   TOTAL KNEE ARTHROPLASTY Right 12/16/2018   Procedure: TOTAL KNEE ARTHROPLASTY;  Surgeon: Dannielle Huh, MD;  Location: [facility] ORS;  Service: Orthopedics;  Laterality: Right;  75 mins needed for length of case   TRANSURETHRAL RESECTION OF PROSTATE N/A 12/29/2013   Procedure: TRANSURETHRAL RESECTION OF THE PROSTATE WITH GYRUS INSTRUMENTS;  Surgeon: Valetta Fuller, MD;  Location: [facility] ORS;  Service: Urology;  Laterality: N/A;    Allergies  Allergies  Allergen Reactions   Sulfa Antibiotics Other (See Comments)    SWELLING ON PATIENTS LIPS, HE GETS DRIED UP   Sulfamethoxazole Other (See Comments)    Unknown   Sulfamethoxazole-Trimethoprim Other (See Comments)    Unknown    Home Medications    Prior to Admission medications   Medication Sig Start Date End Date Taking? Authorizing Provider  allopurinol (ZYLOPRIM) 300 MG tablet Take 300 mg by mouth daily.    [provider]  apixaban (ELIQUIS) 5 MG TABS tablet TAKE 1 TABLET TWICE DAILY 05/01/22   Kathleene Hazel, MD  atorvastatin (LIPITOR) 80 MG tablet Take 80 mg by mouth at bedtime. 02/15/18   [provider]  carvedilol (COREG) 3.125 MG tablet TAKE 1 TABLET TWICE DAILY WITH MEALS 04/10/22   Kathleene Hazel, MD  diclofenac Sodium (VOLTAREN) 1 % GEL Apply 1 application topically 2 (two) times daily as needed (pain).    [provider]  diphenhydramine-acetaminophen (TYLENOL PM) 25-500 MG TABS tablet Take 2 tablets by mouth at bedtime.    [provider]  escitalopram (LEXAPRO) 10 MG tablet Take 10 mg by mouth every evening. 11/10/19   [provider]  isosorbide mononitrate (IMDUR) 30 MG 24 hr tablet TAKE 1 TABLET TWICE DAILY 04/12/22   Duke Salvia, MD  methocarbamol (ROBAXIN) 500 MG tablet Take 1-2 tablets (500-1,000 mg total) by mouth every 6 (six) hours as needed for muscle spasms. Patient taking differently: Take 500 mg by mouth in the morning and at bedtime.  12/17/18   Guy Sandifer, PA  nitroGLYCERIN (NITROSTAT) 0.4 MG SL tablet Place 1 tablet (0.4 mg total) under the tongue every 5 (five) minutes as needed for chest pain. DO NOT EXCEED A TOTAL OF 3 DOSES IN 15 MINUTES 08/26/19   Kathleene Hazel, MD  Omega-3 Fatty Acids (FISH OIL) 1000 MG CAPS Take 1,000 mg by mouth daily.    [provider]  ranolazine (RANEXA) 500 MG 12 hr tablet TAKE 1 TABLET TWICE DAILY 05/01/22   Kathleene Hazel, MD  sotalol (BETAPACE) 120 MG tablet TAKE 1 TABLET EVERY 12 HOURS (PLEASE KEEP UPCOMING APPOINTMENT FOR FUTURE REFILLS. THANK YOU) 05/01/22   Kathleene Hazel, MD  Turmeric 500 MG CAPS Take 500 mg by mouth daily.    [provider]    Physical Exam    Vital Signs:  ALGIRDAS SOINE does not have vital signs available for review today.  Given telephonic nature of  communication, physical exam is limited. AAOx3. NAD. Normal affect.  Speech and respirations are unlabored.  Accessory Clinical Findings    None  Assessment & Plan    1.  Preoperative Cardiovascular Risk Assessment: Total knee replacement, Dr. Georgena Spurling, [facility], fax #[phone]      Primary Cardiologist: Verne Carrow, MD  Chart reviewed as part of pre-operative protocol coverage. Given past medical history and time since last visit, based on ACC/AHA guidelines, EDMAR IGARASHI would be at acceptable risk for the planned procedure without further cardiovascular testing.   Patient was advised that if he develops new symptoms prior to surgery to contact our office to arrange a follow-up appointment.  He verbalized understanding.  His RCRI is a class III risk, 6.6% risk of major cardiac event.  He is able to complete greater than 4 METS of physical activity.  Patient with diagnosis of afib on Eliquis for anticoagulation.     Procedure: total knee replacement Date of procedure: TBD   CHA2DS2-VASc  Score = 4  This indicates a 4.8% annual risk of stroke. The patient's score is based upon: CHF History: 0 HTN History: 1 Diabetes History: 0 Stroke History: 0 Vascular Disease History: 1 Age Score: 2 Gender Score: 0   CrCl 21mL/min Platelet count 157K   Per office protocol, patient can hold Eliquis for 3 days prior to procedure    I will route this recommendation to the requesting party via Epic fax function and remove from pre-op pool.       Time:   Today, I have spent 5 minutes with the patient with telehealth technology discussing medical history, symptoms, and management plan.  Prior to patient's phone evaluation I spent greater than 10 minutes reviewing their past medical history and cardiac medications.    Ronney Asters, NP  10/11/2022, 7:11 AM

## 2022-10-18 NOTE — Telephone Encounter (Signed)
Clearance faxed via Epic [phone]

## 2022-10-18 NOTE — Telephone Encounter (Signed)
Would like for [place] to send the clearance to the office. Please advise

## 2022-10-20 ENCOUNTER — Telehealth: Payer: Self-pay | Admitting: Cardiovascular Disease

## 2022-10-20 NOTE — Telephone Encounter (Signed)
Patient's daughter called stating [facility] has not received her father pre op clearance, and if it could please be re-faxed to them.

## 2022-10-20 NOTE — Telephone Encounter (Signed)
I have re-faxed cardiac clearance updates to [facility]. I will check back later if they have received it

## 2022-10-24 NOTE — Telephone Encounter (Signed)
Spoke with Dylan Roth with [facility] and she informed me that they still haven't received the clearance. Dylan Roth and I have realized that we had the wrong fax number. The fax number is [phone]. I have re-faxed it to the correct fax number and Dylan Roth states if she still doesn't receive she will call our office to let us know.

## 2022-11-15 ENCOUNTER — Ambulatory Visit: Payer: Medicare HMO | Attending: Cardiovascular Disease | Admitting: Cardiovascular Disease

## 2022-11-15 ENCOUNTER — Encounter: Payer: Self-pay | Admitting: Cardiovascular Disease

## 2022-11-15 VITALS — BP 130/70 | HR 64 | Ht 66.0 in | Wt 193.4 lb

## 2022-11-15 DIAGNOSIS — I25119 Atherosclerotic heart disease of native coronary artery with unspecified angina pectoris: Secondary | ICD-10-CM | POA: Diagnosis not present

## 2022-11-15 DIAGNOSIS — I10 Essential (primary) hypertension: Secondary | ICD-10-CM

## 2022-11-15 DIAGNOSIS — E7800 Pure hypercholesterolemia, unspecified: Secondary | ICD-10-CM

## 2022-11-15 DIAGNOSIS — I351 Nonrheumatic aortic (valve) insufficiency: Secondary | ICD-10-CM

## 2022-11-15 DIAGNOSIS — I480 Paroxysmal atrial fibrillation: Secondary | ICD-10-CM

## 2022-11-15 DIAGNOSIS — Z01810 Encounter for preprocedural cardiovascular examination: Secondary | ICD-10-CM

## 2022-11-15 NOTE — Patient Instructions (Addendum)
Medication Instructions:  No changes *If you need a refill on your cardiac medications before your next appointment, please call your pharmacy*   Lab Work: none   Testing/Procedures: Your physician has requested that you have an echocardiogram. Echocardiography is a painless test that uses sound waves to create images of your heart. It provides your doctor with information about the size and shape of your heart and how well your heart's chambers and valves are working. This procedure takes approximately one hour. There are no restrictions for this procedure. Please do NOT wear cologne, perfume, aftershave, or lotions (deodorant is allowed). Please arrive 15 minutes prior to your appointment time.   Follow-Up: At [facility], you and your health needs are our priority.  As part of our continuing mission to provide you with exceptional heart care, we have created designated Provider Care Teams.  These Care Teams include your primary Cardiologist (physician) and Advanced Practice Providers (APPs -  Physician Assistants and Nurse Practitioners) who all work together to provide you with the care you need, when you need it.  We recommend signing up for the patient portal called "MyChart".  Sign up information is provided on this After Visit Summary.  MyChart is used to connect with patients for Virtual Visits (Telemedicine).  Patients are able to view lab/test results, encounter notes, upcoming appointments, etc.  Non-urgent messages can be sent to your provider as well.   To learn more about what you can do with MyChart, go to [email or web address].    Your next appointment:   12 month(s)  Provider:   Verne Carrow, MD

## 2022-11-15 NOTE — Progress Notes (Signed)
Chief Complaint  Patient presents with   Follow-up    CAD   History of Present Illness: 82 yo male with history of CAD s/p 5V CABG 2005, paroxysmal atrial fibrillation/flutter, HTN, HLD, sinus node dysfunction s/p pacemaker implantation who is here today for cardiac follow up. His atrial fibrillation/flutter and pacemaker have been followed by Dr. Graciela Husbands. He saw Dr. Graciela Husbands in April 2013 and was started on dofetilide for atrial fibrillation. He had post-termination pauses and had a pacemaker implanted in April 2013. He has since had Tikosyn stopped due to cost and was started on sotalol. He is on Eliquis.  I saw him in January 2017 and he c/o daily sharp chest pains. Imdur was added. I saw him in August 2017 and he continued to have chest pain several days per week. Cardiac cath 09/22/15 with 3 patent grafts but could not visualize the other two grafts. The RCA was occluded proximally with no graft to this vessel. We discussed a cardiac CTA but he refused due to claustrophobia. Ranexa added to his regimen in December 2017. Echo October 2022 with LVEF=55%, mild MR, mild to moderate AI. His pacemaker generator was changed in January 2023.   He is here today for follow up. The patient denies any chest pain, dyspnea, palpitations, lower extremity edema, orthopnea, PND, dizziness, near syncope or syncope.    Primary Care Physician: Raynelle Jan., MD  Past Medical History:  Diagnosis Date   Anemia    Angina    Arthritis    "in my back"   Atrial fibrillation/flutter    BPH (benign prostatic hyperplasia)    CAD (coronary artery disease) Dec 2005   Hx MI. 80% left main, 80% LAD, 90% ramus, 50% circ, 90% in nondominant RCA,  EF normal  2010--echo   Cataract    bilateral-removed 6-7 years ago   Central obesity    Chronic back pain    Dysrhythmia    Erectile dysfunction    GERD (gastroesophageal reflux disease)    pt. denies   Gout    "once; I have it under control w/medicine"   History of  anemia    History of colon polyps    Hyperlipidemia    Hypertension    IBS (irritable bowel syndrome)    with diarrhea   Myocardial infarction (HCC)    pt. denies at preop   Pacemaker    Palpitations    Sinus node dysfunction (HCC)    post termination pauses <5sec    Past Surgical History:  Procedure Laterality Date   CARDIAC CATHETERIZATION  2005   "that what sent me to CABG"   CARDIAC CATHETERIZATION N/A 09/22/2015   Procedure: Left Heart Cath and Cors/Grafts Angiography;  Surgeon: Kathleene Hazel, MD;  Location: [facility] INVASIVE CV LAB;  Service: Cardiovascular;  Laterality: N/A;   CATARACT EXTRACTION W/ INTRAOCULAR LENS  IMPLANT, BILATERAL  ~ 01/2011   CHOLECYSTECTOMY  1978    COLONOSCOPY  2014   (Polypectomy) colonic polyps status post polypectomy. Pancolonic diverticulosis predominantly in the sigmoid colon. Small internal hemorrhoids. Patient also had perirectal excoriation   CORONARY ARTERY BYPASS GRAFT  2005   in [place] - LIMA to LAD, SVG to diagonal, SVG to ramus, SVG to OM and SVG to RCA    EYE SURGERY     retina surgey right eye in [place]   PACEMAKER INSERTION  2014   PERMANENT PACEMAKER INSERTION N/A 05/25/2011   Procedure: PERMANENT PACEMAKER INSERTION;  Surgeon: Duke Salvia,  MD;  Location: [facility] CATH LAB;  Service: Cardiovascular;  Laterality: N/A;   PPM GENERATOR CHANGEOUT N/A 02/23/2021   Procedure: PPM GENERATOR CHANGEOUT;  Surgeon: Duke Salvia, MD;  Location: [facility] INVASIVE CV LAB;  Service: Cardiovascular;  Laterality: N/A;   TOTAL KNEE ARTHROPLASTY Right 12/16/2018   Procedure: TOTAL KNEE ARTHROPLASTY;  Surgeon: Dannielle Huh, MD;  Location: [facility] ORS;  Service: Orthopedics;  Laterality: Right;  75 mins needed for length of case   TRANSURETHRAL RESECTION OF PROSTATE N/A 12/29/2013   Procedure: TRANSURETHRAL RESECTION OF THE PROSTATE WITH GYRUS INSTRUMENTS;  Surgeon: Valetta Fuller, MD;  Location: [facility] ORS;  Service: Urology;  Laterality: N/A;    Current  Outpatient Medications  Medication Sig Dispense Refill   allopurinol (ZYLOPRIM) 300 MG tablet Take 300 mg by mouth daily.     apixaban (ELIQUIS) 5 MG TABS tablet TAKE 1 TABLET TWICE DAILY 180 tablet 1   atorvastatin (LIPITOR) 80 MG tablet Take 80 mg by mouth at bedtime.     carvedilol (COREG) 3.125 MG tablet TAKE 1 TABLET TWICE DAILY WITH MEALS 180 tablet 3   diclofenac Sodium (VOLTAREN) 1 % GEL Apply 1 application topically 2 (two) times daily as needed (pain).     diphenhydramine-acetaminophen (TYLENOL PM) 25-500 MG TABS tablet Take 2 tablets by mouth at bedtime.     escitalopram (LEXAPRO) 10 MG tablet Take 10 mg by mouth every evening.     isosorbide mononitrate (IMDUR) 30 MG 24 hr tablet TAKE 1 TABLET TWICE DAILY 180 tablet 3   lisinopril (ZESTRIL) 5 MG tablet Take 2.5 mg by mouth daily.     methocarbamol (ROBAXIN) 500 MG tablet Take 1-2 tablets (500-1,000 mg total) by mouth every 6 (six) hours as needed for muscle spasms. (Patient taking differently: Take 500 mg by mouth in the morning and at bedtime.) 60 tablet 0   nitroGLYCERIN (NITROSTAT) 0.4 MG SL tablet Place 1 tablet (0.4 mg total) under the tongue every 5 (five) minutes as needed for chest pain. DO NOT EXCEED A TOTAL OF 3 DOSES IN 15 MINUTES 75 tablet 1   Omega-3 Fatty Acids (FISH OIL) 1000 MG CAPS Take 1,000 mg by mouth daily.     ranolazine (RANEXA) 500 MG 12 hr tablet TAKE 1 TABLET TWICE DAILY 180 tablet 3   sotalol (BETAPACE) 120 MG tablet TAKE 1 TABLET EVERY 12 HOURS (PLEASE KEEP UPCOMING APPOINTMENT FOR FUTURE REFILLS. THANK YOU) 180 tablet 3   Turmeric 500 MG CAPS Take 500 mg by mouth daily.     No current facility-administered medications for this visit.    Allergies  Allergen Reactions   Sulfa Antibiotics Other (See Comments)    SWELLING ON PATIENTS LIPS, HE GETS DRIED UP   Sulfamethoxazole Other (See Comments)    Unknown   Sulfamethoxazole-Trimethoprim Other (See Comments)    Unknown    Social History    Socioeconomic History   Marital status: Married    Spouse name: Not on file   Number of children: 2   Years of education: Not on file   Highest education level: Not on file  Occupational History   Occupation: retired-truck Air traffic controller: RETIRED  Tobacco Use   Smoking status: Former    Current packs/day: 0.00    Types: Cigarettes    Quit date: 01/30/1961    Years since quitting: 61.8   Smokeless tobacco: Never  Vaping Use   Vaping status: Never Used  Substance and Sexual Activity   Alcohol use:  No    Comment: 2 drinks (bourbon) a night   Drug use: No   Sexual activity: Yes  Other Topics Concern   Not on file  Social History Narrative   Married. 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 Quit tobacco 50 years ago..Both parents are deceased, neither one had cardiac issues and no siblings have cardiac issues.    Social Determinants of Health   Financial Resource Strain: Not on file  Food Insecurity: Low Risk  (09/13/2022)   Received from [facility]   Hunger Vital Sign    Worried About Running Out of Food in the Last Year: Never true    Ran Out of Food in the Last Year: Never true  Transportation Needs: Not on file (09/13/2022)  Physical Activity: Unknown (07/03/2022)   Received from [facility]   Exercise Vital Sign    Days of Exercise per Week: 5 days    Minutes of Exercise per Session: Not on file  Stress: Not on file  Social Connections: Not on file  Intimate Partner Violence: Not on file    Family History  Problem Relation Age of Onset   Cancer Mother    Stroke Brother    Heart attack Neg Hx    Colon cancer Neg Hx    Colon polyps Neg Hx    Heart disease Neg Hx    Esophageal cancer Neg Hx    Rectal cancer Neg Hx    Stomach cancer Neg Hx     Review of Systems:  As stated in the HPI and otherwise negative.   BP 130/70   Pulse 64   Ht 5\' 6"  (1.676 m)   Wt 87.7 kg   SpO2 99%   BMI 31.22 kg/m   Physical Examination: General: Well developed, well  nourished, NAD  HEENT: OP clear, mucus membranes moist  SKIN: warm, dry. No rashes. Neuro: No focal deficits  Musculoskeletal: Muscle strength 5/5 all ext  Psychiatric: Mood and affect normal  Neck: No JVD, no carotid bruits, no thyromegaly, no lymphadenopathy.  Lungs:Clear bilaterally, no wheezes, rhonci, crackles Cardiovascular: Regular rate and rhythm. No murmurs, gallops or rubs. Abdomen:Soft. Bowel sounds present. Non-tender.  Extremities: No lower extremity edema. Pulses are 2 + in the bilateral DP/PT.  Echo 11/15/20:  1. Left ventricular ejection fraction, by estimation, is 55%. The left  ventricle has normal function. The left ventricle has no regional wall  motion abnormalities. There is moderate asymmetric left ventricular  hypertrophy of the basal-septal segment (14   mm). Left ventricular diastolic parameters are consistent with Grade I  diastolic dysfunction (impaired relaxation).   2. Right ventricular systolic function is normal. The right ventricular  size is normal.   3. Left atrial size was mildly dilated.   4. Right atrial size was mildly dilated.   5. The mitral valve is normal in structure. Mild mitral valve  regurgitation. No evidence of mitral stenosis.   6. The aortic valve was not well visualized for morphology. There is mild  calcification of the aortic valve. Aortic valve regurgitation is  mild-moderate. No aortic stenosis is present.   EKG:  EKG is not ordered today. The ekg ordered today demonstrates:   Recent Labs: No results found for requested labs within last 365 days.   Lipid Panel Followed in primary care   Wt Readings from Last 3 Encounters:  11/15/22 87.7 kg  04/10/22 85.7 kg  04/10/22 86 kg    Assessment and Plan:   1. CAD s/p  CABG with angina: Last cardiac cath in August 2017 but I could not find two of his grafts. He has refused coronary CTA to better define his graft anatomy. No chest pain suggestive of angina. Continue beta  blocker, statin, Imdur and Ranexa. He has not been on an ASA since he is on Eliquis.   2. Atrial fibrillation, paroxysmal/Sick sinus syndrome: He is in sinus today. He is followed in the EP clinic by Dr. Graciela Husbands. Pacemaker in place with generator change in 2023. Continue beta blocker, sotalol and Eliquis.     3. HTN: BP is controlled. No changes today  4. HLD: His lipids are followed in primary care. Continue statin.      5. Aortic insufficiency: Mild to moderate by echo in 2022. Will repeat echo now  6. Pre-operative cardiovascular examination: He has an upcoming knee replacement in December 2024. He has no angina, CHF. He is very functional easily achieving over 4 METS during the day. He can proceed with his planned knee replacement without further cardiac workup.    Labs/ tests ordered today include:   Orders Placed This Encounter  Procedures   ECHOCARDIOGRAM COMPLETE   Disposition:   F/U with me in 12 months  Signed, Verne Carrow, MD 11/15/2022 3:01 PM    [facility] [address] [place], [place], [place]  [address] Phone: [phone]; Fax: [phone]

## 2022-11-20 ENCOUNTER — Other Ambulatory Visit: Payer: Self-pay | Admitting: Cardiovascular Disease

## 2022-11-20 DIAGNOSIS — I480 Paroxysmal atrial fibrillation: Secondary | ICD-10-CM

## 2022-11-21 NOTE — Telephone Encounter (Signed)
Prescription refill request for Eliquis received. Indication:afib Last office visit:10/24 Scr:1.13  8/24 Age: 82 Weight:87.7  kg  Prescription refilled

## 2022-11-22 ENCOUNTER — Ambulatory Visit (INDEPENDENT_AMBULATORY_CARE_PROVIDER_SITE_OTHER): Payer: Medicare HMO

## 2022-11-22 DIAGNOSIS — I495 Sick sinus syndrome: Secondary | ICD-10-CM

## 2022-11-22 DIAGNOSIS — I480 Paroxysmal atrial fibrillation: Secondary | ICD-10-CM

## 2022-11-23 LAB — CUP PACEART REMOTE DEVICE CHECK
Battery Remaining Longevity: 95 mo
Battery Remaining Percentage: 83 %
Battery Voltage: 3.01 V
Brady Statistic AP VP Percent: 96 %
Brady Statistic AP VS Percent: 1 %
Brady Statistic AS VP Percent: 3.7 %
Brady Statistic AS VS Percent: 1 %
Brady Statistic RA Percent Paced: 94 %
Brady Statistic RV Percent Paced: 99 %
Date Time Interrogation Session: 20241023020014
Implantable Lead Connection Status: 753985
Implantable Lead Connection Status: 753985
Implantable Lead Implant Date: 20130425
Implantable Lead Implant Date: 20130425
Implantable Lead Location: 753859
Implantable Lead Location: 753860
Implantable Pulse Generator Implant Date: 20230125
Lead Channel Impedance Value: 380 Ohm
Lead Channel Impedance Value: 390 Ohm
Lead Channel Pacing Threshold Amplitude: 0.5 V
Lead Channel Pacing Threshold Amplitude: 0.875 V
Lead Channel Pacing Threshold Pulse Width: 0.4 ms
Lead Channel Pacing Threshold Pulse Width: 0.4 ms
Lead Channel Sensing Intrinsic Amplitude: 3 mV
Lead Channel Sensing Intrinsic Amplitude: 4 mV
Lead Channel Setting Pacing Amplitude: 1.125
Lead Channel Setting Pacing Amplitude: 1.5 V
Lead Channel Setting Pacing Pulse Width: 0.4 ms
Lead Channel Setting Sensing Sensitivity: 0.5 mV
Pulse Gen Model: 2272
Pulse Gen Serial Number: 3997722

## 2022-12-07 ENCOUNTER — Ambulatory Visit (HOSPITAL_COMMUNITY): Payer: Medicare HMO | Attending: Internal Medicine

## 2022-12-07 DIAGNOSIS — I351 Nonrheumatic aortic (valve) insufficiency: Secondary | ICD-10-CM | POA: Insufficient documentation

## 2022-12-07 LAB — ECHOCARDIOGRAM COMPLETE
Area-P 1/2: 3.89 cm2
S' Lateral: 3.1 cm

## 2022-12-11 NOTE — Progress Notes (Signed)
Remote pacemaker transmission.   

## 2022-12-15 NOTE — Progress Notes (Signed)
Sent message, via epic in basket, requesting orders in epic from surgeon.  

## 2022-12-19 NOTE — Progress Notes (Addendum)
Anesthesia Review:  PCP: Joetta Manners LOV 09/14/22  Cardiologist : Clifton James- LOV 11/15/22  Chest x-ray :   Pacemaker - orders req on 12/22/22  Last Device  check- 11/23/22  Orders requested on 12/22/22 Orders in epic on 12/22/22.   EKG : 04/10/22   Echo : 12/07/22  Stress test: Cardiac Cath :  Activity level:  can do a flight of stairs without difficulty  Sleep Study/ CPAP : none  Fasting Blood Sugar :      / Checks Blood Sugar -- times a day:   Blood Thinner/ Instructions /Last Dose: ASA / Instructions/ Last Dose :    Eliquis- stop  3 days prior to surgery per pt    NO orders at preop appt .  Orders req on 12/22/22.

## 2022-12-20 NOTE — Patient Instructions (Addendum)
SURGICAL WAITING ROOM VISITATION  Patients having surgery or a procedure may have no more than 2 support people in the waiting area - these visitors may rotate.    Children under the age of 28 must have an adult with them who is not the patient.  Due to an increase in RSV and influenza rates and associated hospitalizations, children ages 65 and under may not visit patients in [facility] hospitals.  If the patient needs to stay at the hospital during part of their recovery, the visitor guidelines for inpatient rooms apply. Pre-op nurse will coordinate an appropriate time for 1 support person to accompany patient in pre-op.  This support person may not rotate.    Please refer to the [facility] website for the visitor guidelines for Inpatients (after your surgery is over and you are in a regular room).       Your procedure is scheduled on:  01/01/2023    Report to [facility] Main Entrance    Report to admitting at  0530 AM   Call this number if you have problems the morning of surgery [phone]   Do not eat food :After Midnight.   After Midnight you may have the following liquids until ___ 0500___ AM  DAY OF SURGERY  Water Non-Citrus Juices (without pulp, NO RED-Apple, White grape, White cranberry) Black Coffee (NO MILK/CREAM OR CREAMERS, sugar ok)  Clear Tea (NO MILK/CREAM OR CREAMERS, sugar ok) regular and decaf                             Plain Jell-O (NO RED)                                           Fruit ices (not with fruit pulp, NO RED)                                     Popsicles (NO RED)                                                               Sports drinks like Gatorade (NO RED)                           If you have questions, please contact your surgeon's office.      Oral Hygiene is also important to reduce your risk of infection.                                    Remember - BRUSH YOUR TEETH THE MORNING OF SURGERY WITH YOUR REGULAR  TOOTHPASTE  DENTURES WILL BE REMOVED PRIOR TO SURGERY PLEASE DO NOT APPLY "Poly grip" OR ADHESIVES!!!   Do NOT smoke after Midnight   Stop all vitamins and herbal supplements 7 days before surgery.   Take these medicines the morning of surgery with A SIP OF WATER:  allopurinol, coreg, imdur, ranexa, betapace   DO NOT TAKE  ANY ORAL DIABETIC MEDICATIONS DAY OF YOUR SURGERY  Bring CPAP mask and tubing day of surgery.                              You may not have any metal on your body including hair pins, jewelry, and body piercing             Do not wear make-up, lotions, powders, perfumes/cologne, or deodorant  Do not wear nail polish including gel and S&S, artificial/acrylic nails, or any other type of covering on natural nails including finger and toenails. If you have artificial nails, gel coating, etc. that needs to be removed by a nail salon please have this removed prior to surgery or surgery may need to be canceled/ delayed if the surgeon/ anesthesia feels like they are unable to be safely monitored.   Do not shave  48 hours prior to surgery.               Men may shave face and neck.   Do not bring valuables to the hospital. [facility] IS NOT             RESPONSIBLE   FOR VALUABLES.   Contacts, glasses, dentures or bridgework may not be worn into surgery.   Bring small overnight bag day of surgery.   DO NOT BRING YOUR HOME MEDICATIONS TO THE HOSPITAL. PHARMACY WILL DISPENSE MEDICATIONS LISTED ON YOUR MEDICATION LIST TO YOU DURING YOUR ADMISSION IN THE HOSPITAL!    Patients discharged on the day of surgery will not be allowed to drive home.  Someone NEEDS to stay with you for the first 24 hours after anesthesia.   Special Instructions: Bring a copy of your healthcare power of attorney and living will documents the day of surgery if you haven't scanned them before.              Please read over the following fact sheets you were given: IF YOU HAVE QUESTIONS ABOUT YOUR  PRE-OP INSTRUCTIONS PLEASE CALL [phone]   If you received a COVID test during your pre-op visit  it is requested that you wear a mask when out in public, stay away from anyone that may not be feeling well and notify your surgeon if you develop symptoms. If you test positive for Covid or have been in contact with anyone that has tested positive in the last 10 days please notify you surgeon.      Pre-operative 5 CHG Bath Instructions   You can play a key role in reducing the risk of infection after surgery. Your skin needs to be as free of germs as possible. You can reduce the number of germs on your skin by washing with CHG (chlorhexidine gluconate) soap before surgery. CHG is an antiseptic soap that kills germs and continues to kill germs even after washing.   DO NOT use if you have an allergy to chlorhexidine/CHG or antibacterial soaps. If your skin becomes reddened or irritated, stop using the CHG and notify one of our RNs at [phone].   Please shower with the CHG soap starting 4 days before surgery using the following schedule:     Please keep in mind the following:  DO NOT shave, including legs and underarms, starting the day of your first shower.   You may shave your face at any point before/day of surgery.  Place clean sheets on your bed the day you start using CHG  soap. Use a clean washcloth (not used since being washed) for each shower. DO NOT sleep with pets once you start using the CHG.   CHG Shower Instructions:  If you choose to wash your hair and private area, wash first with your normal shampoo/soap.  After you use shampoo/soap, rinse your hair and body thoroughly to remove shampoo/soap residue.  Turn the water OFF and apply about 3 tablespoons (45 ml) of CHG soap to a CLEAN washcloth.  Apply CHG soap ONLY FROM YOUR NECK DOWN TO YOUR TOES (washing for 3-5 minutes)  DO NOT use CHG soap on face, private areas, open wounds, or sores.  Pay special attention to the  area where your surgery is being performed.  If you are having back surgery, having someone wash your back for you may be helpful. Wait 2 minutes after CHG soap is applied, then you may rinse off the CHG soap.  Pat dry with a clean towel  Put on clean clothes/pajamas   If you choose to wear lotion, please use ONLY the CHG-compatible lotions on the back of this paper.     Additional instructions for the day of surgery: DO NOT APPLY any lotions, deodorants, cologne, or perfumes.   Put on clean/comfortable clothes.  Brush your teeth.  Ask your nurse before applying any prescription medications to the skin.      CHG Compatible Lotions   Aveeno Moisturizing lotion  Cetaphil Moisturizing Cream  Cetaphil Moisturizing Lotion  Clairol Herbal Essence Moisturizing Lotion, Dry Skin  Clairol Herbal Essence Moisturizing Lotion, Extra Dry Skin  Clairol Herbal Essence Moisturizing Lotion, Normal Skin  Curel Age Defying Therapeutic Moisturizing Lotion with Alpha Hydroxy  Curel Extreme Care Body Lotion  Curel Soothing Hands Moisturizing Hand Lotion  Curel Therapeutic Moisturizing Cream, Fragrance-Free  Curel Therapeutic Moisturizing Lotion, Fragrance-Free  Curel Therapeutic Moisturizing Lotion, Original Formula  Eucerin Daily Replenishing Lotion  Eucerin Dry Skin Therapy Plus Alpha Hydroxy Crme  Eucerin Dry Skin Therapy Plus Alpha Hydroxy Lotion  Eucerin Original Crme  Eucerin Original Lotion  Eucerin Plus Crme Eucerin Plus Lotion  Eucerin TriLipid Replenishing Lotion  Keri Anti-Bacterial Hand Lotion  Keri Deep Conditioning Original Lotion Dry Skin Formula Softly Scented  Keri Deep Conditioning Original Lotion, Fragrance Free Sensitive Skin Formula  Keri Lotion Fast Absorbing Fragrance Free Sensitive Skin Formula  Keri Lotion Fast Absorbing Softly Scented Dry Skin Formula  Keri Original Lotion  Keri Skin Renewal Lotion Keri Silky Smooth Lotion  Keri Silky Smooth Sensitive Skin Lotion   Nivea Body Creamy Conditioning Oil  Nivea Body Extra Enriched Teacher, adult education Moisturizing Lotion Nivea Crme  Nivea Skin Firming Lotion  NutraDerm 30 Skin Lotion  NutraDerm Skin Lotion  NutraDerm Therapeutic Skin Cream  NutraDerm Therapeutic Skin Lotion  ProShield Protective Hand Cream  Provon moisturizing lotion

## 2022-12-22 ENCOUNTER — Encounter: Payer: Self-pay | Admitting: Internal Medicine

## 2022-12-22 ENCOUNTER — Encounter (HOSPITAL_COMMUNITY): Payer: Self-pay

## 2022-12-22 ENCOUNTER — Other Ambulatory Visit: Payer: Self-pay

## 2022-12-22 ENCOUNTER — Encounter (HOSPITAL_COMMUNITY)
Admission: RE | Admit: 2022-12-22 | Discharge: 2022-12-22 | Disposition: A | Discharge status: Home / Self Care | Payer: Medicare HMO | Source: Ambulatory Visit | Attending: Orthopedic Surgery | Admitting: Orthopedic Surgery

## 2022-12-22 VITALS — BP 146/86 | HR 67 | Temp 97.4°F | Resp 16 | Ht 66.0 in | Wt 186.0 lb

## 2022-12-22 DIAGNOSIS — I252 Old myocardial infarction: Secondary | ICD-10-CM | POA: Insufficient documentation

## 2022-12-22 DIAGNOSIS — E785 Hyperlipidemia, unspecified: Secondary | ICD-10-CM | POA: Insufficient documentation

## 2022-12-22 DIAGNOSIS — K219 Gastro-esophageal reflux disease without esophagitis: Secondary | ICD-10-CM | POA: Diagnosis not present

## 2022-12-22 DIAGNOSIS — Z01812 Encounter for preprocedural laboratory examination: Secondary | ICD-10-CM | POA: Diagnosis present

## 2022-12-22 DIAGNOSIS — I2510 Atherosclerotic heart disease of native coronary artery without angina pectoris: Secondary | ICD-10-CM | POA: Insufficient documentation

## 2022-12-22 DIAGNOSIS — I10 Essential (primary) hypertension: Secondary | ICD-10-CM | POA: Diagnosis not present

## 2022-12-22 DIAGNOSIS — Z01818 Encounter for other preprocedural examination: Secondary | ICD-10-CM

## 2022-12-22 DIAGNOSIS — Z951 Presence of aortocoronary bypass graft: Secondary | ICD-10-CM | POA: Insufficient documentation

## 2022-12-22 DIAGNOSIS — Z87891 Personal history of nicotine dependence: Secondary | ICD-10-CM | POA: Diagnosis not present

## 2022-12-22 DIAGNOSIS — I4891 Unspecified atrial fibrillation: Secondary | ICD-10-CM | POA: Diagnosis not present

## 2022-12-22 LAB — BASIC METABOLIC PANEL
Anion gap: 7 (ref 5–15)
BUN: 20 mg/dL (ref 8–23)
CO2: 25 mmol/L (ref 22–32)
Calcium: 8.7 mg/dL — ABNORMAL LOW (ref 8.9–10.3)
Chloride: 103 mmol/L (ref 98–111)
Creatinine, Ser: 1.01 mg/dL (ref 0.61–1.24)
GFR, Estimated: >60 mL/min (ref >60)
Glucose, Bld: 107 mg/dL — ABNORMAL HIGH (ref 70–99)
Potassium: 4.7 mmol/L (ref 3.5–5.1)
Sodium: 135 mmol/L (ref 135–145)

## 2022-12-22 LAB — CBC
HCT: 42.8 % (ref 39.0–52.0)
Hemoglobin: 14.4 g/dL (ref 13.0–17.0)
MCH: 35.3 pg — ABNORMAL HIGH (ref 26.0–34.0)
MCHC: 33.6 g/dL (ref 30.0–36.0)
MCV: 104.9 fL — ABNORMAL HIGH (ref 80.0–100.0)
Platelets: 160 10*3/uL (ref 150–400)
RBC: 4.08 MIL/uL — ABNORMAL LOW (ref 4.22–5.81)
RDW: 14.7 % (ref 11.5–15.5)
WBC: 6.9 10*3/uL (ref 4.0–10.5)
nRBC: 0 % (ref 0.0–0.2)

## 2022-12-22 NOTE — Progress Notes (Signed)
DISCUSSION: Dylan Roth is an 82 year old male who presents to PAT prior to L TKA on 01/01/23 with Dr. Sherlean Foot. PMH of former smoking, hx of MI, CAD s/p CABG (2005), A.fib/flutter, sinus node dysfunction s/p PPM, HTN, HLD, GERD (not on meds).  Patient follows with Cardiology for CAD s/p CABG, PAF on Eliquis, and sinus node dysfunction s/p PPM. Last seen by general Cardiology on 11/15/22. Noted to be doing well on current medication regimen. No cardiac symptoms noted. Echo completed on 12/07/22 to f/u on hx of mild-mod AI which was unchanged. Advised to f/u in 1 year. Cleared for surgery:   "Pre-operative cardiovascular examination: He has an upcoming knee replacement in December 2024. He has no angina, CHF. He is very functional easily achieving over 4 METS during the day. He can proceed with his planned knee replacement without further cardiac workup."   Device orders in 12/22/22 note: Electrophysiologist's Recommendations:   Have magnet available. Provide continuous ECG monitoring when magnet is used or reprogramming is to be performed.  Procedure should not interfere with device function.  No device programming or magnet placement needed.   Eliquis- stop 3 days prior to surgery per pt   VS: BP (!) 146/86 (BP Location: Right Arm)   Pulse 67   Temp (!) 36.3 C (Oral)   Resp 16   Ht 5\' 6"  (1.676 m)   Wt 84.4 kg   SpO2 100%   BMI 30.02 kg/m   PROVIDERS: Raynelle Jan., MD Cardiology: Clifton James EP: Graciela Husbands  LABS: Labs reviewed: Acceptable for surgery. (all labs ordered are listed, but only abnormal results are displayed)  Labs Reviewed  CBC - Abnormal; Notable for the following components:      Result Value   RBC 4.08 (*)    MCV 104.9 (*)    MCH 35.3 (*)    All other components within normal limits  BASIC METABOLIC PANEL - Abnormal; Notable for the following components:   Glucose, Bld 107 (*)    Calcium 8.7 (*)    All other components within normal limits  SURGICAL PCR  SCREEN     IMAGES:   EKG:   CV:  Echo 12/07/22: IMPRESSIONS    1. Left ventricular ejection fraction, by estimation, is 55 to 60%. Left ventricular ejection fraction by 3D volume is 57 %. The left ventricle has normal function. The left ventricle has no regional wall motion abnormalities. There is mild left ventricular hypertrophy of the basal-septal segment. Left ventricular diastolic parameters are consistent with Grade I diastolic dysfunction (impaired relaxation).  2. Right ventricular systolic function is mildly reduced. The right ventricular size is normal. There is normal pulmonary artery systolic pressure. The estimated right ventricular systolic pressure is 25.7 mmHg.  3. The mitral valve is grossly normal. Trivial mitral valve regurgitation.  4. The aortic valve is tricuspid. Aortic valve regurgitation trivial to mild. Aortic valve sclerosis is present, with no evidence of aortic valve stenosis.  5. Aortic dilatation noted. There is borderline dilatation of the ascending aorta, measuring 38 mm.  6. The inferior vena cava is normal in size with greater than 50% respiratory variability, suggesting right atrial pressure of 3 mmHg.  Comparison(s): Changes from prior study are noted. 11/15/2020: LVEF 55%, mild to moderate AI.  Device check 11/22/22: Scheduled remote reviewed. Normal device function.   Known PAF, on OAC, AF burden is 1% of the time, longest was 1 hour and 42 minutes  Next remote 91 days.   Left heart cath  09/22/2015:  Prox RCA to Mid RCA lesion, 100 %stenosed. Ost 2nd Diag lesion, 100 %stenosed. SVG and is normal in caliber and anatomically normal. LM lesion, 60 %stenosed. Ost Cx lesion, 50 %stenosed. Ost 1st Mrg lesion, 100 %stenosed. SVG graft was visualized by angiography and is normal in caliber and anatomically normal. Prox LAD lesion, 100 %stenosed. LIMA graft was visualized by angiography and is normal in caliber and anatomically  normal. 1st Diag lesion, 90 %stenosed.   1. Severe triple vessel CAD s/p ?5V CABG with 3/5 patent bypass grafts. [place]. The documentation in our record over the years states 5V CABG. No vein graft markers found today so I cannot exclude patency of 2 other vein grafts. Unable to identify vein grafts with aortic root angiogram.  2. Occluded mid LAD with filling of the mid and distal vessel from the patent LIMA graft 3. Occluded intermediate and diagonal branches which fill from the patent vein grafts.  4. Cannot identify a graft to the Diagonal or the RCA 5. Moderate left main stenosis supplying the moderate caliber circumflex.  6. Dilatation of the aortic root.    Recommendations: No targets for PCI but cannot visualize 2 additional vein grafts that are reported in his medical record. Will arrange Coronary CTA to see if we can visualize additional bypass grafts and also to get a better estimate of the size of his aortic root.      Past Medical History:  Diagnosis Date   Anemia    Angina    Arthritis    "in my back"   Atrial fibrillation/flutter    BPH (benign prostatic hyperplasia)    CAD (coronary artery disease) Dec 2005   Hx MI. 80% left main, 80% LAD, 90% ramus, 50% circ, 90% in nondominant RCA,  EF normal  2010--echo   Cataract    bilateral-removed 6-7 years ago   Central obesity    Chronic back pain    Dysrhythmia    Erectile dysfunction    GERD (gastroesophageal reflux disease)    pt. denies   Gout    "once; I have it under control w/medicine"   History of anemia    History of colon polyps    Hyperlipidemia    Hypertension    IBS (irritable bowel syndrome)    with diarrhea   Myocardial infarction (HCC)    pt. denies at preop   Pacemaker    Palpitations    Sinus node dysfunction (HCC)    post termination pauses <5sec    Past Surgical History:  Procedure Laterality Date   CARDIAC CATHETERIZATION  2005   "that what sent me to  CABG"   CARDIAC CATHETERIZATION N/A 09/22/2015   Procedure: Left Heart Cath and Cors/Grafts Angiography;  Surgeon: Kathleene Hazel, MD;  Location: [facility] INVASIVE CV LAB;  Service: Cardiovascular;  Laterality: N/A;   CATARACT EXTRACTION W/ INTRAOCULAR LENS  IMPLANT, BILATERAL  ~ 01/2011   CHOLECYSTECTOMY  1978    COLONOSCOPY  2014   (Polypectomy) colonic polyps status post polypectomy. Pancolonic diverticulosis predominantly in the sigmoid colon. Small internal hemorrhoids. Patient also had perirectal excoriation   CORONARY ARTERY BYPASS GRAFT  2005   in [place] - LIMA to LAD, SVG to diagonal, SVG to ramus, SVG to OM and SVG to RCA    EYE SURGERY     retina surgey right eye in [place]   PACEMAKER INSERTION  2014   PERMANENT PACEMAKER INSERTION N/A 05/25/2011   Procedure:  PERMANENT PACEMAKER INSERTION;  Surgeon: Duke Salvia, MD;  Location: [facility] CATH LAB;  Service: Cardiovascular;  Laterality: N/A;   PPM GENERATOR CHANGEOUT N/A 02/23/2021   Procedure: PPM GENERATOR CHANGEOUT;  Surgeon: Duke Salvia, MD;  Location: [facility] INVASIVE CV LAB;  Service: Cardiovascular;  Laterality: N/A;   TOTAL KNEE ARTHROPLASTY Right 12/16/2018   Procedure: TOTAL KNEE ARTHROPLASTY;  Surgeon: Dannielle Huh, MD;  Location: [facility] ORS;  Service: Orthopedics;  Laterality: Right;  75 mins needed for length of case   TRANSURETHRAL RESECTION OF PROSTATE N/A 12/29/2013   Procedure: TRANSURETHRAL RESECTION OF THE PROSTATE WITH GYRUS INSTRUMENTS;  Surgeon: Valetta Fuller, MD;  Location: [facility] ORS;  Service: Urology;  Laterality: N/A;    MEDICATIONS:  allopurinol (ZYLOPRIM) 300 MG tablet   atorvastatin (LIPITOR) 80 MG tablet   carvedilol (COREG) 3.125 MG tablet   ELIQUIS 5 MG TABS tablet   escitalopram (LEXAPRO) 10 MG tablet   isosorbide mononitrate (IMDUR) 30 MG 24 hr tablet   lisinopril (ZESTRIL) 5 MG tablet   methocarbamol (ROBAXIN) 500 MG tablet   nitroGLYCERIN (NITROSTAT) 0.4 MG SL tablet   ranolazine (RANEXA) 500  MG 12 hr tablet   sotalol (BETAPACE) 120 MG tablet   No current facility-administered medications for this encounter.   Marcille Blanco [facility] Surgical Short Stay/Anesthesiology [facility] Phone [phone] 12/22/2022 2:43 PM

## 2022-12-22 NOTE — Anesthesia Preprocedure Evaluation (Addendum)
Anesthesia Evaluation  Patient identified by MRN, date of birth, ID band Patient awake    Reviewed: Allergy & Precautions, NPO status , Patient's Chart, lab work & pertinent test results, reviewed documented beta blocker date and time   History of Anesthesia Complications Negative for: history of anesthetic complications  Airway Mallampati: II  TM Distance: >3 FB     Dental  (+) Lower Dentures, Upper Dentures   Pulmonary neg sleep apnea, neg COPD, former smoker, neg PE   breath sounds clear to auscultation       Cardiovascular hypertension, (-) angina + CAD, + Past MI and + CABG  (-) Orthopnea + dysrhythmias Atrial Fibrillation + pacemaker  Rhythm:Regular Rate:Normal     Neuro/Psych neg Seizures    GI/Hepatic ,GERD  Controlled,,(+) neg Cirrhosis        Endo/Other    Renal/GU Renal disease     Musculoskeletal  (+) Arthritis ,    Abdominal   Peds  Hematology   Anesthesia Other Findings   Reproductive/Obstetrics                              Anesthesia Physical Anesthesia Plan  ASA: 3  Anesthesia Plan: Spinal   Post-op Pain Management: Regional block*   Induction: Intravenous  PONV Risk Score and Plan: 1 and Ondansetron and Propofol infusion  Airway Management Planned:   Additional Equipment:   Intra-op Plan:   Post-operative Plan:   Informed Consent: I have reviewed the patients History and Physical, chart, labs and discussed the procedure including the risks, benefits and alternatives for the proposed anesthesia with the patient or authorized representative who has indicated his/her understanding and acceptance.     Dental advisory given  Plan Discussed with: CRNA  Anesthesia Plan Comments: (See PAT note from 11/22 by Sherlie Ban PA-C )         Anesthesia Quick Evaluation

## 2022-12-22 NOTE — Progress Notes (Signed)
PERIOPERATIVE PRESCRIPTION FOR IMPLANTED CARDIAC DEVICE PROGRAMMING  Patient Information: Name:  Dylan Roth  DOB:  1940/12/16  MRN:  [id number]  Planned Procedure:   left total knee arthroplasty  Surgeon:  Dr Darcella Cheshire  Date of Procedure:  01/01/2023  Cautery will be used.  Position during surgery:  Device Information:  Clinic EP Physician:  Sherryl Manges, MD   Device Type:  Pacemaker Manufacturer and Phone #:  [employer]. Jude/[employer]ott: [phone] Pacemaker Dependent?:  No. Date of Last Device Check:  11/22/2022 Normal Device Function?:  Yes.    Electrophysiologist's Recommendations:  Have magnet available. Provide continuous ECG monitoring when magnet is used or reprogramming is to be performed.  Procedure should not interfere with device function.  No device programming or magnet placement needed.  Per Device Clinic Standing Orders, Wiliam Ke, RN  1:09 PM 12/22/2022

## 2022-12-24 LAB — SURGICAL PCR SCREEN
MRSA, PCR: NEGATIVE
Staphylococcus aureus: POSITIVE — AB

## 2022-12-25 ENCOUNTER — Other Ambulatory Visit: Payer: Self-pay | Admitting: Orthopedic Surgery

## 2022-12-25 DIAGNOSIS — G8929 Other chronic pain: Secondary | ICD-10-CM

## 2023-01-01 ENCOUNTER — Other Ambulatory Visit: Payer: Self-pay

## 2023-01-01 ENCOUNTER — Encounter (HOSPITAL_COMMUNITY)
Admission: RE | Disposition: A | Discharge status: Home / Self Care | Payer: Self-pay | Source: Home / Self Care | Attending: Orthopedic Surgery

## 2023-01-01 ENCOUNTER — Ambulatory Visit (HOSPITAL_COMMUNITY): Payer: Medicare HMO | Admitting: Medical

## 2023-01-01 ENCOUNTER — Ambulatory Visit (HOSPITAL_COMMUNITY): Payer: Medicare HMO | Admitting: Certified Registered Nurse Anesthetist

## 2023-01-01 ENCOUNTER — Encounter (HOSPITAL_COMMUNITY): Payer: Self-pay | Admitting: Orthopedic Surgery

## 2023-01-01 ENCOUNTER — Ambulatory Visit (HOSPITAL_COMMUNITY)
Admission: RE | Admit: 2023-01-01 | Discharge: 2023-01-01 | Disposition: A | Discharge status: Home / Self Care | Payer: Medicare HMO | Attending: Orthopedic Surgery | Admitting: Orthopedic Surgery

## 2023-01-01 DIAGNOSIS — I480 Paroxysmal atrial fibrillation: Secondary | ICD-10-CM

## 2023-01-01 DIAGNOSIS — Z87891 Personal history of nicotine dependence: Secondary | ICD-10-CM | POA: Diagnosis not present

## 2023-01-01 DIAGNOSIS — Z96651 Presence of right artificial knee joint: Secondary | ICD-10-CM | POA: Diagnosis not present

## 2023-01-01 DIAGNOSIS — R2689 Other abnormalities of gait and mobility: Secondary | ICD-10-CM | POA: Diagnosis not present

## 2023-01-01 DIAGNOSIS — G8929 Other chronic pain: Secondary | ICD-10-CM

## 2023-01-01 DIAGNOSIS — M1712 Unilateral primary osteoarthritis, left knee: Secondary | ICD-10-CM

## 2023-01-01 DIAGNOSIS — Z01818 Encounter for other preprocedural examination: Secondary | ICD-10-CM

## 2023-01-01 HISTORY — PX: TOTAL KNEE ARTHROPLASTY: SHX125

## 2023-01-01 LAB — COMPREHENSIVE METABOLIC PANEL
ALT: 25 U/L (ref 0–44)
AST: 30 U/L (ref 15–41)
Albumin: 3.6 g/dL (ref 3.5–5.0)
Alkaline Phosphatase: 73 U/L (ref 38–126)
Anion gap: 7 (ref 5–15)
BUN: 24 mg/dL — ABNORMAL HIGH (ref 8–23)
CO2: 24 mmol/L (ref 22–32)
Calcium: 8.5 mg/dL — ABNORMAL LOW (ref 8.9–10.3)
Chloride: 101 mmol/L (ref 98–111)
Creatinine, Ser: 1.03 mg/dL (ref 0.61–1.24)
GFR, Estimated: >60 mL/min (ref >60)
Glucose, Bld: 92 mg/dL (ref 70–99)
Potassium: 5.1 mmol/L (ref 3.5–5.1)
Sodium: 132 mmol/L — ABNORMAL LOW (ref 135–145)
Total Bilirubin: 1.6 mg/dL — ABNORMAL HIGH (ref <1.2)
Total Protein: 6.3 g/dL — ABNORMAL LOW (ref 6.5–8.1)

## 2023-01-01 LAB — POCT I-STAT, CHEM 8
BUN: 24 mg/dL — ABNORMAL HIGH (ref 8–23)
Calcium, Ion: 1.2 mmol/L (ref 1.15–1.40)
Chloride: 98 mmol/L (ref 98–111)
Creatinine, Ser: 1.1 mg/dL (ref 0.61–1.24)
Glucose, Bld: 118 mg/dL — ABNORMAL HIGH (ref 70–99)
HCT: 40 % (ref 39.0–52.0)
Hemoglobin: 13.6 g/dL (ref 13.0–17.0)
Potassium: 4.6 mmol/L (ref 3.5–5.1)
Sodium: 136 mmol/L (ref 135–145)
TCO2: 28 mmol/L (ref 22–32)

## 2023-01-01 LAB — CBC WITH DIFFERENTIAL/PLATELET
Abs Immature Granulocytes: 0.03 10*3/uL (ref 0.00–0.07)
Basophils Absolute: 0 10*3/uL (ref 0.0–0.1)
Basophils Relative: 1 %
Eosinophils Absolute: 0.2 10*3/uL (ref 0.0–0.5)
Eosinophils Relative: 3 %
HCT: 40.3 % (ref 39.0–52.0)
Hemoglobin: 13.8 g/dL (ref 13.0–17.0)
Immature Granulocytes: 1 %
Lymphocytes Relative: 25 %
Lymphs Abs: 1.5 10*3/uL (ref 0.7–4.0)
MCH: 35.7 pg — ABNORMAL HIGH (ref 26.0–34.0)
MCHC: 34.2 g/dL (ref 30.0–36.0)
MCV: 104.1 fL — ABNORMAL HIGH (ref 80.0–100.0)
Monocytes Absolute: 0.6 10*3/uL (ref 0.1–1.0)
Monocytes Relative: 10 %
Neutro Abs: 3.8 10*3/uL (ref 1.7–7.7)
Neutrophils Relative %: 60 %
Platelets: 152 10*3/uL (ref 150–400)
RBC: 3.87 MIL/uL — ABNORMAL LOW (ref 4.22–5.81)
RDW: 14.9 % (ref 11.5–15.5)
WBC: 6.2 10*3/uL (ref 4.0–10.5)
nRBC: 0 % (ref 0.0–0.2)

## 2023-01-01 SURGERY — ARTHROPLASTY, KNEE, TOTAL
Anesthesia: Spinal | Site: Knee | Laterality: Left

## 2023-01-01 MED ORDER — CHLORHEXIDINE GLUCONATE 0.12 % MT SOLN
15.0000 mL | Freq: Once | OROMUCOSAL | Status: AC
  Administered 2023-01-01: 15 mL via OROMUCOSAL

## 2023-01-01 MED ORDER — PHENYLEPHRINE 80 MCG/ML (10ML) SYRINGE FOR IV PUSH (FOR BLOOD PRESSURE SUPPORT)
PREFILLED_SYRINGE | INTRAVENOUS | Status: DC | PRN
  Administered 2023-01-01 (×3): 160 ug via INTRAVENOUS

## 2023-01-01 MED ORDER — LACTATED RINGERS IV SOLN
INTRAVENOUS | Status: DC
Start: 2023-01-01 — End: 2023-01-02

## 2023-01-01 MED ORDER — PHENYLEPHRINE HCL-NACL 20-0.9 MG/250ML-% IV SOLN: 0.0000 ug/min | INTRAVENOUS | Status: DC

## 2023-01-01 MED ORDER — ONDANSETRON HCL 4 MG/2ML IJ SOLN: 4.0000 mg | Freq: Once | INTRAMUSCULAR | Status: DC | PRN

## 2023-01-01 MED ORDER — BUPIVACAINE LIPOSOME 1.3 % IJ SUSP
INTRAMUSCULAR | Status: AC
  Filled 2023-01-01: qty 20

## 2023-01-01 MED ORDER — SODIUM CHLORIDE (PF) 0.9 % IJ SOLN
INTRAMUSCULAR | Status: AC
  Filled 2023-01-01: qty 20

## 2023-01-01 MED ORDER — OXYCODONE HCL 5 MG PO TABS: 5.0000 mg | ORAL_TABLET | Freq: Once | ORAL | Status: DC | PRN

## 2023-01-01 MED ORDER — EPHEDRINE SULFATE (PRESSORS) 50 MG/ML IJ SOLN
INTRAMUSCULAR | Status: DC | PRN
  Administered 2023-01-01 (×3): 10 mg via INTRAVENOUS

## 2023-01-01 MED ORDER — ORAL CARE MOUTH RINSE: 15.0000 mL | Freq: Once | OROMUCOSAL | Status: AC

## 2023-01-01 MED ORDER — METHOCARBAMOL 500 MG PO TABS: 500.0000 mg | ORAL_TABLET | Freq: Four times a day (QID) | ORAL | 0 refills | Status: DC

## 2023-01-01 MED ORDER — SODIUM CHLORIDE 0.9 % IR SOLN
Status: DC | PRN
  Administered 2023-01-01: 1000 mL

## 2023-01-01 MED ORDER — TRANEXAMIC ACID-NACL 1000-0.7 MG/100ML-% IV SOLN
1000.0000 mg | INTRAVENOUS | Status: AC
  Administered 2023-01-01: 1000 mg via INTRAVENOUS
  Filled 2023-01-01: qty 100

## 2023-01-01 MED ORDER — MIDAZOLAM HCL 2 MG/2ML IJ SOLN
INTRAMUSCULAR | Status: DC | PRN
  Administered 2023-01-01: .5 mg via INTRAVENOUS

## 2023-01-01 MED ORDER — LACTATED RINGERS IV BOLUS
250.0000 mL | Freq: Once | INTRAVENOUS | Status: AC
  Administered 2023-01-01: 250 mL via INTRAVENOUS

## 2023-01-01 MED ORDER — PHENYLEPHRINE HCL-NACL 20-0.9 MG/250ML-% IV SOLN
25.0000 ug/min | INTRAVENOUS | Status: DC
  Administered 2023-01-01: 20 ug/min via INTRAVENOUS
  Administered 2023-01-01 (×2): 25 ug/min via INTRAVENOUS
  Administered 2023-01-01: 30 ug/min via INTRAVENOUS
  Administered 2023-01-01: 40 ug/min via INTRAVENOUS
  Administered 2023-01-01 (×2): 35 ug/min via INTRAVENOUS

## 2023-01-01 MED ORDER — PROPOFOL 1000 MG/100ML IV EMUL
INTRAVENOUS | Status: AC
  Filled 2023-01-01: qty 100

## 2023-01-01 MED ORDER — ALBUMIN HUMAN 5 % IV SOLN
INTRAVENOUS | Status: AC
  Administered 2023-01-01: 12.5 g via INTRAVENOUS
  Filled 2023-01-01: qty 250

## 2023-01-01 MED ORDER — BUPIVACAINE LIPOSOME 1.3 % IJ SUSP
INTRAMUSCULAR | Status: DC | PRN
  Administered 2023-01-01: 20 mL

## 2023-01-01 MED ORDER — GABAPENTIN 300 MG PO CAPS
300.0000 mg | ORAL_CAPSULE | Freq: Once | ORAL | Status: AC
  Administered 2023-01-01: 300 mg via ORAL
  Filled 2023-01-01: qty 1

## 2023-01-01 MED ORDER — SODIUM CHLORIDE 0.9% FLUSH
INTRAVENOUS | Status: DC | PRN
  Administered 2023-01-01: 20 mL via INTRAPLEURAL

## 2023-01-01 MED ORDER — BUPIVACAINE-EPINEPHRINE 0.25% -1:200000 IJ SOLN
INTRAMUSCULAR | Status: AC
  Filled 2023-01-01: qty 1

## 2023-01-01 MED ORDER — SODIUM CHLORIDE 0.9 % IV SOLN: 250.0000 mL | INTRAVENOUS | Status: DC

## 2023-01-01 MED ORDER — DEXAMETHASONE SODIUM PHOSPHATE 10 MG/ML IJ SOLN
INTRAMUSCULAR | Status: AC
Start: 2023-01-01 — End: ?
  Filled 2023-01-01: qty 1

## 2023-01-01 MED ORDER — LIDOCAINE HCL (CARDIAC) PF 100 MG/5ML IV SOSY
PREFILLED_SYRINGE | INTRAVENOUS | Status: DC | PRN
  Administered 2023-01-01: 50 mg via INTRAVENOUS

## 2023-01-01 MED ORDER — DEXAMETHASONE SODIUM PHOSPHATE 10 MG/ML IJ SOLN
8.0000 mg | Freq: Once | INTRAMUSCULAR | Status: AC
  Administered 2023-01-01: 10 mg via INTRAVENOUS

## 2023-01-01 MED ORDER — PROPOFOL 500 MG/50ML IV EMUL
INTRAVENOUS | Status: DC | PRN
  Administered 2023-01-01: 100 ug/kg/min via INTRAVENOUS

## 2023-01-01 MED ORDER — OXYCODONE HCL 5 MG PO TABS: 5.0000 mg | ORAL_TABLET | Freq: Four times a day (QID) | ORAL | 0 refills | Status: DC | PRN

## 2023-01-01 MED ORDER — MIDAZOLAM HCL 2 MG/2ML IJ SOLN
INTRAMUSCULAR | Status: AC
  Filled 2023-01-01: qty 2

## 2023-01-01 MED ORDER — ALBUMIN HUMAN 5 % IV SOLN
12.5000 g | Freq: Once | INTRAVENOUS | Status: AC
  Administered 2023-01-01: 12.5 g via INTRAVENOUS

## 2023-01-01 MED ORDER — FENTANYL CITRATE PF 50 MCG/ML IJ SOSY
50.0000 ug | PREFILLED_SYRINGE | INTRAMUSCULAR | Status: DC
  Administered 2023-01-01: 50 ug via INTRAVENOUS
  Filled 2023-01-01: qty 2

## 2023-01-01 MED ORDER — ONDANSETRON HCL 4 MG/2ML IJ SOLN
INTRAMUSCULAR | Status: DC | PRN
  Administered 2023-01-01: 4 mg via INTRAVENOUS

## 2023-01-01 MED ORDER — ALBUMIN HUMAN 5 % IV SOLN: 12.5000 g | Freq: Once | INTRAVENOUS | Status: AC

## 2023-01-01 MED ORDER — BUPIVACAINE LIPOSOME 1.3 % IJ SUSP
20.0000 mL | Freq: Once | INTRAMUSCULAR | Status: DC
Start: 2023-01-01 — End: 2023-01-02

## 2023-01-01 MED ORDER — HYDROXYZINE HCL 10 MG PO TABS
10.0000 mg | ORAL_TABLET | Freq: Once | ORAL | Status: AC
  Administered 2023-01-01: 10 mg via ORAL
  Filled 2023-01-01: qty 1

## 2023-01-01 MED ORDER — ONDANSETRON HCL 4 MG/2ML IJ SOLN
INTRAMUSCULAR | Status: AC
  Filled 2023-01-01: qty 2

## 2023-01-01 MED ORDER — LACTATED RINGERS IV BOLUS
500.0000 mL | Freq: Once | INTRAVENOUS | Status: AC
  Administered 2023-01-01: 500 mL via INTRAVENOUS

## 2023-01-01 MED ORDER — PHENYLEPHRINE HCL-NACL 20-0.9 MG/250ML-% IV SOLN
INTRAVENOUS | Status: DC | PRN
  Administered 2023-01-01: 35 ug/min via INTRAVENOUS

## 2023-01-01 MED ORDER — OXYCODONE HCL 5 MG/5ML PO SOLN: 5.0000 mg | Freq: Once | ORAL | Status: DC | PRN

## 2023-01-01 MED ORDER — WATER FOR IRRIGATION, STERILE IR SOLN
Status: DC | PRN
  Administered 2023-01-01: 1000 mL

## 2023-01-01 MED ORDER — POVIDONE-IODINE 10 % EX SWAB
2.0000 | Freq: Once | CUTANEOUS | Status: DC
Start: 2023-01-01 — End: 2023-01-02

## 2023-01-01 MED ORDER — BUPIVACAINE IN DEXTROSE 0.75-8.25 % IT SOLN
INTRATHECAL | Status: DC | PRN
Start: 2023-01-01 — End: 2023-01-01
  Administered 2023-01-01: 1.8 mL via INTRATHECAL

## 2023-01-01 MED ORDER — DIPHENHYDRAMINE HCL 50 MG/ML IJ SOLN
12.5000 mg | Freq: Once | INTRAMUSCULAR | Status: AC
Start: 2023-01-01 — End: 2023-01-01
  Administered 2023-01-01: 12.5 mg via INTRAVENOUS

## 2023-01-01 MED ORDER — ACETAMINOPHEN 500 MG PO TABS
1000.0000 mg | ORAL_TABLET | Freq: Once | ORAL | Status: AC
  Administered 2023-01-01: 1000 mg via ORAL
  Filled 2023-01-01: qty 2

## 2023-01-01 MED ORDER — CEFAZOLIN SODIUM-DEXTROSE 2-4 GM/100ML-% IV SOLN
2.0000 g | INTRAVENOUS | Status: AC
Start: 2023-01-01 — End: 2023-01-01
  Administered 2023-01-01: 2 g via INTRAVENOUS
  Filled 2023-01-01: qty 100

## 2023-01-01 MED ORDER — DIPHENHYDRAMINE HCL 50 MG/ML IJ SOLN
INTRAMUSCULAR | Status: AC
  Filled 2023-01-01: qty 1

## 2023-01-01 MED ORDER — ACETAMINOPHEN 10 MG/ML IV SOLN: 1000.0000 mg | Freq: Once | INTRAVENOUS | Status: DC | PRN

## 2023-01-01 MED ORDER — PHENYLEPHRINE HCL-NACL 20-0.9 MG/250ML-% IV SOLN
INTRAVENOUS | Status: AC
  Administered 2023-01-01: 30 ug/min via INTRAVENOUS
  Filled 2023-01-01: qty 250

## 2023-01-01 MED ORDER — BUPIVACAINE-EPINEPHRINE 0.25% -1:200000 IJ SOLN
INTRAMUSCULAR | Status: DC | PRN
  Administered 2023-01-01: 30 mL

## 2023-01-01 MED ORDER — FENTANYL CITRATE PF 50 MCG/ML IJ SOSY: 25.0000 ug | PREFILLED_SYRINGE | INTRAMUSCULAR | Status: DC | PRN

## 2023-01-01 MED ORDER — ROPIVACAINE HCL 5 MG/ML IJ SOLN
INTRAMUSCULAR | Status: DC | PRN
Start: 2023-01-01 — End: 2023-01-01
  Administered 2023-01-01: 30 mL via PERINEURAL

## 2023-01-01 SURGICAL SUPPLY — 47 items
ARTISURF 10M PLYL 10-11EF KNEE (Knees) IMPLANT
BAG COUNTER SPONGE SURGICOUNT (BAG) IMPLANT
BAG ZIPLOCK 12X15 (MISCELLANEOUS) ×1 IMPLANT
BLADE SAGITTAL 13X1.27X60 (BLADE) ×1 IMPLANT
BLADE SAW SGTL 18X1.27X75 (BLADE) ×1 IMPLANT
BLADE SURG 15 STRL LF DISP TIS (BLADE) ×1 IMPLANT
BNDG ELASTIC 6INX 5YD STR LF (GAUZE/BANDAGES/DRESSINGS) ×1 IMPLANT
BOWL SMART MIX CTS (DISPOSABLE) ×1 IMPLANT
CEMENT BONE R 1X40 (Cement) ×2 IMPLANT
CLSR STERI-STRIP ANTIMIC 1/2X4 (GAUZE/BANDAGES/DRESSINGS) IMPLANT
COVER SURGICAL LIGHT HANDLE (MISCELLANEOUS) ×1 IMPLANT
CUFF TRNQT CYL 34X4.125X (TOURNIQUET CUFF) ×1 IMPLANT
DRAPE INCISE IOBAN 66X45 STRL (DRAPES) ×2 IMPLANT
DRAPE U-SHAPE 47X51 STRL (DRAPES) ×1 IMPLANT
DRSG AQUACEL AG ADV 3.5X10 (GAUZE/BANDAGES/DRESSINGS) ×1 IMPLANT
DURAPREP 26ML APPLICATOR (WOUND CARE) ×2 IMPLANT
ELECT REM PT RETURN 15FT ADLT (MISCELLANEOUS) ×1 IMPLANT
FEMUR CMT CCR STD SZ10 L KNEE (Knees) ×1 IMPLANT
FEMUR CMTD CR PERS STD SZ 5 RT (Knees) IMPLANT
GLOVE BIOGEL M 7.0 STRL (GLOVE) IMPLANT
GLOVE BIOGEL PI IND STRL 7.5 (GLOVE) IMPLANT
GLOVE BIOGEL PI IND STRL 8.5 (GLOVE) ×1 IMPLANT
GLOVE SURG ORTHO 8.0 STRL STRW (GLOVE) ×2 IMPLANT
GOWN STRL REUS W/ TWL XL LVL3 (GOWN DISPOSABLE) ×2 IMPLANT
HOLDER FOLEY CATH W/STRAP (MISCELLANEOUS) ×1 IMPLANT
HOOD PEEL AWAY T7 (MISCELLANEOUS) ×3 IMPLANT
KIT TURNOVER KIT A (KITS) IMPLANT
MANIFOLD NEPTUNE II (INSTRUMENTS) ×1 IMPLANT
NS IRRIG 1000ML POUR BTL (IV SOLUTION) ×1 IMPLANT
PACK TOTAL KNEE CUSTOM (KITS) ×1 IMPLANT
PROTECTOR NERVE ULNAR (MISCELLANEOUS) ×1 IMPLANT
SET HNDPC FAN SPRY TIP SCT (DISPOSABLE) ×1 IMPLANT
SPIKE FLUID TRANSFER (MISCELLANEOUS) ×2 IMPLANT
STEM POLY PAT PLY 32M KNEE (Knees) IMPLANT
STEM TIBIA 5 DEG SZ F L KNEE (Knees) IMPLANT
STRIP CLOSURE SKIN 1/2X4 (GAUZE/BANDAGES/DRESSINGS) ×1 IMPLANT
SUT BONE WAX W31G (SUTURE) ×1 IMPLANT
SUT MNCRL AB 3-0 PS2 18 (SUTURE) ×1 IMPLANT
SUT STRATAFIX 0 PDS 27 VIOLET (SUTURE) ×1
SUT STRATAFIX 1PDS 45CM VIOLET (SUTURE) ×1 IMPLANT
SUT VIC AB 1 CT1 36 (SUTURE) ×1 IMPLANT
SUTURE STRATFX 0 PDS 27 VIOLET (SUTURE) ×1 IMPLANT
TIBIA STEM 5 DEG SZ F L KNEE (Knees) ×1 IMPLANT
TRAY FOLEY MTR SLVR 16FR STAT (SET/KITS/TRAYS/PACK) ×1 IMPLANT
TUBE SUCTION HIGH CAP CLEAR NV (SUCTIONS) ×1 IMPLANT
WATER STERILE IRR 1000ML POUR (IV SOLUTION) ×2 IMPLANT
WRAP KNEE MAXI GEL POST OP (GAUZE/BANDAGES/DRESSINGS) ×1 IMPLANT

## 2023-01-01 NOTE — Progress Notes (Signed)
Orthopedic Tech Progress Note Patient Details:  Dylan Roth 12/30/1940 [id number]  Ortho Devices Type of Ortho Device: Bone foam zero knee Ortho Device/Splint Interventions: Ordered, Application   Post Interventions Patient Tolerated: Well  Darleen Crocker 01/01/2023, 4:31 PM

## 2023-01-01 NOTE — H&P (Signed)
SHAFFER CARRY MRN:  [id number] DOB/SEX:  01-Feb-1940/male  CHIEF COMPLAINT:  Painful left Knee  HISTORY: Patient is a 82 y.o. male presented with a history of pain in the left knee. Onset of symptoms was gradual starting a few years ago with gradually worsening course since that time. Patient has been treated conservatively with over-the-counter NSAIDs and activity modification. Patient currently rates pain in the knee at 10 out of 10 with activity. There is pain at night.  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   Preop cardiovascular exam 03/08/2018   HTN (hypertension) 02/10/2018   Gout 02/10/2018   CAD (coronary artery disease) 02/10/2018   Hx of CABG 02/10/2018   Hyperkalemia 02/10/2018   Chest pain 02/10/2018   Encounter for laboratory examination 09/24/2015   Hematuria, gross 01/29/2014   BPH with urinary obstruction 12/29/2013   Pacemaker-[employer] 06/05/2011   Anticoagulant long-term use 05/23/2011   Atrial fibrillation/flutter    Sinus node dysfunction (HCC)chronotropic incompetence    HYPERCHOLESTEROLEMIA 10/16/2008   Essential hypertension, benign 10/16/2008   Coronary artery disease involving native coronary artery of native heart with unstable angina pectoris (HCC) 10/16/2008   Past Medical History:  Diagnosis Date   Anemia    Angina    Arthritis    "in my back"   Atrial fibrillation/flutter    BPH (benign prostatic hyperplasia)    CAD (coronary artery disease) Dec 2005   Hx MI. 80% left main, 80% LAD, 90% ramus, 50% circ, 90% in nondominant RCA,  EF normal  2010--echo   Cataract    bilateral-removed 6-7 years ago   Central obesity    Chronic back pain    Dysrhythmia    Erectile dysfunction    GERD (gastroesophageal reflux disease)    pt. denies   Gout    "once; I have it under control w/medicine"   History of anemia    History of colon polyps    Hyperlipidemia    Hypertension     IBS (irritable bowel syndrome)    with diarrhea   Myocardial infarction (HCC)    pt. denies at preop   Pacemaker    Palpitations    Sinus node dysfunction (HCC)    post termination pauses <5sec   Past Surgical History:  Procedure Laterality Date   CARDIAC CATHETERIZATION  2005   "that what sent me to CABG"   CARDIAC CATHETERIZATION N/A 09/22/2015   Procedure: Left Heart Cath and Cors/Grafts Angiography;  Surgeon: Kathleene Hazel, MD;  Location: [facility] INVASIVE CV LAB;  Service: Cardiovascular;  Laterality: N/A;   CATARACT EXTRACTION W/ INTRAOCULAR LENS  IMPLANT, BILATERAL  ~ 01/2011   CHOLECYSTECTOMY  1978    COLONOSCOPY  2014   (Polypectomy) colonic polyps status post polypectomy. Pancolonic diverticulosis predominantly in the sigmoid colon. Small internal hemorrhoids. Patient also had perirectal excoriation   CORONARY ARTERY BYPASS GRAFT  2005   in [place] - [place] to LAD, SVG to diagonal, SVG to ramus, SVG to OM and SVG to RCA    EYE SURGERY     retina surgey right eye in [place]   PACEMAKER INSERTION  2014   PERMANENT PACEMAKER INSERTION N/A 05/25/2011   Procedure: PERMANENT PACEMAKER INSERTION;  Surgeon: Duke Salvia, MD;  Location: [facility] CATH LAB;  Service: Cardiovascular;  Laterality: N/A;   PPM GENERATOR CHANGEOUT N/A 02/23/2021   Procedure: PPM GENERATOR CHANGEOUT;  Surgeon: Duke Salvia, MD;  Location: [facility]  INVASIVE CV LAB;  Service: Cardiovascular;  Laterality: N/A;   TOTAL KNEE ARTHROPLASTY Right 12/16/2018   Procedure: TOTAL KNEE ARTHROPLASTY;  Surgeon: Dannielle Huh, MD;  Location: [facility] ORS;  Service: Orthopedics;  Laterality: Right;  75 mins needed for length of case   TRANSURETHRAL RESECTION OF PROSTATE N/A 12/29/2013   Procedure: TRANSURETHRAL RESECTION OF THE PROSTATE WITH GYRUS INSTRUMENTS;  Surgeon: Valetta Fuller, MD;  Location: [facility] ORS;  Service: Urology;  Laterality: N/A;     MEDICATIONS:   Medications Prior to Admission  Medication Sig Dispense Refill  Last Dose   allopurinol (ZYLOPRIM) 300 MG tablet Take 300 mg by mouth daily.   01/01/2023 at 0430   atorvastatin (LIPITOR) 80 MG tablet Take 80 mg by mouth at bedtime.   12/31/2022   carvedilol (COREG) 3.125 MG tablet TAKE 1 TABLET TWICE DAILY WITH MEALS 180 tablet 3 01/01/2023 at 0430   ELIQUIS 5 MG TABS tablet TAKE 1 TABLET TWICE DAILY 180 tablet 3 12/28/2022   escitalopram (LEXAPRO) 10 MG tablet Take 10 mg by mouth daily with supper.   12/31/2022   isosorbide mononitrate (IMDUR) 30 MG 24 hr tablet TAKE 1 TABLET TWICE DAILY 180 tablet 3 01/01/2023   lisinopril (ZESTRIL) 5 MG tablet Take 2.5 mg by mouth daily.   12/31/2022   nitroGLYCERIN (NITROSTAT) 0.4 MG SL tablet Place 1 tablet (0.4 mg total) under the tongue every 5 (five) minutes as needed for chest pain. DO NOT EXCEED A TOTAL OF 3 DOSES IN 15 MINUTES 75 tablet 1    ranolazine (RANEXA) 500 MG 12 hr tablet TAKE 1 TABLET TWICE DAILY 180 tablet 3 01/01/2023 at 0430   sotalol (BETAPACE) 120 MG tablet TAKE 1 TABLET EVERY 12 HOURS (PLEASE KEEP UPCOMING APPOINTMENT FOR FUTURE REFILLS. THANK YOU) 180 tablet 3 01/01/2023   methocarbamol (ROBAXIN) 500 MG tablet Take 1-2 tablets (500-1,000 mg total) by mouth every 6 (six) hours as needed for muscle spasms. (Patient not taking: Reported on 12/18/2022) 60 tablet 0 Not Taking    ALLERGIES:   Allergies  Allergen Reactions   Sulfa Antibiotics Other (See Comments)    SWELLING ON PATIENTS LIPS, HE GETS DRIED UP   Sulfamethoxazole Other (See Comments)    Unknown   Sulfamethoxazole-Trimethoprim Other (See Comments)    Unknown    REVIEW OF SYSTEMS:  A comprehensive review of systems was negative except for: Musculoskeletal: positive for arthralgias and bone pain   FAMILY HISTORY:   Family History  Problem Relation Age of Onset   Cancer Mother    Stroke Brother    Heart attack Neg Hx    Colon cancer Neg Hx    Colon polyps Neg Hx    Heart disease Neg Hx    Esophageal cancer Neg Hx    Rectal cancer Neg  Hx    Stomach cancer Neg Hx     SOCIAL HISTORY:   Social History   Tobacco Use   Smoking status: Former    Current packs/day: 0.00    Types: Cigarettes    Quit date: 01/30/1961    Years since quitting: 61.9   Smokeless tobacco: Never  Substance Use Topics   Alcohol use: No    Comment: 2 drinks (bourbon) a night     EXAMINATION:  Vital signs in last 24 hours: Temp:  [97.8 F (36.6 C)] 97.8 F (36.6 C) (12/02 0549) Pulse Rate:  [60] 60 (12/02 0549) Resp:  [15] 15 (12/02 0549) BP: (141)/(85) 141/85 (12/02 0549) SpO2:  [100 %]  100 % (12/02 0549) Weight:  [84.4 kg] 84.4 kg (12/02 0547)  BP (!) 141/85 (BP Location: Right Arm)   Pulse 60   Temp 97.8 F (36.6 C) (Temporal)   Resp 15   Ht 5\' 6"  (1.676 m)   Wt 84.4 kg   SpO2 100%   BMI 30.02 kg/m   General Appearance:    Alert, cooperative, no distress, appears stated age  Head:    Normocephalic, without obvious abnormality, atraumatic  Eyes:    PERRL, conjunctiva/corneas clear, EOM's intact, fundi    benign, both eyes       Ears:    Normal TM's and external ear canals, both ears  Nose:   Nares normal, septum midline, mucosa normal, no drainage    or sinus tenderness  Throat:   Lips, mucosa, and tongue normal; teeth and gums normal  Neck:   Supple, symmetrical, trachea midline, no adenopathy;       thyroid:  No enlargement/tenderness/nodules; no carotid   bruit or JVD  Back:     Symmetric, no curvature, ROM normal, no CVA tenderness  Lungs:     Clear to auscultation bilaterally, respirations unlabored  Chest wall:    No tenderness or deformity  Heart:    Regular rate and rhythm, S1 and S2 normal, no murmur, rub   or gallop  Abdomen:     Soft, non-tender, bowel sounds active all four quadrants,    no masses, no organomegaly  Genitalia:    Normal male without lesion, discharge or tenderness  Rectal:    Normal tone, normal prostate, no masses or tenderness;   guaiac negative stool  Extremities:   Extremities normal,  atraumatic, no cyanosis or edema  Pulses:   2+ and symmetric all extremities  Skin:   Skin color, texture, turgor normal, no rashes or lesions  Lymph nodes:   Cervical, supraclavicular, and axillary nodes normal  Neurologic:   CNII-XII intact. Normal strength, sensation and reflexes      throughout    Musculoskeletal:  ROM 0-120, Ligaments intact,  Imaging Review Plain radiographs demonstrate severe degenerative joint disease of the left knee. The overall alignment is neutral. The bone quality appears to be good for age and reported activity level.  Assessment/Plan: Primary osteoarthritis, left knee   The patient history, physical examination and imaging studies are consistent with advanced degenerative joint disease of the left knee. The patient has failed conservative treatment.  The clearance notes were reviewed.  After discussion with the patient it was felt that Total Knee Replacement was indicated. The procedure,  risks, and benefits of total knee arthroplasty were presented and reviewed. The risks including but not limited to aseptic loosening, infection, blood clots, vascular injury, stiffness, patella tracking problems complications among others were discussed. The patient acknowledged the explanation, agreed to proceed with the plan.  Preoperative templating of the joint replacement has been completed, documented, and submitted to the Operating Room personnel in order to optimize intra-operative equipment management.    Patient's anticipated LOS is less than 2 midnights, meeting these requirements: - Lives within 1 hour of care - Has a competent adult at home to recover with post-op recover - NO history of  - Chronic pain requiring opiods  - Diabetes  - Coronary Artery Disease  - Heart failure  - Heart attack  - Stroke  - DVT/VTE  - Cardiac arrhythmia  - Respiratory Failure/COPD  - Renal failure  - Anemia  - Advanced Liver disease  Guy Sandifer 01/01/2023,  7:03 AM

## 2023-01-01 NOTE — Anesthesia Procedure Notes (Signed)
Spinal  Patient location during procedure: OR Start time: 01/01/2023 8:40 AM End time: 01/01/2023 8:45 AM Reason for block: surgical anesthesia Staffing Performed: anesthesiologist  Anesthesiologist: Mariann Barter, MD Performed by: Mariann Barter, MD Authorized by: Mariann Barter, MD   Preanesthetic Checklist Completed: patient identified, IV checked, site marked, risks and benefits discussed, surgical consent, monitors and equipment checked, pre-op evaluation and timeout performed Spinal Block Patient position: sitting Prep: DuraPrep Patient monitoring: heart rate, cardiac monitor, continuous pulse ox and blood pressure Approach: midline Location: L3-4 Injection technique: single-shot Needle Needle type: Sprotte  Needle gauge: 24 G Needle length: 9 cm Assessment Sensory level: T4 Events: CSF return

## 2023-01-01 NOTE — Transfer of Care (Signed)
Immediate Anesthesia Transfer of Care Note  Patient: Dylan Roth  Procedure(s) Performed: TOTAL KNEE ARTHROPLASTY (Left: Knee)  Patient Location: PACU  Anesthesia Type:Spinal  Level of Consciousness: awake, alert , and oriented  Airway & Oxygen Therapy: Patient Spontanous Breathing and Patient connected to face mask oxygen  Post-op Assessment: Report given to RN and Post -op Vital signs reviewed and stable  Post vital signs: stable  Last Vitals:  Vitals Value Taken Time  BP    Temp    Pulse    Resp    SpO2      Last Pain:  Vitals:   01/01/23 0752  TempSrc:   PainSc: 0-No pain         Complications: No notable events documented.

## 2023-01-01 NOTE — Anesthesia Procedure Notes (Signed)
Anesthesia Regional Block: Adductor canal block   Pre-Anesthetic Checklist: , timeout performed,  Correct Patient, Correct Site, Correct Laterality,  Correct Procedure, Correct Position, site marked,  Risks and benefits discussed,  Surgical consent,  Pre-op evaluation,  At surgeon's request and post-op pain management  Laterality: Left  Prep: Maximum Sterile Barrier Precautions used, chloraprep       Needles:  Injection technique: Single-shot  Needle Type: Echogenic Needle      Needle Gauge: 20     Additional Needles:   Procedures:,,,, ultrasound used (permanent image in chart),,    Narrative:  Start time: 01/01/2023 7:45 AM End time: 01/01/2023 7:50 AM Injection made incrementally with aspirations every 5 mL.  Performed by: Personally  Anesthesiologist: Mariann Barter, MD

## 2023-01-01 NOTE — Anesthesia Postprocedure Evaluation (Signed)
Anesthesia Post Note  Patient: Dylan Roth  Procedure(s) Performed: TOTAL KNEE ARTHROPLASTY (Left: Knee)     Anesthesia Type: Spinal Anesthetic complications: no   No notable events documented.  Last Vitals:  Vitals:   01/01/23 0820 01/01/23 0821  BP: 127/71   Pulse: 60 60  Resp: (!) 22 17  Temp:    SpO2: 100% 100%    Last Pain:  Vitals:   01/01/23 0752  TempSrc:   PainSc: 0-No pain                 Quincy Carnes

## 2023-01-01 NOTE — Evaluation (Addendum)
Physical Therapy Evaluation Patient Details Name: Dylan Roth MRN: [id number] DOB: 1940/11/11 Today's Date: 01/01/2023  History of Present Illness  82 yo male presents to therapy s/p L TKA on 01/01/2023 due to failure of conservative measures. Pt PMH includes but is not limited to: HTN, gout, CAD, s/p CABG, pacemaker, GERD, MI and R TKA (2020).  Clinical Impression      Dylan Roth is a 82 y.o. male POD 0 s/p L TKA. Patient reports IND with mobility at baseline. Patient is now limited by functional impairments (see PT problem list below) and requires CGA and cues for transfers and gait with RW. Patient was able to ambulate 45 and 15 feet with RW and CGA and cues for safe walker management. Pt reports leg length discrepancy at baseline and typically has lift in L shoe. Patient educated on safe sequencing for stair mobility with one handrail and HHA, fall risk prevention, pain management and goal, use of iceman machine, car transfers pt  and family  verbalized understanding of  safe guarding position for people assisting with mobility. Patient instructed in exercises to facilitate ROM and circulation reviewed and HO provided. Patient will benefit from continued skilled PT interventions to address impairments and progress towards PLOF. Patient has met mobility goals at adequate level for discharge home with family support and OPPT services; will continue to follow if pt continues acute stay to progress towards Mod I goals.     If plan is discharge home, recommend the following: A little help with walking and/or transfers;A little help with bathing/dressing/bathroom;Assistance with cooking/housework;Assist for transportation;Help with stairs or ramp for entrance   Can travel by private vehicle        Equipment Recommendations None recommended by PT  Recommendations for Other Services       Functional Status Assessment Patient has had a recent decline in their functional status and  demonstrates the ability to make significant improvements in function in a reasonable and predictable amount of time.     Precautions / Restrictions Precautions Precautions: Knee;Fall Restrictions Weight Bearing Restrictions: No      Mobility  Bed Mobility Overal bed mobility: Needs Assistance Bed Mobility: Supine to Sit     Supine to sit: Supervision, HOB elevated     General bed mobility comments: min cues    Transfers Overall transfer level: Needs assistance Equipment used: Rolling walker (2 wheels) Transfers: Sit to/from Stand Sit to Stand: Contact guard assist           General transfer comment: min cues    Ambulation/Gait Ambulation/Gait assistance: Contact guard assist Gait Distance (Feet): 45 Feet Assistive device: Rolling walker (2 wheels) Gait Pattern/deviations: Step-to pattern, Antalgic, Trunk flexed Gait velocity: decreased     General Gait Details: min cues for safety and sequencing, maintaing RW on floor and step to pattern  Stairs Stairs: Yes Stairs assistance: Contact guard assist Stair Management: Two rails Number of Stairs: 2 General stair comments: step navigation training with B handrail, CGA and cues for sequencing and technique and then pt progressed to R handrail and L HHA. family verbalized understanding of RW placement and recommendation for HHA  Wheelchair Mobility     Tilt Bed    Modified Rankin (Stroke Patients Only)       Balance Overall balance assessment: Needs assistance, History of Falls (3 falls past 6 months) Sitting-balance support: Feet unsupported Sitting balance-Leahy Scale: Good     Standing balance support: Bilateral upper extremity supported, During functional activity, Reliant  on assistive device for balance Standing balance-Leahy Scale: Poor                               Pertinent Vitals/Pain Pain Assessment Pain Assessment: 0-10 Pain Score: 2  Pain Location: L knee Pain Descriptors  / Indicators: Aching, Constant, Discomfort, Dull, Operative site guarding Pain Intervention(s): Limited activity within patient's tolerance, Monitored during session, Premedicated before session, Repositioned, Patient requesting pain meds-RN notified, Ice applied    Home Living Family/patient expects to be discharged to:: Private residence Living Arrangements: Alone Available Help at Discharge: Family (daughter and son in law will help at time of d/c) Type of Home: House Home Access: Stairs to enter Entrance Stairs-Rails:  (center) Entrance Stairs-Number of Steps: 4   Home Layout: One level Home Equipment: Agricultural consultant (2 wheels);Cane - single point Additional Comments: ice man machine, CPM and bed rail    Prior Function Prior Level of Function : Independent/Modified Independent             Mobility Comments: occational use of SPC on uneven surfaces, mod I for all ADLs, self care tasks and IADLs       Extremity/Trunk Assessment        Lower Extremity Assessment Lower Extremity Assessment: LLE deficits/detail LLE Deficits / Details: ankle DF/PF 5/5; SLR < 10 degree lag LLE Sensation: WNL    Cervical / Trunk Assessment Cervical / Trunk Assessment: Normal  Communication   Communication Communication: No apparent difficulties  Cognition Arousal: Alert Behavior During Therapy: WFL for tasks assessed/performed Overall Cognitive Status: Within Functional Limits for tasks assessed                                          General Comments      Exercises Total Joint Exercises Ankle Circles/Pumps: AROM, Both, 10 reps Quad Sets: AROM, Left, 5 reps Short Arc Quad: AROM, Left, 5 reps Heel Slides: AROM, Left, 5 reps Hip ABduction/ADduction: AROM, Left, 5 reps Straight Leg Raises: AROM, Left, 5 reps Knee Flexion: AROM, Left, 5 reps, Seated   Assessment/Plan    PT Assessment Patient needs continued PT services  PT Problem List Decreased  strength;Decreased range of motion;Decreased activity tolerance;Decreased balance;Decreased mobility;Decreased coordination;Pain       PT Treatment Interventions DME instruction;Gait training;Stair training;Functional mobility training;Therapeutic exercise;Therapeutic activities;Balance training;Neuromuscular re-education;Patient/family education;Modalities    PT Goals (Current goals can be found in the Care Plan section)  Acute Rehab PT Goals Patient Stated Goal: to be able to do yard work PT Goal Formulation: With patient Time For Goal Achievement: 01/15/23 Potential to Achieve Goals: Good    Frequency 7X/week     Co-evaluation               AM-PAC PT "6 Clicks" Mobility  Outcome Measure Help needed turning from your back to your side while in a flat bed without using bedrails?: A Little Help needed moving from lying on your back to sitting on the side of a flat bed without using bedrails?: A Little Help needed moving to and from a bed to a chair (including a wheelchair)?: A Little Help needed standing up from a chair using your arms (e.g., wheelchair or bedside chair)?: A Little Help needed to walk in hospital room?: A Little Help needed climbing 3-5 steps with a railing? : A Little 6  Click Score: 18    End of Session Equipment Utilized During Treatment: Gait belt Activity Tolerance: Patient tolerated treatment well;No increased pain Patient left: in chair;with call bell/phone within reach;with family/visitor present Nurse Communication: Mobility status;Other (comment) (pt readiness for d/c from PT standpoint) PT Visit Diagnosis: Unsteadiness on feet (R26.81);Other abnormalities of gait and mobility (R26.89);Repeated falls (R29.6);Muscle weakness (generalized) (M62.81);History of falling (Z91.81);Difficulty in walking, not elsewhere classified (R26.2);Pain Pain - Right/Left: Left Pain - part of body: Knee;Leg    Time: 8295-6213 PT Time Calculation (min) (ACUTE ONLY):  47 min   Charges:   PT Evaluation $PT Eval Low Complexity: 1 Low PT Treatments $Gait Training: 8-22 mins $Therapeutic Exercise: 8-22 mins PT General Charges $$ ACUTE PT VISIT: 1 Visit         Johnny Bridge, PT Acute Rehab   Jacqualyn Posey 01/01/2023, 5:35 PM

## 2023-01-01 NOTE — Op Note (Signed)
TOTAL KNEE REPLACEMENT OPERATIVE NOTE:  01/01/2023  11:45 AM  PATIENT:  Dylan Roth  82 y.o. male  PRE-OPERATIVE DIAGNOSIS:  Osteoarthritis lt knee  POST-OPERATIVE DIAGNOSIS:  Osteoarthritis lt knee  PROCEDURE:  Procedure(s): TOTAL KNEE ARTHROPLASTY  SURGEON:  Surgeon(s): Dannielle Huh, MD  PHYSICIAN ASSISTANT: Skip Mayer, PA-C  ANESTHESIA:   spinal  SPECIMEN: None  COUNTS:  Correct  TOURNIQUET:   Total Tourniquet Time Documented: Thigh (Left) - 45 minutes Total: Thigh (Left) - 45 minutes   DICTATION:  Indication for procedure:    The patient is a 82 y.o. male who has failed conservative treatment for Osteoarthritis lt knee.  Informed consent was obtained prior to anesthesia. The risks versus benefits of the operation were explain and in a way the patient can, and did, understand.     Description of procedure:     The patient was taken to the operating room and placed under anesthesia.  The patient was positioned in the usual fashion taking care that all body parts were adequately padded and/or protected.  A tourniquet was applied and the leg prepped and draped in the usual sterile fashion.  The extremity was exsanguinated with the esmarch and tourniquet inflated to 300 mmHg.    A midline incision approximately 6-7 inches long was made with a #10 blade.  A new blade was used to make a parapatellar arthrotomy going 2-3 cm into the quadriceps tendon, over the patella, and alongside the medial aspect of the patellar tendon.  A synovectomy was then performed with the #10 blade and forceps. I then elevated the deep MCL off the medial tibial metaphysis subperiosteally around to the semimembranosus attachment.    I everted the patella and used calipers to measure patellar thickness.  I used the reamer to ream down to appropriate thickness to recreate the native thickness.  I then removed excess bone with the rongeur and sagittal saw.  I used the appropriately sized  template and drilled the three lug holes.  I then put the trial in place and measured the thickness with the calipers to ensure recreation of the native thickness.  The trial was then removed and the patella subluxed and the knee brought into flexion.  A homan retractor was place to retract and protect the patella and lateral structures.  A Z-retractor was place medially to protect the medial structures.  The extra-medullary alignment system was used to make cut the tibial articular surface perpendicular to the anamotic axis of the tibia and in 3 degrees of posterior slope.  The cut surface and alignment jig was removed.  I then used the intramedullary alignment guide to make a valgus cut on the distal femur.  I then marked out the epicondylar axis on the distal femur.   then used the anterior referencing sizer and measured the femur to be a size 10.  The 4-In-1 cutting block was screwed into place in external rotation matching the posterior condylar angle, making our cuts perpendicular to the epicondylar axis.  Anterior, posterior and chamfer cuts were made with the sagittal saw.  The cutting block and cut pieces were removed.  A lamina spreader was placed in 90 degrees of flexion.  The ACL, PCL, menisci, and posterior condylar osteophytes were removed.  A 10 mm spacer blocked was found to offer good flexion and extension gap balance after minimal in degree releasing.   The scoop retractor was then placed and the femoral finishing block was pinned in place.  The small sagittal  saw was used as well as the lug drill to finish the femur.  The block and cut surfaces were removed and the medullary canal hole filled with autograft bone from the cut pieces.  The tibia was delivered forward in deep flexion and external rotation.  A size F tray was selected and pinned into place centered on the medial 1/3 of the tibial tubercle.  The reamer and keel was used to prepare the tibia through the tray.    I then trialed  with the size 10 femur, size F tibia, a 10 mm insert and the 32 patella.  I had excellent flexion/extension gap balance, excellent patella tracking.  Flexion was full and beyond 120 degrees; extension was zero.  These components were chosen and the staff opened them to me on the back table while the knee was lavaged copiously and the cement mixed.  The soft tissue was infiltrated with 60cc of exparel 1.3% through a 21 gauge needle.  I cemented in the components and removed all excess cement.  The polyethylene tibial component was snapped into place and the knee placed in extension while cement was hardening.  The capsule was infilltrated with a 60cc exparel/marcaine/saline mixture.   Once the cement was hard, the tourniquet was let down.  Hemostasis was obtained.  The arthrotomy was closed using a #1 stratofix running suture.  The deep soft tissues were closed with #0 vicryls and the subcuticular layer closed with #2-0 vicryl.  The skin was reapproximated and closed with 3.0 Monocryl.  The wound was covered with steristrips, aquacel dressing, and a TED stocking.   The patient was then awakened, extubated, and taken to the recovery room in stable condition.  BLOOD LOSS:  300cc COMPLICATIONS:  None.  PLAN OF CARE: Discharge to home after PACU  PATIENT DISPOSITION:  PACU - hemodynamically stable.   Delay start of Pharmacological VTE agent (>24hrs) due to surgical blood loss or risk of bleeding:  yes  Please fax a copy of this op note to my office at [phone] (please only include page 1 and 2 of the Case Information op note)

## 2023-01-02 ENCOUNTER — Encounter (HOSPITAL_COMMUNITY): Payer: Self-pay | Admitting: Orthopedic Surgery

## 2023-01-22 ENCOUNTER — Other Ambulatory Visit: Payer: Self-pay

## 2023-01-22 MED ORDER — NITROGLYCERIN 0.4 MG SL SUBL: 0.4000 mg | SUBLINGUAL_TABLET | SUBLINGUAL | 2 refills | Status: DC | PRN

## 2023-02-19 LAB — CUP PACEART REMOTE DEVICE CHECK
Battery Remaining Longevity: 89 mo
Battery Remaining Percentage: 80 %
Battery Voltage: 3.01 V
Brady Statistic AP VP Percent: 96 %
Brady Statistic AP VS Percent: 1 %
Brady Statistic AS VP Percent: 3.5 %
Brady Statistic AS VS Percent: 1 %
Brady Statistic RA Percent Paced: 90 %
Brady Statistic RV Percent Paced: 99 %
Date Time Interrogation Session: 20250120055146
Implantable Lead Connection Status: 753985
Implantable Lead Connection Status: 753985
Implantable Lead Implant Date: 20130425
Implantable Lead Implant Date: 20130425
Implantable Lead Location: 753859
Implantable Lead Location: 753860
Implantable Pulse Generator Implant Date: 20230125
Lead Channel Impedance Value: 360 Ohm
Lead Channel Impedance Value: 360 Ohm
Lead Channel Pacing Threshold Amplitude: 0.5 V
Lead Channel Pacing Threshold Amplitude: 0.875 V
Lead Channel Pacing Threshold Pulse Width: 0.4 ms
Lead Channel Pacing Threshold Pulse Width: 0.4 ms
Lead Channel Sensing Intrinsic Amplitude: 3 mV
Lead Channel Sensing Intrinsic Amplitude: 3.9 mV
Lead Channel Setting Pacing Amplitude: 1.125
Lead Channel Setting Pacing Amplitude: 1.5 V
Lead Channel Setting Pacing Pulse Width: 0.4 ms
Lead Channel Setting Sensing Sensitivity: 0.5 mV
Pulse Gen Model: 2272
Pulse Gen Serial Number: 3997722

## 2023-02-21 ENCOUNTER — Ambulatory Visit: Payer: Medicare HMO

## 2023-02-21 DIAGNOSIS — I495 Sick sinus syndrome: Secondary | ICD-10-CM

## 2023-03-05 ENCOUNTER — Other Ambulatory Visit: Payer: Self-pay | Admitting: Cardiovascular Disease

## 2023-03-28 ENCOUNTER — Encounter: Payer: Self-pay | Admitting: Internal Medicine

## 2023-03-30 ENCOUNTER — Encounter: Payer: Self-pay | Admitting: Cardiovascular Disease

## 2023-03-30 ENCOUNTER — Other Ambulatory Visit: Payer: Self-pay | Admitting: Cardiovascular Disease

## 2023-04-01 NOTE — Progress Notes (Unsigned)
 Cardiology Office Note    Patient Name: Dylan Roth Date of Encounter: 04/01/2023  Primary Care Provider:  Raynelle Jan., MD Primary Cardiologist:  Verne Carrow, MD Primary Electrophysiologist: Sherryl Manges, MD   Past Medical History    Past Medical History:  Diagnosis Date   Anemia    Angina    Arthritis    "in my back"   Atrial fibrillation/flutter    BPH (benign prostatic hyperplasia)    CAD (coronary artery disease) Dec 2005   Hx MI. 80% left main, 80% LAD, 90% ramus, 50% circ, 90% in nondominant RCA,  EF normal  2010--echo   Cataract    bilateral-removed 6-7 years ago   Central obesity    Chronic back pain    Dysrhythmia    Erectile dysfunction    GERD (gastroesophageal reflux disease)    pt. denies   Gout    "once; I have it under control w/medicine"   History of anemia    History of colon polyps    Hyperlipidemia    Hypertension    IBS (irritable bowel syndrome)    with diarrhea   Myocardial infarction (HCC)    pt. denies at preop   Pacemaker    Palpitations    Sinus node dysfunction (HCC)    post termination pauses <5sec    History of Present Illness  Dylan Roth is a 83 y.o. 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  [name] has cardiac history again for CABG x 5 in 2005 and was seen initially by Dr. Clifton James in 2012 when he was diagnosed with atrial flutter by EKG.  He was started on Cardizem and Xarelto.  He wore an event monitor which showed post termination pauses of 4 seconds with Cardizem held and carvedilol decreased.  He was referred to EP and seen by Dr. Graciela Husbands in 2013 and was evaluated for AF by Dr. Johney Frame with plan for dofetilide admission and during hospitalization experienced greater than 5-second pause and underwent placement of dual-chamber PPM by Dr. Graciela Husbands on 05/25/2011.  He had  to discontinue Tikosyn due to cost and was seen in follow-up on 08/2015 with complaints of sharp chest pain.  He underwent LHC on 09/22/2015 that showed patent grafts with difficulty visualizing the other 2 grafts and cardiac CTA discussed however refused due to claustrophobia.  He was started on Ranexa and admitted on 01/2018 with chest pain and underwent 2D echo that showed EF of 65 to 70% with thickened leaflets but no aortic stenosis and mild to moderate MR.  He was last seen by Dr. Clifton James on 11/15/2022 following left TKR.  Visit patient denied any chest pain or angina and was granted clearance for right TKR which was completed on 01/01/2023.  He contacted our office on 03/30/2023 with complaint of fatigue and trouble catching his breath.  [name] presents with fatigue and shortness of breath. The patient reports a recent cold with significant phlegm production and postnasal drip, which has been treated with Flonase and occasional Nyquil. 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  His blood pressure today was stable at 124/72 and heart rate was 80 bpm.  The patient also reports shortness of breath, particularly noticeable when moving from one room to another. The patient  was previously prescribed prednisone for these symptoms but had an allergic reaction. The patient also has a history of detached retina and is due for an eye appointment. Patient denies chest pain, palpitations, dyspnea, PND, orthopnea, nausea, vomiting, dizziness, syncope, edema, weight gain, or early satiety.   Review of Systems  Please see the history of present illness.    All other systems reviewed and are otherwise negative except as noted above.  Physical Exam    Wt Readings from Last 3 Encounters:  01/01/23 186 lb (84.4 kg)  12/22/22 186 lb (84.4 kg)  11/15/22 193 lb 6.4 oz (87.7 kg)   XB:JYNWG were no vitals filed for this  visit.,There is no height or weight on file to calculate BMI. GEN: Well nourished, well developed in no acute distress Neck: No JVD; No carotid bruits Pulmonary: Clear to auscultation on the left with some consolidation on the right without rales, wheezing or rhonchi  Cardiovascular: Normal rate. Regular rhythm. Normal S1. Normal S2.   Murmurs: There is no murmur.  ABDOMEN: Soft, non-tender, non-distended EXTREMITIES:  No edema; No deformity   EKG/LABS/ Recent Cardiac Studies   ECG personally reviewed by me today -none completed today  Risk Assessment/Calculations:    CHA2DS2-VASc Score = 4   This indicates a 4.8% annual risk of stroke. The patient's score is based upon: CHF History: 0 HTN History: 1 Diabetes History: 0 Stroke History: 0 Vascular Disease History: 1 Age Score: 2 Gender Score: 0         Lab Results  Component Value Date   WBC 6.2 01/01/2023   HGB 13.6 01/01/2023   HCT 40.0 01/01/2023   MCV 104.1 (H) 01/01/2023   PLT 152 01/01/2023   Lab Results  Component Value Date   CREATININE 1.10 01/01/2023   BUN 24 (H) 01/01/2023   NA 136 01/01/2023   K 4.6 01/01/2023   CL 98 01/01/2023   CO2 24 01/01/2023   Lab Results  Component Value Date   CHOL 183 02/11/2018   HDL 35 (L) 02/11/2018   LDLCALC 118 (H) 02/11/2018   TRIG 152 (H) 02/11/2018   CHOLHDL 5.2 02/11/2018    Lab Results  Component Value Date   HGBA1C 6.2 (H) 02/11/2018   Assessment & Plan    1.  Coronary artery disease: -s/p CABG times 05/2003 with last LHC completed in 2017   showed 3 patent grafts with difficulty visualizing the other 2 grafts -Patient reports no chest pain or angina since previous follow-up. -Continue current GDMT with Lipitor 80 mg, 30 mg twice daily, 500 mg twice daily ,carvedilol 3.125 mg twice daily Nitrostat 0.4 mg as needed  2.  Paroxysmal AF: -Rate controlled at 83 bpm -Continue carvedilol 3.125 mg twice daily and sotalol 120 mg -Patient's last hemoglobin was  13.6 and creatinine was 1.1 -Eliquis 5 mg twice daily  3.  Sinus node dysfunction: -Dual-chamber PPM with normal histogram per Paceart report -Currently followed by Dr. Graciela Husbands  4.  Shortness of breath and fatigue: -Patient reports fatigue, cough, and significant phlegm production, likely secondary to a recent cold. No signs of pneumonia or heart failure. Decreased breath sounds on the right side.   -Order chest x-ray to rule out pneumonia or fluid in the lungs.   -Continue Flonase daily.   -Start Mucinex (blue box) to thin out phlegm.   -Consider over-the-counter Zyrtec or Claritin to help with postnasal drip.  5.  Primary HTN: -Patient's blood pressure today is stable at 124/72 -Continue carvedilol  3.125 mg twice daily and lisinopril 5 mg daily  6.  Hyperlipidemia: -Patient's last LDL cholesterol was 41  -Continue Lipitor 80 mg daily  Disposition: Follow-up with Verne Carrow, MD or APP in 6 months    Signed, Napoleon Form, Leodis Rains, NP 04/01/2023, 2:00 PM [facility]

## 2023-04-02 ENCOUNTER — Encounter: Payer: Self-pay | Admitting: Nurse Practitioner

## 2023-04-02 ENCOUNTER — Ambulatory Visit: Payer: Medicare HMO | Attending: Nurse Practitioner | Admitting: Nurse Practitioner

## 2023-04-02 VITALS — BP 124/72 | HR 83 | Ht 66.0 in | Wt 190.6 lb

## 2023-04-02 DIAGNOSIS — R0602 Shortness of breath: Secondary | ICD-10-CM

## 2023-04-02 DIAGNOSIS — I480 Paroxysmal atrial fibrillation: Secondary | ICD-10-CM

## 2023-04-02 DIAGNOSIS — I10 Essential (primary) hypertension: Secondary | ICD-10-CM

## 2023-04-02 DIAGNOSIS — I495 Sick sinus syndrome: Secondary | ICD-10-CM | POA: Diagnosis not present

## 2023-04-02 DIAGNOSIS — R5383 Other fatigue: Secondary | ICD-10-CM

## 2023-04-02 DIAGNOSIS — I25119 Atherosclerotic heart disease of native coronary artery with unspecified angina pectoris: Secondary | ICD-10-CM

## 2023-04-02 DIAGNOSIS — E7800 Pure hypercholesterolemia, unspecified: Secondary | ICD-10-CM

## 2023-04-02 NOTE — Patient Instructions (Signed)
 Follow-Up: At [facility], you and your health needs are our priority.  As part of our continuing mission to provide you with exceptional heart care, we have created designated Provider Care Teams.  These Care Teams include your primary Cardiologist (physician) and Advanced Practice Providers (APPs -  Physician Assistants and Nurse Practitioners) who all work together to provide you with the care you need, when you need it.  We recommend signing up for the patient portal called "MyChart".  Sign up information is provided on this After Visit Summary.  MyChart is used to connect with patients for Virtual Visits (Telemedicine).  Patients are able to view lab/test results, encounter notes, upcoming appointments, etc.  Non-urgent messages can be sent to your provider as well.   To learn more about what you can do with MyChart, go to [email or web address].    Your next appointment:   6 months  Provider:   Clifton James, MD  Other Instructions   1st Floor: - Lobby - Registration  - Pharmacy  - Lab - Cafe  2nd Floor: - PV Lab - Diagnostic Testing (echo, CT, nuclear med)  3rd Floor: - Vacant  4th Floor: - TCTS (cardiothoracic surgery) - AFib Clinic - Structural Heart Clinic - Vascular Surgery  - Vascular Ultrasound  5th Floor: - HeartCare Cardiology (general and EP) - Clinical Pharmacy for coumadin, hypertension, lipid, weight-loss medications, and med management appointments    Valet parking services will be available as well.

## 2023-04-05 NOTE — Progress Notes (Signed)
 Remote pacemaker transmission.

## 2023-05-23 ENCOUNTER — Ambulatory Visit (INDEPENDENT_AMBULATORY_CARE_PROVIDER_SITE_OTHER): Payer: Medicare HMO

## 2023-05-23 DIAGNOSIS — I495 Sick sinus syndrome: Secondary | ICD-10-CM | POA: Diagnosis not present

## 2023-05-24 LAB — CUP PACEART REMOTE DEVICE CHECK
Battery Remaining Longevity: 86 mo
Battery Remaining Percentage: 77 %
Battery Voltage: 3.01 V
Brady Statistic AP VP Percent: 96 %
Brady Statistic AP VS Percent: 1 %
Brady Statistic AS VP Percent: 3.2 %
Brady Statistic AS VS Percent: 1 %
Brady Statistic RA Percent Paced: 88 %
Brady Statistic RV Percent Paced: 99 %
Date Time Interrogation Session: 20250423033105
Implantable Lead Connection Status: 753985
Implantable Lead Connection Status: 753985
Implantable Lead Implant Date: 20130425
Implantable Lead Implant Date: 20130425
Implantable Lead Location: 753859
Implantable Lead Location: 753860
Implantable Pulse Generator Implant Date: 20230125
Lead Channel Impedance Value: 360 Ohm
Lead Channel Impedance Value: 380 Ohm
Lead Channel Pacing Threshold Amplitude: 0.5 V
Lead Channel Pacing Threshold Amplitude: 0.875 V
Lead Channel Pacing Threshold Pulse Width: 0.4 ms
Lead Channel Pacing Threshold Pulse Width: 0.4 ms
Lead Channel Sensing Intrinsic Amplitude: 4.1 mV
Lead Channel Sensing Intrinsic Amplitude: 4.9 mV
Lead Channel Setting Pacing Amplitude: 1.125
Lead Channel Setting Pacing Amplitude: 1.5 V
Lead Channel Setting Pacing Pulse Width: 0.4 ms
Lead Channel Setting Sensing Sensitivity: 0.5 mV
Pulse Gen Model: 2272
Pulse Gen Serial Number: 3997722

## 2023-05-31 ENCOUNTER — Other Ambulatory Visit: Payer: Self-pay | Admitting: Internal Medicine

## 2023-06-03 ENCOUNTER — Encounter: Payer: Self-pay | Admitting: Cardiology

## 2023-06-04 ENCOUNTER — Other Ambulatory Visit: Payer: Self-pay | Admitting: Cardiovascular Disease

## 2023-07-05 NOTE — Addendum Note (Signed)
 Addended by: Lott Rouleau A on: 07/05/2023 10:42 AM   Modules accepted: Orders

## 2023-07-05 NOTE — Progress Notes (Signed)
 Remote pacemaker transmission.

## 2023-07-30 NOTE — Progress Notes (Signed)
 Electrophysiology Office Note:   Date:  08/01/2023  ID:  Dylan Roth, DOB 1940-12-24, MRN [id number]  Primary Cardiologist: Lonni Cash, MD Electrophysiologist: Fonda Kitty, MD      History of Present Illness:   Dylan Roth is a 83 y.o. male with h/o CAD s/p CABG x 5 2005, HTN, HLD, obesity, paroxysmal AF/flutter, sinus node dysfunction s/p PPM who is being seen today for pacemaker follow up.  Discussed the use of AI scribe software for clinical note transcription with the patient, who gave verbal consent to proceed.  History of Present Illness Dylan Roth is an 83 year old male with atrial fibrillation and a pacemaker who presents for follow-up. He was previously followed by Dr. Fernande.  He manages his atrial fibrillation with sotalol  and Eliquis , reporting no issues with these medications. He experiences occasional palpitations described as 'fluttering' or 'skipping' sensations. Despite these sensations, he is able to perform daily activities, though he sometimes needs to rest, especially in hot weather. His atrial fibrillation burden has been variable, with a significant episode lasting about seven hours occurring two days ago. The longest episode since January lasted a day and a half. He spends 94% of the time in normal rhythm, with 6% in atrial fibrillation.  He experienced a recent episode of lightheadedness and confusion after taking Benadryl  for bee stings without eating, which his neighbor's wife, a nurse, attributed to the medication. He recalls a similar feeling after a previous hospital discharge. He lives alone since his wife passed away two years ago and relies on neighbors for support when needed.  Review of systems complete and found to be negative unless listed in HPI.   EP Information / Studies Reviewed:    EKG is ordered today. Personal review as below.  EKG Interpretation Date/Time:  Tuesday July 31 2023 11:27:13 EDT Ventricular Rate:  60 PR  Interval:  196 QRS Duration:  152 QT Interval:  476 QTC Calculation: 476 R Axis:   -83  Text Interpretation: AV dual-paced rhythm When compared with ECG of 11-Feb-2018 06:50,  Now ventricular paced Confirmed by Kitty Fonda [phone]) on 07/31/2023 11:38:14 AM   Echo 12/2022:   1. Left ventricular ejection fraction, by estimation, is 55 to 60%. Left  ventricular ejection fraction by 3D volume is 57 %. The left ventricle has  normal function. The left ventricle has no regional wall motion  abnormalities. There is mild left ventricular hypertrophy of the basal-septal segment. Left ventricular  diastolic parameters are consistent with Grade I diastolic dysfunction  (impaired relaxation).   2. Right ventricular systolic function is mildly reduced. The right  ventricular size is normal. There is normal pulmonary artery systolic  pressure. The estimated right ventricular systolic pressure is 25.7 mmHg.   3. The mitral valve is grossly normal. Trivial mitral valve  regurgitation.   4. The aortic valve is tricuspid. Aortic valve regurgitation trivial to  mild. Aortic valve sclerosis is present, with no evidence of aortic valve  stenosis.   5. Aortic dilatation noted. There is borderline dilatation of the  ascending aorta, measuring 38 mm.   6. The inferior vena cava is normal in size with greater than 50%  respiratory variability, suggesting right atrial pressure of 3 mmHg.    Risk Assessment/Calculations:    CHA2DS2-VASc Score = 4   This indicates a 4.8% annual risk of stroke. The patient's score is based upon: CHF History: 0 HTN History: 1 Diabetes History: 0 Stroke History: 0 Vascular Disease History: 1  Age Score: 2 Gender Score: 0             Physical Exam:   VS:  BP 118/69   Pulse 60   Ht 5' 6 (1.676 m)   Wt 185 lb (83.9 kg)   SpO2 96%   BMI 29.86 kg/m    Wt Readings from Last 3 Encounters:  07/31/23 185 lb (83.9 kg)  04/02/23 190 lb 9.6 oz (86.5 kg)  01/01/23 186  lb (84.4 kg)     GEN: Well nourished, well developed in no acute distress NECK: No JVD CARDIAC: Normal rate, regular rhythm.  Left chest pacer pocket well-healed. RESPIRATORY:  Clear to auscultation without rales, wheezing or rhonchi  ABDOMEN: Soft, non-distended EXTREMITIES:  No edema; No deformity   ASSESSMENT AND PLAN:    #. Sinus node dysfunction s/p pacemaker:  - In-clinic device interrogation performed today.  Appropriate device function and stable lead parameters.  Presenting rhythm is AV paced.  Estimated longevity 7 years.  AT/AF burden 6%.  Programming remains appropriate. - Continue remote monitoring.  #.  Paroxysmal atrial fibrillation: Burden 6% on device check.  Appears to be relatively asymptomatic aside from occasional palpitations. #.  High risk medication use: Sotalol . Qtc . Cr 1.13 on 07/06/23.  - Continue sotalol  120 mg twice daily.  Instructed patient not to take Benadryl  and sotalol . - Continue monitoring AF burden on his device.  If burden is increasing, then we will discuss changing to dofetilide  or amiodarone versus catheter ablation.  #.  Secondary hypercoagulable state due to atrial fibrillation: -Continue Eliquis  5 mg twice daily.  #.  CAD status post CABG: - Continue atorvastatin  80 mg daily, ranolazine  500 mg twice daily, Imdur  30 mg twice daily.   Follow up with Dr. Kennyth in 6 months  Signed, Fonda Kennyth, MD

## 2023-07-31 ENCOUNTER — Encounter: Payer: Self-pay | Admitting: Cardiology

## 2023-07-31 ENCOUNTER — Ambulatory Visit: Attending: Cardiology | Admitting: Cardiology

## 2023-07-31 VITALS — BP 118/69 | HR 60 | Ht 66.0 in | Wt 185.0 lb

## 2023-07-31 DIAGNOSIS — Z950 Presence of cardiac pacemaker: Secondary | ICD-10-CM

## 2023-07-31 DIAGNOSIS — I480 Paroxysmal atrial fibrillation: Secondary | ICD-10-CM

## 2023-07-31 DIAGNOSIS — I495 Sick sinus syndrome: Secondary | ICD-10-CM

## 2023-07-31 DIAGNOSIS — D6869 Other thrombophilia: Secondary | ICD-10-CM

## 2023-07-31 DIAGNOSIS — Z79899 Other long term (current) drug therapy: Secondary | ICD-10-CM | POA: Diagnosis not present

## 2023-07-31 DIAGNOSIS — Z951 Presence of aortocoronary bypass graft: Secondary | ICD-10-CM

## 2023-07-31 NOTE — Patient Instructions (Signed)
 Medication Instructions:  Your physician recommends that you continue on your current medications as directed. Please refer to the Current Medication list given to you today.  *If you need a refill on your cardiac medications before your next appointment, please call your pharmacy*  Follow-Up: At [facility], you and your health needs are our priority.  As part of our continuing mission to provide you with exceptional heart care, our providers are all part of one team.  This team includes your primary Cardiologist (physician) and Advanced Practice Providers or APPs (Physician Assistants and Nurse Practitioners) who all work together to provide you with the care you need, when you need it.  Your next appointment:   6 months  Provider:   You may see Ardeen Kohler, MD or one of the following Advanced Practice Providers on your designated Care Team:   Mertha Abrahams, [place] [address] [place], PA-C Suzann Riddle, NP Creighton Doffing, NP

## 2023-08-22 ENCOUNTER — Ambulatory Visit: Payer: Medicare HMO

## 2023-08-22 DIAGNOSIS — I495 Sick sinus syndrome: Secondary | ICD-10-CM | POA: Diagnosis not present

## 2023-08-23 ENCOUNTER — Ambulatory Visit: Payer: Self-pay | Admitting: Cardiology

## 2023-08-23 LAB — CUP PACEART REMOTE DEVICE CHECK
Battery Remaining Longevity: 83 mo
Battery Remaining Percentage: 74 %
Battery Voltage: 3.01 V
Brady Statistic AP VP Percent: 99 %
Brady Statistic AP VS Percent: 1 %
Brady Statistic AS VP Percent: 1 %
Brady Statistic AS VS Percent: 1 %
Brady Statistic RA Percent Paced: 88 %
Brady Statistic RV Percent Paced: 99 %
Date Time Interrogation Session: 20250723020017
Implantable Lead Connection Status: 753985
Implantable Lead Connection Status: 753985
Implantable Lead Implant Date: 20130425
Implantable Lead Implant Date: 20130425
Implantable Lead Location: 753859
Implantable Lead Location: 753860
Implantable Pulse Generator Implant Date: 20230125
Lead Channel Impedance Value: 360 Ohm
Lead Channel Impedance Value: 380 Ohm
Lead Channel Pacing Threshold Amplitude: 0.5 V
Lead Channel Pacing Threshold Amplitude: 0.875 V
Lead Channel Pacing Threshold Pulse Width: 0.4 ms
Lead Channel Pacing Threshold Pulse Width: 0.4 ms
Lead Channel Sensing Intrinsic Amplitude: 3.4 mV
Lead Channel Sensing Intrinsic Amplitude: 4.2 mV
Lead Channel Setting Pacing Amplitude: 1.125
Lead Channel Setting Pacing Amplitude: 1.5 V
Lead Channel Setting Pacing Pulse Width: 0.4 ms
Lead Channel Setting Sensing Sensitivity: 0.5 mV
Pulse Gen Model: 2272
Pulse Gen Serial Number: 3997722

## 2023-10-15 ENCOUNTER — Other Ambulatory Visit: Payer: Self-pay | Admitting: Cardiovascular Disease

## 2023-11-01 NOTE — Progress Notes (Signed)
 Remote PPM Transmission

## 2023-11-04 ENCOUNTER — Other Ambulatory Visit: Payer: Self-pay | Admitting: Cardiovascular Disease

## 2023-11-06 ENCOUNTER — Other Ambulatory Visit: Payer: Self-pay | Admitting: Cardiovascular Disease

## 2023-11-11 ENCOUNTER — Other Ambulatory Visit: Payer: Self-pay | Admitting: Cardiovascular Disease

## 2023-11-11 DIAGNOSIS — I480 Paroxysmal atrial fibrillation: Secondary | ICD-10-CM

## 2023-11-12 NOTE — Telephone Encounter (Signed)
 Eliquis  5mg  refill request received. Patient is 83 years old, weight-83.9kg, Crea-1.13 on 07/16/23 via Care Everywhere from [facility], Diagnosis-Afib/flutter, and last seen by Dr. Kennyth on 07/31/23. Dose is appropriate based on dosing criteria. Will send in refill to requested pharmacy.

## 2023-11-21 ENCOUNTER — Ambulatory Visit: Payer: Medicare HMO

## 2023-11-21 DIAGNOSIS — I495 Sick sinus syndrome: Secondary | ICD-10-CM | POA: Diagnosis not present

## 2023-11-22 LAB — CUP PACEART REMOTE DEVICE CHECK
Battery Remaining Longevity: 79 mo
Battery Remaining Percentage: 71 %
Battery Voltage: 3.01 V
Brady Statistic AP VP Percent: 99 %
Brady Statistic AP VS Percent: 1 %
Brady Statistic AS VP Percent: 1 %
Brady Statistic AS VS Percent: 1 %
Brady Statistic RA Percent Paced: 86 %
Brady Statistic RV Percent Paced: 99 %
Date Time Interrogation Session: 20251022020016
Implantable Lead Connection Status: 753985
Implantable Lead Connection Status: 753985
Implantable Lead Implant Date: 20130425
Implantable Lead Implant Date: 20130425
Implantable Lead Location: 753859
Implantable Lead Location: 753860
Implantable Pulse Generator Implant Date: 20230125
Lead Channel Impedance Value: 360 Ohm
Lead Channel Impedance Value: 360 Ohm
Lead Channel Pacing Threshold Amplitude: 0.5 V
Lead Channel Pacing Threshold Amplitude: 0.875 V
Lead Channel Pacing Threshold Pulse Width: 0.4 ms
Lead Channel Pacing Threshold Pulse Width: 0.4 ms
Lead Channel Sensing Intrinsic Amplitude: 3.2 mV
Lead Channel Sensing Intrinsic Amplitude: 4.1 mV
Lead Channel Setting Pacing Amplitude: 1.125
Lead Channel Setting Pacing Amplitude: 1.5 V
Lead Channel Setting Pacing Pulse Width: 0.4 ms
Lead Channel Setting Sensing Sensitivity: 0.5 mV
Pulse Gen Model: 2272
Pulse Gen Serial Number: 3997722

## 2023-11-23 NOTE — Progress Notes (Signed)
 Remote PPM Transmission

## 2023-11-26 ENCOUNTER — Ambulatory Visit: Payer: Self-pay | Admitting: Cardiology

## 2023-12-12 ENCOUNTER — Telehealth: Payer: Self-pay | Admitting: Cardiology

## 2023-12-12 NOTE — Telephone Encounter (Signed)
 Pt c/o Shortness Of Breath: STAT if SOB developed within the last 24 hours or pt is noticeably SOB on the phone  1. Are you currently SOB (can you hear that pt is SOB on the phone)? no  2. How long have you been experiencing SOB? A couple of weeks   3. Are you SOB when sitting or when up moving around? Moving around   4. Are you currently experiencing any other symptoms? Headaches, Dizziness once a week

## 2023-12-12 NOTE — Telephone Encounter (Signed)
 Spoke with the patient who states that for the past couple of weeks he has noticed more shortness of breath. He states that he becomes winded after walking just 5 feet and becomes fatigued easily. He reports that when he is out walking around [place] he can feel his heart pounding harder than normal in his chest. He is having to take more breaks than usual. Patient denies any chest pain. Denies any swelling or weight gain. Reports blood pressures have been good, this AM 121/71. He does report headaches but notes that his is related to eye strain. Also notes some dizziness at times but patient has trouble describing the feeling and when it occurs. He does have a pacemaker and can send in a transmission but will need assistance on how to do so. Advised patient that if his device looks good we can get in touch with general cardiology for follow up.

## 2023-12-12 NOTE — Telephone Encounter (Signed)
 Called to speak with patient and request a manual transmission  This RN assisted patient with sending a manual transmission  Transmission successfully received and reviewed   Patient's AF burden trending upward (9% ---> 15%) ever since last office visit on 08/09/23 with an added increase in frequency of symptoms during episodes like SOB with minimal activity and overall increased fatigue per patient report  Patient confirms compliancy with his Eliquis , Sotalol , and Coreg  medications   This RN suggested that patient be seen in AF clinic for on-going management of condition and possible additional treatment options  Patient agreeable to this suggestion   This RN scheduled patient for appointment in AF clinic on Monday 12/17/23 at 1330  All questions and concerns addressed at time of call  Patient extremely appreciative of assistance with sending transmission and setting up appointment from this RN

## 2023-12-17 ENCOUNTER — Encounter (HOSPITAL_COMMUNITY): Payer: Self-pay | Admitting: Physician Assistant

## 2023-12-17 ENCOUNTER — Ambulatory Visit (HOSPITAL_COMMUNITY)
Admission: RE | Admit: 2023-12-17 | Discharge: 2023-12-17 | Disposition: A | Discharge status: Home / Self Care | Source: Ambulatory Visit | Attending: Physician Assistant | Admitting: Physician Assistant

## 2023-12-17 VITALS — BP 118/82 | HR 71 | Ht 66.0 in | Wt 190.6 lb

## 2023-12-17 DIAGNOSIS — I480 Paroxysmal atrial fibrillation: Secondary | ICD-10-CM

## 2023-12-17 DIAGNOSIS — I495 Sick sinus syndrome: Secondary | ICD-10-CM

## 2023-12-17 DIAGNOSIS — E785 Hyperlipidemia, unspecified: Secondary | ICD-10-CM

## 2023-12-17 DIAGNOSIS — D6869 Other thrombophilia: Secondary | ICD-10-CM | POA: Diagnosis not present

## 2023-12-17 MED ORDER — AMIODARONE HCL 200 MG PO TABS: ORAL_TABLET | ORAL | 1 refills | Status: DC

## 2023-12-17 NOTE — Progress Notes (Signed)
 Primary Care Physician: Ryan Powell HERO., MD Primary Cardiologist: Lonni Cash, MD Electrophysiologist: Fonda Kitty, MD  Referring Physician: Lonni Cash, MD   Dylan Roth is a 83 y.o. 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  The patient was initially diagnosed with paroxysmal atrial fibrillation/flutter in 2012.  He was last seen by Dr. Kitty on 07/31/2023 for pacemaker follow-up. During visit he reported occasional palpitations and flutters on current antiarrhythmic consisting of sotalol .  He was continued on current medication at that time and advised to abstain from Benadryl .  He also discussed pursuing Tikosyn , amiodarone, or ablation in the future if AF burden increased.  That Paceart report shows 14% AF burden with longest episode being 1 hour and 59 minutes. He noted symptoms of shortness of breath with minimal activity and increased fatigue.  Patient presents today for follow up for atrial fibrillation.   Dylan Roth presents today for follow-up visit and management of atrial fibrillation.  He reports over the past few months experiencing increased shortness of breath with exertion such as [place].  He denies any chest pain with these episodes but does endorse dizziness. He has been compliant with his medications and denies any missed doses of Eliquis  since his previous visit. EKG today shows V=Paced with atrial fibrillation and denies any shortness of breath at this time.  We discussed making adjustments to his medication as well as pursuing ablation in the future. He has elected to make an adjustment to his antiarrhythmic regimen.  Today, he denies symptoms of palpitations, chest pain, shortness of breath, orthopnea, PND, lower extremity edema, dizziness, presyncope, syncope, snoring,  daytime somnolence, bleeding, or neurologic sequela. The patient is tolerating medications without difficulties and is otherwise without complaint today.   Atrial Fibrillation Risk Factors:  he does not have symptoms or diagnosis of sleep apnea. he does not have a history of rheumatic fever. he does not have a history of alcohol use. The patient is have a history of early familial atrial fibrillation or other arrhythmias.  Atrial Fibrillation Management history:  Previous antiarrhythmic drugs: Tikosyn  (cost prohibitive- stopped 2013), Sotalol  (2013) Previous cardioversions: None Previous ablations: None Anticoagulation history: Eliquis   ROS- All systems are reviewed and negative except as per the HPI above.  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 80% left main, 80% LAD, 90% ramus, 50% circ, 90% in nondominant RCA,  EF normal  2010--echo   Cataract    bilateral-removed 6-7 years ago   Central obesity    Chronic back pain    Dysrhythmia    Erectile dysfunction    GERD (gastroesophageal reflux disease)    pt. denies   Gout    once; I have it under control w/medicine   History of anemia    History of colon polyps    Hyperlipidemia    Hypertension    IBS (irritable bowel syndrome)    with diarrhea   Myocardial infarction (HCC)    pt. denies at preop   Pacemaker    Palpitations    Sinus node dysfunction (HCC)    post  termination pauses <5sec    Current Outpatient Medications  Medication Sig Dispense Refill   allopurinol  (ZYLOPRIM ) 300 MG tablet Take 300 mg by mouth daily.     amiodarone (PACERONE) 200 MG tablet Take 1 tablet (200 mg total) by mouth 2 (two) times daily for 30 days, THEN 1 tablet (200 mg total) daily. 60 tablet 1   apixaban  (ELIQUIS ) 5 MG TABS tablet TAKE 1 TABLET TWICE DAILY 180 tablet  2   atorvastatin  (LIPITOR) 80 MG tablet Take 80 mg by mouth at bedtime.     carvedilol  (COREG ) 3.125 MG tablet TAKE 1 TABLET TWICE DAILY WITH MEALS 180 tablet 1   escitalopram (LEXAPRO) 10 MG tablet Take 10 mg by mouth daily with supper.     isosorbide  mononitrate (IMDUR ) 30 MG 24 hr tablet TAKE 1 TABLET TWICE DAILY 180 tablet 3   lisinopril  (ZESTRIL ) 5 MG tablet Take 2.5 mg by mouth daily.     methocarbamol  (ROBAXIN ) 500 MG tablet Take 1-2 tablets (500-1,000 mg total) by mouth 4 (four) times daily. 60 tablet 0   nitroGLYCERIN  (NITROSTAT ) 0.4 MG SL tablet DISSOLVE 1 TABLET UNDER THE TONGUE EVERY 5 MINUTES AS NEEDED FOR CHEST PAIN. DO NOT EXCEED A TOTAL OF 3 DOSES IN 15 MINUTES 75 tablet 3   oxyCODONE  (OXY IR/ROXICODONE ) 5 MG immediate release tablet Take 1-2 tablets (5-10 mg total) by mouth every 6 (six) hours as needed for severe pain (pain score 7-10). 40 tablet 0   oxyCODONE  (OXY IR/ROXICODONE ) 5 MG immediate release tablet Take 1-2 tablets (5-10 mg total) by mouth every 6 (six) hours as needed. 10 tablet 0   ranolazine  (RANEXA ) 500 MG 12 hr tablet TAKE 1 TABLET TWICE DAILY 180 tablet 2   No current facility-administered medications for this encounter.    Physical Exam: BP 118/82   Pulse 71   Ht 5' 6 (1.676 m)   Wt 86.5 kg   BMI 30.76 kg/m   GEN: Well nourished, well developed in no acute distress NECK: No JVD; No carotid bruits CARDIAC: Paced irregularly irregular rhythm, no murmurs, rubs, gallops RESPIRATORY:  Clear to auscultation without rales, wheezing or rhonchi  ABDOMEN: Soft, non-tender, non-distended EXTREMITIES:  No edema; No deformity   Wt Readings from Last 3 Encounters:  12/17/23 86.5 kg  07/31/23 83.9 kg  04/02/23 86.5 kg     EKG today demonstrates  Vent. rate 71 BPM PR interval * ms QRS duration 154 ms QT/QTcB 452/491 ms P-R-T axes * -87 77 Ventricular-paced rhythm  Echo 12/07/2022 demonstrated  1. Left ventricular ejection fraction, by estimation, is 55  to 60%. Left  ventricular ejection fraction by 3D volume is 57 %. The left ventricle has  normal function. The left ventricle has no regional wall motion  abnormalities. There is mild left  ventricular hypertrophy of the basal-septal segment. Left ventricular  diastolic parameters are consistent with Grade I diastolic dysfunction  (impaired relaxation).   2. Right ventricular systolic function is mildly reduced. The right  ventricular size is normal. There is normal pulmonary artery systolic  pressure. The estimated right ventricular systolic pressure is 25.7 mmHg.   3. The mitral valve is grossly normal. Trivial mitral valve  regurgitation.   4. The aortic valve is tricuspid. Aortic valve regurgitation trivial to  mild. Aortic valve sclerosis is present, with no evidence of aortic valve  stenosis.   5. Aortic dilatation noted. There is borderline dilatation of the  ascending aorta, measuring 38 mm.   6. The inferior  vena cava is normal in size with greater than 50%  respiratory variability, suggesting right atrial pressure of 3 mmHg.    CHA2DS2-VASc Score = 4  The patient's score is based upon: CHF History: 0 HTN History: 1 Diabetes History: 0 Stroke History: 0 Vascular Disease History: 1 Age Score: 2 Gender Score: 0       ASSESSMENT AND PLAN: Paroxysmal Atrial Fibrillation (ICD10:  I48.0) The patient's CHA2DS2-VASc score is 4, indicating a 4.8% annual risk of stroke.  -EKG completed today shows V-paced with atrial fibrillation  - Discontinue sotalol  120 mg today and allow a 3-day washout -Recent TSH was 1.59 and liver enzymes were stable -Start amiodarone 200 mg twice daily x 4 weeks then 200 mg daily following washout. -Patient will pursue ablation if unable to maintain SR with amiodarone or symptoms persist. -Continue carvedilol  3.125 mg twice daily  - Continue Eliquis  5 mg twice daily - Check EKG in 2 weeks  Sinus node dysfunction: -Dual-chamber PPM with normal  histogram per Paceart report -Currently followed by Dr.Parker  Secondary Hypercoagulable State (ICD10:  D68.69) The patient is at significant risk for stroke/thromboembolism based upon his CHA2DS2-VASc Score of 4.  Continue Apixaban  (Eliquis ).   CAD S/p CABG x 5 2005 No anginal symptoms Followed by Dr Verlin  HTN Stable on current regimen   Follow up for ECG in two weeks and office visit in one month.     [facility] [address] [place], [place] [address] [phone]

## 2023-12-17 NOTE — Patient Instructions (Addendum)
 STOP sotalol    On FRIDAY November 21st - START Amiodarone 200mg  twice a day (01/19/24) then reduce to 200mg  once a day

## 2023-12-18 ENCOUNTER — Other Ambulatory Visit: Payer: Self-pay | Admitting: Cardiovascular Disease

## 2023-12-24 ENCOUNTER — Telehealth: Payer: Self-pay

## 2023-12-24 NOTE — Telephone Encounter (Signed)
 Patient states he woke up at 4 am with shoulder/elbow and wrist pain.  Discussed symptoms with patient - hx of arthritis in that arm and pain was relieved with his Salon Pas patches.  The NTG he took made no impact on his symptoms. No other complaints.  Denied any feelings of SOB, CP, dizziness, palpitations or near syncopal symptoms at any point.   Transmission reviewed: Device function normal.  Ongoing AF/Flutter with overall controlled v rates. Burden acutely elevated Patient just started on Amio on 12/17/23.  He sees R. Fenton, PA in AF clinic on 01/07/24.   Reassured patient symptoms sound more consistent with arthritis and not cardiac in nature.  Will forward to AF clinic as FYI. Plans to keep appointment on 01/07/24, knows we will call back if anything further.

## 2023-12-24 NOTE — Telephone Encounter (Addendum)
 Pt was directed to PCP office for elbow complaint. Pt has had some intermittent dizziness since starting amiodarone  but similar to prior episodes of dizziness which associated with being in afib. Pt will call if symptoms worsen prior to scheduled follow up.

## 2023-12-24 NOTE — Telephone Encounter (Signed)
 Pt called in letting us  know that last night he had some pain in his L shoulder and wrist and when he took nitroglycerin  the pain didn't go away. Pt is concerned and is going to send in a transmission to see if everything looks ok. Pt not currently in pain

## 2023-12-25 NOTE — Telephone Encounter (Signed)
 Pt took amiodarone  with food today and felt slightly better but still having some intermittent unsteadiness on feet and nausea. Encouraged pt to take amiodarone  with food to reduce nausea if continues pt is asking to go to once a day and see if this helps symptoms. Pt will call with update next week.

## 2024-01-07 ENCOUNTER — Ambulatory Visit (HOSPITAL_COMMUNITY)
Admission: RE | Admit: 2024-01-07 | Discharge: 2024-01-07 | Disposition: A | Source: Ambulatory Visit | Attending: Physician Assistant | Admitting: Physician Assistant

## 2024-01-07 VITALS — HR 70

## 2024-01-07 DIAGNOSIS — I4891 Unspecified atrial fibrillation: Secondary | ICD-10-CM | POA: Diagnosis not present

## 2024-01-07 DIAGNOSIS — I4819 Other persistent atrial fibrillation: Secondary | ICD-10-CM

## 2024-01-07 NOTE — Progress Notes (Signed)
 Dylan Roth presents today for EKG check and to evaluate his response to amiodarone .  He reports compliance with Amiodarone  200 mg BID and denies any adverse reactions.  EKG today however shows that he is still in underlying atrial fibrillation. We will schedule him for a DCCV to convert to sinus rhythm  and then reduce Amiodarone  dose to 200 mg once daily.  He has a scheduled follow-up on 01/28/2024 and if back in AF at that time will be referred to EP to discuss redo ablation.  EKG Interpretation Date/Time:  Monday January 07 2024 13:26:55 EST Ventricular Rate:  70 PR Interval:    QRS Duration:  164 QT Interval:  460 QTC Calculation: 496 R Axis:   -82  Text Interpretation: Ventricular-paced rhythm with Underlying Atrial Fib Abnormal ECG When compared with ECG of 17-Dec-2023 13:22, No significant change was found Confirmed by Wyn Manus [phone]) on 01/07/2024 1:58:24 PM   Informed Consent   Shared Decision Making/Informed Consent The risks (stroke, cardiac arrhythmias rarely resulting in the need for a temporary or permanent pacemaker, skin irritation or burns and complications associated with conscious sedation including aspiration, arrhythmia, respiratory failure and death), benefits (restoration of normal sinus rhythm) and alternatives of a direct current cardioversion were explained in detail to Dylan Roth and he agrees to proceed.

## 2024-01-07 NOTE — H&P (View-Only) (Signed)
 Dylan Roth presents today for EKG check and to evaluate his response to amiodarone .  He reports compliance with Amiodarone  200 mg BID and denies any adverse reactions.  EKG today however shows that he is still in underlying atrial fibrillation. We will schedule him for a DCCV to convert to sinus rhythm  and then reduce Amiodarone  dose to 200 mg once daily.  He has a scheduled follow-up on 01/28/2024 and if back in AF at that time will be referred to EP to discuss redo ablation.  EKG Interpretation Date/Time:  Monday January 07 2024 13:26:55 EST Ventricular Rate:  70 PR Interval:    QRS Duration:  164 QT Interval:  460 QTC Calculation: 496 R Axis:   -82  Text Interpretation: Ventricular-paced rhythm with Underlying Atrial Fib Abnormal ECG When compared with ECG of 17-Dec-2023 13:22, No significant change was found Confirmed by Wyn Manus [phone]) on 01/07/2024 1:58:24 PM   Informed Consent   Shared Decision Making/Informed Consent The risks (stroke, cardiac arrhythmias rarely resulting in the need for a temporary or permanent pacemaker, skin irritation or burns and complications associated with conscious sedation including aspiration, arrhythmia, respiratory failure and death), benefits (restoration of normal sinus rhythm) and alternatives of a direct current cardioversion were explained in detail to Dylan Roth and he agrees to proceed.

## 2024-01-07 NOTE — Patient Instructions (Addendum)
  Day of cardioversion reduce Amiodarone  to 200mg  ONCE a day   Cardioversion scheduled for: Tuesday December 16th   - Arrive at the [facility] ([address])  and check in with ADMITTING at 10:00 AM   - Do not eat or drink anything after midnight the night prior to your procedure.   - Take all your morning medication (except diabetic medications) with a sip of water  prior to arrival.  - Do NOT miss any doses of your blood thinner - if you should miss a dose or take a dose more than 4 hours late -- please notify our office immediately.  - You will not be able to drive home after your procedure. Please ensure you have a responsible adult to drive you home. You will need someone with you for 24 hours post procedure.     - Expect to be in the procedural area approximately 2 hours.   - If you feel as if you go back into normal rhythm prior to scheduled cardioversion, please notify our office immediately.   If your procedure is canceled in the cardioversion suite you will be charged a cancellation fee.

## 2024-01-10 ENCOUNTER — Telehealth (HOSPITAL_COMMUNITY): Payer: Self-pay | Admitting: *Deleted

## 2024-01-10 ENCOUNTER — Other Ambulatory Visit (HOSPITAL_COMMUNITY): Payer: Self-pay | Admitting: *Deleted

## 2024-01-10 ENCOUNTER — Telehealth: Payer: Self-pay | Admitting: *Deleted

## 2024-01-10 NOTE — Telephone Encounter (Signed)
 Transmission received and reviewed (I do not see a note in the chart where he called yesterday)   Presenting rhythm: AF/AFL/ VP. AT/AF burden is 24% since 07/31/23, but burden is most likely higher as atrial sensitivity is set at 0.75 mV (may be due to known atrial lead noise).  His device does not record any heart failure diagnostics.   Patient currently schedule for a DCCV on 01/15/24.   Forwarding to AF Clinic to follow up with the patient.

## 2024-01-10 NOTE — Telephone Encounter (Signed)
 Pt called and reported not feeling well with Afib increased SOB and feeling more tired. Discussed with R.Fenton P.A. he suggest DCCV to be sooner if possible. I was able to change his scheduled DCCV to tomorrow 01/11/24. I reviewed instructions with pt he agrees tand understands the plan.

## 2024-01-10 NOTE — Progress Notes (Signed)
 Pt called for pre procedure instructions. Arrival time 0745 NPO after midnight explained Instructed to take am meds with sip of water and confirmed blood thinner consistency Instructed pt need for ride home tomorrow and have responsible adult with them for 24 hrs post procedure.

## 2024-01-10 NOTE — Telephone Encounter (Signed)
-----   Message from Nurse Nancy DEL, RN sent at 01/10/2024 11:13 AM EST ----- Regarding: device Pt called and said he spoke with someone yesterday and sent a transmission to be viewed because he SOB and concerned. Did you receive anything from his device that we need to follow up with. Thanks -Karlene RN Afib Clinic

## 2024-01-11 ENCOUNTER — Ambulatory Visit (HOSPITAL_COMMUNITY)
Admission: RE | Admit: 2024-01-11 | Discharge: 2024-01-11 | Disposition: A | Source: Ambulatory Visit | Attending: Physician Assistant | Admitting: Physician Assistant

## 2024-01-11 ENCOUNTER — Other Ambulatory Visit: Payer: Self-pay

## 2024-01-11 VITALS — BP 122/68 | HR 71 | Ht 66.0 in | Wt 192.2 lb

## 2024-01-11 DIAGNOSIS — Z5181 Encounter for therapeutic drug level monitoring: Secondary | ICD-10-CM | POA: Diagnosis not present

## 2024-01-11 DIAGNOSIS — I4819 Other persistent atrial fibrillation: Secondary | ICD-10-CM

## 2024-01-11 DIAGNOSIS — D6869 Other thrombophilia: Secondary | ICD-10-CM

## 2024-01-11 DIAGNOSIS — I4891 Unspecified atrial fibrillation: Secondary | ICD-10-CM

## 2024-01-11 DIAGNOSIS — Z79899 Other long term (current) drug therapy: Secondary | ICD-10-CM | POA: Diagnosis not present

## 2024-01-11 DIAGNOSIS — R0609 Other forms of dyspnea: Secondary | ICD-10-CM

## 2024-01-11 MED ORDER — AMIODARONE HCL 200 MG PO TABS: 200.0000 mg | ORAL_TABLET | Freq: Every day | ORAL | Status: DC

## 2024-01-11 MED ORDER — SODIUM CHLORIDE 0.9 % IV SOLN: INTRAVENOUS | Status: DC

## 2024-01-11 MED ORDER — AMIODARONE HCL 200 MG PO TABS: 200.0000 mg | ORAL_TABLET | Freq: Every day | ORAL | 2 refills | Status: DC

## 2024-01-11 NOTE — Patient Instructions (Addendum)
Decrease amiodarone 200mg once a day 

## 2024-01-11 NOTE — Progress Notes (Signed)
 Primary Care Physician: Ryan Powell HERO., MD Primary Cardiologist: Lonni Cash, MD Electrophysiologist: Fonda Kitty, MD  Referring Physician: Lonni Cash, MD   Dylan Roth is a 83 y.o. 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.  The patient was initially diagnosed with paroxysmal atrial fibrillation/flutter in 2012.  He was last seen by Dr. Kitty on 07/31/2023 for pacemaker follow-up. During visit he reported occasional palpitations and flutters on current antiarrhythmic consisting of sotalol .  That Paceart report showed 14% AF burden. Dr Kitty discussed pursuing Tikosyn , amiodarone , or ablation in the future if AF burden increased.   He was seen in clinic 12/17/23 with persistent afib and transitioned from sotalol  to amiodarone .   Patient returns for follow up for atrial fibrillation. He was scheduled for DCCV this AM but the procedure was cancelled as he had chemically converted to SR. However, patient called clinic later reporting that he was still getting SOB with walking short distances. He denies chest pain. No bleeding issues on anticoagulation.   Today, he  denies symptoms of palpitations, chest pain, orthopnea, PND, lower extremity edema, dizziness, presyncope, syncope, snoring, daytime somnolence, bleeding, or neurologic sequela. The patient is tolerating medications without difficulties and is otherwise without complaint today.    Atrial Fibrillation Risk Factors:  he does not have symptoms or diagnosis of sleep apnea. he does not have a history of rheumatic fever. he does not have a history of alcohol use. The patient is have a history of early familial atrial fibrillation or other arrhythmias.  Atrial Fibrillation Management history:  Previous antiarrhythmic drugs: Tikosyn  (cost prohibitive- stopped  2013), Sotalol  (2013), amiodarone   Previous cardioversions: None Previous ablations: None Anticoagulation history: Eliquis   ROS- All systems are reviewed and negative except as per the HPI above.  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 80% left main, 80% LAD, 90% ramus, 50% circ, 90% in nondominant RCA,  EF normal  2010--echo   Cataract    bilateral-removed 6-7 years ago   Central obesity    Chronic back pain    Dysrhythmia    Erectile dysfunction    GERD (gastroesophageal reflux disease)    pt. denies   Gout    once; I have it under control w/medicine   History of anemia    History of colon polyps    Hyperlipidemia    Hypertension    IBS (irritable bowel syndrome)    with diarrhea   Myocardial infarction (HCC)    pt. denies at preop   Pacemaker    Palpitations    Sinus node dysfunction (HCC)    post termination pauses <5sec    Current Outpatient Medications  Medication Sig Dispense Refill   allopurinol  (ZYLOPRIM ) 100 MG tablet Take 100 mg by mouth in the morning.     amiodarone  (PACERONE ) 200 MG tablet Take 1 tablet (200 mg total) by mouth 2 (two) times daily for 30 days, THEN 1 tablet (200 mg total) daily. 60 tablet 1   apixaban  (ELIQUIS ) 5 MG TABS tablet TAKE 1 TABLET TWICE DAILY 180 tablet 2   atorvastatin  (LIPITOR) 80 MG tablet Take  80 mg by mouth at bedtime.     carvedilol  (COREG ) 3.125 MG tablet TAKE 1 TABLET TWICE DAILY WITH MEALS 180 tablet 1   escitalopram (LEXAPRO) 10 MG tablet Take 10 mg by mouth at bedtime.     isosorbide  mononitrate (IMDUR ) 30 MG 24 hr tablet TAKE 1 TABLET TWICE DAILY 180 tablet 3   lisinopril  (ZESTRIL ) 5 MG tablet Take 2.5 mg by mouth daily.     MAGNESIUM  PO Take 1 capsule by mouth in the morning.     nitroGLYCERIN  (NITROSTAT ) 0.4 MG SL tablet DISSOLVE 1 TABLET UNDER  THE TONGUE EVERY 5 MINUTES AS NEEDED FOR CHEST PAIN. DO NOT EXCEED A TOTAL OF 3 DOSES IN 15 MINUTES 75 tablet 3   ranolazine  (RANEXA ) 500 MG 12 hr tablet TAKE 1 TABLET TWICE DAILY 180 tablet 1   tamsulosin  (FLOMAX ) 0.4 MG CAPS capsule Take 0.4 mg by mouth in the morning.     No current facility-administered medications for this encounter.    Physical Exam: BP 122/68   Pulse 71   Ht 5' 6 (1.676 m)   Wt 87.2 kg   BMI 31.02 kg/m   GEN: Well nourished, well developed in no acute distress NECK: No JVD CARDIAC: Regular rate and rhythm, no murmurs, rubs, gallops RESPIRATORY:  Clear to auscultation without rales, wheezing or rhonchi  ABDOMEN: Soft, non-tender, non-distended EXTREMITIES:  No edema; No deformity    Wt Readings from Last 3 Encounters:  01/11/24 87.2 kg  12/17/23 86.5 kg  07/31/23 83.9 kg     EKG Interpretation Date/Time:  Friday January 11 2024 13:29:02 EST Ventricular Rate:  71 PR Interval:  220 QRS Duration:  140 QT Interval:  430 QTC Calculation: 467 R Axis:   -80  Text Interpretation: AV dual-paced rhythm with prolonged AV conduction Abnormal ECG When compared with ECG of 07-Jan-2024 13:26, AV dual-paced rhythm has replaced VENTRICULAR PACED RHYTHM Confirmed by Ashlinn Hemrick ([id number]) on 01/11/2024 1:37:03 PM    Echo 12/07/2022 demonstrated  1. Left ventricular ejection fraction, by estimation, is 55 to 60%. Left  ventricular ejection fraction by 3D volume is 57 %. The left ventricle has  normal function. The left ventricle has no regional wall motion  abnormalities. There is mild left  ventricular hypertrophy of the basal-septal segment. Left ventricular  diastolic parameters are consistent with Grade I diastolic dysfunction  (impaired relaxation).   2. Right ventricular systolic function is mildly reduced. The right  ventricular size is normal. There is normal pulmonary artery systolic  pressure. The estimated right ventricular systolic pressure is 25.7  mmHg.   3. The mitral valve is grossly normal. Trivial mitral valve  regurgitation.   4. The aortic valve is tricuspid. Aortic valve regurgitation trivial to  mild. Aortic valve sclerosis is present, with no evidence of aortic valve  stenosis.   5. Aortic dilatation noted. There is borderline dilatation of the  ascending aorta, measuring 38 mm.   6. The inferior vena cava is normal in size with greater than 50%  respiratory variability, suggesting right atrial pressure of 3 mmHg.    CHA2DS2-VASc Score = 4  The patient's score is based upon: CHF History: 0 HTN History: 1 Diabetes History: 0 Stroke History: 0 Vascular Disease History: 1 Age Score: 2 Gender Score: 0       ASSESSMENT AND PLAN: Persistent Atrial Fibrillation (ICD10:  I48.19) The patient's CHA2DS2-VASc score is 4, indicating a 4.8% annual risk of stroke.   Previously failed sotalol , loaded on amiodarone .  Patient presented for DCCV this AM but was AV dual paced, procedure cancelled.  Decrease amiodarone  to 200 mg daily Continue carvedilol  3.125 mg BID Continue Eliquis  5 mg BID  Secondary Hypercoagulable State (ICD10:  D68.69) The patient is at significant risk for stroke/thromboembolism based upon his CHA2DS2-VASc Score of 4.  Continue Apixaban  (Eliquis ). No bleeding issues.   High Risk Medication Monitoring (ICD 10: [id number]) Patient requires ongoing monitoring for anti-arrhythmic medication which has the potential to cause life threatening arrhythmias. Intervals on ECG acceptable for amiodarone  monitoring.   Sinus node dysfunction: S/p PPM, followed by Dr Kennyth  CAD S/p CABG 2005 No anginal symptoms Followed by Dr Verlin  HTN Stable on current regimen  Dyspnea on exertion Patient still reporting dyspnea on exertion despite being back in SR. His weight has increased since visit on 11/17 but has remained stable. He denies lower extremity edema, PND, or orthopnea. He does have some abdominal bloating.  Will check BNP today. If elevated, will start Lasix  20 mg daily and reevaluate at visit on 12/29. If BNP normal, will need to see his primary cardiology team for evaluation.    Follow up with Daphne Barrack as scheduled.     [facility] [address] [place], [place] [address] [phone]

## 2024-01-11 NOTE — Interval H&P Note (Signed)
 History and Physical Interval Note:  01/11/2024 7:31 AM  Dylan Roth  has presented today for surgery, with the diagnosis of AFIB.  The various methods of treatment have been discussed with the patient and family. After consideration of risks, benefits and other options for treatment, the patient has consented to  Procedures: CARDIOVERSION (N/A) as a surgical intervention.  The patient's history has been reviewed, patient examined, no change in status, stable for surgery.  I have reviewed the patient's chart and labs.  Questions were answered to the patient's satisfaction.     Georganna Archer

## 2024-01-11 NOTE — CV Procedure (Signed)
 The patient was found to be in normal sinus rhythm prior to cardioversion. No procedure was performed.    Asante Blanda T. Floretta HEATH, MD [place]  [facility]  01/11/2024 8:04 AM

## 2024-01-12 LAB — BRAIN NATRIURETIC PEPTIDE: BNP: 270.1 pg/mL — ABNORMAL HIGH (ref 0.0–100.0)

## 2024-01-14 ENCOUNTER — Other Ambulatory Visit (HOSPITAL_COMMUNITY): Payer: Self-pay | Admitting: *Deleted

## 2024-01-14 ENCOUNTER — Ambulatory Visit (HOSPITAL_COMMUNITY): Payer: Self-pay | Admitting: Physician Assistant

## 2024-01-14 MED ORDER — FUROSEMIDE 20 MG PO TABS: 20.0000 mg | ORAL_TABLET | Freq: Every day | ORAL | 0 refills | Status: DC

## 2024-01-15 ENCOUNTER — Encounter (HOSPITAL_COMMUNITY)
Admission: RE | Disposition: A | Payer: Self-pay | Source: Home / Self Care | Attending: Student in an Organized Health Care Education/Training Program

## 2024-01-15 ENCOUNTER — Ambulatory Visit (HOSPITAL_COMMUNITY)
Admission: RE | Admit: 2024-01-15 | Discharge: 2024-01-11 | Disposition: A | Attending: Student in an Organized Health Care Education/Training Program | Admitting: Cardiovascular Disease

## 2024-01-15 SURGERY — CARDIOVERSION (CATH LAB)
Anesthesia: Monitor Anesthesia Care

## 2024-01-27 NOTE — Progress Notes (Unsigned)
" °  Electrophysiology Office Note:   Date:  01/27/2024  ID:  DONTRAVIOUS CAMILLE, DOB 06/20/1940, MRN [id number]  Primary Cardiologist: Lonni Cash, MD Primary Heart Failure: None Electrophysiologist: Fonda Kitty, MD   {Click to update primary MD,subspecialty MD or APP then REFRESH:1}    History of Present Illness:   Dylan Roth is a 83 y.o. male with h/o AF, SND s/p PPM, CAD s/p CABG, HTN, HLD, BPH seen today for routine electrophysiology followup.   Since last being seen in our clinic the patient reports doing ***.    He*** denies chest pain, palpitations, dyspnea, PND, orthopnea, nausea, vomiting, dizziness, syncope, edema, weight gain, or early satiety.   Review of systems complete and found to be negative unless listed in HPI.    EP Information / Studies Reviewed:    EKG is not ordered today. EKG from 01/11/24 reviewed which showed AV dual paced 71 bpm      PPM Interrogation-  reviewed in detail today,  See PACEART report.  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  If not, click here to update score.  REFRESH note.  :1} This indicates a 4.8% annual risk of stroke. The patient's score is based upon: CHF History: 0 HTN History: 1 Diabetes History: 0 Stroke History: 0 Vascular Disease History: 1 Age Score: 2 Gender Score: 0   {This patient has a significant risk of stroke if diagnosed with atrial fibrillation.  Please consider VKA or DOAC agent for anticoagulation if the bleeding risk is acceptable.   You can also use the SmartPhrase .[email or web address] for documentation.   :[id number]} No BP recorded.  {Refresh Note OR Click here to enter BP  :1}***        Physical Exam:   VS:   There were no vitals taken for this visit.   Wt Readings from Last 3 Encounters:  01/11/24 192 lb 3.2 oz (87.2 kg)  12/17/23 190 lb 9.6 oz (86.5 kg)  07/31/23 185 lb (83.9 kg)     GEN: Well nourished, well developed in no acute distress NECK: No JVD; No carotid bruits CARDIAC: {EPRHYTHM:28826}, no murmurs, rubs, gallops RESPIRATORY:  Clear to auscultation without rales, wheezing or rhonchi  ABDOMEN: Soft, non-tender, non-distended EXTREMITIES:  No edema; No deformity   ASSESSMENT AND PLAN:    SND s/p Abbott PPM  -Normal PPM function -See Pace Art report -No changes today  Persistent Atrial Fibrillation  High Risk Medication Monitoring: Amiodarone   CHA2DS2-VASc 4 -OAC for stroke prophylaxis  -amiodarone  200 mg daily  -amio labs > LFT's, TSH normal on 01/02/24 -Coreg  3.125 mg BID   Secondary Hypercoagulable State  -continue Eliquis  5mg  BID, dose reviewed and appropriate by wt / Cr   CAD s/p CABG (2005 x5 vessel) -no anginal symptoms   DOE  -felt to be volume up at 01/11/24 visit in AF clinic, started on lasix  -BNP 270  -SOB better with diuresis ? ***  Disposition:   Follow up with Dr. Kitty {EPFOLLOW LE:71826}  Signed, Daphne Barrack, NP-C, AGACNP-BC [facility] - Electrophysiology  01/27/2024, 10:59 AM  "

## 2024-01-28 ENCOUNTER — Ambulatory Visit: Attending: Pulmonary Disease | Admitting: Cardiology

## 2024-01-28 ENCOUNTER — Encounter: Payer: Self-pay | Admitting: Pulmonary Disease

## 2024-01-28 VITALS — BP 110/66 | HR 70 | Ht 66.0 in | Wt 191.4 lb

## 2024-01-28 DIAGNOSIS — I10 Essential (primary) hypertension: Secondary | ICD-10-CM | POA: Diagnosis not present

## 2024-01-28 DIAGNOSIS — I25119 Atherosclerotic heart disease of native coronary artery with unspecified angina pectoris: Secondary | ICD-10-CM | POA: Diagnosis not present

## 2024-01-28 DIAGNOSIS — I495 Sick sinus syndrome: Secondary | ICD-10-CM | POA: Diagnosis not present

## 2024-01-28 DIAGNOSIS — R0602 Shortness of breath: Secondary | ICD-10-CM | POA: Diagnosis not present

## 2024-01-28 DIAGNOSIS — Z950 Presence of cardiac pacemaker: Secondary | ICD-10-CM

## 2024-01-28 DIAGNOSIS — I4819 Other persistent atrial fibrillation: Secondary | ICD-10-CM

## 2024-01-28 DIAGNOSIS — I480 Paroxysmal atrial fibrillation: Secondary | ICD-10-CM

## 2024-01-28 DIAGNOSIS — D6869 Other thrombophilia: Secondary | ICD-10-CM

## 2024-01-28 LAB — CUP PACEART INCLINIC DEVICE CHECK
Battery Remaining Longevity: 72 mo
Battery Voltage: 2.99 V
Brady Statistic RA Percent Paced: 72 %
Brady Statistic RV Percent Paced: 99.9 %
Date Time Interrogation Session: 20251229141612
Implantable Lead Connection Status: 753985
Implantable Lead Connection Status: 753985
Implantable Lead Implant Date: 20130425
Implantable Lead Implant Date: 20130425
Implantable Lead Location: 753859
Implantable Lead Location: 753860
Implantable Pulse Generator Implant Date: 20230125
Lead Channel Impedance Value: 362.5 Ohm
Lead Channel Impedance Value: 375 Ohm
Lead Channel Pacing Threshold Amplitude: 0.5 V
Lead Channel Pacing Threshold Amplitude: 1 V
Lead Channel Pacing Threshold Amplitude: 1 V
Lead Channel Pacing Threshold Pulse Width: 0.4 ms
Lead Channel Pacing Threshold Pulse Width: 0.4 ms
Lead Channel Pacing Threshold Pulse Width: 0.4 ms
Lead Channel Sensing Intrinsic Amplitude: 2.8 mV
Lead Channel Setting Pacing Amplitude: 1.25 V
Lead Channel Setting Pacing Amplitude: 1.5 V
Lead Channel Setting Pacing Pulse Width: 0.4 ms
Lead Channel Setting Sensing Sensitivity: 0.5 mV
Pulse Gen Model: 2272
Pulse Gen Serial Number: 3997722

## 2024-01-28 NOTE — Patient Instructions (Addendum)
 Medication Instructions:  None  *If you need a refill on your cardiac medications before your next appointment, please call your pharmacy*  Lab Work: None  If you have labs (blood work) drawn today and your tests are completely normal, you will receive your results only by: MyChart Message (if you have MyChart) OR A paper copy in the mail If you have any lab test that is abnormal or we need to change your treatment, we will call you to review the results.  Testing/Procedures: Echocardiogram Your physician has requested that you have an echocardiogram. Echocardiography is a painless test that uses sound waves to create images of your heart. It provides your doctor with information about the size and shape of your heart and how well your hearts chambers and valves are working. This procedure takes approximately one hour. There are no restrictions for this procedure. Please do NOT wear cologne, perfume, aftershave, or lotions (deodorant is allowed). Please arrive 15 minutes prior to your appointment time.  Please note: We ask at that you not bring children with you during ultrasound (echo/ vascular) testing. Due to room size and safety concerns, children are not allowed in the ultrasound rooms during exams. Our front office staff cannot provide observation of children in our lobby area while testing is being conducted. An adult accompanying a patient to their appointment will only be allowed in the ultrasound room at the discretion of the ultrasound technician under special circumstances. We apologize for any inconvenience.     Chest X-Ray Chest xray - Your physician has requested that you have a chest xray, is a fast and painless imaging test that uses certain electromagnetic waves to create pictures of the structures in and around your chest. This test can help diagnose and monitor conditions such as pneumonia and other lung issues his will be done at 1)  [facility] [address],  [place]. If you should need to call them their phone number is [phone]    2)  [facility]: [address] [place], [place] [address] [phone]   Cardioversion     Dear Dylan Roth  You are scheduled for a Cardioversion on Friday, January 2 with Dr. Francyne  Please arrive at the [facility] (Main Entrance A) at [facility]: [address] [place], [place] [address] at 10:00 AM (This time is 1 hour(s) before your procedure to ensure your preparation).   Free valet parking service is available. You will check in at ADMITTING.   *Please Note: You will receive a call the day before your procedure to confirm the appointment time. That time may have changed from the original time based on the schedule for that day.*  DIET:  Nothing to eat or drink after midnight except a sip of water  with medications (see medication instructions below)  MEDICATION INSTRUCTIONS: Continue taking your anticoagulant (blood thinner): Apixaban  (Eliquis ).  You will need to continue this after your procedure until you are told by your provider that it is safe to stop.    LABS: Current last done 01/02/24  FYI:  For your safety, and to allow us  to monitor your vital signs accurately during the surgery/procedure we request: If you have artificial nails, gel coating, SNS etc, please have those removed prior to your surgery/procedure. Not having the nail coverings /polish removed may result in cancellation or delay of your surgery/procedure.  Your support person will be asked to wait in the waiting room during your procedure.  It is OK to  have someone drop you off and come back when you are ready to be discharged.  You cannot drive after the procedure and will need someone to drive you home.  Bring your insurance cards.  *Special Note: Every effort is made to have your procedure done on time. Occasionally there are emergencies that occur at the hospital that may cause delays. Please be  patient if a delay does occur.      Follow-Up: At [facility], you and your health needs are our priority.  As part of our continuing mission to provide you with exceptional heart care, our providers are all part of one team.  This team includes your primary Cardiologist (physician) and Advanced Practice Providers or APPs (Physician Assistants and Nurse Practitioners) who all work together to provide you with the care you need, when you need it.  Your next appointment:   1 month(s)  Provider:  Artist Pouch, PA   1 month with Afib Clinic We recommend signing up for the patient portal called MyChart.  Sign up information is provided on this After Visit Summary.  MyChart is used to connect with patients for Virtual Visits (Telemedicine).  Patients are able to view lab/test results, encounter notes, upcoming appointments, etc.  Non-urgent messages can be sent to your provider as well.   To learn more about what you can do with MyChart, go to [email or web address].   Other Instructions None

## 2024-01-28 NOTE — H&P (View-Only) (Signed)
 " Electrophysiology Office Note:   ID:  Dylan Roth 1941/01/18, MRN [id number]  Primary Cardiologist: Lonni Cash, MD Electrophysiologist: Fonda Kitty, MD      History of Present Illness:   Dylan Roth is a 83 y.o. male with h/o AF, SND s/p PPM, CAD s/p CABG, HTN, HLD, BPH seen today for routine electrophysiology followup.   Patient previously on Sotalol , noted occasional palpitations and fluttering sensation along with intermittent dyspnea and fatigue at visit with Dylan Roth in July of this year. Subsequent Paceart report showed afib burden up to 14%, longest episode of afib ~2 hrs. Patient was seen in afib clinic in November and decision made to stop Sotalol  and start Amiodarone . When he was seen for ECG follow up on 01/07/24, noted to be in rate controlled afib. DCCV scheduled, ultimately cancelled when patient found to be in NSR on day of cardioversion. Despite being in sinus rhythm, patient reported ongoing exertional dyspnea. A BNP was checked and found elevated to 270. 20mg  of Lasix  x7 days prescribed.   Since last being seen in our clinic the patient reports doing about the same. He continues to report exertional dyspnea, fatigue that was unchanged by 1 week of Lasix . Weight is stable. Onset of these symptoms was around Thanksgiving. Denies other symptoms such as chest pain, PND, orthopnea, nausea, vomiting, dizziness, syncope, edema, weight gain, or early satiety. Reports intermittent and brief sensation of palpitations but no other significant symptoms as previously experienced with arrhythmia.   Review of systems complete and found to be negative unless listed in HPI.   EP Information / Studies Reviewed:    EKG is ordered today. Personal review as below.  EKG Interpretation Date/Time:  Monday January 28 2024 10:58:08 EST Ventricular Rate:  70 PR Interval:    QRS Duration:  132 QT Interval:  440 QTC Calculation: 475 R Axis:   -88  Text  Interpretation: Atrial flutter with Ventricular-paced rhythm Confirmed by Dylan Roth [phone]) on 01/28/2024 11:39:44 AM    PPM Interrogation-  reviewed in detail today,  See PACEART report.  Arrhythmia/Device History PPM-St.Jude-Merlin- WC   Physical Exam:   VS:  BP 110/66   Pulse 70   Ht 5' 6 (1.676 m)   Wt 191 lb 6.4 oz (86.8 kg)   SpO2 97%   BMI 30.89 kg/m    Wt Readings from Last 3 Encounters:  01/28/24 191 lb 6.4 oz (86.8 kg)  01/11/24 192 lb 3.2 oz (87.2 kg)  12/17/23 190 lb 9.6 oz (86.5 kg)     GEN: No acute distress  NECK: No JVD; No carotid bruits CARDIAC: Irregularly irregular rate and rhythm, no murmurs, rubs, gallops RESPIRATORY:  faint right lower lobe crackles.  ABDOMEN: Soft, non-tender, non-distended EXTREMITIES:  No edema; No deformity   ASSESSMENT AND PLAN:    SND s/p Abbott PPM  Normal PPM function. No R waves at 45bpm today. Stable ventricular threshold. Unable to check atrial threshold due to active atrial flutter.  See Dylan Roth report No changes today  Paroxysmal atrial fibrillation and flutter Secondary hypercoagulable state Increased afib burden over the past 6 months. Interrogation today shows burden 28%. Actively in atrial flutter today, appears episode started on 12/27. Although patient historically symptomatic in afib, reports no significant symptoms today. Will plan for DCCV this week. Continue Amiodarone  200mg  daily and Eliquis  5mg  BID. Afib clinic follow up in 1 month. If he continues to have refractory afib, could consider discussing afib ablation.  Informed Consent  Shared Decision Making/Informed Consent The risks (stroke, cardiac arrhythmias rarely resulting in the need for a temporary or permanent pacemaker, skin irritation or burns and complications associated with conscious sedation including aspiration, arrhythmia, respiratory failure and death), benefits (restoration of normal sinus rhythm) and alternatives of a direct current  cardioversion were explained in detail to Dylan Roth and he agrees to proceed.       Exertional dyspnea Patient with increased exertional dyspnea since Thanksgiving. No significant improvement with lasix . Faint right lower lobe crackles on exam today but appears euvolemic without peripheral edema. Will check CXR, echocardiogram, and request general cardiology follow up.   CAD Patient s/p CABG. No anginal symptoms reported, though is having exertional dyspnea. Overall concern that this could be anginal equivalent is low. Continue Atorvastatin , Ranexa , Imdur .   Hypertension Blood pressure is well-controlled on current antihypertensive regimen. Continue current medications and dosing.       Disposition:   Follow up with Afib Clinic in 4 weeks, general cardiology in 4 weeks.   Signed, Dylan Pouch, PA-C  "

## 2024-01-28 NOTE — Progress Notes (Signed)
 " Electrophysiology Office Note:   ID:  Dylan, Roth 1941/01/18, MRN [id number]  Primary Cardiologist: Lonni Cash, MD Electrophysiologist: Fonda Kitty, MD      History of Present Illness:   Dylan Roth is a 83 y.o. male with h/o AF, SND s/p PPM, CAD s/p CABG, HTN, HLD, BPH seen today for routine electrophysiology followup.   Patient previously on Sotalol , noted occasional palpitations and fluttering sensation along with intermittent dyspnea and fatigue at visit with Dr. Kitty in July of this year. Subsequent Paceart report showed afib burden up to 14%, longest episode of afib ~2 hrs. Patient was seen in afib clinic in November and decision made to stop Sotalol  and start Amiodarone . When he was seen for ECG follow up on 01/07/24, noted to be in rate controlled afib. DCCV scheduled, ultimately cancelled when patient found to be in NSR on day of cardioversion. Despite being in sinus rhythm, patient reported ongoing exertional dyspnea. A BNP was checked and found elevated to 270. 20mg  of Lasix  x7 days prescribed.   Since last being seen in our clinic the patient reports doing about the same. He continues to report exertional dyspnea, fatigue that was unchanged by 1 week of Lasix . Weight is stable. Onset of these symptoms was around Thanksgiving. Denies other symptoms such as chest pain, PND, orthopnea, nausea, vomiting, dizziness, syncope, edema, weight gain, or early satiety. Reports intermittent and brief sensation of palpitations but no other significant symptoms as previously experienced with arrhythmia.   Review of systems complete and found to be negative unless listed in HPI.   EP Information / Studies Reviewed:    EKG is ordered today. Personal review as below.  EKG Interpretation Date/Time:  Monday January 28 2024 10:58:08 EST Ventricular Rate:  70 PR Interval:    QRS Duration:  132 QT Interval:  440 QTC Calculation: 475 R Axis:   -88  Text  Interpretation: Atrial flutter with Ventricular-paced rhythm Confirmed by Trudy Birmingham [phone]) on 01/28/2024 11:39:44 AM    PPM Interrogation-  reviewed in detail today,  See PACEART report.  Arrhythmia/Device History PPM-St.Jude-Merlin- WC   Physical Exam:   VS:  BP 110/66   Pulse 70   Ht 5' 6 (1.676 m)   Wt 191 lb 6.4 oz (86.8 kg)   SpO2 97%   BMI 30.89 kg/m    Wt Readings from Last 3 Encounters:  01/28/24 191 lb 6.4 oz (86.8 kg)  01/11/24 192 lb 3.2 oz (87.2 kg)  12/17/23 190 lb 9.6 oz (86.5 kg)     GEN: No acute distress  NECK: No JVD; No carotid bruits CARDIAC: Irregularly irregular rate and rhythm, no murmurs, rubs, gallops RESPIRATORY:  faint right lower lobe crackles.  ABDOMEN: Soft, non-tender, non-distended EXTREMITIES:  No edema; No deformity   ASSESSMENT AND PLAN:    SND s/p Abbott PPM  Normal PPM function. No R waves at 45bpm today. Stable ventricular threshold. Unable to check atrial threshold due to active atrial flutter.  See Elisabeth Art report No changes today  Paroxysmal atrial fibrillation and flutter Secondary hypercoagulable state Increased afib burden over the past 6 months. Interrogation today shows burden 28%. Actively in atrial flutter today, appears episode started on 12/27. Although patient historically symptomatic in afib, reports no significant symptoms today. Will plan for DCCV this week. Continue Amiodarone  200mg  daily and Eliquis  5mg  BID. Afib clinic follow up in 1 month. If he continues to have refractory afib, could consider discussing afib ablation.  Informed Consent  Shared Decision Making/Informed Consent The risks (stroke, cardiac arrhythmias rarely resulting in the need for a temporary or permanent pacemaker, skin irritation or burns and complications associated with conscious sedation including aspiration, arrhythmia, respiratory failure and death), benefits (restoration of normal sinus rhythm) and alternatives of a direct current  cardioversion were explained in detail to Dylan Roth and he agrees to proceed.       Exertional dyspnea Patient with increased exertional dyspnea since Thanksgiving. No significant improvement with lasix . Faint right lower lobe crackles on exam today but appears euvolemic without peripheral edema. Will check CXR, echocardiogram, and request general cardiology follow up.   CAD Patient s/p CABG. No anginal symptoms reported, though is having exertional dyspnea. Overall concern that this could be anginal equivalent is low. Continue Atorvastatin , Ranexa , Imdur .   Hypertension Blood pressure is well-controlled on current antihypertensive regimen. Continue current medications and dosing.       Disposition:   Follow up with Afib Clinic in 4 weeks, general cardiology in 4 weeks.   Signed, Artist Pouch, PA-C  "

## 2024-02-01 ENCOUNTER — Encounter (HOSPITAL_COMMUNITY): Payer: Self-pay

## 2024-02-01 ENCOUNTER — Encounter (HOSPITAL_COMMUNITY): Admission: RE | Disposition: A | Payer: Self-pay | Source: Home / Self Care | Attending: Cardiovascular Disease

## 2024-02-01 ENCOUNTER — Ambulatory Visit (HOSPITAL_COMMUNITY): Admission: RE | Admit: 2024-02-01 | Source: Home / Self Care | Admitting: Cardiovascular Disease

## 2024-02-01 DIAGNOSIS — I480 Paroxysmal atrial fibrillation: Secondary | ICD-10-CM

## 2024-02-01 DIAGNOSIS — I4819 Other persistent atrial fibrillation: Secondary | ICD-10-CM

## 2024-02-01 DIAGNOSIS — I495 Sick sinus syndrome: Secondary | ICD-10-CM | POA: Diagnosis present

## 2024-02-01 SURGERY — CARDIOVERSION (CATH LAB)
Anesthesia: Monitor Anesthesia Care

## 2024-02-01 NOTE — Progress Notes (Signed)
 Patient presented in normal rhythm (mostly A sensed sinus rhythm, V paced; occasional ApVp). Procedure canceled. Pacemaker interrogation shows high prevalence of paroxysmal atrial fibrillation, as well as frequent brief episodes of atrial oversensing/noise reversion. Otherwise,  normal device function.

## 2024-02-01 NOTE — Interval H&P Note (Signed)
 History and Physical Interval Note:  02/01/2024 9:20 AM  Dylan Roth  has presented today for surgery, with the diagnosis of AFIB.  The various methods of treatment have been discussed with the patient and family. After consideration of risks, benefits and other options for treatment, the patient has consented to  Procedures: CARDIOVERSION (N/A) as a surgical intervention.  The patient's history has been reviewed, patient examined, no change in status, stable for surgery.  I have reviewed the patient's chart and labs.  Questions were answered to the patient's satisfaction.     Trenden Hazelrigg

## 2024-02-02 ENCOUNTER — Ambulatory Visit: Payer: Self-pay | Admitting: Cardiology

## 2024-02-08 ENCOUNTER — Encounter: Payer: Self-pay | Admitting: Cardiology

## 2024-02-18 NOTE — Progress Notes (Unsigned)
 " Electrophysiology Office Note:   Date:  02/20/2024  ID:  Dylan Roth, DOB [birth year], MRN [id number]  Primary Cardiologist: Lonni Cash, MD Electrophysiologist: Fonda Kitty, MD      History of Present Illness:   Dylan Roth is a 84 y.o. male with h/o CAD s/p CABG x 5 2005, HTN, HLD, obesity, paroxysmal AF/flutter, sinus node dysfunction s/p PPM who is being seen today for pacemaker follow up.  Discussed the use of AI scribe software for clinical note transcription with the patient, who gave verbal consent to proceed.  History of Present Illness Dylan Roth is an 84 year old male with atrial fibrillation who presents with increased AFib burden and shortness of breath. He is accompanied by his daughters. He was referred by Artist, a PA, for evaluation of increased AFib burden.  He has a history of atrial fibrillation with an increased burden, now recorded at 28% of the time. He experiences shortness of breath and reduced ability to perform physical activities. These symptoms began between Thanksgiving and Christmas, coinciding with weight gain and bloating. He is on amiodarone  but continues to have AFib episodes.  He experiences shortness of breath, particularly during walking or physical exertion. He feels 'out of breath a good bit' and notes worsening symptoms over the past six to twelve months.  He has experienced falls, including a recent fall on his left shoulder, described as a 'boulder fall.' He has a history of knee replacement and difficulty urinating post-surgery, which he attributes to prostate issues.  He is currently on amiodarone  for AFib management. He reports symptoms such as fluid retention and exertional intolerance.  Review of systems complete and found to be negative unless listed in HPI.   EP Information / Studies Reviewed:    EKG is ordered today. Personal review as below.  EKG Interpretation Date/Time:  Tuesday February 19 2024 11:52:35  EST Ventricular Rate:  70 PR Interval:    QRS Duration:  136 QT Interval:  448 QTC Calculation: 483 R Axis:   -85  Text Interpretation: Ventricular-paced rhythm When compared with ECG of 28-Jan-2024 10:58, No significant change was found Confirmed by Kitty Fonda [phone]) on 02/20/2024 3:20:56 PM   Echo 12/2022:   1. Left ventricular ejection fraction, by estimation, is 55 to 60%. Left  ventricular ejection fraction by 3D volume is 57 %. The left ventricle has  normal function. The left ventricle has no regional wall motion  abnormalities. There is mild left ventricular hypertrophy of the basal-septal segment. Left ventricular  diastolic parameters are consistent with Grade I diastolic dysfunction  (impaired relaxation).   2. Right ventricular systolic function is mildly reduced. The right  ventricular size is normal. There is normal pulmonary artery systolic  pressure. The estimated right ventricular systolic pressure is 25.7 mmHg.   3. The mitral valve is grossly normal. Trivial mitral valve  regurgitation.   4. The aortic valve is tricuspid. Aortic valve regurgitation trivial to  mild. Aortic valve sclerosis is present, with no evidence of aortic valve  stenosis.   5. Aortic dilatation noted. There is borderline dilatation of the  ascending aorta, measuring 38 mm.   6. The inferior vena cava is normal in size with greater than 50%  respiratory variability, suggesting right atrial pressure of 3 mmHg.    Risk Assessment/Calculations:    CHA2DS2-VASc Score = 4   This indicates a 4.8% annual risk of stroke. The patient's score is based upon: CHF History: 0 HTN History: 1 Diabetes  History: 0 Stroke History: 0 Vascular Disease History: 1 Age Score: 2 Gender Score: 0             Physical Exam:   VS:  BP 130/84   Pulse 70   Ht 5' 6 (1.676 m)   Wt 188 lb (85.3 kg)   SpO2 97%   BMI 30.34 kg/m    Wt Readings from Last 3 Encounters:  02/19/24 188 lb (85.3 kg)   01/28/24 191 lb 6.4 oz (86.8 kg)  01/11/24 192 lb 3.2 oz (87.2 kg)     GEN: Well nourished, well developed in no acute distress NECK: No JVD CARDIAC: Normal rate, regular rhythm.  Left chest pacer pocket well-healed. RESPIRATORY:  Clear to auscultation without rales, wheezing or rhonchi  ABDOMEN: Soft, non-distended EXTREMITIES:  No edema; No deformity   ASSESSMENT AND PLAN:    #.  Persistent atrial fibrillation: Burden significantly increased since last visit. In AF today. Continues to have episodes despite amiodarone . Reports more fatigue, dyspnea, abdominal distension / likely fluid retention, suggestive of CHF.  #.  High risk medication use: Amiodarone .  Discussed treatment options today for AF including antiarrhythmic drug therapy and ablation. Discussed risks, recovery and likelihood of success with each treatment strategy. Risk, benefits, and alternatives to EP study and ablation for afib were discussed. These risks include but are not limited to stroke, bleeding, vascular damage, tamponade, perforation, damage to the esophagus, lungs, phrenic nerve and other structures, pulmonary vein stenosis, worsening renal function, coronary vasospasm and death.  Discussed potential need for repeat ablation procedures and antiarrhythmic drugs after an initial ablation. The patient understands these risk and wishes to proceed.  We will therefore proceed with catheter ablation at the next available time.  Carto, ICE, anesthesia are requested for the procedure.   We will obtain CT PV protocol prior to the procedure. - Continue amiodarone  as bridge to ablation.  - Continue monitoring AF burden on his device.  - Repeat echocardiogram to assess LVEF.  #.  Secondary hypercoagulable state due to atrial fibrillation: - Continue Eliquis  5 mg twice daily.  #. Sinus node dysfunction s/p pacemaker:  - In-clinic device interrogation performed today.  Appropriate device function and stable lead parameters.   Presenting rhythm is AF / V paced.  He has high VP burden. He has symptoms suggestive of CHF, either secondary to AF or V pacing. Will need to update echo. If LVEF reduced then will need upgrade to CRT. - Continue remote monitoring.  #. Chronic diastolic heart failure: Likely worsened by increasing AF burden.  - Echo scheduled for 03/03/24 - High VP burden so need to ensure no component of pacing induced cardiomyopathy.  #.  CAD status post CABG: - Continue atorvastatin  80 mg daily, ranolazine  500 mg twice daily, Imdur  30 mg twice daily.   Follow up with Dr. Kennyth as usual post procedure  Signed, Fonda Kennyth, MD  "

## 2024-02-19 ENCOUNTER — Encounter: Payer: Self-pay | Admitting: Cardiology

## 2024-02-19 ENCOUNTER — Ambulatory Visit: Attending: Cardiology | Admitting: Cardiology

## 2024-02-19 VITALS — BP 130/84 | HR 70 | Ht 66.0 in | Wt 188.0 lb

## 2024-02-19 DIAGNOSIS — D6869 Other thrombophilia: Secondary | ICD-10-CM

## 2024-02-19 DIAGNOSIS — I5032 Chronic diastolic (congestive) heart failure: Secondary | ICD-10-CM

## 2024-02-19 DIAGNOSIS — I480 Paroxysmal atrial fibrillation: Secondary | ICD-10-CM

## 2024-02-19 DIAGNOSIS — I4819 Other persistent atrial fibrillation: Secondary | ICD-10-CM

## 2024-02-19 DIAGNOSIS — Z950 Presence of cardiac pacemaker: Secondary | ICD-10-CM

## 2024-02-19 DIAGNOSIS — I25119 Atherosclerotic heart disease of native coronary artery with unspecified angina pectoris: Secondary | ICD-10-CM | POA: Diagnosis not present

## 2024-02-19 DIAGNOSIS — I495 Sick sinus syndrome: Secondary | ICD-10-CM

## 2024-02-19 LAB — CUP PACEART INCLINIC DEVICE CHECK
Battery Remaining Longevity: 79 mo
Battery Voltage: 2.99 V
Brady Statistic RA Percent Paced: 18 %
Brady Statistic RV Percent Paced: 99.97 %
Date Time Interrogation Session: 20260120122118
Implantable Lead Connection Status: 753985
Implantable Lead Connection Status: 753985
Implantable Lead Implant Date: 20130425
Implantable Lead Implant Date: 20130425
Implantable Lead Location: 753859
Implantable Lead Location: 753860
Implantable Pulse Generator Implant Date: 20230125
Lead Channel Pacing Threshold Amplitude: 0.625 V
Lead Channel Pacing Threshold Amplitude: 1 V
Lead Channel Pacing Threshold Pulse Width: 0.4 ms
Lead Channel Pacing Threshold Pulse Width: 0.4 ms
Lead Channel Sensing Intrinsic Amplitude: 0.9 mV
Lead Channel Sensing Intrinsic Amplitude: 5 mV
Lead Channel Setting Pacing Amplitude: 1.25 V
Lead Channel Setting Pacing Amplitude: 1.625
Lead Channel Setting Pacing Pulse Width: 0.4 ms
Lead Channel Setting Sensing Sensitivity: 0.5 mV
Pulse Gen Model: 2272
Pulse Gen Serial Number: 3997722

## 2024-02-19 NOTE — Patient Instructions (Signed)
 Medication Instructions:  Your physician recommends that you continue on your current medications as directed. Please refer to the Current Medication list given to you today.  *If you need a refill on your cardiac medications before your next appointment, please call your pharmacy*  Testing/Procedures: Ablation Your physician has recommended that you have an ablation. Catheter ablation is a medical procedure used to treat some cardiac arrhythmias (irregular heartbeats). During catheter ablation, a long, thin, flexible tube is put into a blood vessel in your groin (upper thigh), or neck. This tube is called an ablation catheter. It is then guided to your heart through the blood vessel. Radio frequency waves destroy small areas of heart tissue where abnormal heartbeats may cause an arrhythmia to start.   You are scheduled for Atrial Fibrillation Ablation on Friday, February 27 with Dr. Dr. Kennyth. Please arrive at the Main Entrance A at [facility]: [address] [place], [place] [address] at 9:30 AM   What To Expect:  Labs: you will need to have lab work drawn within 30 days of your procedure. Please go to any [facility] location to have these drawn - no appointment is needed. Cardiac CT Scan: this will be done about 3-4 weeks prior to your procedure. You will be contacted to schedule this test. You will receive procedure instructions either through MyChart or in the mail 4-6 week prior to your procedure.  After your procedure we recommend no driving for 4 days, no lifting over 5 lbs for 7 days, and no work or strenuous activity for 7 days.  Please contact our office at [phone] if you have any questions.    Follow-Up: We will contact you to schedule your post-procedure appointments.

## 2024-02-20 ENCOUNTER — Ambulatory Visit

## 2024-02-20 DIAGNOSIS — I495 Sick sinus syndrome: Secondary | ICD-10-CM | POA: Diagnosis not present

## 2024-02-21 ENCOUNTER — Ambulatory Visit: Payer: Self-pay | Admitting: Cardiology

## 2024-02-21 LAB — CUP PACEART REMOTE DEVICE CHECK
Battery Remaining Longevity: 79 mo
Battery Remaining Percentage: 67 %
Battery Voltage: 2.99 V
Brady Statistic AP VP Percent: 0 %
Brady Statistic AP VS Percent: 0 %
Brady Statistic AS VP Percent: 0 %
Brady Statistic AS VS Percent: 0 %
Brady Statistic RA Percent Paced: 0 %
Brady Statistic RV Percent Paced: 99 %
Date Time Interrogation Session: 20260121020014
Implantable Lead Connection Status: 753985
Implantable Lead Connection Status: 753985
Implantable Lead Implant Date: 20130425
Implantable Lead Implant Date: 20130425
Implantable Lead Location: 753859
Implantable Lead Location: 753860
Implantable Pulse Generator Implant Date: 20230125
Lead Channel Impedance Value: 360 Ohm
Lead Channel Impedance Value: 380 Ohm
Lead Channel Pacing Threshold Amplitude: 0.625 V
Lead Channel Pacing Threshold Amplitude: 1 V
Lead Channel Pacing Threshold Pulse Width: 0.4 ms
Lead Channel Pacing Threshold Pulse Width: 0.4 ms
Lead Channel Sensing Intrinsic Amplitude: 2.3 mV
Lead Channel Sensing Intrinsic Amplitude: 4.1 mV
Lead Channel Setting Pacing Amplitude: 1.25 V
Lead Channel Setting Pacing Amplitude: 1.625
Lead Channel Setting Pacing Pulse Width: 0.4 ms
Lead Channel Setting Sensing Sensitivity: 0.5 mV
Pulse Gen Model: 2272
Pulse Gen Serial Number: 3997722

## 2024-02-22 ENCOUNTER — Emergency Department (HOSPITAL_COMMUNITY)

## 2024-02-22 ENCOUNTER — Other Ambulatory Visit: Payer: Self-pay

## 2024-02-22 ENCOUNTER — Encounter: Payer: Self-pay | Admitting: Cardiology

## 2024-02-22 ENCOUNTER — Encounter (HOSPITAL_COMMUNITY): Payer: Self-pay | Admitting: Emergency Medicine

## 2024-02-22 ENCOUNTER — Observation Stay (HOSPITAL_COMMUNITY)
Admission: EM | Admit: 2024-02-22 | Discharge: 2024-02-26 | DRG: 291 | Disposition: A | Attending: Internal Medicine | Admitting: Internal Medicine

## 2024-02-22 DIAGNOSIS — E871 Hypo-osmolality and hyponatremia: Secondary | ICD-10-CM | POA: Diagnosis not present

## 2024-02-22 DIAGNOSIS — I4892 Unspecified atrial flutter: Secondary | ICD-10-CM | POA: Diagnosis present

## 2024-02-22 DIAGNOSIS — I447 Left bundle-branch block, unspecified: Secondary | ICD-10-CM | POA: Diagnosis present

## 2024-02-22 DIAGNOSIS — I495 Sick sinus syndrome: Secondary | ICD-10-CM | POA: Diagnosis present

## 2024-02-22 DIAGNOSIS — Z87891 Personal history of nicotine dependence: Secondary | ICD-10-CM

## 2024-02-22 DIAGNOSIS — I130 Hypertensive heart and chronic kidney disease with heart failure and stage 1 through stage 4 chronic kidney disease, or unspecified chronic kidney disease: Principal | ICD-10-CM | POA: Diagnosis present

## 2024-02-22 DIAGNOSIS — Z6829 Body mass index (BMI) 29.0-29.9, adult: Secondary | ICD-10-CM

## 2024-02-22 DIAGNOSIS — I5033 Acute on chronic diastolic (congestive) heart failure: Secondary | ICD-10-CM | POA: Diagnosis not present

## 2024-02-22 DIAGNOSIS — N1831 Chronic kidney disease, stage 3a: Secondary | ICD-10-CM | POA: Diagnosis present

## 2024-02-22 DIAGNOSIS — I480 Paroxysmal atrial fibrillation: Secondary | ICD-10-CM

## 2024-02-22 DIAGNOSIS — Z7901 Long term (current) use of anticoagulants: Secondary | ICD-10-CM

## 2024-02-22 DIAGNOSIS — I4891 Unspecified atrial fibrillation: Secondary | ICD-10-CM | POA: Diagnosis present

## 2024-02-22 DIAGNOSIS — K589 Irritable bowel syndrome without diarrhea: Secondary | ICD-10-CM | POA: Diagnosis present

## 2024-02-22 DIAGNOSIS — Z79899 Other long term (current) drug therapy: Secondary | ICD-10-CM

## 2024-02-22 DIAGNOSIS — I4819 Other persistent atrial fibrillation: Secondary | ICD-10-CM | POA: Diagnosis present

## 2024-02-22 DIAGNOSIS — I252 Old myocardial infarction: Secondary | ICD-10-CM

## 2024-02-22 DIAGNOSIS — N400 Enlarged prostate without lower urinary tract symptoms: Secondary | ICD-10-CM | POA: Diagnosis present

## 2024-02-22 DIAGNOSIS — I10 Essential (primary) hypertension: Secondary | ICD-10-CM | POA: Diagnosis present

## 2024-02-22 DIAGNOSIS — F32A Depression, unspecified: Secondary | ICD-10-CM | POA: Diagnosis present

## 2024-02-22 DIAGNOSIS — Z96653 Presence of artificial knee joint, bilateral: Secondary | ICD-10-CM | POA: Diagnosis present

## 2024-02-22 DIAGNOSIS — I509 Heart failure, unspecified: Principal | ICD-10-CM

## 2024-02-22 DIAGNOSIS — M109 Gout, unspecified: Secondary | ICD-10-CM | POA: Diagnosis present

## 2024-02-22 DIAGNOSIS — E785 Hyperlipidemia, unspecified: Secondary | ICD-10-CM | POA: Diagnosis present

## 2024-02-22 DIAGNOSIS — Z9841 Cataract extraction status, right eye: Secondary | ICD-10-CM

## 2024-02-22 DIAGNOSIS — Z9842 Cataract extraction status, left eye: Secondary | ICD-10-CM

## 2024-02-22 DIAGNOSIS — Z1152 Encounter for screening for COVID-19: Secondary | ICD-10-CM

## 2024-02-22 DIAGNOSIS — M549 Dorsalgia, unspecified: Secondary | ICD-10-CM | POA: Diagnosis present

## 2024-02-22 DIAGNOSIS — I2510 Atherosclerotic heart disease of native coronary artery without angina pectoris: Secondary | ICD-10-CM | POA: Diagnosis present

## 2024-02-22 DIAGNOSIS — Z951 Presence of aortocoronary bypass graft: Secondary | ICD-10-CM

## 2024-02-22 DIAGNOSIS — Z961 Presence of intraocular lens: Secondary | ICD-10-CM | POA: Diagnosis present

## 2024-02-22 DIAGNOSIS — Z950 Presence of cardiac pacemaker: Secondary | ICD-10-CM

## 2024-02-22 DIAGNOSIS — I340 Nonrheumatic mitral (valve) insufficiency: Secondary | ICD-10-CM | POA: Diagnosis present

## 2024-02-22 DIAGNOSIS — Z823 Family history of stroke: Secondary | ICD-10-CM

## 2024-02-22 DIAGNOSIS — N179 Acute kidney failure, unspecified: Secondary | ICD-10-CM | POA: Diagnosis present

## 2024-02-22 DIAGNOSIS — G8929 Other chronic pain: Secondary | ICD-10-CM | POA: Diagnosis present

## 2024-02-22 DIAGNOSIS — Z882 Allergy status to sulfonamides status: Secondary | ICD-10-CM

## 2024-02-22 DIAGNOSIS — R0602 Shortness of breath: Secondary | ICD-10-CM

## 2024-02-22 LAB — COMPREHENSIVE METABOLIC PANEL WITH GFR
ALT: 32 U/L (ref 0–44)
AST: 32 U/L (ref 15–41)
Albumin: 3.9 g/dL (ref 3.5–5.0)
Alkaline Phosphatase: 116 U/L (ref 38–126)
Anion gap: 10 (ref 5–15)
BUN: 27 mg/dL — ABNORMAL HIGH (ref 8–23)
CO2: 24 mmol/L (ref 22–32)
Calcium: 8.6 mg/dL — ABNORMAL LOW (ref 8.9–10.3)
Chloride: 99 mmol/L (ref 98–111)
Creatinine, Ser: 1.29 mg/dL — ABNORMAL HIGH (ref 0.61–1.24)
GFR, Estimated: 55 mL/min — ABNORMAL LOW
Glucose, Bld: 100 mg/dL — ABNORMAL HIGH (ref 70–99)
Potassium: 5 mmol/L (ref 3.5–5.1)
Sodium: 134 mmol/L — ABNORMAL LOW (ref 135–145)
Total Bilirubin: 0.6 mg/dL (ref 0.0–1.2)
Total Protein: 6.7 g/dL (ref 6.5–8.1)

## 2024-02-22 LAB — CBC WITH DIFFERENTIAL/PLATELET
Abs Immature Granulocytes: 0.09 K/uL — ABNORMAL HIGH (ref 0.00–0.07)
Basophils Absolute: 0 K/uL (ref 0.0–0.1)
Basophils Relative: 0 %
Eosinophils Absolute: 0.1 K/uL (ref 0.0–0.5)
Eosinophils Relative: 1 %
HCT: 37.1 % — ABNORMAL LOW (ref 39.0–52.0)
Hemoglobin: 12.5 g/dL — ABNORMAL LOW (ref 13.0–17.0)
Immature Granulocytes: 2 %
Lymphocytes Relative: 18 %
Lymphs Abs: 1 K/uL (ref 0.7–4.0)
MCH: 33.7 pg (ref 26.0–34.0)
MCHC: 33.7 g/dL (ref 30.0–36.0)
MCV: 100 fL (ref 80.0–100.0)
Monocytes Absolute: 0.4 K/uL (ref 0.1–1.0)
Monocytes Relative: 8 %
Neutro Abs: 4.1 K/uL (ref 1.7–7.7)
Neutrophils Relative %: 71 %
Platelets: 168 K/uL (ref 150–400)
RBC: 3.71 MIL/uL — ABNORMAL LOW (ref 4.22–5.81)
RDW: 14.2 % (ref 11.5–15.5)
WBC: 5.7 K/uL (ref 4.0–10.5)
nRBC: 0 % (ref 0.0–0.2)

## 2024-02-22 LAB — RESP PANEL BY RT-PCR (RSV, FLU A&B, COVID)  RVPGX2
Influenza A by PCR: NEGATIVE
Influenza B by PCR: NEGATIVE
Resp Syncytial Virus by PCR: NEGATIVE
SARS Coronavirus 2 by RT PCR: NEGATIVE

## 2024-02-22 LAB — PRO BRAIN NATRIURETIC PEPTIDE: Pro Brain Natriuretic Peptide: 1419 pg/mL — ABNORMAL HIGH

## 2024-02-22 LAB — TROPONIN T, HIGH SENSITIVITY: Troponin T High Sensitivity: 44 ng/L — ABNORMAL HIGH (ref 0–19)

## 2024-02-22 MED ORDER — FUROSEMIDE 10 MG/ML IJ SOLN
40.0000 mg | Freq: Once | INTRAMUSCULAR | Status: AC
  Administered 2024-02-22: 40 mg via INTRAVENOUS
  Filled 2024-02-22: qty 4

## 2024-02-22 NOTE — ED Notes (Signed)
 Provider indicated that pt could go to the lobby after labs.

## 2024-02-22 NOTE — ED Provider Notes (Incomplete)
 Patient is an 84 year old male with history of heart issues presenting today with worsening shortness of breath over the last month.  He has dyspnea on exertion and now cannot even tie his shoes without feeling short of breath.  Also complaining of abdominal distention.  Labs are concerning with fluid overload.  Mild AKI and bumped troponin today.  Low suspicion for infection or PE.  Patient given IV Lasix  for diuresis and will need admission and echo.  CRITICAL CARE Performed by: Elizabeth Paulsen Total critical care time: 30 minutes Critical care time was exclusive of separately billable procedures and treating other patients. Critical care was necessary to treat or prevent imminent or life-threatening deterioration. Critical care was time spent personally by me on the following activities: development of treatment plan with patient and/or surrogate as well as nursing, discussions with consultants, evaluation of patient's response to treatment, examination of patient, obtaining history from patient or surrogate, ordering and performing treatments and interventions, ordering and review of laboratory studies, ordering and review of radiographic studies, pulse oximetry and re-evaluation of patient's condition.

## 2024-02-22 NOTE — ED Triage Notes (Signed)
 Per EMS, pt from home pt has had increased SOB since christmas.  Has been seeing heart provider and has scheduled a exploratory cath and ablation.  Pt has a pacemaker which can be seen in EKG. Hx of afib.  No Pain at all.  He was talking w/ family which seemed to worry him about the situation and advised him to go to the hospital.  132/80 98% RA 72 paced on monitor  PA bedside

## 2024-02-22 NOTE — ED Notes (Addendum)
 Lab to add troponin to previously collected ED-PA aware.

## 2024-02-22 NOTE — ED Notes (Signed)
EKG done 2031

## 2024-02-22 NOTE — ED Provider Triage Note (Signed)
 Emergency Medicine Provider Triage Evaluation Note  Dylan Roth , a 84 y.o. male  was evaluated in triage.  Pt complains of shortness of breath since Christmas . No chest pain.  Has had a productive cough, no other URI symptoms or fevers.  Follows with cardiology.  Has A-fib on Eliquis .  Plan for cath and ablation later this month.  Review of Systems  Positive: Negative:   Physical Exam  BP 131/69 (BP Location: Left Arm)   Pulse 60   Temp 97.9 F (36.6 C) (Oral)   Resp (!) 21   Ht 5' 6.5 (1.689 m)   Wt 83.9 kg   SpO2 100%   BMI 29.41 kg/m  Gen:   Awake, no distress   Resp:  Normal effort  MSK:   Moves extremities without difficulty  Other:    Medical Decision Making  Medically screening exam initiated at 8:47 PM.  Appropriate orders placed.  Dylan Roth was informed that the remainder of the evaluation will be completed by another provider, this initial triage assessment does not replace that evaluation, and the importance of remaining in the ED until their evaluation is complete.     Dylan Lynwood DEL, PA-C 02/22/24 2049

## 2024-02-22 NOTE — ED Notes (Signed)
 Pt to Xray and then waiting room

## 2024-02-22 NOTE — ED Provider Notes (Signed)
 " [place] [facility] Provider Note   CSN: [id number] Arrival date & time: 02/22/24  2027     Patient presents with: Shortness of Breath   Dylan Roth is a 84 y.o. male with a PMHx notable for CAD s/p CABG x 5 2005, HTN, HLD, obesity, paroxysmal AF/flutter, sinus node dysfunction s/p PPM who presents today for evaluation of shortness of breath.  Patient reports that he has had progressive exertional dyspnea and poor exercise tolerance since Christmas time.  Is now getting to the point where it is affecting his ADLs.  Patient denies any orthopnea or chest pain.  Has been without any fevers, chills, cough. Shortness of Breath      Prior to Admission medications  Medication Sig Start Date End Date Taking? Authorizing Provider  allopurinol  (ZYLOPRIM ) 100 MG tablet Take 100 mg by mouth in the morning.    [provider]  amiodarone  (PACERONE ) 200 MG tablet Take 1 tablet (200 mg total) by mouth daily. 01/11/24 02/19/24  Fenton, Clint R, PA  apixaban  (ELIQUIS ) 5 MG TABS tablet TAKE 1 TABLET TWICE DAILY 11/12/23   Kennyth Chew, MD  atorvastatin  (LIPITOR ) 80 MG tablet Take 80 mg by mouth at bedtime. 02/15/18   [provider]  carvedilol  (COREG ) 3.125 MG tablet TAKE 1 TABLET TWICE DAILY WITH MEALS 11/06/23   Verlin Lonni BIRCH, MD  escitalopram  (LEXAPRO ) 10 MG tablet Take 10 mg by mouth at bedtime. 11/10/19   [provider]  furosemide  (LASIX ) 20 MG tablet Take 1 tablet (20 mg total) by mouth daily. Take for 7 days then stop 01/14/24 02/19/24  Fenton, Clint R, PA  isosorbide  mononitrate (IMDUR ) 30 MG 24 hr tablet TAKE 1 TABLET TWICE DAILY 06/01/23   Fernande Elspeth BROCKS, MD  lisinopril  (ZESTRIL ) 5 MG tablet Take 2.5 mg by mouth daily. 10/18/22   [provider]  MAGNESIUM  PO Take 1 capsule by mouth in the morning.    [provider]  nitroGLYCERIN  (NITROSTAT ) 0.4 MG SL tablet DISSOLVE 1 TABLET UNDER THE TONGUE EVERY  5 MINUTES AS NEEDED FOR CHEST PAIN. DO NOT EXCEED A TOTAL OF 3 DOSES IN 15 MINUTES 10/15/23   Verlin Lonni BIRCH, MD  ranolazine  (RANEXA ) 500 MG 12 hr tablet TAKE 1 TABLET TWICE DAILY 12/19/23   Verlin Lonni BIRCH, MD  tamsulosin  (FLOMAX ) 0.4 MG CAPS capsule Take 0.4 mg by mouth in the morning.    [provider]    Allergies: Sulfa antibiotics, Sulfamethoxazole, and Sulfamethoxazole-trimethoprim    Review of Systems  Respiratory:  Positive for shortness of breath.     Updated Vital Signs BP 131/69 (BP Location: Left Arm)   Pulse 60   Temp 97.9 F (36.6 C) (Oral)   Resp (!) 21   Ht 5' 6.5 (1.689 m)   Wt 83.9 kg   SpO2 100%   BMI 29.41 kg/m   Physical Exam Constitutional:      General: He is not in acute distress.    Appearance: He is not ill-appearing.  HENT:     Head: Normocephalic.  Eyes:     Pupils: Pupils are equal, round, and reactive to light.  Cardiovascular:     Rate and Rhythm: Normal rate and regular rhythm.  Pulmonary:     Effort: Pulmonary effort is normal.     Breath sounds: Examination of the right-lower field reveals decreased breath sounds. Decreased breath sounds present.  Musculoskeletal:        General: Normal  range of motion.     Cervical back: Normal range of motion.  Skin:    General: Skin is warm.     Capillary Refill: Capillary refill takes less than 2 seconds.  Neurological:     General: No focal deficit present.     Mental Status: He is alert.  Psychiatric:        Mood and Affect: Mood normal.     (all labs ordered are listed, but only abnormal results are displayed) Labs Reviewed  CBC WITH DIFFERENTIAL/PLATELET - Abnormal; Notable for the following components:      Result Value   RBC 3.71 (*)    Hemoglobin 12.5 (*)    HCT 37.1 (*)    Abs Immature Granulocytes 0.09 (*)    All other components within normal limits  PRO BRAIN NATRIURETIC PEPTIDE - Abnormal; Notable for the following components:   Pro Brain  Natriuretic Peptide 1,419.0 (*)    All other components within normal limits  COMPREHENSIVE METABOLIC PANEL WITH GFR - Abnormal; Notable for the following components:   Sodium 134 (*)    Glucose, Bld 100 (*)    BUN 27 (*)    Creatinine, Ser 1.29 (*)    Calcium  8.6 (*)    GFR, Estimated 55 (*)    All other components within normal limits  RESP PANEL BY RT-PCR (RSV, FLU A&B, COVID)  RVPGX2  TROPONIN T, HIGH SENSITIVITY    EKG: None  Radiology: DG Chest 2 View Result Date: 02/22/2024 EXAM: 2 VIEW(S) XRAY OF THE CHEST 02/22/2024 09:39:40 PM COMPARISON: Prior examination of 04/06/2023. CLINICAL HISTORY: Shortness of breath. FINDINGS: LINES, TUBES AND DEVICES: Left dual lead pacemaker present. LUNGS AND PLEURA: Right pleural effusion with underlying atelectasis versus consolidation. The right pleural effusion is new from prior examination. No pneumothorax. HEART AND MEDIASTINUM: Cardiomegaly. Calcified aorta. CABG changes noted. Left dual lead pacemaker present. BONES AND SOFT TISSUES: Thoracic degenerative changes. Median sternotomy wires present. No acute osseous abnormality. IMPRESSION: 1. New right pleural effusion with underlying atelectasis versus consolidation. 2. Cardiomegaly Electronically signed by: Dorethia Molt MD 02/22/2024 09:43 PM EST RP Workstation: [id number]  Procedures   Medications Ordered in the ED - No data to display                                Medical Decision Making Risk Prescription drug management. Decision regarding hospitalization.   Patient is an 84 year old male who presents today for evaluation of worsening shortness of breath.  On initial assessment patient was noted to be hemodynamically stable and afebrile.  Oxygen saturation stable on room air.  On the bedside assessment patient was noted be resting comfortably no acute distress.  Slightly diminished lung sounds in the right lower lung.  Bedside ultrasound with evidence of reduced ejection  fraction.  At the time my assessment patient already had laboratory evaluation that was completed most notable for a creatinine of 1.29, proBNP of 1419 and troponin of 47.  Chest x-ray with evidence of new left pleural effusion.  Patient's overall clinical presentation concerning for volume overload suspected secondary to new heart failure.  Patient will need admission at this time for further volume optimization and cardiac evaluation.  Patient was admitted to the hospitalist service.  Lasix  given here in the emergency department for initial diuresis.   Final diagnoses:  Congestive heart failure, unspecified HF chronicity, unspecified heart failure type (HCC)  Shortness of breath  ED Discharge Orders     None          Laurita Sieving, MD 02/23/24 [id number]    Doretha Folks, MD 02/23/24 1332  "

## 2024-02-22 NOTE — Telephone Encounter (Signed)
 Spoke with the patient's daughter who states that the patient called her complaining of worsening shortness of breath and some dizzy this morning. She states that he reported some swelling as well. She is unsure if he weighs himself. Patient was seen by Dr. Kennyth on 1/20 and did report shortness of breath at that time. The daughter states that it has gotten a little worse since then. Patient has been given lasix  in the past but daughter states that it did not help. Advised on elevation of legs, avoiding salt, checking daily weights, and ER precautions for worsening symptoms.

## 2024-02-22 NOTE — H&P (Incomplete)
 " History and Physical    JOEY LIERMAN FMW:[id number] DOB: 1940/09/11 DOA: 02/22/2024  I have briefly reviewed the patient's prior medical records in [facility]  PCP: Ryan Powell HERO., MD  Patient coming from: home  Chief Complaint: Shortness of breath, dyspnea on exertion  HPI: ADONIS Dylan Roth is a 84 y.o. male with medical history significant of coronary artery disease status post CABG in 2005, obesity, paroxysmal A-fib/flutter, sinus node dysfunction status post PPM, HTN, HLD who comes into the hospital with shortness of breath.  Patient has been feeling more more short winded since Christmas time.  He recently saw cardiology/EP in office about 2 days ago, felt to have increased A-fib burden which may be the culprit for his shortness of breath.  He is scheduled to have an EP study and ablation in February.  Has been placed on amiodarone  as a bridge to ablation.  Today patient reports worsening shortness of breath to the point that he if he goes to pick up his mail has to stop several times on his way back due to shortness of breath.  He denies any chest pain with this.  He denies any nausea or vomiting.  He has not noticed any significant lower extremity swelling but does feel increased abdominal girth distention.  He denies any fever or chills, no abdominal pain.  He reports left shoulder pain due to osteoarthritis  ED Course: In the emergency room he is afebrile, normotensive, satting well on room air.  Blood work reveals a BUN of 27 creatinine of 1.29.  High-sensitivity troponin is 44.  proBNP is 1419.  Flu, RSV, COVID all negative.  Chest x-ray shows a new right-sided pleural effusion with atelectasis, as well as cardiomegaly.  He was given IV Lasix  and we are asked to admit for potential CHF  Review of Systems: All systems reviewed, and apart from HPI, all negative  Past Medical History:  Diagnosis Date   Anemia    Angina    Arthritis    in my back   Atrial  fibrillation/flutter    BPH (benign prostatic hyperplasia)    CAD (coronary artery disease) Dec 2005   Hx MI. 80% left main, 80% LAD, 90% ramus, 50% circ, 90% in nondominant RCA,  EF normal  2010--echo   Cataract    bilateral-removed 6-7 years ago   Central obesity    Chronic back pain    Dysrhythmia    Erectile dysfunction    GERD (gastroesophageal reflux disease)    pt. denies   Gout    once; I have it under control w/medicine   History of anemia    History of colon polyps    Hyperlipidemia    Hypertension    IBS (irritable bowel syndrome)    with diarrhea   Myocardial infarction (HCC)    pt. denies at preop   Pacemaker    Palpitations    Sinus node dysfunction (HCC)    post termination pauses <5sec    Past Surgical History:  Procedure Laterality Date   CARDIAC CATHETERIZATION  2005   that what sent me to CABG   CARDIAC CATHETERIZATION N/A 09/22/2015   Procedure: Left Heart Cath and Cors/Grafts Angiography;  Surgeon: Lonni JONETTA Cash, MD;  Location: [facility] INVASIVE CV LAB;  Service: Cardiovascular;  Laterality: N/A;   CATARACT EXTRACTION W/ INTRAOCULAR LENS  IMPLANT, BILATERAL  ~ 01/2011   CHOLECYSTECTOMY  1978    COLONOSCOPY  2014   (Polypectomy) colonic polyps status post  polypectomy. Pancolonic diverticulosis predominantly in the sigmoid colon. Small internal hemorrhoids. Patient also had perirectal excoriation   CORONARY ARTERY BYPASS GRAFT  2005   in [place]  - LIMA to LAD, SVG to diagonal, SVG to ramus, SVG to OM and SVG to RCA    EYE SURGERY     retina surgey right eye in [place]   PACEMAKER INSERTION  2014   PERMANENT PACEMAKER INSERTION N/A 05/25/2011   Procedure: PERMANENT PACEMAKER INSERTION;  Surgeon: Elspeth JAYSON Sage, MD;  Location: [facility] CATH LAB;  Service: Cardiovascular;  Laterality: N/A;   PPM GENERATOR CHANGEOUT N/A 02/23/2021   Procedure: PPM GENERATOR CHANGEOUT;  Surgeon: Sage Elspeth JAYSON, MD;  Location: [facility] INVASIVE CV LAB;  Service:  Cardiovascular;  Laterality: N/A;   TOTAL KNEE ARTHROPLASTY Right 12/16/2018   Procedure: TOTAL KNEE ARTHROPLASTY;  Surgeon: Rubie Kemps, MD;  Location: [facility] ORS;  Service: Orthopedics;  Laterality: Right;  75 mins needed for length of case   TOTAL KNEE ARTHROPLASTY Left 01/01/2023   Procedure: TOTAL KNEE ARTHROPLASTY;  Surgeon: Rubie Kemps, MD;  Location: [facility] ORS;  Service: Orthopedics;  Laterality: Left;   TRANSURETHRAL RESECTION OF PROSTATE N/A 12/29/2013   Procedure: TRANSURETHRAL RESECTION OF THE PROSTATE WITH GYRUS INSTRUMENTS;  Surgeon: Alm GORMAN Fragmin, MD;  Location: [facility] ORS;  Service: Urology;  Laterality: N/A;     reports that he quit smoking about 63 years ago. His smoking use included cigarettes. He has never used smokeless tobacco. He reports that he does not drink alcohol and does not use drugs.  Allergies[1]  Family History  Problem Relation Age of Onset   Cancer Mother    Stroke Brother    Heart attack Neg Hx    Colon cancer Neg Hx    Colon polyps Neg Hx    Heart disease Neg Hx    Esophageal cancer Neg Hx    Rectal cancer Neg Hx    Stomach cancer Neg Hx     Prior to Admission medications  Medication Sig Start Date End Date Taking? Authorizing Provider  allopurinol  (ZYLOPRIM ) 100 MG tablet Take 100 mg by mouth in the morning.    [provider]  amiodarone  (PACERONE ) 200 MG tablet Take 1 tablet (200 mg total) by mouth daily. 01/11/24 02/19/24  Fenton, Clint R, PA  apixaban  (ELIQUIS ) 5 MG TABS tablet TAKE 1 TABLET TWICE DAILY 11/12/23   Kennyth Chew, MD  atorvastatin  (LIPITOR ) 80 MG tablet Take 80 mg by mouth at bedtime. 02/15/18   [provider]  carvedilol  (COREG ) 3.125 MG tablet TAKE 1 TABLET TWICE DAILY WITH MEALS 11/06/23   Verlin Lonni BIRCH, MD  escitalopram  (LEXAPRO ) 10 MG tablet Take 10 mg by mouth at bedtime. 11/10/19   [provider]  furosemide  (LASIX ) 20 MG tablet Take 1 tablet (20 mg total) by mouth daily. Take for 7 days then  stop 01/14/24 02/19/24  Fenton, Clint R, PA  isosorbide  mononitrate (IMDUR ) 30 MG 24 hr tablet TAKE 1 TABLET TWICE DAILY 06/01/23   Sage Elspeth JAYSON, MD  lisinopril  (ZESTRIL ) 5 MG tablet Take 2.5 mg by mouth daily. 10/18/22   [provider]  MAGNESIUM  PO Take 1 capsule by mouth in the morning.    [provider]  nitroGLYCERIN  (NITROSTAT ) 0.4 MG SL tablet DISSOLVE 1 TABLET UNDER THE TONGUE EVERY 5 MINUTES AS NEEDED FOR CHEST PAIN. DO NOT EXCEED A TOTAL OF 3 DOSES IN 15 MINUTES 10/15/23   Verlin Lonni BIRCH, MD  ranolazine  (RANEXA ) 500 MG 12 hr  tablet TAKE 1 TABLET TWICE DAILY 12/19/23   Verlin Lonni BIRCH, MD  tamsulosin  (FLOMAX ) 0.4 MG CAPS capsule Take 0.4 mg by mouth in the morning.    [provider]    Physical Exam: Vitals:   02/22/24 2028 02/22/24 2037 02/22/24 2224  BP: 131/69  136/74  Pulse: 60  64  Resp: (!) 21  18  Temp: 97.9 F (36.6 C)  97.6 F (36.4 C)  TempSrc: Oral  Oral  SpO2: 100%  100%  Weight:  83.9 kg   Height:  5' 6.5 (1.689 m)     Constitutional: NAD, calm, comfortable Eyes: PERRL, lids and conjunctivae normal ENMT: Mucous membranes are moist. Posterior pharynx clear of any exudate or lesions.Normal dentition.  Neck: normal, supple Respiratory: clear to auscultation bilaterally, no wheezing, diminished at the right lung base Cardiovascular: Regular rate and rhythm, no murmurs / rubs / gallops.  Trace lower extremity edema.  Abdomen: no tenderness, no masses palpated. Bowel sounds positive.  Musculoskeletal: no clubbing / cyanosis. Normal muscle tone.  Skin: no rashes, lesions, ulcers. No induration Neurologic: Nonfocal Labs on Admission: I have personally reviewed following labs and imaging studies  CBC: Recent Labs  Lab 02/22/24 2058  WBC 5.7  NEUTROABS 4.1  HGB 12.5*  HCT 37.1*  MCV 100.0  PLT 168   Basic Metabolic Panel: Recent Labs  Lab 02/22/24 2058  NA 134*  K 5.0  CL 99  CO2 24  GLUCOSE 100*  BUN  27*  CREATININE 1.29*  CALCIUM  8.6*   Liver Function Tests: Recent Labs  Lab 02/22/24 2058  AST 32  ALT 32  ALKPHOS 116  BILITOT 0.6  PROT 6.7  ALBUMIN  3.9   Coagulation Profile: No results for input(s): INR, PROTIME in the last 168 hours. BNP (last 3 results) Recent Labs    02/22/24 2058  PROBNP 1,419.0*   CBG: No results for input(s): GLUCAP in the last 168 hours. Thyroid Function Tests: No results for input(s): TSH, T4TOTAL, FREET4, T3FREE, THYROIDAB in the last 72 hours. Urine analysis:    Component Value Date/Time   COLORURINE YELLOW 02/11/2018 1403   APPEARANCEUR CLEAR 02/11/2018 1403   LABSPEC 1.013 02/11/2018 1403   PHURINE 5.0 02/11/2018 1403   GLUCOSEU NEGATIVE 02/11/2018 1403   HGBUR NEGATIVE 02/11/2018 1403   BILIRUBINUR NEGATIVE 02/11/2018 1403   KETONESUR NEGATIVE 02/11/2018 1403   PROTEINUR NEGATIVE 02/11/2018 1403   UROBILINOGEN >8.0 (H) 01/29/2014 0302   NITRITE NEGATIVE 02/11/2018 1403   LEUKOCYTESUR NEGATIVE 02/11/2018 1403     Radiological Exams on Admission: DG Chest 2 View Result Date: 02/22/2024 EXAM: 2 VIEW(S) XRAY OF THE CHEST 02/22/2024 09:39:40 PM COMPARISON: Prior examination of 04/06/2023. CLINICAL HISTORY: Shortness of breath. FINDINGS: LINES, TUBES AND DEVICES: Left dual lead pacemaker present. LUNGS AND PLEURA: Right pleural effusion with underlying atelectasis versus consolidation. The right pleural effusion is new from prior examination. No pneumothorax. HEART AND MEDIASTINUM: Cardiomegaly. Calcified aorta. CABG changes noted. Left dual lead pacemaker present. BONES AND SOFT TISSUES: Thoracic degenerative changes. Median sternotomy wires present. No acute osseous abnormality. IMPRESSION: 1. New right pleural effusion with underlying atelectasis versus consolidation. 2. Cardiomegaly Electronically signed by: Dorethia Molt MD 02/22/2024 09:43 PM EST RP Workstation: [id number]    EKG: Independently reviewed.  Paced  rhythm  Assessment/Plan Principal problem Acute on chronic diastolic CHF -most recent 2D echo in our system was in November 2024 showed LVEF 55-60%, grade 1 diastolic dysfunction.  RV systolic function was mildly reduced with normal pulmonary  artery systolic pressure. - He has evidence of fluid overload now with pleural effusion, elevated BNP, received 40 IV Lasix  in the ED, monitor response and morning labs, redose Lasix  in the morning if appropriate  Active problems Persistent A-fib/flutter -with significantly increased burden  DVT prophylaxis: ***  Code Status: ***  Family Communication: *** Bed Type: *** Consults called: ***  Obs/Inp: ***  At the time of admission, it appears that the appropriate admission status for this patient is INPATIENT as it is expected that patient will require hospital care > 2 midnights. This is judged to be reasonable and necessary in order to provide the required intensity of service to ensure the patient's safety given: presenting symptoms, initial radiographic and laboratory data and in the context of their chronic comorbidities. Together, these circumstances are felt to place patient at high at high risk for further clinical deterioration threatening life, limb, or organ.  Nilda Fendt, MD, PhD [facility]  Contact via [email or web address]  02/22/2024, 11:35 PM         [1]  Allergies Allergen Reactions   Sulfa Antibiotics Other (See Comments)    SWELLING ON PATIENTS LIPS, HE GETS DRIED UP   Sulfamethoxazole Other (See Comments)    Unknown   Sulfamethoxazole-Trimethoprim Other (See Comments)    Unknown   "

## 2024-02-23 ENCOUNTER — Observation Stay (HOSPITAL_COMMUNITY)

## 2024-02-23 DIAGNOSIS — E785 Hyperlipidemia, unspecified: Secondary | ICD-10-CM | POA: Diagnosis present

## 2024-02-23 DIAGNOSIS — I2510 Atherosclerotic heart disease of native coronary artery without angina pectoris: Secondary | ICD-10-CM | POA: Diagnosis present

## 2024-02-23 DIAGNOSIS — I509 Heart failure, unspecified: Secondary | ICD-10-CM | POA: Insufficient documentation

## 2024-02-23 DIAGNOSIS — I447 Left bundle-branch block, unspecified: Secondary | ICD-10-CM | POA: Diagnosis present

## 2024-02-23 DIAGNOSIS — I480 Paroxysmal atrial fibrillation: Secondary | ICD-10-CM

## 2024-02-23 DIAGNOSIS — I130 Hypertensive heart and chronic kidney disease with heart failure and stage 1 through stage 4 chronic kidney disease, or unspecified chronic kidney disease: Secondary | ICD-10-CM | POA: Diagnosis present

## 2024-02-23 DIAGNOSIS — Z950 Presence of cardiac pacemaker: Secondary | ICD-10-CM | POA: Diagnosis not present

## 2024-02-23 DIAGNOSIS — Z7901 Long term (current) use of anticoagulants: Secondary | ICD-10-CM | POA: Diagnosis not present

## 2024-02-23 DIAGNOSIS — I10 Essential (primary) hypertension: Secondary | ICD-10-CM

## 2024-02-23 DIAGNOSIS — I5031 Acute diastolic (congestive) heart failure: Secondary | ICD-10-CM

## 2024-02-23 DIAGNOSIS — N400 Enlarged prostate without lower urinary tract symptoms: Secondary | ICD-10-CM | POA: Diagnosis present

## 2024-02-23 DIAGNOSIS — I340 Nonrheumatic mitral (valve) insufficiency: Secondary | ICD-10-CM | POA: Diagnosis present

## 2024-02-23 DIAGNOSIS — I4819 Other persistent atrial fibrillation: Secondary | ICD-10-CM | POA: Diagnosis present

## 2024-02-23 DIAGNOSIS — E871 Hypo-osmolality and hyponatremia: Secondary | ICD-10-CM | POA: Diagnosis not present

## 2024-02-23 DIAGNOSIS — I5033 Acute on chronic diastolic (congestive) heart failure: Secondary | ICD-10-CM | POA: Diagnosis present

## 2024-02-23 DIAGNOSIS — I4892 Unspecified atrial flutter: Secondary | ICD-10-CM | POA: Diagnosis present

## 2024-02-23 DIAGNOSIS — Z87891 Personal history of nicotine dependence: Secondary | ICD-10-CM | POA: Diagnosis not present

## 2024-02-23 DIAGNOSIS — R0602 Shortness of breath: Secondary | ICD-10-CM | POA: Diagnosis present

## 2024-02-23 DIAGNOSIS — N179 Acute kidney failure, unspecified: Secondary | ICD-10-CM | POA: Diagnosis present

## 2024-02-23 DIAGNOSIS — Z951 Presence of aortocoronary bypass graft: Secondary | ICD-10-CM | POA: Diagnosis not present

## 2024-02-23 DIAGNOSIS — I495 Sick sinus syndrome: Secondary | ICD-10-CM | POA: Diagnosis present

## 2024-02-23 DIAGNOSIS — N1831 Chronic kidney disease, stage 3a: Secondary | ICD-10-CM | POA: Diagnosis present

## 2024-02-23 DIAGNOSIS — F32A Depression, unspecified: Secondary | ICD-10-CM | POA: Diagnosis present

## 2024-02-23 DIAGNOSIS — Z96653 Presence of artificial knee joint, bilateral: Secondary | ICD-10-CM | POA: Diagnosis present

## 2024-02-23 DIAGNOSIS — N401 Enlarged prostate with lower urinary tract symptoms: Secondary | ICD-10-CM | POA: Diagnosis not present

## 2024-02-23 DIAGNOSIS — M109 Gout, unspecified: Secondary | ICD-10-CM | POA: Diagnosis present

## 2024-02-23 DIAGNOSIS — Z79899 Other long term (current) drug therapy: Secondary | ICD-10-CM | POA: Diagnosis not present

## 2024-02-23 DIAGNOSIS — Z1152 Encounter for screening for COVID-19: Secondary | ICD-10-CM | POA: Diagnosis not present

## 2024-02-23 DIAGNOSIS — I252 Old myocardial infarction: Secondary | ICD-10-CM | POA: Diagnosis not present

## 2024-02-23 DIAGNOSIS — Z882 Allergy status to sulfonamides status: Secondary | ICD-10-CM | POA: Diagnosis not present

## 2024-02-23 LAB — ECHOCARDIOGRAM COMPLETE
Area-P 1/2: 4.24 cm2
Calc EF: 47.9 %
Height: 66.5 in
MV M vel: 4.43 m/s
MV Peak grad: 78.5 mmHg
P 1/2 time: 594 ms
S' Lateral: 3 cm
Single Plane A2C EF: 48.5 %
Single Plane A4C EF: 49.5 %
Weight: 2960 [oz_av]

## 2024-02-23 LAB — TROPONIN T, HIGH SENSITIVITY: Troponin T High Sensitivity: 47 ng/L — ABNORMAL HIGH (ref 0–19)

## 2024-02-23 MED ORDER — CARVEDILOL 3.125 MG PO TABS
3.1250 mg | ORAL_TABLET | Freq: Two times a day (BID) | ORAL | Status: DC
  Administered 2024-02-23 – 2024-02-26 (×6): 3.125 mg via ORAL
  Filled 2024-02-23 (×6): qty 1

## 2024-02-23 MED ORDER — LISINOPRIL 5 MG PO TABS
2.5000 mg | ORAL_TABLET | Freq: Every day | ORAL | Status: DC
  Administered 2024-02-23 – 2024-02-26 (×3): 2.5 mg via ORAL
  Filled 2024-02-23 (×5): qty 1

## 2024-02-23 MED ORDER — EMPAGLIFLOZIN 10 MG PO TABS
10.0000 mg | ORAL_TABLET | Freq: Every day | ORAL | Status: DC
  Administered 2024-02-23 – 2024-02-26 (×4): 10 mg via ORAL
  Filled 2024-02-23 (×4): qty 1

## 2024-02-23 MED ORDER — ATORVASTATIN CALCIUM 80 MG PO TABS
80.0000 mg | ORAL_TABLET | Freq: Every day | ORAL | Status: DC
  Administered 2024-02-23 – 2024-02-25 (×3): 80 mg via ORAL
  Filled 2024-02-23 (×3): qty 1

## 2024-02-23 MED ORDER — ACETAMINOPHEN 650 MG RE SUPP: 650.0000 mg | Freq: Four times a day (QID) | RECTAL | Status: DC | PRN

## 2024-02-23 MED ORDER — ONDANSETRON HCL 4 MG PO TABS: 4.0000 mg | ORAL_TABLET | Freq: Four times a day (QID) | ORAL | Status: DC | PRN

## 2024-02-23 MED ORDER — AMIODARONE HCL 200 MG PO TABS
200.0000 mg | ORAL_TABLET | Freq: Every day | ORAL | Status: DC
  Administered 2024-02-23 – 2024-02-26 (×4): 200 mg via ORAL
  Filled 2024-02-23 (×4): qty 1

## 2024-02-23 MED ORDER — APIXABAN 5 MG PO TABS
5.0000 mg | ORAL_TABLET | Freq: Two times a day (BID) | ORAL | Status: DC
  Administered 2024-02-23 – 2024-02-25 (×6): 5 mg via ORAL
  Filled 2024-02-23 (×7): qty 1

## 2024-02-23 MED ORDER — FUROSEMIDE 10 MG/ML IJ SOLN
40.0000 mg | Freq: Two times a day (BID) | INTRAMUSCULAR | Status: DC
  Administered 2024-02-23 (×2): 40 mg via INTRAVENOUS
  Filled 2024-02-23 (×2): qty 4

## 2024-02-23 MED ORDER — PHENOL 1.4 % MT LIQD
1.0000 | OROMUCOSAL | Status: DC | PRN
  Filled 2024-02-23: qty 177

## 2024-02-23 MED ORDER — ESCITALOPRAM OXALATE 10 MG PO TABS
10.0000 mg | ORAL_TABLET | Freq: Every day | ORAL | Status: DC
  Administered 2024-02-23 – 2024-02-25 (×3): 10 mg via ORAL
  Filled 2024-02-23 (×3): qty 1

## 2024-02-23 MED ORDER — RANOLAZINE ER 500 MG PO TB12
500.0000 mg | ORAL_TABLET | Freq: Two times a day (BID) | ORAL | Status: DC
  Administered 2024-02-23 – 2024-02-26 (×7): 500 mg via ORAL
  Filled 2024-02-23 (×7): qty 1

## 2024-02-23 MED ORDER — TAMSULOSIN HCL 0.4 MG PO CAPS
0.4000 mg | ORAL_CAPSULE | Freq: Every morning | ORAL | Status: DC
  Administered 2024-02-23 – 2024-02-26 (×4): 0.4 mg via ORAL
  Filled 2024-02-23 (×4): qty 1

## 2024-02-23 MED ORDER — ACETAMINOPHEN 325 MG PO TABS: 650.0000 mg | ORAL_TABLET | Freq: Four times a day (QID) | ORAL | Status: DC | PRN

## 2024-02-23 MED ORDER — ONDANSETRON HCL 4 MG/2ML IJ SOLN: 4.0000 mg | Freq: Four times a day (QID) | INTRAMUSCULAR | Status: DC | PRN

## 2024-02-23 MED ORDER — ISOSORBIDE MONONITRATE ER 30 MG PO TB24
30.0000 mg | ORAL_TABLET | Freq: Two times a day (BID) | ORAL | Status: DC
  Administered 2024-02-23 – 2024-02-26 (×6): 30 mg via ORAL
  Filled 2024-02-23 (×7): qty 1

## 2024-02-23 NOTE — Assessment & Plan Note (Signed)
 Continue with statin therapy

## 2024-02-23 NOTE — Progress Notes (Signed)
" °  Echocardiogram 2D Echocardiogram has been performed.  Dylan Roth 02/23/2024, 12:03 PM "

## 2024-02-23 NOTE — Progress Notes (Signed)
 Remote PPM Transmission

## 2024-02-23 NOTE — Assessment & Plan Note (Addendum)
 Sp CABG No chest pain, elevation in high sensitive troponin due to heart failure, no acute coronary syndrome  Continue blood pressure control  Continue atorvastatin , isosorbide  and ranolazine 

## 2024-02-23 NOTE — Assessment & Plan Note (Signed)
 Hyponatremia   Renal function with serum cr at 1,62 with K at 4,3 and serum bicarbonate at 28  Na 135 and Mg 1,7   Hold on loop diuretic today Add 2 g Mg sulfate to avoid hypomagnesemia Follow up renal function and electrolytes in am.

## 2024-02-23 NOTE — Assessment & Plan Note (Signed)
 Continue blood pressure control with carvedilol  and lisinopril  Follow up on echocardiogram  Continue diuresis

## 2024-02-23 NOTE — Assessment & Plan Note (Addendum)
 Last echocardiogram from 2024 with preserved LV systolic function   Improved volume status but not yet back to baseline  Plan to continue furosemide  40 mg IV bid Continue lisinopril  and carvedilol  Add SGLT 2 inh to augment diuresis  Follow up on echocardiogram.   Acute cardiogenic pulmonary edema with bilateral pleural effusions Continue diuresis  Oxymetry monitoring and supplemental 02 per [place] to keep 02 saturation 92% or grater.

## 2024-02-23 NOTE — Assessment & Plan Note (Addendum)
 Currently paced rhythm atrial and ventricular.  Pacemaker related left bundle branch block Follow up on echocardiogram Continue carvedilol  for rate control and anticoagulation with apixaban   Continue amiodarone 

## 2024-02-23 NOTE — Progress Notes (Addendum)
 " Progress Note   Patient: Dylan Roth FMW:[id number] DOB: 08-31-1940 DOA: 02/22/2024     0 DOS: the patient was seen and examined on 02/23/2024   Brief hospital course: Mr. Mears was admitted to the hospital with the working diagnosis of heart failure exacerbation  84 yo male with the past medical history of coronary artery disease, paroxysmal atrial fibrillation, hypertension, dyslipidemia, and sinus node dysfunction sp pacemaker implantation who presented with dyspnea. 01/20 Patient was found to have increased in atrial fibrillation burden as outpatient, he was placed on amiodarone  and schedule for atrial fibrillation ablation in February.  Despite antiarrhythmic therapy patient continue to have worsening dyspnea on exertion, to the point where he was having symptoms with minimal efforts.  No lower extremity edema but increased abdominal girth.  Positive 6 lbs weight gain over the last month  On his initial physical examination his blood pressure was 131/69, HR 60 RR 21 and 02 saturation 100%  Lungs with no wheezing or rales, decreased breath sounds at the right base, heart with S1 and S2 present and regular with no gallops, rubs or murmurs, abdomen with no distention and positive trace lower extremity edema.   Na 134, K 5.0 Cl 99 bicarbonate 24 glucose 100 bun 27 cr 1,29  AST 32 ALT 32  Pro BNP 1,419 High sensitive troponin 44 Wbc 5.7 hgb 121.5 plt 168  Influenza negative RSV negative SARS covid 19 negative   Chest radiograph left rotation, with hypoinflation, positive cardiomegaly, bilateral hilar vascular congestion, central interstitial infiltrates, bilateral pleural effusions more right than left.   EKG 61 bpm, left axis deviation, left bundle branch block, qtc 432, atrial and ventricular pacing 100%, with no significant ST segment or T wave changes.   Patient was placed on furosemide  for diuresis      Assessment and Plan: * Acute on chronic diastolic CHF (congestive  heart failure) (HCC) Last echocardiogram from 2024 with preserved LV systolic function   Improved volume status but not yet back to baseline  Plan to continue furosemide  40 mg IV bid Continue lisinopril  and carvedilol  Add SGLT 2 inh to augment diuresis  Follow up on echocardiogram.   Acute cardiogenic pulmonary edema with bilateral pleural effusions Continue diuresis  Oxymetry monitoring and supplemental 02 per [place] to keep 02 saturation 92% or grater.   CAD (coronary artery disease) Sp CABG No chest pain, elevation in high sensitive troponin due to heart failure, no acute coronary syndrome  Continue blood pressure control  Continue atorvastatin , isosorbide  and ranolazine   HTN (hypertension) Continue blood pressure control with carvedilol  and lisinopril  Follow up on echocardiogram  Continue diuresis   Atrial fibrillation/flutter Currently paced rhythm atrial and ventricular.  Pacemaker related left bundle branch block Follow up on echocardiogram Continue carvedilol  for rate control and anticoagulation with apixaban   Continue amiodarone    AKI (acute kidney injury) Hyponatremia   Renal function today with serum cr at 1.29 with K at 5.0 and serum bicarbonate at 24  Na 134   Plan to continue diuresis  Hold on mineralocorticoid receptor blocker due to risk of hyperkalemia Follow up renal function and electrolytes in am   Dyslipidemia Continue with statin therapy      Subjective: Patient feeling better but not yet back to baseline, continue to feel abdominal tightness   Physical Exam: Vitals:   02/23/24 0153 02/23/24 0245 02/23/24 0600 02/23/24 0614  BP:  112/70 (!) 156/97   Pulse:  (!) 59 70   Resp:  19 (!) 25  Temp: 98.4 F (36.9 C)   97.8 F (36.6 C)  TempSrc: Oral   Oral  SpO2:  96% 98%   Weight:      Height:       Neurology awake and alert ENT with mild pallor Cardiovascular with S1 and S2 present and regular with no gallops or rubs, no murmurs Mild  JVD Respiratory with rales bilaterally with decreased breath sounds at the right base with no wheezing or rhonchi  Abdomen with no distention, soft and non tender No lower extremity edema  Data Reviewed:    Family Communication: no family at the bedside   Disposition: Status is: Observation The patient will require care spanning > 2 midnights and should be moved to inpatient because: IV diuresis   Planned Discharge Destination: Home     Author: Elidia Toribio Furnace, MD 02/23/2024 9:03 AM  For on call review [email or web address].  "

## 2024-02-23 NOTE — Hospital Course (Addendum)
 Dylan Roth was admitted to the hospital with the working diagnosis of heart failure exacerbation  84 yo male with the past medical history of coronary artery disease, paroxysmal atrial fibrillation, hypertension, dyslipidemia, and sinus node dysfunction sp pacemaker implantation who presented with dyspnea. 01/20 Patient was found to have increased in atrial fibrillation burden as outpatient, he was placed on amiodarone  and schedule for atrial fibrillation ablation in February.  Despite antiarrhythmic therapy patient continue to have worsening dyspnea on exertion, to the point where he was having symptoms with minimal efforts.  No lower extremity edema but increased abdominal girth.  Positive 6 lbs weight gain over the last month  On his initial physical examination his blood pressure was 131/69, HR 60 RR 21 and 02 saturation 100%  Lungs with no wheezing or rales, decreased breath sounds at the right base, heart with S1 and S2 present and regular with no gallops, rubs or murmurs, abdomen with no distention and positive trace lower extremity edema.   Na 134, K 5.0 Cl 99 bicarbonate 24 glucose 100 bun 27 cr 1,29  AST 32 ALT 32  Pro BNP 1,419 High sensitive troponin 44 Wbc 5.7 hgb 121.5 plt 168  Influenza negative RSV negative SARS covid 19 negative   Chest radiograph left rotation, with hypoinflation, positive cardiomegaly, bilateral hilar vascular congestion, central interstitial infiltrates, bilateral pleural effusions more right than left.   EKG 61 bpm, left axis deviation, left bundle branch block, qtc 432, atrial and ventricular pacing 100%, with no significant ST segment or T wave changes.   Patient was placed on furosemide  for diuresis   01/25 echocardiogram with preserved LV systolic function with grade II diastolic dysfunction. Improved volume status, holding IV furosemide   01/26 improved volume status, pending renal function to be more stable prior to his discharge.  01/27 renal  function improved, plan for discharge home today and follow up as outpatient.

## 2024-02-24 DIAGNOSIS — N401 Enlarged prostate with lower urinary tract symptoms: Secondary | ICD-10-CM | POA: Diagnosis not present

## 2024-02-24 DIAGNOSIS — N400 Enlarged prostate without lower urinary tract symptoms: Secondary | ICD-10-CM

## 2024-02-24 DIAGNOSIS — I10 Essential (primary) hypertension: Secondary | ICD-10-CM | POA: Diagnosis not present

## 2024-02-24 DIAGNOSIS — M109 Gout, unspecified: Secondary | ICD-10-CM | POA: Diagnosis not present

## 2024-02-24 DIAGNOSIS — E785 Hyperlipidemia, unspecified: Secondary | ICD-10-CM

## 2024-02-24 DIAGNOSIS — N179 Acute kidney failure, unspecified: Secondary | ICD-10-CM | POA: Diagnosis not present

## 2024-02-24 DIAGNOSIS — I480 Paroxysmal atrial fibrillation: Secondary | ICD-10-CM | POA: Diagnosis not present

## 2024-02-24 DIAGNOSIS — I5033 Acute on chronic diastolic (congestive) heart failure: Secondary | ICD-10-CM | POA: Diagnosis not present

## 2024-02-24 DIAGNOSIS — I2510 Atherosclerotic heart disease of native coronary artery without angina pectoris: Secondary | ICD-10-CM | POA: Diagnosis not present

## 2024-02-24 LAB — COMPREHENSIVE METABOLIC PANEL WITH GFR
ALT: 30 U/L (ref 0–44)
AST: 31 U/L (ref 15–41)
Albumin: 3.9 g/dL (ref 3.5–5.0)
Alkaline Phosphatase: 104 U/L (ref 38–126)
Anion gap: 12 (ref 5–15)
BUN: 30 mg/dL — ABNORMAL HIGH (ref 8–23)
CO2: 28 mmol/L (ref 22–32)
Calcium: 9.1 mg/dL (ref 8.9–10.3)
Chloride: 95 mmol/L — ABNORMAL LOW (ref 98–111)
Creatinine, Ser: 1.62 mg/dL — ABNORMAL HIGH (ref 0.61–1.24)
GFR, Estimated: 42 mL/min — ABNORMAL LOW
Glucose, Bld: 78 mg/dL (ref 70–99)
Potassium: 4.3 mmol/L (ref 3.5–5.1)
Sodium: 135 mmol/L (ref 135–145)
Total Bilirubin: 1.2 mg/dL (ref 0.0–1.2)
Total Protein: 6.7 g/dL (ref 6.5–8.1)

## 2024-02-24 LAB — MAGNESIUM: Magnesium: 1.7 mg/dL (ref 1.7–2.4)

## 2024-02-24 MED ORDER — DICLOFENAC SODIUM 1 % EX GEL
2.0000 g | Freq: Four times a day (QID) | CUTANEOUS | Status: DC | PRN
  Administered 2024-02-24 – 2024-02-26 (×3): 2 g via TOPICAL
  Filled 2024-02-24: qty 100

## 2024-02-24 MED ORDER — ALLOPURINOL 100 MG PO TABS
100.0000 mg | ORAL_TABLET | Freq: Every morning | ORAL | Status: DC
  Administered 2024-02-24 – 2024-02-26 (×3): 100 mg via ORAL
  Filled 2024-02-24 (×3): qty 1

## 2024-02-24 MED ORDER — MAGNESIUM SULFATE 2 GM/50ML IV SOLN
2.0000 g | Freq: Once | INTRAVENOUS | Status: AC
  Administered 2024-02-24: 2 g via INTRAVENOUS
  Filled 2024-02-24: qty 50

## 2024-02-24 MED ORDER — COLCHICINE 0.6 MG PO TABS
0.6000 mg | ORAL_TABLET | Freq: Once | ORAL | Status: AC
  Administered 2024-02-24: 0.6 mg via ORAL
  Filled 2024-02-24: qty 1

## 2024-02-24 NOTE — Progress Notes (Addendum)
 " Progress Note   Patient: Dylan Roth FMW:[id number] DOB: 1940-03-18 DOA: 02/22/2024     1 DOS: the patient was seen and examined on 02/24/2024   Brief hospital course: Dylan Roth was admitted to the hospital with the working diagnosis of heart failure exacerbation  84 yo male with the past medical history of coronary artery disease, paroxysmal atrial fibrillation, hypertension, dyslipidemia, and sinus node dysfunction sp pacemaker implantation who presented with dyspnea. 01/20 Patient was found to have increased in atrial fibrillation burden as outpatient, he was placed on amiodarone  and schedule for atrial fibrillation ablation in February.  Despite antiarrhythmic therapy patient continue to have worsening dyspnea on exertion, to the point where he was having symptoms with minimal efforts.  No lower extremity edema but increased abdominal girth.  Positive 6 lbs weight gain over the last month  On his initial physical examination his blood pressure was 131/69, HR 60 RR 21 and 02 saturation 100%  Lungs with no wheezing or rales, decreased breath sounds at the right base, heart with S1 and S2 present and regular with no gallops, rubs or murmurs, abdomen with no distention and positive trace lower extremity edema.   Na 134, K 5.0 Cl 99 bicarbonate 24 glucose 100 bun 27 cr 1,29  AST 32 ALT 32  Pro BNP 1,419 High sensitive troponin 44 Wbc 5.7 hgb 121.5 plt 168  Influenza negative RSV negative SARS covid 19 negative   Chest radiograph left rotation, with hypoinflation, positive cardiomegaly, bilateral hilar vascular congestion, central interstitial infiltrates, bilateral pleural effusions more right than left.   EKG 61 bpm, left axis deviation, left bundle branch block, qtc 432, atrial and ventricular pacing 100%, with no significant ST segment or T wave changes.   Patient was placed on furosemide  for diuresis   01/25 echocardiogram with preserved LV systolic function with grade II  diastolic dysfunction. Improved volume status, holding IV furosemide    Assessment and Plan: * Acute on chronic diastolic CHF (congestive heart failure) (HCC) Echocardiogram with preserved LV systolic function, EF 50 to 55%, no regional wall motion abnormalities, grade II diastolic dysfunction, with pseudo normalization, RVSP 26.4 mmHg, RV systolic function mildly reduced, severe dilatation LA and RA, trivial mitral valve regurgitation, no pericardial effusion   Urine output 1,500 ml Since admission -3,030 ml Systolic blood pressure 90 to 899   Continue lisinopril  and carvedilol  Continue with SGLT 2 inh to augment diuresis   Acute cardiogenic pulmonary edema with bilateral pleural effusions  Oxymetry monitoring and supplemental 02 per Dylan Roth to keep 02 saturation 92% or grater.  02 saturation today 94% on room air   CAD (coronary artery disease) Sp CABG No chest pain, elevation in high sensitive troponin due to heart failure, no acute coronary syndrome  Continue blood pressure control  Continue atorvastatin , isosorbide  and ranolazine   HTN (hypertension) Continue blood pressure control with carvedilol  and lisinopril   Atrial fibrillation/flutter Currently paced rhythm atrial and ventricular.  Pacemaker related left bundle branch block  Continue carvedilol  for rate control and anticoagulation with apixaban   Continue amiodarone    AKI (acute kidney injury) Hyponatremia   Renal function with serum cr at 1,62 with K at 4,3 and serum bicarbonate at 28  Na 135 and Mg 1,7   Hold on loop diuretic today Add 2 g Mg sulfate to avoid hypomagnesemia Follow up renal function and electrolytes in am.   Dyslipidemia Continue with statin therapy   BPH (benign prostatic hyperplasia) Continue with tamsulosin    Gout Resume allopurinol .  Today  having right foot pain, possible start of acute gout attack, will give one dose of colchicine  0,6       Subjective: Patient is feeling better, no  chest pain and no dyspnea, no abdominal tightness, he is feeling lightheaded.   Physical Exam: Vitals:   02/24/24 0300 02/24/24 0730 02/24/24 0939 02/24/24 0946  BP: (!) 104/58 (!) 88/58 (!) 89/57 (!) 104/44  Pulse: 70 69 70 76  Resp: 18 17    Temp: 98.2 F (36.8 C) 98.5 F (36.9 C)    TempSrc: Oral Oral    SpO2: (!) 87% 94%    Weight:      Height:       Neurology awake and alert ENT with no pallor or icterus Cardiovascular with S1 and S2 present and regular with no gallops, or rubs, no murmurs No JVD Respiratory with no rales or wheezing, no rhonchi  Abdomen with no distention, soft and non tender No lower extremity edema   Data Reviewed:    Family Communication: no family at the bedside   Disposition: Status is: Inpatient Remains inpatient appropriate because: recovering heart failure   Planned Discharge Destination: Home     Author: Elidia Toribio Furnace, MD 02/24/2024 10:20 AM  For on call review [email or web address].  "

## 2024-02-24 NOTE — Assessment & Plan Note (Signed)
 Resume allopurinol .  Today having right foot pain, possible start of acute gout attack, will give one dose of colchicine  0,6

## 2024-02-24 NOTE — TOC Initial Note (Signed)
 Transition of Care [facility]) - Initial/Assessment Note    Patient Details  Name: Dylan Roth MRN: [id number] Date of Birth: [birth year]  Transition of Care [facility]) CM/SW Contact:    Inocente GORMAN Kindle, LCSW Phone Number: 02/24/2024, 1:58 PM  Clinical Narrative:                 CSW received consult for possible SNF at time of discharge. CSW met with patient. Patient reported that he does not think SNF rehab is needed and would like home health services instead as he has had that before. He cannot remember the agency he has used in the past but is fine with whichever agency has openings. He reported that he lives alone and has a neighbor that helps. His kids live in [place] and [place] but will come by occasionally. CSW discussed equipment needs and patient sometimes uses a cane but he has a walker that he can use. CSW confirmed PCP and address. Will update RNCM to arrange HH.    Expected Discharge Plan: Home w Home Health Services Barriers to Discharge: Continued Medical Work up   Patient Goals and CMS Choice Patient states their goals for this hospitalization and ongoing recovery are:: Return home CMS Medicare.gov Compare Post Acute Care list provided to:: Patient Choice offered to / list presented to : Patient [place] ownership interest in [facility]provided to:: Patient    Expected Discharge Plan and Services In-house Referral: Clinical Social Work Discharge Planning Services: CM Consult Post Acute Care Choice: Home Health Living arrangements for the past 2 months: Single Family Home                                      Prior Living Arrangements/Services Living arrangements for the past 2 months: Single Family Home Lives with:: Self Patient language and need for interpreter reviewed:: Yes Do you feel safe going back to the place where you live?: Yes      Need for Family Participation in Patient Care: Yes (Comment) Care giver support system in place?:  Yes (comment) Current home services: DME (cane, walker) Criminal Activity/Legal Involvement Pertinent to Current Situation/Hospitalization: No - Comment as needed  Activities of Daily Living      Permission Sought/Granted Permission sought to share information with : Facility Industrial/product Designer granted to share information with : Yes, Verbal Permission Granted     Permission granted to share info w AGENCY: HH        Emotional Assessment Appearance:: Appears stated age Attitude/Demeanor/Rapport: Engaged, Gracious Affect (typically observed): Accepting, Pleasant Orientation: : Oriented to Self, Oriented to Place, Oriented to  Time, Oriented to Situation Alcohol / Substance Use: Not Applicable Psych Involvement: No (comment)  Admission diagnosis:  Shortness of breath [R06.02] Paroxysmal atrial fibrillation (HCC) [I48.0] CHF exacerbation (HCC) [I50.9] Congestive heart failure, unspecified HF chronicity, unspecified heart failure type (HCC) [I50.9] Patient Active Problem List   Diagnosis Date Noted   BPH (benign prostatic hyperplasia) 02/24/2024   AKI (acute kidney injury) 02/23/2024   Dyslipidemia 02/23/2024   CHF exacerbation (HCC) 02/23/2024   Acute on chronic diastolic CHF (congestive heart failure) (HCC) 02/22/2024   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   Preop cardiovascular exam 03/08/2018   HTN (hypertension) 02/10/2018   Gout 02/10/2018   CAD (coronary artery disease) 02/10/2018   Hx of CABG 02/10/2018   Hyperkalemia 02/10/2018   Chest pain 02/10/2018   Encounter  for laboratory examination 09/24/2015   Hematuria, gross 01/29/2014   BPH with urinary obstruction 12/29/2013   Pacemaker-St.Jude 06/05/2011   Anticoagulant long-term use 05/23/2011   Atrial fibrillation/flutter    SSS (sick sinus syndrome) (HCC)    HYPERCHOLESTEROLEMIA 10/16/2008   Essential hypertension, benign 10/16/2008   Coronary artery disease involving native coronary artery of  native heart with unstable angina pectoris (HCC) 10/16/2008   PCP:  Ryan Powell HERO., MD Pharmacy:   [facility] - [place], [place] - [address] [place] [address] Phone: [phone] Fax: [phone]  [facility] [phone] - [address] [place] - [address] [place] [address] Phone: [phone] Fax: [phone]  [id number] - [place], [place] - [address] AT [facility] [address] [place] [address] Phone: [phone] Fax: [phone]     Social Drivers of Health (SDOH) Social History: SDOH Screenings   Food Insecurity: No Food Insecurity (02/24/2024)  Housing: Low Risk (02/24/2024)  Transportation Needs: No Transportation Needs (02/24/2024)  Utilities: Not At Risk (02/24/2024)  Physical Activity: Unknown (07/03/2022)   Received from [facility]  Social Connections: Unknown (02/24/2024)  Tobacco Use: Medium Risk (02/22/2024)   SDOH Interventions:     Readmission Risk Interventions     No data to display

## 2024-02-24 NOTE — Evaluation (Addendum)
 Physical Therapy Evaluation Patient Details Name: Dylan Roth MRN: [id number] DOB: [birth year] Today's Date: 02/24/2024  History of Present Illness  84 y.o. male presents to [facility] 02/22/24 with SOB and increased a-fib burden. Admitted with acute on chronic CHF. Chest x-ray showed R sided pleural effusion w/ atelectasis and cardiomegaly. PMHx: coronary artery disease status post CABG in 2005, obesity, paroxysmal A-fib/flutter, sinus node dysfunction status post PPM, HTN, HLD   Clinical Impression  PTA pt was ModI for mobility with use of hurrycane. Pt reported 3-4 recent falls due to tripping. Pt presents below baseline with R foot pain and impaired balance. Pt required MinA for bed mobility and MinA for seated balance due to intermittent posterior lean with dynamic activities. Able to stand with RW with ModA to boost-up and steady. Able to take a few steps forwards/backwards with MinA and RW. Antalgic step to pattern with pt attempting to off-load RLE 2/2 pain. Pt has intermittent assist available upon d/c home. At this time, recommending <3hrs post acute rehab to work towards prior level of independence. Pt is hopeful for return home if he is able to be ModI for all mobility and ADLs. Acute PT to follow.    HR 110s-148 BPM with mobility    If plan is discharge home, recommend the following: A lot of help with walking and/or transfers;A lot of help with bathing/dressing/bathroom;Assist for transportation;Help with stairs or ramp for entrance;Assistance with cooking/housework   Can travel by private vehicle   Yes    Equipment Recommendations Rolling walker (2 wheels)     Functional Status Assessment Patient has had a recent decline in their functional status and demonstrates the ability to make significant improvements in function in a reasonable and predictable amount of time.     Precautions / Restrictions Precautions Precautions: Fall Recall of Precautions/Restrictions:  Intact Precaution/Restrictions Comments: watch HR Restrictions Weight Bearing Restrictions Per Provider Order: No      Mobility  Bed Mobility Overal bed mobility: Needs Assistance Bed Mobility: Supine to Sit, Sit to Supine    Supine to sit: Min assist, HOB elevated Sit to supine: Min assist, HOB elevated   General bed mobility comments: MinA to steady trunk after trunk raise. Slight MinA to adjust trunk while in supine    Transfers Overall transfer level: Needs assistance Equipment used: Rolling walker (2 wheels) Transfers: Sit to/from Stand Sit to Stand: Mod assist    General transfer comment: ModA for boost-up and steadying assist. Forward flexed posture with increased time and effort to put hands on RW handles. Leaning to the L to off-load R LE    Ambulation/Gait Ambulation/Gait assistance: Min assist Gait Distance (Feet): 4 Feet Assistive device: Rolling walker (2 wheels) Gait Pattern/deviations: Step-to pattern, Decreased stance time - left, Decreased step length - right, Decreased weight shift to right, Antalgic Gait velocity: decr    General Gait Details: pt attempting to off-load R LE by leaning towards the left. Able to weight-bear on R LE, however, limited with antalgic step to pattern. Only able to take a few steps forwards/backwards    Balance Overall balance assessment: Needs assistance, Mild deficits observed, not formally tested, History of Falls Sitting-balance support: Feet supported, Bilateral upper extremity supported Sitting balance-Leahy Scale: Poor Sitting balance - Comments: MinA for posterior lean with dynamic activities Postural control: Posterior lean Standing balance support: Bilateral upper extremity supported, During functional activity, Reliant on assistive device for balance Standing balance-Leahy Scale: Poor Standing balance comment: reliant on UE and external support  Pertinent Vitals/Pain Pain Assessment Pain Assessment:  Faces Faces Pain Scale: Hurts whole lot Pain Location: R foot Pain Descriptors / Indicators: Aching, Discomfort Pain Intervention(s): Limited activity within patient's tolerance, Monitored during session, Repositioned    Home Living Family/patient expects to be discharged to:: Private residence Living Arrangements: Alone Available Help at Discharge: Friend(s);Available PRN/intermittently Type of Home: House Home Access: Stairs to enter Entrance Stairs-Rails: Right;Left;Can reach both Entrance Stairs-Number of Steps: 4 (in the back)   Home Layout: One level Home Equipment: Grab bars - tub/shower;BSC/3in1;Other (comment) (hurrycane)      Prior Function Prior Level of Function : Independent/Modified Independent;History of Falls (last six months);Driving    Mobility Comments: ModI with hurrycane, x2-3 flls due to tripping over rugs (family has removed) ADLs Comments: Uses pill box     Extremity/Trunk Assessment   Upper Extremity Assessment Upper Extremity Assessment: Defer to OT evaluation    Lower Extremity Assessment Lower Extremity Assessment: RLE deficits/detail RLE Deficits / Details: Pain on R dorsum and medial longitudinal arch. Able to fully weight-bear, however, difficult to take steps RLE Sensation: WNL    Cervical / Trunk Assessment Cervical / Trunk Assessment: Normal  Communication   Communication Communication: No apparent difficulties    Cognition Arousal: Alert Behavior During Therapy: WFL for tasks assessed/performed   PT - Cognitive impairments: No apparent impairments    Following commands: Intact       Cueing Cueing Techniques: Verbal cues      PT Assessment Patient needs continued PT services  PT Problem List Decreased strength;Decreased activity tolerance;Decreased balance;Decreased mobility       PT Treatment Interventions DME instruction;Gait training;Stair training;Functional mobility training;Therapeutic activities;Balance  training;Therapeutic exercise;Neuromuscular re-education;Patient/family education    PT Goals (Current goals can be found in the Care Plan section)  Acute Rehab PT Goals Patient Stated Goal: to feel better and be able to go home PT Goal Formulation: With patient Time For Goal Achievement: 03/09/24 Potential to Achieve Goals: Good    Frequency Min 3X/week        AM-PAC PT 6 Clicks Mobility  Outcome Measure Help needed turning from your back to your side while in a flat bed without using bedrails?: A Little Help needed moving from lying on your back to sitting on the side of a flat bed without using bedrails?: A Little Help needed moving to and from a bed to a chair (including a wheelchair)?: A Lot Help needed standing up from a chair using your arms (e.g., wheelchair or bedside chair)?: A Lot Help needed to walk in hospital room?: Total Help needed climbing 3-5 steps with a railing? : Total 6 Click Score: 12    End of Session Equipment Utilized During Treatment: Gait belt Activity Tolerance: Patient limited by pain Patient left: in bed;with call bell/phone within reach Nurse Communication: Mobility status;Other (comment) (HR) PT Visit Diagnosis: Unsteadiness on feet (R26.81);Other abnormalities of gait and mobility (R26.89);Muscle weakness (generalized) (M62.81);History of falling (Z91.81)    Time: 8961-8942 PT Time Calculation (min) (ACUTE ONLY): 19 min   Charges:   PT Evaluation $PT Eval Low Complexity: 1 Low   PT General Charges $$ ACUTE PT VISIT: 1 Visit    Dylan Roth, PT, DPT Secure Chat Preferred  Rehab Office [phone]   Dylan Roth Wendolyn 02/24/2024, 11:10 AM

## 2024-02-24 NOTE — Assessment & Plan Note (Signed)
Continue with tamsulosin 

## 2024-02-25 ENCOUNTER — Ambulatory Visit: Payer: Self-pay | Admitting: Cardiology

## 2024-02-25 ENCOUNTER — Telehealth: Payer: Self-pay | Admitting: Home Health

## 2024-02-25 DIAGNOSIS — N401 Enlarged prostate with lower urinary tract symptoms: Secondary | ICD-10-CM

## 2024-02-25 DIAGNOSIS — I2510 Atherosclerotic heart disease of native coronary artery without angina pectoris: Secondary | ICD-10-CM | POA: Diagnosis not present

## 2024-02-25 DIAGNOSIS — I10 Essential (primary) hypertension: Secondary | ICD-10-CM | POA: Diagnosis not present

## 2024-02-25 DIAGNOSIS — I480 Paroxysmal atrial fibrillation: Secondary | ICD-10-CM | POA: Diagnosis not present

## 2024-02-25 DIAGNOSIS — I5033 Acute on chronic diastolic (congestive) heart failure: Secondary | ICD-10-CM | POA: Diagnosis not present

## 2024-02-25 DIAGNOSIS — M109 Gout, unspecified: Secondary | ICD-10-CM

## 2024-02-25 LAB — BASIC METABOLIC PANEL WITH GFR
Anion gap: 12 (ref 5–15)
BUN: 35 mg/dL — ABNORMAL HIGH (ref 8–23)
CO2: 26 mmol/L (ref 22–32)
Calcium: 8.4 mg/dL — ABNORMAL LOW (ref 8.9–10.3)
Chloride: 93 mmol/L — ABNORMAL LOW (ref 98–111)
Creatinine, Ser: 1.71 mg/dL — ABNORMAL HIGH (ref 0.61–1.24)
GFR, Estimated: 39 mL/min — ABNORMAL LOW
Glucose, Bld: 80 mg/dL (ref 70–99)
Potassium: 4.5 mmol/L (ref 3.5–5.1)
Sodium: 130 mmol/L — ABNORMAL LOW (ref 135–145)

## 2024-02-25 LAB — MAGNESIUM: Magnesium: 2.3 mg/dL (ref 1.7–2.4)

## 2024-02-25 MED ORDER — POLYETHYLENE GLYCOL 3350 17 G PO PACK
17.0000 g | PACK | Freq: Every day | ORAL | Status: DC
  Filled 2024-02-25: qty 1

## 2024-02-25 NOTE — Progress Notes (Signed)
 " Progress Note   Patient: Dylan Roth FMW:[id number] DOB: 05-Sep-1940 DOA: 02/22/2024     2 DOS: the patient was seen and examined on 02/25/2024   Brief hospital course: Dylan Roth was admitted to the hospital with the working diagnosis of heart failure exacerbation  84 yo male with the past medical history of coronary artery disease, paroxysmal atrial fibrillation, hypertension, dyslipidemia, and sinus node dysfunction sp pacemaker implantation who presented with dyspnea. 01/20 Patient was found to have increased in atrial fibrillation burden as outpatient, he was placed on amiodarone  and schedule for atrial fibrillation ablation in February.  Despite antiarrhythmic therapy patient continue to have worsening dyspnea on exertion, to the point where he was having symptoms with minimal efforts.  No lower extremity edema but increased abdominal girth.  Positive 6 lbs weight gain over the last month  On his initial physical examination his blood pressure was 131/69, HR 60 RR 21 and 02 saturation 100%  Lungs with no wheezing or rales, decreased breath sounds at the right base, heart with S1 and S2 present and regular with no gallops, rubs or murmurs, abdomen with no distention and positive trace lower extremity edema.   Na 134, K 5.0 Cl 99 bicarbonate 24 glucose 100 bun 27 cr 1,29  AST 32 ALT 32  Pro BNP 1,419 High sensitive troponin 44 Wbc 5.7 hgb 121.5 plt 168  Influenza negative RSV negative SARS covid 19 negative   Chest radiograph left rotation, with hypoinflation, positive cardiomegaly, bilateral hilar vascular congestion, central interstitial infiltrates, bilateral pleural effusions more right than left.   EKG 61 bpm, left axis deviation, left bundle branch block, qtc 432, atrial and ventricular pacing 100%, with no significant ST segment or T wave changes.   Patient was placed on furosemide  for diuresis   01/25 echocardiogram with preserved LV systolic function with grade II  diastolic dysfunction. Improved volume status, holding IV furosemide   01/26 improved volume status, pending renal function to be more stable prior to his discharge.   Assessment and Plan: * Acute on chronic diastolic CHF (congestive heart failure) (HCC) Echocardiogram with preserved LV systolic function, EF 50 to 55%, no regional wall motion abnormalities, grade II diastolic dysfunction, with pseudo normalization, RVSP 26.4 mmHg, RV systolic function mildly reduced, severe dilatation LA and RA, trivial mitral valve regurgitation, no pericardial effusion   Urine output 750 ml Since admission -3,360 ml Systolic blood pressure 100 mmHg range   Continue lisinopril  and carvedilol  Continue with SGLT 2 inh to augment diuresis  Continue to hold on furosemide    Acute cardiogenic pulmonary edema with bilateral pleural effusions  Oxymetry monitoring and supplemental 02 per [place] to keep 02 saturation 92% or grater.  02 saturation today 92% on room air   CAD (coronary artery disease) Sp CABG No chest pain, elevation in high sensitive troponin due to heart failure, no acute coronary syndrome  Continue blood pressure control  Continue atorvastatin , isosorbide  and ranolazine   HTN (hypertension) Continue blood pressure control with carvedilol  and lisinopril   Atrial fibrillation/flutter Currently paced rhythm atrial and ventricular.  Pacemaker related left bundle branch block  Continue carvedilol  for rate control and anticoagulation with apixaban   Continue amiodarone    AKI (acute kidney injury) Hyponatremia   Renal function not yet stable with serum cr at 1,72 with K at 4,5 and serum bicarbonate at 26  Na 130 and Mg 2.3   Continue to hold on loop diuretic, avoid hypotension and nephrotoxic medications Check renal function and electrolytes in am.  Dyslipidemia Continue with statin therapy   BPH (benign prostatic hyperplasia) Continue with tamsulosin    Gout Resume allopurinol .  Sp  one dose of colchicine  0,6, for signs of early gout attack with resolution of his symptoms      Subjective: Patient with no chest pain and no dyspnea, no orthopnea, PND or lower extremity edema, no abdominal distention, feeling back to baseline   Physical Exam: Vitals:   02/24/24 1925 02/25/24 0000 02/25/24 0347 02/25/24 0725  BP: 107/65 105/86 96/62 (!) 138/94  Pulse: 70 70 70 (!) 59  Resp: 18 18 18 18   Temp: 98.3 F (36.8 C) 97.6 F (36.4 C) 98.2 F (36.8 C) 97.6 F (36.4 C)  TempSrc: Oral Oral Oral Oral  SpO2: 95% 91% 94% 92%  Weight:      Height:       Neurology awake and alert ENT with mild pallor Cardiovascular with S1 and S2 present and regular with no gallops rubs or murmurs No JVD Respiratory with no rales or wheezing, no rhonchi Abdomen with no distention No lower extremity edema   Data Reviewed:    Family Communication: no family at the bedside   Disposition: Status is: Inpatient Remains inpatient appropriate because: pending renal function to improve   Planned Discharge Destination: Home     Author: Elidia Toribio Furnace, MD 02/25/2024 12:02 PM  For on call review [email or web address]  "

## 2024-02-25 NOTE — Progress Notes (Signed)
 Patient designated Dylan Roth, daughter as the point of contact for any medical needs. CN confirmed with patient. Primary RN made aware.

## 2024-02-25 NOTE — TOC Progression Note (Signed)
 Transition of Care [facility]) - Progression Note    Patient Details  Name: Dylan Roth MRN: [id number] Date of Birth: 27-May-1940  Transition of Care [facility]) CM/SW Contact  Waddell Barnie Rama, RN Phone Number: 02/25/2024, 4:17 PM  Clinical Narrative:    Per CSW note, patient has decline SNF and wants HH, he does not have a preference.  NCM sent referral thru portal to [facility], awaiting to hear back.  [facility] has accepted referral for HHPT, HHOT, SW,  Soc will begin 24 to 48 hrs post dc.    Expected Discharge Plan: Home w Home Health Services Barriers to Discharge: Continued Medical Work up               Expected Discharge Plan and Services In-house Referral: Clinical Social Work Discharge Planning Services: CM Consult Post Acute Care Choice: Home Health Living arrangements for the past 2 months: Single Family Home                                       Social Drivers of Health (SDOH) Interventions SDOH Screenings   Food Insecurity: No Food Insecurity (02/24/2024)  Housing: Low Risk (02/24/2024)  Transportation Needs: No Transportation Needs (02/24/2024)  Utilities: Not At Risk (02/24/2024)  Physical Activity: Unknown (07/03/2022)   Received from [facility]  Social Connections: Unknown (02/24/2024)  Tobacco Use: Medium Risk (02/22/2024)    Readmission Risk Interventions     No data to display

## 2024-02-25 NOTE — Evaluation (Signed)
 Occupational Therapy Evaluation Patient Details Name: Dylan Roth MRN: [id number] DOB: 30-Sep-1940 Today's Date: 02/25/2024   History of Present Illness   84 y.o. male presents to [facility] 02/22/24 with SOB and increased a-fib burden. Admitted with acute on chronic CHF. Chest x-ray showed R sided pleural effusion w/ atelectasis and cardiomegaly. PMHx: coronary artery disease status post CABG in 2005, obesity, paroxysmal A-fib/flutter, sinus node dysfunction status post PPM, HTN, HLD     Clinical Impressions Pt admitted with the above diagnosis and has the deficits listed below. Pt would benefit from cont OT to increase independence with basic adls and adl transfers to mod I level of care so he can d/c home. Pt's daughter has agreed to come stay with pt for a few weeks. Feel pt is safe to d/c home with this 24 hour supervision. Pt remains slightly unsteady on his feet during adls and adl transfers so will recommend HHOT to follow up with tub and toilet transfers at d/c in his own environment.       If plan is discharge home, recommend the following:   A little help with walking and/or transfers;A little help with bathing/dressing/bathroom;Assistance with cooking/housework     Functional Status Assessment   Patient has had a recent decline in their functional status and demonstrates the ability to make significant improvements in function in a reasonable and predictable amount of time.     Equipment Recommendations   None recommended by OT     Recommendations for Other Services         Precautions/Restrictions   Precautions Precautions: Fall Recall of Precautions/Restrictions: Intact Precaution/Restrictions Comments: watch HR Restrictions Weight Bearing Restrictions Per Provider Order: No     Mobility Bed Mobility               General bed mobility comments: Pt was in bathroom on arrival    Transfers Overall transfer level: Needs assistance Equipment  used: Rolling walker (2 wheels) Transfers: Sit to/from Stand, Bed to chair/wheelchair/BSC Sit to Stand: Min assist     Step pivot transfers: Contact guard assist     General transfer comment: Cues to push up from the surface he is leaving and not to pull on the walker.      Balance Overall balance assessment: Needs assistance, Mild deficits observed, not formally tested, History of Falls Sitting-balance support: Feet supported, Bilateral upper extremity supported Sitting balance-Leahy Scale: Good     Standing balance support: Bilateral upper extremity supported, During functional activity, Reliant on assistive device for balance Standing balance-Leahy Scale: Fair Standing balance comment: Pt able to stand at sink to bathe bottom and brush teeth with supervision. When walking pt is dependent on an assistive device.                           ADL either performed or assessed with clinical judgement   ADL Overall ADL's : Needs assistance/impaired Eating/Feeding: Independent;Sitting   Grooming: Wash/dry hands;Wash/dry face;Oral care;Supervision/safety;Standing   Upper Body Bathing: Set up;Sitting   Lower Body Bathing: Supervison/ safety;Sit to/from stand Lower Body Bathing Details (indicate cue type and reason): pulled up on sink to stand Upper Body Dressing : Set up;Sitting   Lower Body Dressing: Minimal assistance;Sit to/from stand;Cueing for compensatory techniques Lower Body Dressing Details (indicate cue type and reason): min assist to get to full stand Toilet Transfer: Contact guard assist;Ambulation;Rolling walker (2 wheels);Grab bars Toilet Transfer Details (indicate cue type and reason): pt walked to  bathroom with walker. Toileting- Clothing Manipulation and Hygiene: Contact guard assist;Sit to/from stand Toileting - Clothing Manipulation Details (indicate cue type and reason): min guard to stand to manage pants.     Functional mobility during ADLs: Minimal  assistance;Cane General ADL Comments: Pt close to baseline but does remain slightly unsteady on his feet with use of cane and with use of walker (not as familiar with use of walker).  Daughter to come stay with pt.     Vision Baseline Vision/History: 0 No visual deficits Ability to See in Adequate Light: 0 Adequate Patient Visual Report: No change from baseline Vision Assessment?: No apparent visual deficits Additional Comments: wears glasses for reading     Perception Perception: Within Functional Limits       Praxis Praxis: WFL       Pertinent Vitals/Pain Pain Assessment Pain Assessment: No/denies pain Faces Pain Scale: No hurt     Extremity/Trunk Assessment Upper Extremity Assessment Upper Extremity Assessment: Overall WFL for tasks assessed   Lower Extremity Assessment Lower Extremity Assessment: Defer to PT evaluation   Cervical / Trunk Assessment Cervical / Trunk Assessment: Normal   Communication Communication Communication: No apparent difficulties   Cognition Arousal: Alert Behavior During Therapy: WFL for tasks assessed/performed Cognition: No apparent impairments                               Following commands: Intact       Cueing  General Comments   Cueing Techniques: Verbal cues  Pt making good progress. Pain in foot is resolved making ambulation easier.  Pt continues with mild balance deficits.   Exercises     Shoulder Instructions      Home Living Family/patient expects to be discharged to:: Private residence Living Arrangements: Alone Available Help at Discharge: Family;Available 24 hours/day (daughter to come live with pt for a few weeks) Type of Home: House Home Access: Stairs to enter [employer] of Steps: 4 Entrance Stairs-Rails: Right;Left;Can reach both Home Layout: One level     Bathroom Shower/Tub: Chief Strategy Officer: Handicapped height Bathroom Accessibility: Yes How Accessible:  Accessible via walker Home Equipment: Grab bars - tub/shower;BSC/3in1;Other (comment)   Additional Comments: pt lives independentlly and neighbors check on him      Prior Functioning/Environment Prior Level of Function : Independent/Modified Independent;History of Falls (last six months);Driving             Mobility Comments: ModI with hurrycane, x2-3 flls due to tripping over rugs (family has removed) ADLs Comments: Uses pill box    OT Problem List: Impaired balance (sitting and/or standing);Decreased knowledge of use of DME or AE   OT Treatment/Interventions: Self-care/ADL training;Therapeutic activities;Balance training;DME and/or AE instruction      OT Goals(Current goals can be found in the care plan section)   Acute Rehab OT Goals Patient Stated Goal: to hopefully go home OT Goal Formulation: With patient Time For Goal Achievement: 03/10/24 Potential to Achieve Goals: Good ADL Goals Pt Will Perform Tub/Shower Transfer: Tub transfer;with supervision;ambulating;grab bars;rolling walker Additional ADL Goal #1: Pt will walk to bathroom with assitive device and toilet with mod I Additional ADL Goal #2: Pt will gather clothing with assitive device and dress self with mod I   OT Frequency:  Min 2X/week    Co-evaluation              AM-PAC OT 6 Clicks Daily Activity  Outcome Measure Help from another person eating meals?: None Help from another person taking care of personal grooming?: A Little Help from another person toileting, which includes using toliet, bedpan, or urinal?: A Little Help from another person bathing (including washing, rinsing, drying)?: A Little Help from another person to put on and taking off regular upper body clothing?: A Little Help from another person to put on and taking off regular lower body clothing?: A Little 6 Click Score: 19   End of Session Equipment Utilized During Treatment: Rolling walker (2 wheels);Gait belt;Other  (comment) (cane) Nurse Communication: Mobility status  Activity Tolerance: Patient tolerated treatment well Patient left: in bed;with call bell/phone within reach;Other (comment) (eating breakfast)  OT Visit Diagnosis: Unsteadiness on feet (R26.81);History of falling (Z91.81)                Time: 9254-9182 OT Time Calculation (min): 32 min Charges:  OT General Charges $OT Visit: 1 Visit OT Evaluation $OT Eval Moderate Complexity: 1 Mod OT Treatments $Self Care/Home Management : 8-22 mins  Joshua Silvano Dragon 02/25/2024, 8:27 AM

## 2024-02-25 NOTE — Telephone Encounter (Signed)
 Patient's daughter Dylan Roth called after hour line today, states patient is currently hospitalized for CHF and feels miserable, wondering if he can have early ablation. Explained that there is no emergency ablation for A flutter. Will forward the message to Dr Kennyth and see if any early spot available for ablation while he is here. Please reach out to his daughter Dylan Roth at [phone], thanks.

## 2024-02-26 ENCOUNTER — Telehealth (HOSPITAL_COMMUNITY): Payer: Self-pay

## 2024-02-26 ENCOUNTER — Other Ambulatory Visit (HOSPITAL_COMMUNITY): Payer: Self-pay

## 2024-02-26 ENCOUNTER — Telehealth: Payer: Self-pay | Admitting: Cardiology

## 2024-02-26 ENCOUNTER — Telehealth: Payer: Self-pay

## 2024-02-26 DIAGNOSIS — I2510 Atherosclerotic heart disease of native coronary artery without angina pectoris: Secondary | ICD-10-CM | POA: Diagnosis not present

## 2024-02-26 DIAGNOSIS — I5033 Acute on chronic diastolic (congestive) heart failure: Secondary | ICD-10-CM | POA: Diagnosis not present

## 2024-02-26 DIAGNOSIS — I10 Essential (primary) hypertension: Secondary | ICD-10-CM | POA: Diagnosis not present

## 2024-02-26 DIAGNOSIS — I480 Paroxysmal atrial fibrillation: Secondary | ICD-10-CM | POA: Diagnosis not present

## 2024-02-26 LAB — RENAL FUNCTION PANEL
Albumin: 3.6 g/dL (ref 3.5–5.0)
Anion gap: 11 (ref 5–15)
BUN: 37 mg/dL — ABNORMAL HIGH (ref 8–23)
CO2: 26 mmol/L (ref 22–32)
Calcium: 8.2 mg/dL — ABNORMAL LOW (ref 8.9–10.3)
Chloride: 93 mmol/L — ABNORMAL LOW (ref 98–111)
Creatinine, Ser: 1.52 mg/dL — ABNORMAL HIGH (ref 0.61–1.24)
GFR, Estimated: 45 mL/min — ABNORMAL LOW
Glucose, Bld: 78 mg/dL (ref 70–99)
Phosphorus: 3.6 mg/dL (ref 2.5–4.6)
Potassium: 4 mmol/L (ref 3.5–5.1)
Sodium: 130 mmol/L — ABNORMAL LOW (ref 135–145)

## 2024-02-26 MED ORDER — EMPAGLIFLOZIN 10 MG PO TABS
10.0000 mg | ORAL_TABLET | Freq: Every day | ORAL | 0 refills | Status: AC
  Filled 2024-02-26: qty 30, 30d supply, fill #0

## 2024-02-26 MED ORDER — ISOSORBIDE MONONITRATE ER 30 MG PO TB24: 30.0000 mg | ORAL_TABLET | Freq: Every day | ORAL | Status: AC

## 2024-02-26 MED ORDER — FUROSEMIDE 20 MG PO TABS: 20.0000 mg | ORAL_TABLET | Freq: Every day | ORAL | Status: DC

## 2024-02-26 MED ORDER — FUROSEMIDE 20 MG PO TABS
20.0000 mg | ORAL_TABLET | Freq: Every day | ORAL | 0 refills | Status: AC
  Filled 2024-02-26: qty 30, 30d supply, fill #0

## 2024-02-26 MED ORDER — ISOSORBIDE MONONITRATE ER 30 MG PO TB24: 30.0000 mg | ORAL_TABLET | Freq: Every day | ORAL | Status: DC

## 2024-02-26 MED ORDER — AMIODARONE HCL 200 MG PO TABS
ORAL_TABLET | ORAL | 0 refills | Status: AC
  Filled 2024-02-26: qty 51, 30d supply, fill #0

## 2024-02-26 MED ORDER — AMIODARONE HCL 200 MG PO TABS: 400.0000 mg | ORAL_TABLET | Freq: Two times a day (BID) | ORAL | Status: DC

## 2024-02-26 NOTE — Progress Notes (Signed)
 Heart Failure Navigator Progress Note  Assessed for Heart & Vascular TOC clinic readiness.  Patient does not meet criteria due to EF 50-55%,No HF TOC per Dr. Noralee, patient will follow up with [facility]. .   Navigator will sign off at this time.    Stephane Haddock, BSN, Scientist, Clinical (histocompatibility And Immunogenetics) Only

## 2024-02-26 NOTE — Discharge Summary (Addendum)
 " Physician Discharge Summary   Patient: Dylan Roth MRN: [id number] DOB: 30-May-1940  Admit date:     02/22/2024  Discharge date: 02/26/24  Discharge Physician: Elidia Toribio Furnace   PCP: Ryan Powell HERO., MD   Recommendations at discharge:    Patient has been placed on SGLT 2 inh, and continue lisinopril  and carvedilol  for guideline directed medical therapy. To consider adding mineralocorticoid receptor blocker as outpatient if renal function stable.  Loop diuretic with furosemide  20 mg po daily Decreased isosorbide  from 30 mg bid to daily to avoid hypotension  Oral load of amiodarone  400 mg bid for one week and then continue with 200 mg po daily, per EP recommendations.  Plan for atrial fibrillation ablation 03/28/24  Follow up renal function and electrolytes in 7 days as outpatient  Follow up with Dr Ryan in 7 to 10 days Follow up with Cardiology as scheduled   I spoke with patient's daughters at the bedside, we talked in detail about patient's condition, plan of care and prognosis and all questions were addressed.   Discharge Diagnoses: Principal Problem:   Acute on chronic diastolic CHF (congestive heart failure) (HCC) Active Problems:   CAD (coronary artery disease)   HTN (hypertension)   Atrial fibrillation/flutter   AKI (acute kidney injury)   Dyslipidemia   BPH (benign prostatic hyperplasia)   Gout  Resolved Problems:   * No resolved hospital problems. [facility] Course: Dylan Roth was admitted to the hospital with the working diagnosis of heart failure exacerbation  84 yo male with the past medical history of coronary artery disease, paroxysmal atrial fibrillation, hypertension, dyslipidemia, and sinus node dysfunction sp pacemaker implantation who presented with dyspnea. 01/20 Patient was found to have increased in atrial fibrillation burden as outpatient, he was placed on amiodarone  and schedule for atrial fibrillation ablation in February.  Despite  antiarrhythmic therapy patient continue to have worsening dyspnea on exertion, to the point where he was having symptoms with minimal efforts.  No lower extremity edema but increased abdominal girth.  Positive 6 lbs weight gain over the last month  On his initial physical examination his blood pressure was 131/69, HR 60 RR 21 and 02 saturation 100%  Lungs with no wheezing or rales, decreased breath sounds at the right base, heart with S1 and S2 present and regular with no gallops, rubs or murmurs, abdomen with no distention and positive trace lower extremity edema.   Na 134, K 5.0 Cl 99 bicarbonate 24 glucose 100 bun 27 cr 1,29  AST 32 ALT 32  Pro BNP 1,419 High sensitive troponin 44 Wbc 5.7 hgb 121.5 plt 168  Influenza negative RSV negative SARS covid 19 negative   Chest radiograph left rotation, with hypoinflation, positive cardiomegaly, bilateral hilar vascular congestion, central interstitial infiltrates, bilateral pleural effusions more right than left.   EKG 61 bpm, left axis deviation, left bundle branch block, qtc 432, atrial and ventricular pacing 100%, with no significant ST segment or T wave changes.   Patient was placed on furosemide  for diuresis   01/25 echocardiogram with preserved LV systolic function with grade II diastolic dysfunction. Improved volume status, holding IV furosemide   01/26 improved volume status, pending renal function to be more stable prior to his discharge.  01/27 renal function improved, plan for discharge home today and follow up as outpatient.   Assessment and Plan: * Acute on chronic diastolic CHF (congestive heart failure) (HCC) Echocardiogram with preserved LV systolic function, EF 50 to 55%, no regional  wall motion abnormalities, grade II diastolic dysfunction, with pseudo normalization, RVSP 26.4 mmHg, RV systolic function mildly reduced, severe dilatation LA and RA, trivial mitral valve regurgitation, no pericardial effusion   Patient was  placed on IV furosemide  for diuresis, negative fluid balance was achieved -3,560 ml with significant improvement in his symptoms.  Systolic blood pressure 100 mmHg range   Continue lisinopril  and carvedilol  Continue with SGLT 2 inh and daily furosemide  20 mg.   Acute cardiogenic pulmonary edema with bilateral pleural effusions, clinically resolved after diuresis.  His 02 saturation today 95% on room air   CAD (coronary artery disease) Sp CABG No chest pain, elevation in high sensitive troponin due to heart failure, no acute coronary syndrome  Continue blood pressure control  Continue atorvastatin , isosorbide  and ranolazine   HTN (hypertension) Continue blood pressure control with carvedilol  and lisinopril   Atrial fibrillation/flutter Currently paced atrial and ventricular rhythm   Pacemaker related left bundle branch block  Continue carvedilol  for rate control and anticoagulation with apixaban   Continue amiodarone  Follow up as outpatient for atrial fibrillation ablation on 02/27 I contacted EP team with recommendation for oral load of amiodarone  400 mg bid for one week and then continue with 200 mg po daily   AKI (acute kidney injury) Hyponatremia   At the time of discharge renal function is improving with serum cr at 1.52 with K at 4.0 and serum bicarbonate at 26  Na 130   Plan to continue SGLT 2 inh and resume loop diuretic tomorrow 01/28, follow up renal function and electrolytes as outpatient in 7 days    Dyslipidemia Continue with statin therapy   BPH (benign prostatic hyperplasia) Continue with tamsulosin    Gout Resume allopurinol .  Sp one dose of colchicine  0,6, for signs of early gout attack with resolution of his symptoms       Consultants: none  Procedures performed: none   Disposition: Home Diet recommendation:  Cardiac diet DISCHARGE MEDICATION: Allergies as of 02/26/2024       Reactions   Sulfa Antibiotics Other (See Comments)   SWELLING ON  PATIENTS LIPS, HE GETS DRIED UP   Sulfamethoxazole Anaphylaxis   SWELLING ON PATIENTS LIPS, HE GETS DRIED U   Sulfamethoxazole-trimethoprim Anaphylaxis   SWELLING ON PATIENTS LIPS, HE GETS DRIED U        Medication List     TAKE these medications    acetaminophen  500 MG tablet Commonly known as: TYLENOL  Take 1,000 mg by mouth every 6 (six) hours as needed for mild pain (pain score 1-3).   allopurinol  100 MG tablet Commonly known as: ZYLOPRIM  Take 100 mg by mouth in the morning.   amiodarone  200 MG tablet Commonly known as: PACERONE  Take 1 tablet (200 mg total) by mouth daily.   atorvastatin  80 MG tablet Commonly known as: LIPITOR  Take 80 mg by mouth at bedtime.   carvedilol  3.125 MG tablet Commonly known as: COREG  TAKE 1 TABLET TWICE DAILY WITH MEALS   diclofenac  Sodium 1 % Gel Commonly known as: VOLTAREN  Apply 2 g topically 4 (four) times daily as needed (joint pain).   Eliquis  5 MG Tabs tablet Generic drug: apixaban  TAKE 1 TABLET TWICE DAILY   empagliflozin  10 MG Tabs tablet Commonly known as: JARDIANCE  Take 1 tablet (10 mg total) by mouth daily. Start taking on: February 27, 2024   escitalopram  10 MG tablet Commonly known as: LEXAPRO  Take 10 mg by mouth at bedtime.   furosemide  20 MG tablet Commonly known as: LASIX  Take 1 tablet (  20 mg total) by mouth daily. Start taking on: February 27, 2024   isosorbide  mononitrate 30 MG 24 hr tablet Commonly known as: IMDUR  Take 1 tablet (30 mg total) by mouth daily. What changed: when to take this   lisinopril  5 MG tablet Commonly known as: ZESTRIL  Take 2.5 mg by mouth daily.   nitroGLYCERIN  0.4 MG SL tablet Commonly known as: NITROSTAT  DISSOLVE 1 TABLET UNDER THE TONGUE EVERY 5 MINUTES AS NEEDED FOR CHEST PAIN. DO NOT EXCEED A TOTAL OF 3 DOSES IN 15 MINUTES   ranolazine  500 MG 12 hr tablet Commonly known as: RANEXA  TAKE 1 TABLET TWICE DAILY   Salonpas Pain Relieving 4 % Generic drug: lidocaine  Place 1  patch onto the skin daily as needed (shoulder pain).   tamsulosin  0.4 MG Caps capsule Commonly known as: FLOMAX  Take 0.4 mg by mouth in the morning.        Contact information for after-discharge care     Home Medical Care     [facility]) .   Service: Home Health Services Why: Agency will call you to set up apt times Contact information: [address] [facility] [place]  [address] [phone]                    Discharge Exam: Fredricka Weights   02/22/24 2037 02/23/24 1457 02/26/24 0442  Weight: 83.9 kg 83 kg 81.5 kg   BP 96/70 (BP Location: Right Arm)   Pulse 85   Temp (!) 97.3 F (36.3 C) (Oral)   Resp 16   Ht 5' 7 (1.702 m)   Wt 81.5 kg   SpO2 96%   BMI 28.14 kg/m   Patient with no chest pain and no dyspnea, no lower extremity edema, PND or orthopnea, he has been ambulating and climbing steps with PT, no further abdominal distention   Neurology awake and alert ENT with mild pallor with no icterus Cardiovascular with S1 and S2 present and regular with no gallops or rubs, no murmurs No JVD Respiratory with no rales or wheezing, no rhonchi Abdomen with no distention  No lower extremity edema   Condition at discharge: stable  The results of significant diagnostics from this hospitalization (including imaging, microbiology, ancillary and laboratory) are listed below for reference.   Imaging Studies: ECHOCARDIOGRAM COMPLETE Result Date: 02/23/2024    ECHOCARDIOGRAM REPORT   Patient Name:   NOOR VIDALES Date of Exam: 02/23/2024 Medical Rec #:  [id number]         Height:       66.5 in Accession #:    [id number]        Weight:       185.0 lb Date of Birth:  [birth year]        BSA:          1.945 m Patient Age:    83 years          BP:           120/69 mmHg Patient Gender: M                 HR:           65 bpm. Exam Location:  Inpatient Procedure: 2D Echo (Both Spectral and Color Flow Doppler were utilized during             procedure). Indications:    acute diastolic chf  History:        Patient has prior  history of Echocardiogram examinations, most                 recent 12/07/2022. CAD, Pacemaker, Arrythmias:Atrial Fibrillation                 and Atrial Flutter, Signs/Symptoms:Shortness of Breath; Risk                 Factors:Hypertension and Dyslipidemia.  Sonographer:    Tinnie Barefoot RDCS Referring Phys: [id number] NILDA HERO GHERGHE IMPRESSIONS  1. Left ventricular ejection fraction, by estimation, is 50 to 55%. The left ventricle has low normal function. The left ventricle has no regional wall motion abnormalities. Left ventricular diastolic parameters are consistent with Grade II diastolic dysfunction (pseudonormalization). Elevated left atrial pressure.  2. Right ventricular systolic function is mildly reduced. The right ventricular size is normal. There is normal pulmonary artery systolic pressure. The estimated right ventricular systolic pressure is 26.4 mmHg.  3. Left atrial size was severely dilated.  4. Right atrial size was severely dilated.  5. The mitral valve is normal in structure. Trivial mitral valve regurgitation.  6. The aortic valve is tricuspid. There is mild calcification of the aortic valve. There is mild thickening of the aortic valve. Aortic valve regurgitation is trivial. Aortic valve sclerosis/calcification is present, without any evidence of aortic stenosis. Comparison(s): No significant change from prior study. Prior images reviewed side by side. FINDINGS  Left Ventricle: Left ventricular ejection fraction, by estimation, is 50 to 55%. The left ventricle has low normal function. The left ventricle has no regional wall motion abnormalities. The left ventricular internal cavity size was normal in size. There is no left ventricular hypertrophy. Abnormal (paradoxical) septal motion, consistent with RV pacemaker. Left ventricular diastolic parameters are consistent with Grade II diastolic dysfunction  (pseudonormalization). Elevated left atrial pressure. Right Ventricle: The right ventricular size is normal. No increase in right ventricular wall thickness. Right ventricular systolic function is mildly reduced. There is normal pulmonary artery systolic pressure. The tricuspid regurgitant velocity is 2.42 m/s, and with an assumed right atrial pressure of 3 mmHg, the estimated right ventricular systolic pressure is 26.4 mmHg. Left Atrium: Left atrial size was severely dilated. Right Atrium: Right atrial size was severely dilated. Pericardium: There is no evidence of pericardial effusion. Mitral Valve: The mitral valve is normal in structure. Mild mitral annular calcification. Trivial mitral valve regurgitation. Tricuspid Valve: The tricuspid valve is normal in structure. Tricuspid valve regurgitation is mild. Aortic Valve: The aortic valve is tricuspid. There is mild calcification of the aortic valve. There is mild thickening of the aortic valve. Aortic valve regurgitation is trivial. Aortic regurgitation PHT measures 594 msec. Aortic valve sclerosis/calcification is present, without any evidence of aortic stenosis. Pulmonic Valve: The pulmonic valve was grossly normal. Pulmonic valve regurgitation is not visualized. No evidence of pulmonic stenosis. Aorta: The aortic root and ascending aorta are structurally normal, with no evidence of dilitation. IAS/Shunts: No atrial level shunt detected by color flow Doppler. Additional Comments: A device lead is visualized in the right ventricle.  LEFT VENTRICLE PLAX 2D LVIDd:         4.20 cm     Diastology LVIDs:         3.00 cm     LV e' medial:    5.00 cm/s LV PW:         1.00 cm     LV E/e' medial:  17.7 LV IVS:        1.00 cm  LV e' lateral:   6.96 cm/s LVOT diam:     1.90 cm     LV E/e' lateral: 12.7 LV SV:         54 LV SV Index:   28 LVOT Area:     2.84 cm  LV Volumes (MOD) LV vol d, MOD A2C: 65.5 ml LV vol d, MOD A4C: 63.8 ml LV vol s, MOD A2C: 33.7 ml LV vol s,  MOD A4C: 32.2 ml LV SV MOD A2C:     31.8 ml LV SV MOD A4C:     63.8 ml LV SV MOD BP:      31.0 ml RIGHT VENTRICLE            IVC RV Basal diam:  3.50 cm    IVC diam: 1.60 cm RV S prime:     9.25 cm/s TAPSE (M-mode): 2.1 cm LEFT ATRIUM              Index        RIGHT ATRIUM           Index LA diam:        4.10 cm  2.11 cm/m   RA Area:     23.20 cm LA Vol (A2C):   104.0 ml 53.46 ml/m  RA Volume:   78.30 ml  40.25 ml/m LA Vol (A4C):   82.5 ml  42.41 ml/m LA Biplane Vol: 95.3 ml  48.99 ml/m  AORTIC VALVE LVOT Vmax:   93.20 cm/s LVOT Vmean:  59.100 cm/s LVOT VTI:    0.189 m AI PHT:      594 msec  AORTA Ao Root diam: 2.90 cm Ao Asc diam:  3.30 cm MITRAL VALVE               TRICUSPID VALVE MV Area (PHT): 4.24 cm    TR Peak grad:   23.4 mmHg MV Decel Time: 179 msec    TR Vmax:        242.00 cm/s MR Peak grad: 78.5 mmHg MR Vmax:      443.00 cm/s  SHUNTS MV E velocity: 88.60 cm/s  Systemic VTI:  0.19 m MV A velocity: 35.90 cm/s  Systemic Diam: 1.90 cm MV E/A ratio:  2.47 Mihai Croitoru MD Electronically signed by Jerel Balding MD Signature Date/Time: 02/23/2024/1:34:43 PM    Final    DG Chest 2 View Result Date: 02/22/2024 EXAM: 2 VIEW(S) XRAY OF THE CHEST 02/22/2024 09:39:40 PM COMPARISON: Prior examination of 04/06/2023. CLINICAL HISTORY: Shortness of breath. FINDINGS: LINES, TUBES AND DEVICES: Left dual lead pacemaker present. LUNGS AND PLEURA: Right pleural effusion with underlying atelectasis versus consolidation. The right pleural effusion is new from prior examination. No pneumothorax. HEART AND MEDIASTINUM: Cardiomegaly. Calcified aorta. CABG changes noted. Left dual lead pacemaker present. BONES AND SOFT TISSUES: Thoracic degenerative changes. Median sternotomy wires present. No acute osseous abnormality. IMPRESSION: 1. New right pleural effusion with underlying atelectasis versus consolidation. 2. Cardiomegaly Electronically signed by: Dorethia Molt MD 02/22/2024 09:43 PM EST RP Workstation: [id number]    CUP PACEART REMOTE DEVICE CHECK Result Date: 02/21/2024 PPM scheduled remote reviewed. Normal device function.  Presenting rhythm: AF-VP In office check on 02/19/24. Known AF. On OAC per chart. Next remote transmission per protocol. AB, CVRS  CUP PACEART INCLINIC DEVICE CHECK Result Date: 02/19/2024 Normal in-clinic dual chamber pacemaker check. Presenting Rhythm: AFib/VP. Routine testing of thresholds, sensing, and impedance demonstrate stable parameters. Multiple AMS episodes noted along with functional undersensing of flutter waves and  atrial over pacing with functional non-capture on atrial lead. There was also noise noted on the atrial lead during several AMS episodes. The noise was reproducible with isometrics. All episodes, sensing, and lead noise was discussed with MD and he is aware. Estimated longevity 6.6 years. Pt enrolled in remote follow-up. Atrial lead sensitivity adjusted from 0.75 mV ---> 0.5 mV at MD's direction to hopefully help address the atrial undersensing and over pacing issue.Bard Silvan, BSN, RN  CUP PACEART INCLINIC DEVICE CHECK Result Date: 01/28/2024 Normal in-clinic dual chamber pacemaker check. Presenting Rhythm: ASVP. Routine testing of thresholds, sensing, and impedance demonstrate stable parameters and no programming changes needed at this time. No R waves at 45bpm. Unable to check atrial threshold due to AFL. Increased afib burden, 28% since July 31 2023. Persistent atrial flutter episode starting 01/26/24. Estimated longevity 52yrs. Pt enrolled in remote follow-up. EW   Microbiology: Results for orders placed or performed during the hospital encounter of 02/22/24  Resp panel by RT-PCR (RSV, Flu A&B, Covid) Anterior Nasal Swab     Status: None   Collection Time: 02/22/24  8:48 PM   Specimen: Anterior Nasal Swab  Result Value Ref Range Status   SARS Coronavirus 2 by RT PCR NEGATIVE NEGATIVE Final   Influenza A by PCR NEGATIVE NEGATIVE Final   Influenza B by PCR  NEGATIVE NEGATIVE Final    Comment: (NOTE) The Xpert Xpress SARS-CoV-2/FLU/RSV plus assay is intended as an aid in the diagnosis of influenza from Nasopharyngeal swab specimens and should not be used as a sole basis for treatment. Nasal washings and aspirates are unacceptable for Xpert Xpress SARS-CoV-2/FLU/RSV testing.  Fact Sheet for Patients: bloggercourse.com  Fact Sheet for Healthcare Providers: seriousbroker.it  This test is not yet approved or cleared by the [place]  FDA and has been authorized for detection and/or diagnosis of SARS-CoV-2 by FDA under an Emergency Use Authorization (EUA). This EUA will remain in effect (meaning this test can be used) for the duration of the COVID-19 declaration under Section 564(b)(1) of the Act, 21 U.S.C. section 360bbb-3(b)(1), unless the authorization is terminated or revoked.     Resp Syncytial Virus by PCR NEGATIVE NEGATIVE Final    Comment: (NOTE) Fact Sheet for Patients: bloggercourse.com  Fact Sheet for Healthcare Providers: seriousbroker.it  This test is not yet approved or cleared by the [place]  FDA and has been authorized for detection and/or diagnosis of SARS-CoV-2 by FDA under an Emergency Use Authorization (EUA). This EUA will remain in effect (meaning this test can be used) for the duration of the COVID-19 declaration under Section 564(b)(1) of the Act, 21 U.S.C. section 360bbb-3(b)(1), unless the authorization is terminated or revoked.  Performed at [facility], [address] [place], [place] [address]     Labs: CBC: Recent Labs  Lab 02/22/24 2058  WBC 5.7  NEUTROABS 4.1  HGB 12.5*  HCT 37.1*  MCV 100.0  PLT 168   Basic Metabolic Panel: Recent Labs  Lab 02/22/24 2058 02/24/24 0231 02/25/24 0023 02/26/24 0228  NA 134* 135 130* 130*  K 5.0 4.3 4.5 4.0  CL 99 95* 93* 93*  CO2 24 28  26 26   GLUCOSE 100* 78 80 78  BUN 27* 30* 35* 37*  CREATININE 1.29* 1.62* 1.71* 1.52*  CALCIUM  8.6* 9.1 8.4* 8.2*  MG  --  1.7 2.3  --   PHOS  --   --   --  3.6   Liver Function Tests: Recent Labs  Lab 02/22/24  2058 02/24/24 0231 02/26/24 0228  AST 32 31  --   ALT 32 30  --   ALKPHOS 116 104  --   BILITOT 0.6 1.2  --   PROT 6.7 6.7  --   ALBUMIN  3.9 3.9 3.6   CBG: No results for input(s): GLUCAP in the last 168 hours.  Discharge time spent: greater than 30 minutes.  Signed: Elidia Toribio Furnace, MD [facility] 02/26/2024 "

## 2024-02-26 NOTE — Telephone Encounter (Signed)
 Spoke with the patient's daughter in regards to her fathers recent hospitalization. She would like to know if he still needs upcoming testing prior to his ablation. Advised that we could cancel the echocardiogram since he had one done in the hospital. Advised that he will still need his CT scan. He will need updated lab work since hospital labs will be more than 30 days out. Advised these could be drawn on the same day that he comes for the CT scan.

## 2024-02-26 NOTE — Telephone Encounter (Signed)
 Pt's daughter is requesting a callback regarding being discharged from hospital today but she does have questions and updates to discuss before his procedure in February. Please advise

## 2024-02-26 NOTE — Progress Notes (Signed)
 Physical Therapy Treatment Patient Details Name: Dylan Roth MRN: [id number] DOB: 17-Apr-1940 Today's Date: 02/26/2024   History of Present Illness 84 y.o. male admitted 02/22/24 with worsening SOB. Workup for CHF, afib, pleural effusion. PMH includes CAD (s/p CABG 2005), PAF, s/p PPM, HTN, HLD.   PT Comments  Pt progressing well with mobility now that R foot pain has resolved. Pt able to ambulate with cane and ascend/descend stairs at supervision-level; no significant DOE noted, pt endorses some fatigue. Pt hopeful for d/c home soon, reports daughters available to provide increased assist. If to remain admitted, will continue to follow acutely to address established goals.     If plan is discharge home, recommend the following: A little help with bathing/dressing/bathroom;Assistance with cooking/housework;Assist for transportation;Help with stairs or ramp for entrance   Can travel by private vehicle     Yes  Equipment Recommendations  None recommended by PT    Recommendations for Other Services       Precautions / Restrictions Precautions Precautions: Fall Recall of Precautions/Restrictions: Intact Restrictions Weight Bearing Restrictions Per Provider Order: No     Mobility  Bed Mobility Overal bed mobility: Independent Bed Mobility: Supine to Sit, Sit to Supine                Transfers Overall transfer level: Modified independent Equipment used: Straight cane               General transfer comment: multiple sit<>stands from EOB and chairs mod indep with his cane    Ambulation/Gait Ambulation/Gait assistance: Supervision Gait Distance (Feet): 500 Feet Assistive device: Straight cane Gait Pattern/deviations: Step-through pattern, Decreased stride length, Trunk flexed Gait velocity: Decreased     General Gait Details: slow, steady gait with cane and supervision for safety   Stairs Stairs: Yes Stairs assistance: Supervision Stair Management: One  rail Left, Forwards, Step to pattern, With cane Number of Stairs: 10 General stair comments: ascend/descend 10 steps with L rail and RUE cane support, initial CGA for safety progressing to supervision; pt opting to do step-to pattern without cues, reinforced this sequencing for fall risk reduction   Wheelchair Mobility     Tilt Bed    Modified Rankin (Stroke Patients Only)       Balance Overall balance assessment: Needs assistance Sitting-balance support: No upper extremity supported, Feet supported Sitting balance-Leahy Scale: Good     Standing balance support: No upper extremity supported, Single extremity supported Standing balance-Leahy Scale: Fair Standing balance comment: can stand and take steps without UE support though unsteady, preference for cane                            Communication Communication Communication: No apparent difficulties  Cognition Arousal: Alert Behavior During Therapy: WFL for tasks assessed/performed   PT - Cognitive impairments: No apparent impairments                         Following commands: Intact      Cueing Cueing Techniques: Verbal cues  Exercises      General Comments General comments (skin integrity, edema, etc.): pt notes R foot pain resolved, plans to go hoem with increased support from daughters. reviewed educ re: role of acute PT, POC, activity recommendations, importance of mobility, fall risk reduction, DME use, potential d/c needs      Pertinent Vitals/Pain Pain Assessment Pain Assessment: No/denies pain Pain Intervention(s): Monitored during session  Home Living                          Prior Function            PT Goals (current goals can now be found in the care plan section) Progress towards PT goals: Progressing toward goals    Frequency    Min 2X/week      PT Plan      Co-evaluation              AM-PAC PT 6 Clicks Mobility   Outcome Measure   Help needed turning from your back to your side while in a flat bed without using bedrails?: None Help needed moving from lying on your back to sitting on the side of a flat bed without using bedrails?: None Help needed moving to and from a bed to a chair (including a wheelchair)?: None Help needed standing up from a chair using your arms (e.g., wheelchair or bedside chair)?: None Help needed to walk in hospital room?: A Little Help needed climbing 3-5 steps with a railing? : A Little 6 Click Score: 22    End of Session Equipment Utilized During Treatment: Gait belt Activity Tolerance: Patient tolerated treatment well Patient left: in chair;with call bell/phone within reach;Other (comment) (nursing staff allowing patient to get up in room without assist) Nurse Communication: Mobility status PT Visit Diagnosis: Unsteadiness on feet (R26.81);Other abnormalities of gait and mobility (R26.89);Muscle weakness (generalized) (M62.81);History of falling (Z91.81)     Time: 8961-8942 PT Time Calculation (min) (ACUTE ONLY): 19 min  Charges:    $Gait Training: 8-22 mins PT General Charges $$ ACUTE PT VISIT: 1 Visit                     Darice Almas, PT, DPT Acute Rehabilitation Services  Personal: Secure Chat Rehab Office: [phone]  Darice LITTIE Almas 02/26/2024, 12:39 PM

## 2024-02-26 NOTE — Progress Notes (Signed)
 "  Heart Failure Stewardship Pharmacist Progress Note   PCP: Ryan Powell HERO., MD PCP-Cardiologist: Lonni Cash, MD    HPI:  84 year old male with PMH significant for CAD s/p CABG (2005), obesity, paroxysmal atrial fibrillation/flutter (s/p DCCV on 02/01/24), sinus node dysfunction s/p PPM, HTN, and HLD who presented on 02/22/24 with a chief complaint of SOB and dyspnea that has been occurring since around Christmas time. He presented because his SOB has worsened to the point that he has to stop several times on his way back from picking up his mail. He denies significant lower extremity swelling, but does feel increased abdominal girth distention. He was seen in the cardiology/EP office ~2 days prior for increase Afib burden. He is currently on amiodarone  to bridge to his ablation that is scheduled for 03/28/24. CXR showed a new right-sided pleural effusion with atelectasis and cardiomegaly. ECHO revealed an LVEF of 50-55%.  Patient is doing well today. He denies SOB and chest pain today. He does not have lower extremity edema on physical exam. He states that his abdomen feels less distended today, but he feels he may benefit from more diuresing. He reports being able to ambulate and was able to go up stairs yesterday with PT/OT without any issues. I saw that he had a 7-day supply of Lasix  20 mg prescribed around 1/6. Patient stated he was taking it daily until it ran out, but didn't notice much difference with his symptoms. We discussed heart failure and his current medications. He stated that he is able to afford the Jardiance  for $47. His daughter reached out to see if he could have his ablation procedure while admitted.   Current HF Medications: Beta Blocker: carvedilol  3.125 mg BID ACE/ARB/ARNI: lisinopril  2.5 mg daily SGLT2i: Jardiance  10 mg daily  Prior to admission HF Medications: Beta blocker: carvedilol  3.125 mg BID ACE/ARB/ARNI: lisinopril  2.5 mg daily  Pertinent Lab  Values: Serum creatinine 1.29>1.59, BUN 37, Potassium 4, Sodium 130, proBNP 1,419, Magnesium  2.3, A1c 5.5% (04/30/2019)  Vital Signs: Weight: 179 lbs (admission weight: 188 lbs) Blood pressure: 90s-100s/60s-80s  Heart rate: 70s (ventricular paced)  I/O: net -0.03 L yesterday; net -3.5 L since admission  Medication Assistance / Insurance Benefits Check: Does the patient have prescription insurance?  Yes Type of insurance plan: [employer] Medicare Part D  Outpatient Pharmacy:  Prior to admission outpatient pharmacy: [facility] Is the patient willing to use [facility] TOC pharmacy at discharge? yes Is the patient willing to transition their outpatient pharmacy to utilize a [facility] outpatient pharmacy? no    Assessment: 1. Acute on chronic diastolic CHF (LVEF 50-55%), due to nonischemic etiology. NYHA class II symptoms. - Minimal lower extremity edema on physical exam, but abdomen mildly distended; reported not drinking anything yesterday - Scr 1.29 > 1.59 (diuresis on hold) > would hold off on MRA for now - BP soft on lisinopril  2.5 mg daily - Continue carvedilol  3.125 mg BID - Continue Jardiance  10 mg daily Plan: 1) Medication changes recommended at this time: - Consider switching lisinopril  to losartan 12.5 mg daily   2) Patient assistance: - Jardiance  copay $47  > affordable per patient  3)  Education  - Patient has been educated on current HF medications and potential additions to HF medication regimen - Patient verbalizes understanding that over the next few months, these medication doses may change and more medications may be added to optimize HF regimen - Patient has been educated on basic disease state pathophysiology  and goals of therapy   B. Maegan Chuck Caban, PharmD PGY-1 Pharmacy Resident [facility] 02/26/2024 9:32 AM    "

## 2024-02-26 NOTE — Telephone Encounter (Addendum)
 Pharmacy Patient Advocate Encounter  Insurance verification completed.    The patient is insured through [employer]. Patient has Medicare and is not eligible for a copay card, but may be able to apply for patient assistance or Medicare RX Payment Plan (Patient Must reach out to their plan, if eligible for payment plan), if available.    Ran test claim for Jardiance  10mg  tablet and the current 30 day co-pay is $47.  Ran test claim for Farxiga 10mg  tablet and the current 30 day co-pay is $47.  This test claim was processed through [employer]- copay amounts may vary at other pharmacies due to [employer], or as the patient moves through the different stages of their insurance plan.

## 2024-02-26 NOTE — Plan of Care (Signed)
   Problem: Education: Goal: Knowledge of General Education information will improve Description: Including pain rating scale, medication(s)/side effects and non-pharmacologic comfort measures Outcome: Progressing   Problem: Clinical Measurements: Goal: Cardiovascular complication will be avoided Outcome: Progressing

## 2024-02-26 NOTE — TOC Transition Note (Signed)
 Transition of Care [facility]) - Discharge Note   Patient Details  Name: Dylan Roth MRN: [id number] Date of Birth: Jul 25, 1940  Transition of [facility]) CM/SW Contact:  Waddell Barnie Rama, RN Phone Number: 02/26/2024, 12:15 PM   Clinical Narrative:    For dc today, has transportation at dc.  NCM notified [facility] of dc.      Barriers to Discharge: Continued Medical Work up   Patient Goals and CMS Choice Patient states their goals for this hospitalization and ongoing recovery are:: Return home CMS Medicare.gov Compare Post Acute Care list provided to:: Patient Choice offered to / list presented to : Patient [place] ownership interest in [facility].provided to:: Patient    Discharge Placement                       Discharge Plan and Services Additional resources added to the After Visit Summary for   In-house Referral: Clinical Social Work Discharge Planning Services: CM Consult Post Acute Care Choice: Home Health                               Social Drivers of Health (SDOH) Interventions SDOH Screenings   Food Insecurity: No Food Insecurity (02/24/2024)  Housing: Low Risk (02/24/2024)  Transportation Needs: No Transportation Needs (02/24/2024)  Utilities: Not At Risk (02/24/2024)  Physical Activity: Unknown (07/03/2022)   Received from [facility]  Social Connections: Unknown (02/24/2024)  Tobacco Use: Medium Risk (02/22/2024)     Readmission Risk Interventions     No data to display

## 2024-02-26 NOTE — Telephone Encounter (Signed)
-----   Message from Nurse Doreatha BROCKS, RN sent at 02/19/2024  3:14 PM EST ----- Regarding: 2/27 afib ablation  Precert:  MD: Kennyth Type of ablation: A-fib Diagnosis: afib CPT code: A-fib (06343) Ablation scheduled (date/time): 2/27 at 1130am  Procedure:  Added to calendar? Yes Orders entered? Yes Letter complete? No, >30 days before procedure Scheduled with cath lab? Yes Any medications to hold? No Labs ordered (CBC, BMET, PT/INR if on warfarin): Yes Mapping system: Doesn't matter CARTO/OPAL rep notified? No Cardiac CT needed? Yes, ordered Dye allergy? No Pre-meds ordered and instructions given? Yes Letter method: MyChart H&P: 1/20 Device: Yes, SJ PPM  Follow-up:  Cassie/Angel, please schedule Routine.  Covering RN - please send this message to [employer], EP scheduler, EP Scheduling pool, EP [employer], and CT scheduler (Brittany Lynch/Stephanie Mogg), if indicated.

## 2024-02-28 NOTE — Progress Notes (Signed)
 Patient presents for  Chief Complaint  Patient presents with   Hospital Follow-Up    Accompanied by Diane, daughter  today.    SUBJECTIVE   Patient: Dylan Roth MRN: [id number] DOB: 1940/12/29  Admit date: 02/22/2024  Discharge date: 02/26/24  Discharge Physician: Elidia Toribio Furnace   PCP: Ryan Powell HERO., MD   Recommendations at discharge:   Patient has been placed on SGLT 2 inh, and continue lisinopril  and carvedilol  for guideline directed medical therapy. To consider adding mineralocorticoid receptor blocker as outpatient if renal function stable.  Loop diuretic with furosemide  20 mg po daily Decreased isosorbide  from 30 mg bid to daily to avoid hypotension  Oral load of amiodarone  400 mg bid for one week and then continue with 200 mg po daily, per EP recommendations.  Plan for atrial fibrillation ablation 03/28/24  Follow up renal function and electrolytes in 7 days as outpatient  Follow up with Dr Ryan in 7 to 10 days Follow up with Cardiology as scheduled   I spoke with patient's daughters at the bedside, we talked in detail about patient's condition, plan of care and prognosis and all questions were addressed.  Discharge Diagnoses: Principal Problem: Acute on chronic diastolic CHF (congestive heart failure) (HCC) Active Problems: CAD (coronary artery disease) HTN (hypertension) Atrial fibrillation/flutter AKI (acute kidney injury) Dyslipidemia BPH (benign prostatic hyperplasia) Gout    Hospital Course: Dylan Roth was admitted to the hospital with the working diagnosis of heart failure exacerbation  84 yo male with the past medical history of coronary artery disease, paroxysmal atrial fibrillation, hypertension, dyslipidemia, and sinus node dysfunction sp pacemaker implantation who presented with dyspnea. 01/20 Patient was found to have increased in atrial fibrillation burden as outpatient, he was placed on amiodarone  and schedule for atrial  fibrillation ablation in February.  Despite antiarrhythmic therapy patient continue to have worsening dyspnea on exertion, to the point where he was having symptoms with minimal efforts.  No lower extremity edema but increased abdominal girth.  Positive 6 lbs weight gain over the last month  On his initial physical examination his blood pressure was 131/69, HR 60 RR 21 and 02 saturation 100%  Lungs with no wheezing or rales, decreased breath sounds at the right base, heart with S1 and S2 present and regular with no gallops, rubs or murmurs, abdomen with no distention and positive trace lower extremity edema.   Na 134, K 5.0 Cl 99 bicarbonate 24 glucose 100 bun 27 cr 1,29  AST 32 ALT 32  Pro BNP 1,419 High sensitive troponin 44 Wbc 5.7 hgb 121.5 plt 168  Influenza negative RSV negative SARS covid 19 negative   Chest radiograph left rotation, with hypoinflation, positive cardiomegaly, bilateral hilar vascular congestion, central interstitial infiltrates, bilateral pleural effusions more right than left.   EKG 61 bpm, left axis deviation, left bundle branch block, qtc 432, atrial and ventricular pacing 100%, with no significant ST segment or T wave changes.   Patient was placed on furosemide  for diuresis   01/25 echocardiogram with preserved LV systolic function with grade II diastolic dysfunction. Improved volume status, holding IV furosemide   01/26 improved volume status, pending renal function to be more stable prior to his discharge.  01/27 renal function improved, plan for discharge home today and follow up as outpatient.   Assessment and Plan: * Acute on chronic diastolic CHF (congestive heart failure) (HCC) Echocardiogram with preserved LV systolic function, EF 50 to 55%, no regional wall motion abnormalities, grade II diastolic dysfunction, with  pseudo normalization, RVSP 26.4 mmHg, RV systolic function mildly reduced, severe dilatation LA and RA, trivial mitral valve regurgitation,  no pericardial effusion   Patient was placed on IV furosemide  for diuresis, negative fluid balance was achieved -3,560 ml with significant improvement in his symptoms.  Systolic blood pressure 100 mmHg range   Continue lisinopril  and carvedilol  Continue with SGLT 2 inh and daily furosemide  20 mg.   TODAY:  Is taking all meds as prescribed - reconciled each med specifically Pt states he's feeling well and has no other issues  Denies SOB, pedal edema, or palpitations.  Prior to his hospitalization he experienced bowel changes, but current bowel habits are back to normal.  Appetite is normal.  Has noted some weight gain since coming home from the hospital (180lbs is the goal weight) States he doesn't take bp at home Diane reported that pt has had some dizzy spells and has had at least one fall but is using cane for support with ambulation  He is waiting on PT/OT with [facility] and home health    New problems/Concerns: none   Allergies[1]  Current Medications[2]  The following portions of the patient's history were reviewed and updated as appropriate: allergies, current medications, past medical history, past social history, past family history, and problem list.   ROS   See HPI.  OBJECTIVE  BP 102/67   Pulse 71   Temp 98.7 F (37.1 C) (Oral)   Resp 16   Wt 83.5 kg (184 lb)   SpO2 97%   BMI 31.34 kg/m   GENERAL APPEARANCE: Well appearing, well developed, no acute distress. HEENT:  Conjunctiva without erythema or drainage. Nasal turbinates are without edema or drainage. Oropharynx is without lesion or exudate.       NECK: Supple without lymphadenopathy, bruit, or thyromegaly. LUNGS: Clear to auscultation bilaterally HEART: Regular rate and rhythm without murmur. ABDOMEN: Normal bowel sounds, soft, non-tender, without hepato-splenomegaly EXTREMITIES: No edema NEUROLOGIC: Alert and oriented x3, no obvious deficits.  SKIN:  No rashes or abnormal lesions visible.    ASSESSMENT/PLAN   1. Acute on chronic diastolic CHF (congestive heart failure)    (CMD) (Primary) Seems to be improved however I did express concern over the weight gain since leaving the hospital.  I'd like him to watch his sodium intake as well as get into the habit of weighing himself each AM along with checking his BP.  I've asked him to keep a log of these numbers and reach out if his weight should increase further so that we can adjust his diuretics accordingly.   Electrolyte drawn today for monitoring of his diuretics in light of his renal insufficiency.  - Basic Metabolic Panel  2. Longstanding persistent atrial fibrillation    (CMD) Rate controlled and will be undergoing ablation soon.  Continue his Amiodarone  load.  Too soon to check thyroid and LFT's at this visit but will need these followed up on at his next OV.   3. Coronary artery disease of native artery of native heart with stable angina pectoris Stable and will continue to follow with the cardiology group out of [facility] hospital  4. Pleural effusion Felt to be due to his CHF - should improve with diuresis.  - Basic Metabolic Panel  5. Hypotension, unspecified hypotension type If Bps continue to be low (less than 100), we will discuss adjusting lisinopril    6. AKI (acute kidney injury) See above.  - Basic Metabolic Panel    Return in about 6  weeks (around 04/10/2024) for LOV, same day labs , HTN, AFib.   This document serves as a record of services personally performed by Powell Colorado, MD.  It was created on their behalf by Ether Rosaline Mosses, CMA, a trained medical scribe, and Certified Medical Assistant (CMA). During the course of documenting the history, physical exam and medical decision making, I was functioning as a stage manager. The creation of this record is the providers dictation and/or activities during the visit.  Electronically signed by Ether Rosaline Mosses, CMA 02/28/2024 1:11 PM  This  document serves as a record of services personally performed by Powell Colorado, MD.  It was created on their behalf by Burnard Karna Gillis, CMA (AAMA), AAS, a trained medical scribe, and Certified Medical Assistant (CMA). During the course of documenting the history, physical exam and medical decision making, I was functioning as a stage manager. The creation of this record is the providers dictation and/or activities during the visit.  Electronically signed by Burnard Karna Gillis, CMA 02/28/2024 1:08 PM    I agree the documentation is accurate and complete.  Electronically signed by: Powell CHRISTELLA Colorado, MD 03/02/2024 2:30 PM         [1] Allergies Allergen Reactions   Sulfa (Sulfonamide Antibiotics) Hives   Sulfamethoxazole Hives   Sulfamethoxazole-Trimethoprim Hives  [2]  Current Outpatient Medications:    acetaminophen  (TYLENOL ) 500 mg tablet, Take 1,000 mg by mouth every 8 (eight) hours., Disp: , Rfl:    allopurinoL  (ZYLOPRIM ) 100 mg tablet, Take 1 tablet (100 mg total) by mouth daily., Disp: 90 tablet, Rfl: 3   amiodarone  (PACERONE ) 200 mg tablet, Take 200 mg by mouth daily., Disp: , Rfl:    amiodarone  (PACERONE ) 200 mg tablet, Take 200 mg by mouth daily. Take (2) tablets daily x1 wk then take (1) tablet daily  New order from [facility], Disp: , Rfl:    apixaban  (Eliquis ) 5 mg tab, Take 5 mg by mouth 2 (two) times a day., Disp: , Rfl:    atorvastatin  (LIPITOR ) 80 mg tablet, TAKE 1 TABLET AT BEDTIME, Disp: 90 tablet, Rfl: 3   Bifidobacterium longum 10 million cell cap, Take 1 capsule by mouth daily., Disp: , Rfl:    carvediloL  (COREG ) 3.125 mg tablet, Take 3.125 mg by mouth 2 (two) times a day with meals., Disp: , Rfl:    cetirizine (ZyrTEC) 10 mg tablet, Take 10 mg by mouth daily. (Patient taking differently: Take 10 mg by mouth daily. As needed), Disp: , Rfl:    empagliflozin  (JARDIANCE ) 10 mg tab, Take 10 mg by mouth daily., Disp: , Rfl:    escitalopram  (LEXAPRO ) 10 mg tablet,  TAKE 1 TABLET EVERY DAY, Disp: 90 tablet, Rfl: 3   furosemide  (LASIX ) 20 mg tablet, Take 1 tablet (20 mg total) by mouth daily., Disp: 7 tablet, Rfl: 0   isosorbide  mononitrate (IMDUR ) 30 mg 24 hr tablet, 2 tabs po qAM (Patient taking differently: 30 mg daily.), Disp: 180 tablet, Rfl: 0   lisinopriL  (PRINIVIL ) 5 mg tablet, Take 1 tablet (5 mg total) by mouth daily., Disp: 90 tablet, Rfl: 3   magnesium  chloride (Slow-Mag) 71.5 mg TbEC tablet, Take 1 tablet (71.5 mg total) by mouth daily., Disp: , Rfl:    nitroglycerin  (NITROSTAT ) 0.4 mg SL tablet, DISSOLVE ONE TABLET UNDER THE TONGUE EVERY 5 MINUTES AS NEEDED FOR CHEST PAIN. DO NOT EXCEED A TOTAL OF 3 DOSES IN 15 MINUTES, Disp: , Rfl:    ranolazine  (RANEXA ) 500 mg 12 hr tablet,  Take 500 mg by mouth 2 (two) times a day., Disp: , Rfl:    tamsulosin  (FLOMAX ) 0.4 mg cap, Take 1 capsule (0.4 mg total) by mouth daily., Disp: 90 capsule, Rfl: 3   sotaloL  (BETAPACE ) 120 mg tablet, Take 120 mg by mouth 2 (two) times a day. (Patient not taking: Reported on 02/28/2024), Disp: , Rfl:

## 2024-02-28 NOTE — Telephone Encounter (Signed)
 Patient had recent admission for CHF exacerbation. At discharge, patient was instructed to change Amiodarone  to 400 mg twice daily for 1 week, then back to 200 mg once daily. He is scheduled for an AF ablation on Feb 27. Do you recommend a follow-up with Afib clinic prior to procedure?

## 2024-02-29 NOTE — Telephone Encounter (Signed)
 Per Dr. Kennyth patient can follow up with afib clinic the week prior to his procedure.

## 2024-03-03 ENCOUNTER — Ambulatory Visit (HOSPITAL_COMMUNITY)

## 2024-03-03 ENCOUNTER — Encounter (HOSPITAL_COMMUNITY): Payer: Self-pay

## 2024-03-03 MED ORDER — LORAZEPAM 0.5 MG PO TABS: ORAL_TABLET | ORAL | 0 refills | Status: DC

## 2024-03-06 ENCOUNTER — Other Ambulatory Visit (HOSPITAL_COMMUNITY): Payer: Self-pay | Admitting: *Deleted

## 2024-03-06 MED ORDER — LORAZEPAM 0.5 MG PO TABS: ORAL_TABLET | ORAL | 0 refills | Status: AC

## 2024-03-06 NOTE — Progress Notes (Signed)
 [facility] post TCM follow up  Date of call: 03/06/24- spoke to pt  Discharged from: Admission Date: 02/22/24 Discharge Date:02/26/24   Institution: [facility]       Diagnosis:   Discharge Diagnoses: Principal Problem:Acute on chronic diastolic CHF (congestive heart failure) (HCC)  Active Problems: CAD (coronary artery disease) HTN (hypertension) Atrial fibrillation/flutter AKI (acute kidney injury) Dyslipidemia BPH (benign prostatic hyperplasia) Gout   Updates/Changes since last encounter: Pt reports doing well since DC. Wt today 180, BP 123/73, HR 69. Pt denies swelling, sob, weakness. Pt reports has planned ablation 2/27. ACN reviewed abn s/sx and pt denies. Pt aware to call with concerns and/or seek medical care. ACN will follow up for 30d call.  Current Questions/Concerns: none  Is patient candidate for Navigation: ACN will follow up for 30d call.  Collier Mulch, RN  Ambulatory Care Navigator [phone]   Electronically signed by: Collier Stacia Mulch, RN 03/06/2024 1:07 PM

## 2024-03-06 NOTE — Progress Notes (Signed)
 "  Primary Care Physician: Spry, Heather M., MD Primary Cardiologist: Lonni Cash, MD Electrophysiologist: Fonda Kitty, MD  Referring Physician: Fonda Kitty, MD  Dylan Roth is a 84 y.o. male with a history of CAD s/p CABG x 5 2005, HTN, HLD, obesity, paroxysmal AF/flutter sinus node dysfunction s/p PPM  who presents for follow up in the [facility].   Dylan Roth was last seen on 01/11/2024 and was scheduled for DCCV after transitioning from sotalol  to amiodarone  but chemically converted.  He had amiodarone  decreased to 200 mg daily.  He was seen in follow-up by Artist Pouch, PA on 01/28/2024 and reported ongoing exertional dyspnea.  He had device interrogated that showed 28% AF burden that appeared on 01/26/2024 and underwent DCCV to restore rhythm that was completed on 02/01/2024.  He was also referred to Dr. Kitty to discuss ablation on 02/19/2024 and was found to be a candidate with advisement to continue amiodarone  as a bridge to  ablation that is scheduled on 03/27/2024.  He presented to the ED on 02/22/2024 with increased shortness of breath that he reported being worse since Christmas.  BNP was 1,419 and chest x-ray revealed bilateral pleural effusions.  He was started on Lasix  drip and had GDMT tuned up with -3,560 ml with significant improvement in his symptoms. Patient presents today for follow up for atrial fibrillation. ***   ***Discussed the use of AI scribe software for clinical note transcription with the patient, who gave verbal consent to proceed.   Notes: ***  Atrial Fibrillation Management history: History of Sleep Apnea*** History of alcohol use*** History of early familial atrial fibrillation or other arrhythmias *** Previous antiarrhythmic drugs: *** Previous cardioversions: *** Previous ablations: *** Anticoagulation history: ***  ROS- All systems are reviewed and negative except as per the HPI above.  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 80% left main, 80% LAD, 90% ramus, 50% circ, 90% in nondominant RCA,  EF normal  2010--echo   Cataract    bilateral-removed 6-7 years ago   Central obesity    Chronic back pain    Dysrhythmia    Erectile dysfunction    GERD (gastroesophageal reflux disease)    pt. denies   Gout    once; I have it under control w/medicine   History of anemia    History of colon polyps    Hyperlipidemia    Hypertension    IBS (irritable bowel syndrome)    with diarrhea   Myocardial infarction (HCC)    pt. denies at preop   Pacemaker    Palpitations    Sinus node dysfunction (HCC)    post termination pauses <5sec   Past Surgical History:  Procedure Laterality Date   CARDIAC CATHETERIZATION  2005   that what sent me to CABG   CARDIAC CATHETERIZATION N/A 09/22/2015   Procedure: Left Heart Cath and Cors/Grafts Angiography;  Surgeon: Lonni JONETTA Cash, MD;  Location: [facility] INVASIVE CV LAB;  Service: Cardiovascular;  Laterality: N/A;   CATARACT EXTRACTION W/ INTRAOCULAR LENS  IMPLANT, BILATERAL  ~ 01/2011   CHOLECYSTECTOMY  1978    COLONOSCOPY  2014   (Polypectomy) colonic polyps status post polypectomy. Pancolonic diverticulosis predominantly in the sigmoid colon. Small internal hemorrhoids. Patient also  had perirectal excoriation   CORONARY ARTERY BYPASS GRAFT  2005   in [place]  - LIMA to LAD, SVG to diagonal, SVG to ramus, SVG to OM and SVG to RCA    EYE SURGERY     retina surgey right eye in [place]   PACEMAKER INSERTION  2014   PERMANENT PACEMAKER INSERTION N/A 05/25/2011   Procedure: PERMANENT PACEMAKER INSERTION;  Surgeon: Elspeth JAYSON Sage, MD;  Location: [facility] CATH LAB;  Service: Cardiovascular;  Laterality: N/A;   PPM GENERATOR CHANGEOUT N/A 02/23/2021   Procedure: PPM GENERATOR CHANGEOUT;   Surgeon: Sage Elspeth JAYSON, MD;  Location: [facility] INVASIVE CV LAB;  Service: Cardiovascular;  Laterality: N/A;   TOTAL KNEE ARTHROPLASTY Right 12/16/2018   Procedure: TOTAL KNEE ARTHROPLASTY;  Surgeon: Rubie Kemps, MD;  Location: [facility] ORS;  Service: Orthopedics;  Laterality: Right;  75 mins needed for length of case   TOTAL KNEE ARTHROPLASTY Left 01/01/2023   Procedure: TOTAL KNEE ARTHROPLASTY;  Surgeon: Rubie Kemps, MD;  Location: [facility];  Service: Orthopedics;  Laterality: Left;   TRANSURETHRAL RESECTION OF PROSTATE N/A 12/29/2013   Procedure: TRANSURETHRAL RESECTION OF THE PROSTATE WITH GYRUS INSTRUMENTS;  Surgeon: Alm GORMAN Fragmin, MD;  Location: [facility] ORS;  Service: Urology;  Laterality: N/A;   Sulfa antibiotics, Sulfamethoxazole, and Sulfamethoxazole-trimethoprim Current Outpatient Medications  Medication Sig Dispense Refill   acetaminophen  (TYLENOL ) 500 MG tablet Take 1,000 mg by mouth every 6 (six) hours as needed for mild pain (pain score 1-3).     allopurinol  (ZYLOPRIM ) 100 MG tablet Take 100 mg by mouth in the morning.     amiodarone  (PACERONE ) 200 MG tablet Take 2 tablets twice daily for one week through February 3rd, then continue with one tablet daily starting on February 4th. 51 tablet 0   apixaban  (ELIQUIS ) 5 MG TABS tablet TAKE 1 TABLET TWICE DAILY 180 tablet 2   atorvastatin  (LIPITOR ) 80 MG tablet Take 80 mg by mouth at bedtime.     carvedilol  (COREG ) 3.125 MG tablet TAKE 1 TABLET TWICE DAILY WITH MEALS 180 tablet 1   diclofenac  Sodium (VOLTAREN ) 1 % GEL Apply 2 g topically 4 (four) times daily as needed (joint pain).     empagliflozin  (JARDIANCE ) 10 MG TABS tablet Take 1 tablet (10 mg total) by mouth daily. 30 tablet 0   escitalopram  (LEXAPRO ) 10 MG tablet Take 10 mg by mouth at bedtime.     furosemide  (LASIX ) 20 MG tablet Take 1 tablet (20 mg total) by mouth daily. 30 tablet 0   isosorbide  mononitrate (IMDUR ) 30 MG 24 hr tablet Take 1 tablet (30 mg total) by mouth daily.     lidocaine   (SALONPAS PAIN RELIEVING) 4 % Place 1 patch onto the skin daily as needed (shoulder pain).     lisinopril  (ZESTRIL ) 5 MG tablet Take 2.5 mg by mouth daily.     LORazepam  (ATIVAN ) 0.5 MG tablet Take one tablet by mouth 1 hour prior to scheduled CT scan. 1 tablet 0   nitroGLYCERIN  (NITROSTAT ) 0.4 MG SL tablet DISSOLVE 1 TABLET UNDER THE TONGUE EVERY 5 MINUTES AS NEEDED FOR CHEST PAIN. DO NOT EXCEED A TOTAL OF 3 DOSES IN 15 MINUTES 75 tablet 3   ranolazine  (RANEXA ) 500 MG 12 hr tablet TAKE 1 TABLET TWICE DAILY 180 tablet 1   tamsulosin  (FLOMAX ) 0.4 MG CAPS capsule Take 0.4 mg by mouth in the morning.     No current facility-administered medications for this visit.    Physical Exam: There were no vitals taken for  this visit.  GEN: Well nourished, well developed in no acute distress NECK: No JVD; No carotid bruits CARDIAC: {EPRHYTHM:28826}, no murmurs, rubs, gallops RESPIRATORY:  Clear to auscultation without rales, wheezing or rhonchi  ABDOMEN: Soft, non-tender, non-distended EXTREMITIES:  No edema; No deformity   Wt Readings from Last 3 Encounters:  02/26/24 81.5 kg  02/19/24 85.3 kg  01/28/24 86.8 kg    Lab Results  Component Value Date   TSH 1.98 12/18/2007   EKG today demonstrates:   EKG Interpretation Date/Time:    Ventricular Rate:    PR Interval:    QRS Duration:    QT Interval:    QTC Calculation:   R Axis:      Text Interpretation:          Echo Completed ***  CHA2DS2-VASc Score = 4  The patient's score is based upon: CHF History: 0 HTN History: 1 Diabetes History: 0 Stroke History: 0 Vascular Disease History: 1 Age Score: 2 Gender Score: 0   {Confirm score is correct.  If not, click here to update score.  REFRESH note.  :1}    ASSESSMENT AND PLAN: Paroxysmal Atrial Fibrillation (ICD10:  I48.0) The patient's CHA2DS2-VASc score is 4, indicating a 4.8% annual risk of stroke.  ***  Secondary Hypercoagulable State (ICD10:  D68.69){Click to add to Prob  List or Visit Dx  :789639253} The patient is at significant risk for stroke/thromboembolism based upon his CHA2DS2-VASc Score of 4.  {Anticoag or No Anticoag  :[id number]}   HFpEF: -2D Echo completed and showed EF 50-55% with no RWMA and grade 2 DD with severe biatrial enlargement and trivial MVR - Admitted on 02/22/2024 with increased shortness of breath and treated with IV Lasix  and GDMT -Today patient is***   Sinus node dysfunction: S/p PPM, followed by Dr Kennyth   CAD S/p CABG 2005 No anginal symptoms Followed by Dr Verlin   Signed,  Wyn Raddle, Jackee Shove, NP    03/06/2024 3:37 PM    {Are you ordering a CV Procedure (e.g. stress test, cath, DCCV, TEE, etc)?   Press F2        :[id number]}  Do not forget to refresh clinic note for EKG  Follow up with the AF Clinic in ***  {Click to Open Review  :1}  "

## 2024-03-07 ENCOUNTER — Encounter (HOSPITAL_COMMUNITY): Payer: Self-pay | Admitting: Nurse Practitioner

## 2024-03-07 ENCOUNTER — Ambulatory Visit (HOSPITAL_COMMUNITY)
Admission: RE | Admit: 2024-03-07 | Source: Ambulatory Visit | Attending: Nurse Practitioner | Admitting: Nurse Practitioner

## 2024-03-07 ENCOUNTER — Ambulatory Visit (HOSPITAL_COMMUNITY): Payer: Self-pay | Admitting: Nurse Practitioner

## 2024-03-07 ENCOUNTER — Ambulatory Visit (HOSPITAL_COMMUNITY): Admission: RE | Admit: 2024-03-07 | Source: Ambulatory Visit | Attending: Cardiology | Admitting: Cardiology

## 2024-03-07 ENCOUNTER — Ambulatory Visit (HOSPITAL_COMMUNITY): Admission: RE | Admit: 2024-03-07 | Admitting: Nurse Practitioner

## 2024-03-07 VITALS — BP 104/70 | HR 60 | Ht 67.0 in | Wt 186.0 lb

## 2024-03-07 DIAGNOSIS — D6869 Other thrombophilia: Secondary | ICD-10-CM

## 2024-03-07 DIAGNOSIS — I4819 Other persistent atrial fibrillation: Secondary | ICD-10-CM

## 2024-03-07 DIAGNOSIS — R296 Repeated falls: Secondary | ICD-10-CM

## 2024-03-07 DIAGNOSIS — I25119 Atherosclerotic heart disease of native coronary artery with unspecified angina pectoris: Secondary | ICD-10-CM

## 2024-03-07 DIAGNOSIS — I495 Sick sinus syndrome: Secondary | ICD-10-CM

## 2024-03-07 DIAGNOSIS — I2510 Atherosclerotic heart disease of native coronary artery without angina pectoris: Secondary | ICD-10-CM

## 2024-03-07 DIAGNOSIS — Z950 Presence of cardiac pacemaker: Secondary | ICD-10-CM

## 2024-03-07 DIAGNOSIS — Z5181 Encounter for therapeutic drug level monitoring: Secondary | ICD-10-CM

## 2024-03-07 MED ORDER — IOHEXOL 350 MG/ML SOLN
80.0000 mL | Freq: Once | INTRAVENOUS | Status: AC | PRN
  Administered 2024-03-07: 80 mL via INTRAVENOUS

## 2024-03-07 NOTE — Patient Instructions (Addendum)
 Before you leave today -  You will have a cardiac ct, head ct and chest xray  Stop at the lab on your way out of the building - 1st floor

## 2024-03-17 ENCOUNTER — Ambulatory Visit (HOSPITAL_COMMUNITY): Admitting: Nurse Practitioner

## 2024-03-28 ENCOUNTER — Ambulatory Visit (HOSPITAL_COMMUNITY): Admit: 2024-03-28 | Admitting: Cardiology

## 2024-03-28 ENCOUNTER — Encounter (HOSPITAL_COMMUNITY): Payer: Self-pay

## 2024-03-31 ENCOUNTER — Ambulatory Visit (HOSPITAL_COMMUNITY): Admitting: Nurse Practitioner

## 2024-04-24 ENCOUNTER — Ambulatory Visit (HOSPITAL_COMMUNITY): Admitting: Nurse Practitioner

## 2024-05-21 ENCOUNTER — Ambulatory Visit

## 2024-08-20 ENCOUNTER — Ambulatory Visit

## 2024-11-19 ENCOUNTER — Ambulatory Visit

## 2025-02-18 ENCOUNTER — Ambulatory Visit
